# Patient Record
Sex: Female | Born: 1945
Health system: Southern US, Community
[De-identification: ages and names within clinical notes are randomized; demographics above are authoritative.]

## PROBLEM LIST (undated history)

## (undated) DIAGNOSIS — D124 Benign neoplasm of descending colon: Secondary | ICD-10-CM

## (undated) DIAGNOSIS — K8020 Calculus of gallbladder without cholecystitis without obstruction: Secondary | ICD-10-CM

## (undated) DIAGNOSIS — I5032 Chronic diastolic (congestive) heart failure: Secondary | ICD-10-CM

## (undated) DIAGNOSIS — K253 Acute gastric ulcer without hemorrhage or perforation: Secondary | ICD-10-CM

## (undated) DIAGNOSIS — T8859XA Other complications of anesthesia, initial encounter: Secondary | ICD-10-CM

## (undated) DIAGNOSIS — I5189 Other ill-defined heart diseases: Secondary | ICD-10-CM

## (undated) DIAGNOSIS — K429 Umbilical hernia without obstruction or gangrene: Secondary | ICD-10-CM

## (undated) DIAGNOSIS — Z8719 Personal history of other diseases of the digestive system: Secondary | ICD-10-CM

## (undated) DIAGNOSIS — I428 Other cardiomyopathies: Secondary | ICD-10-CM

## (undated) DIAGNOSIS — E669 Obesity, unspecified: Secondary | ICD-10-CM

## (undated) DIAGNOSIS — M1990 Unspecified osteoarthritis, unspecified site: Secondary | ICD-10-CM

## (undated) DIAGNOSIS — T753XXA Motion sickness, initial encounter: Secondary | ICD-10-CM

## (undated) DIAGNOSIS — R7611 Nonspecific reaction to tuberculin skin test without active tuberculosis: Secondary | ICD-10-CM

## (undated) DIAGNOSIS — M8580 Other specified disorders of bone density and structure, unspecified site: Secondary | ICD-10-CM

## (undated) DIAGNOSIS — I509 Heart failure, unspecified: Secondary | ICD-10-CM

## (undated) DIAGNOSIS — R112 Nausea with vomiting, unspecified: Secondary | ICD-10-CM

## (undated) DIAGNOSIS — I447 Left bundle-branch block, unspecified: Secondary | ICD-10-CM

## (undated) DIAGNOSIS — N63 Unspecified lump in unspecified breast: Secondary | ICD-10-CM

## (undated) DIAGNOSIS — I340 Nonrheumatic mitral (valve) insufficiency: Secondary | ICD-10-CM

## (undated) DIAGNOSIS — Z87891 Personal history of nicotine dependence: Secondary | ICD-10-CM

## (undated) DIAGNOSIS — I10 Essential (primary) hypertension: Secondary | ICD-10-CM

## (undated) DIAGNOSIS — Z9889 Other specified postprocedural states: Secondary | ICD-10-CM

## (undated) DIAGNOSIS — M349 Systemic sclerosis, unspecified: Secondary | ICD-10-CM

## (undated) DIAGNOSIS — I351 Nonrheumatic aortic (valve) insufficiency: Secondary | ICD-10-CM

## (undated) DIAGNOSIS — Z803 Family history of malignant neoplasm of breast: Secondary | ICD-10-CM

## (undated) DIAGNOSIS — T4145XA Adverse effect of unspecified anesthetic, initial encounter: Secondary | ICD-10-CM

## (undated) HISTORY — DX: Nonspecific reaction to tuberculin skin test without active tuberculosis: R76.11

## (undated) HISTORY — DX: Unspecified lump in unspecified breast: N63.0

## (undated) HISTORY — DX: Obesity, unspecified: E66.9

## (undated) HISTORY — DX: Personal history of nicotine dependence: Z87.891

## (undated) HISTORY — DX: Systemic sclerosis, unspecified: M34.9

## (undated) HISTORY — PX: ERCP W/ SPHICTEROTOMY: SHX1523

## (undated) HISTORY — DX: Other specified disorders of bone density and structure, unspecified site: M85.80

## (undated) HISTORY — DX: Family history of malignant neoplasm of breast: Z80.3

## (undated) HISTORY — DX: Essential (primary) hypertension: I10

---
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
## 1966-11-29 HISTORY — PX: TONSILLECTOMY: SUR1361

## 1972-11-29 HISTORY — PX: TUBAL LIGATION: SHX77

## 2004-06-29 HISTORY — PX: COLONOSCOPY: SHX174

## 2004-06-29 LAB — HM COLONOSCOPY: HM Colonoscopy: NORMAL

## 2005-06-09 ENCOUNTER — Ambulatory Visit: Payer: Self-pay | Admitting: Obstetrics and Gynecology

## 2006-03-04 LAB — FECAL OCCULT BLOOD, GUAIAC: Fecal Occult Blood: NEGATIVE

## 2006-03-30 ENCOUNTER — Ambulatory Visit: Payer: Self-pay | Admitting: Family Medicine

## 2006-06-10 ENCOUNTER — Ambulatory Visit: Payer: Self-pay | Admitting: Family Medicine

## 2006-07-04 ENCOUNTER — Ambulatory Visit: Payer: Self-pay | Admitting: Family Medicine

## 2006-07-04 ENCOUNTER — Other Ambulatory Visit: Admission: RE | Admit: 2006-07-04 | Discharge: 2006-07-04 | Payer: Self-pay | Admitting: Family Medicine

## 2006-07-04 ENCOUNTER — Encounter: Payer: Self-pay | Admitting: Family Medicine

## 2006-07-04 LAB — CONVERTED CEMR LAB: Pap Smear: NORMAL

## 2006-08-04 ENCOUNTER — Ambulatory Visit: Payer: Self-pay | Admitting: Family Medicine

## 2006-09-16 ENCOUNTER — Ambulatory Visit: Payer: Self-pay | Admitting: Family Medicine

## 2007-06-27 ENCOUNTER — Encounter: Payer: Self-pay | Admitting: Family Medicine

## 2007-06-27 DIAGNOSIS — M810 Age-related osteoporosis without current pathological fracture: Secondary | ICD-10-CM | POA: Insufficient documentation

## 2007-06-27 DIAGNOSIS — F17200 Nicotine dependence, unspecified, uncomplicated: Secondary | ICD-10-CM | POA: Insufficient documentation

## 2007-06-27 HISTORY — DX: Age-related osteoporosis without current pathological fracture: M81.0

## 2007-07-17 ENCOUNTER — Ambulatory Visit: Payer: Self-pay | Admitting: Family Medicine

## 2007-08-10 ENCOUNTER — Ambulatory Visit: Payer: Self-pay | Admitting: Family Medicine

## 2007-08-10 ENCOUNTER — Encounter: Payer: Self-pay | Admitting: Family Medicine

## 2007-08-14 ENCOUNTER — Encounter (INDEPENDENT_AMBULATORY_CARE_PROVIDER_SITE_OTHER): Payer: Self-pay | Admitting: *Deleted

## 2007-09-01 ENCOUNTER — Telehealth: Payer: Self-pay | Admitting: Family Medicine

## 2007-09-07 ENCOUNTER — Ambulatory Visit: Payer: Self-pay | Admitting: Oncology

## 2007-09-18 ENCOUNTER — Encounter: Payer: Self-pay | Admitting: Family Medicine

## 2007-09-18 LAB — COMPREHENSIVE METABOLIC PANEL
ALT: 23 U/L (ref 0–35)
AST: 25 U/L (ref 0–37)
Albumin: 4 g/dL (ref 3.5–5.2)
Alkaline Phosphatase: 67 U/L (ref 39–117)
BUN: 10 mg/dL (ref 6–23)
CO2: 31 mEq/L (ref 19–32)
Calcium: 9.9 mg/dL (ref 8.4–10.5)
Chloride: 103 mEq/L (ref 96–112)
Creatinine, Ser: 0.76 mg/dL (ref 0.40–1.20)
Glucose, Bld: 98 mg/dL (ref 70–99)
Potassium: 3.9 mEq/L (ref 3.5–5.3)
Sodium: 141 mEq/L (ref 135–145)
Total Bilirubin: 0.6 mg/dL (ref 0.3–1.2)
Total Protein: 6.8 g/dL (ref 6.0–8.3)

## 2007-09-18 LAB — CBC WITH DIFFERENTIAL (CANCER CENTER ONLY)
BASO#: 0 10*3/uL (ref 0.0–0.2)
BASO%: 0.6 % (ref 0.0–2.0)
EOS%: 1.3 % (ref 0.0–7.0)
Eosinophils Absolute: 0.1 10*3/uL (ref 0.0–0.5)
HCT: 40.8 % (ref 34.8–46.6)
HGB: 13.8 g/dL (ref 11.6–15.9)
LYMPH#: 2 10*3/uL (ref 0.9–3.3)
LYMPH%: 40 % (ref 14.0–48.0)
MCH: 31.5 pg (ref 26.0–34.0)
MCHC: 33.9 g/dL (ref 32.0–36.0)
MCV: 93 fL (ref 81–101)
MONO#: 0.3 10*3/uL (ref 0.1–0.9)
MONO%: 6.4 % (ref 0.0–13.0)
NEUT#: 2.6 10*3/uL (ref 1.5–6.5)
NEUT%: 51.7 % (ref 39.6–80.0)
Platelets: 243 10*3/uL (ref 145–400)
RBC: 4.38 10*6/uL (ref 3.70–5.32)
RDW: 12.7 % (ref 10.5–14.6)
WBC: 5 10*3/uL (ref 3.9–10.0)

## 2007-09-22 LAB — HYPERCOAGULABLE PANEL, COMPREHENSIVE
AntiThromb III Func: 129 % — ABNORMAL HIGH (ref 76–126)
Anticardiolipin IgA: 16 [APL'U] (ref <13)
Anticardiolipin IgG: <7 [GPL'U] (ref <11)
Anticardiolipin IgM: 10 [MPL'U] (ref <10)
Beta-2 Glyco I IgG: <4 U/mL (ref <20)
Beta-2-Glycoprotein I IgA: <4 U/mL (ref <10)
Beta-2-Glycoprotein I IgM: <4 U/mL (ref <10)
DRVVT: 42.2 secs (ref 36.1–47.0)
Homocysteine: 6.1 umol/L (ref 4.0–15.4)
PTT Lupus Anticoagulant: 38.7 secs (ref 36.3–48.8)
Protein C Activity: 168 % — ABNORMAL HIGH (ref 75–133)
Protein C, Total: 104 % (ref 70–140)
Protein S Activity: 99 % (ref 69–129)
Protein S Ag, Total: 137 % (ref 70–140)

## 2007-10-10 ENCOUNTER — Encounter: Payer: Self-pay | Admitting: Family Medicine

## 2007-10-13 LAB — ANTITHROMBIN III: AntiThromb III Func: 120 % (ref 76–126)

## 2007-10-13 LAB — CARDIOLIPIN ANTIBODIES, IGG, IGM, IGA
Anticardiolipin IgA: 8 [APL'U] (ref <13)
Anticardiolipin IgG: <7 [GPL'U] (ref <11)
Anticardiolipin IgM: 11 [MPL'U] (ref <10)

## 2007-10-13 LAB — PROTEIN C ACTIVITY: Protein C Activity: 141 % — ABNORMAL HIGH (ref 75–133)

## 2007-12-08 ENCOUNTER — Telehealth: Payer: Self-pay | Admitting: Family Medicine

## 2007-12-12 ENCOUNTER — Ambulatory Visit: Payer: Self-pay | Admitting: Family Medicine

## 2008-07-30 ENCOUNTER — Ambulatory Visit: Payer: Self-pay | Admitting: Family Medicine

## 2008-07-30 DIAGNOSIS — L909 Atrophic disorder of skin, unspecified: Secondary | ICD-10-CM | POA: Insufficient documentation

## 2008-07-30 DIAGNOSIS — L919 Hypertrophic disorder of the skin, unspecified: Secondary | ICD-10-CM

## 2008-07-31 ENCOUNTER — Encounter: Payer: Self-pay | Admitting: Family Medicine

## 2008-08-01 LAB — CONVERTED CEMR LAB
ALT: 17 units/L (ref 0–35)
AST: 21 units/L (ref 0–37)
Albumin: 3.9 g/dL (ref 3.5–5.2)
Alkaline Phosphatase: 79 units/L (ref 39–117)
BUN: 18 mg/dL (ref 6–23)
Basophils Absolute: 0 10*3/uL (ref 0.0–0.1)
Basophils Relative: 0.3 % (ref 0.0–3.0)
Bilirubin, Direct: 0.1 mg/dL (ref 0.0–0.3)
CO2: 31 meq/L (ref 19–32)
Calcium: 9.1 mg/dL (ref 8.4–10.5)
Chloride: 110 meq/L (ref 96–112)
Cholesterol: 209 mg/dL (ref 0–200)
Creatinine, Ser: 0.8 mg/dL (ref 0.4–1.2)
Direct LDL: 100.2 mg/dL
Eosinophils Absolute: 0.1 10*3/uL (ref 0.0–0.7)
Eosinophils Relative: 1.1 % (ref 0.0–5.0)
GFR calc Af Amer: 94 mL/min
GFR calc non Af Amer: 78 mL/min
Glucose, Bld: 93 mg/dL (ref 70–99)
HCT: 42.3 % (ref 36.0–46.0)
HDL: 62.5 mg/dL (ref >39.0)
Hemoglobin: 14.9 g/dL (ref 12.0–15.0)
Lymphocytes Relative: 29.9 % (ref 12.0–46.0)
MCHC: 35.2 g/dL (ref 30.0–36.0)
MCV: 93.5 fL (ref 78.0–100.0)
Monocytes Absolute: 0.5 10*3/uL (ref 0.1–1.0)
Monocytes Relative: 8.2 % (ref 3.0–12.0)
Neutro Abs: 3.5 10*3/uL (ref 1.4–7.7)
Neutrophils Relative %: 60.5 % (ref 43.0–77.0)
Platelets: 263 10*3/uL (ref 150–400)
Potassium: 4 meq/L (ref 3.5–5.1)
RBC: 4.53 M/uL (ref 3.87–5.11)
RDW: 13 % (ref 11.5–14.6)
Sodium: 143 meq/L (ref 135–145)
TSH: 0.93 microintl units/mL (ref 0.35–5.50)
Total Bilirubin: 0.7 mg/dL (ref 0.3–1.2)
Total CHOL/HDL Ratio: 3.3
Total Protein: 6.8 g/dL (ref 6.0–8.3)
Triglycerides: 53 mg/dL (ref 0–149)
VLDL: 11 mg/dL (ref 0–40)
Vit D, 1,25-Dihydroxy: 33 (ref 30–89)
WBC: 5.9 10*3/uL (ref 4.5–10.5)

## 2008-08-15 ENCOUNTER — Encounter: Payer: Self-pay | Admitting: Family Medicine

## 2008-08-15 ENCOUNTER — Ambulatory Visit: Payer: Self-pay | Admitting: Family Medicine

## 2008-11-29 HISTORY — PX: BUNIONECTOMY: SHX129

## 2009-08-19 ENCOUNTER — Encounter: Payer: Self-pay | Admitting: Family Medicine

## 2009-08-19 ENCOUNTER — Ambulatory Visit: Payer: Self-pay | Admitting: Family Medicine

## 2009-08-19 ENCOUNTER — Other Ambulatory Visit: Admission: RE | Admit: 2009-08-19 | Discharge: 2009-08-19 | Payer: Self-pay | Admitting: Family Medicine

## 2009-08-20 LAB — CONVERTED CEMR LAB
ALT: 17 units/L (ref 0–35)
AST: 21 units/L (ref 0–37)
Albumin: 3.9 g/dL (ref 3.5–5.2)
Alkaline Phosphatase: 72 units/L (ref 39–117)
BUN: 16 mg/dL (ref 6–23)
Basophils Absolute: 0 10*3/uL (ref 0.0–0.1)
Basophils Relative: 0.2 % (ref 0.0–3.0)
Bilirubin, Direct: 0 mg/dL (ref 0.0–0.3)
CO2: 34 meq/L — ABNORMAL HIGH (ref 19–32)
Calcium: 9.5 mg/dL (ref 8.4–10.5)
Chloride: 106 meq/L (ref 96–112)
Cholesterol: 173 mg/dL (ref 0–200)
Creatinine, Ser: 0.9 mg/dL (ref 0.4–1.2)
Eosinophils Absolute: 0.1 10*3/uL (ref 0.0–0.7)
Eosinophils Relative: 0.8 % (ref 0.0–5.0)
GFR calc non Af Amer: 67.27 mL/min (ref >60)
Glucose, Bld: 68 mg/dL — ABNORMAL LOW (ref 70–99)
HCT: 41.1 % (ref 36.0–46.0)
HDL: 52.1 mg/dL (ref >39.00)
Hemoglobin: 14.2 g/dL (ref 12.0–15.0)
LDL Cholesterol: 108 mg/dL — ABNORMAL HIGH (ref 0–99)
Lymphocytes Relative: 29 % (ref 12.0–46.0)
Lymphs Abs: 1.9 10*3/uL (ref 0.7–4.0)
MCHC: 34.4 g/dL (ref 30.0–36.0)
MCV: 96.3 fL (ref 78.0–100.0)
Monocytes Absolute: 0.5 10*3/uL (ref 0.1–1.0)
Monocytes Relative: 8.2 % (ref 3.0–12.0)
Neutro Abs: 4 10*3/uL (ref 1.4–7.7)
Neutrophils Relative %: 61.8 % (ref 43.0–77.0)
Platelets: 218 10*3/uL (ref 150.0–400.0)
Potassium: 4.2 meq/L (ref 3.5–5.1)
RBC: 4.27 M/uL (ref 3.87–5.11)
RDW: 12.4 % (ref 11.5–14.6)
Sodium: 144 meq/L (ref 135–145)
TSH: 0.94 microintl units/mL (ref 0.35–5.50)
Total Bilirubin: 0.8 mg/dL (ref 0.3–1.2)
Total CHOL/HDL Ratio: 3
Total Protein: 6.9 g/dL (ref 6.0–8.3)
Triglycerides: 67 mg/dL (ref 0.0–149.0)
VLDL: 13.4 mg/dL (ref 0.0–40.0)
Vit D, 25-Hydroxy: 40 ng/mL (ref 30–89)
WBC: 6.5 10*3/uL (ref 4.5–10.5)

## 2009-08-21 ENCOUNTER — Encounter (INDEPENDENT_AMBULATORY_CARE_PROVIDER_SITE_OTHER): Payer: Self-pay | Admitting: *Deleted

## 2009-08-27 ENCOUNTER — Ambulatory Visit: Payer: Self-pay | Admitting: Family Medicine

## 2009-08-27 ENCOUNTER — Encounter: Payer: Self-pay | Admitting: Family Medicine

## 2009-08-27 LAB — HM MAMMOGRAPHY: HM Mammogram: NEGATIVE

## 2009-08-28 ENCOUNTER — Encounter (INDEPENDENT_AMBULATORY_CARE_PROVIDER_SITE_OTHER): Payer: Self-pay | Admitting: *Deleted

## 2010-01-09 ENCOUNTER — Encounter: Payer: Self-pay | Admitting: Family Medicine

## 2010-03-11 ENCOUNTER — Telehealth: Payer: Self-pay | Admitting: Family Medicine

## 2010-07-31 ENCOUNTER — Encounter: Payer: Self-pay | Admitting: Family Medicine

## 2010-08-20 ENCOUNTER — Ambulatory Visit: Payer: Self-pay | Admitting: Family Medicine

## 2010-08-29 LAB — HM DEXA SCAN

## 2010-09-15 ENCOUNTER — Ambulatory Visit: Payer: Self-pay | Admitting: Family Medicine

## 2010-09-15 ENCOUNTER — Encounter: Payer: Self-pay | Admitting: Family Medicine

## 2010-09-15 DIAGNOSIS — R928 Other abnormal and inconclusive findings on diagnostic imaging of breast: Secondary | ICD-10-CM | POA: Insufficient documentation

## 2010-09-28 ENCOUNTER — Ambulatory Visit: Payer: Self-pay | Admitting: Family Medicine

## 2010-09-28 ENCOUNTER — Encounter: Payer: Self-pay | Admitting: Family Medicine

## 2010-09-29 HISTORY — PX: BREAST BIOPSY: SHX20

## 2010-10-02 ENCOUNTER — Encounter: Payer: Self-pay | Admitting: Family Medicine

## 2010-10-02 ENCOUNTER — Telehealth: Payer: Self-pay | Admitting: Family Medicine

## 2010-11-02 ENCOUNTER — Encounter: Payer: Self-pay | Admitting: Family Medicine

## 2010-11-04 ENCOUNTER — Encounter: Payer: Self-pay | Admitting: Family Medicine

## 2010-11-05 ENCOUNTER — Ambulatory Visit: Payer: Self-pay | Admitting: General Surgery

## 2010-11-12 ENCOUNTER — Encounter: Payer: Self-pay | Admitting: Family Medicine

## 2010-12-29 NOTE — Letter (Signed)
Summary: Generic Letter  [facility]  [address] [place], [place] [address]   Phone: [phone]  Fax: [phone]    08/28/2009    [facility] [address] [place], [place]  [address]    Dear Valerie Curry,   Your mammogram was normal, please repeat screening in one year.    Sincerely,   Liane Comber CMA (AAMA)

## 2010-12-29 NOTE — Progress Notes (Signed)
Summary: Dexa report  Phone Note Call from Patient   Caller: Patient Summary of Call: Patient called to ask about her Bone Density test results? Please call the patient at work#[phone].  Also she went to Dr Evette Cristal surgeon today and he did a Core Biopsy on her today. She will let you know next week the results.  Initial call taken by: Carlton Adam,  October 02, 2010 4:25 PM  Follow-up for Phone Call        I do not see a dexa in my in boxes or desk top -- let her know I do not have the result yet and please send for it if ready  Additional Follow-up for Phone Call Additional follow up Details #1::        Spoke with Herbert Seta at [facility] and pt had Dexa on 09/15/10 and Herbert Seta will fax report now.Lewanda Rife LPN  October 05, 2010 8:53 AM   Copy of Dexa report dated 09/15/10 is on your shelf in the in box.Lewanda Rife LPN  October 05, 2010 9:52 AM     Additional Follow-up for Phone Call Additional follow up Details #2::    please let pt know that I got her report - finally  her scores are in osteopenia range-- worse in the spine than last time  I recommend she continue the boniva and ca and D  we will want to re check it in 2 years Follow-up by: Judith Part MD,  October 05, 2010 11:28 AM  Additional Follow-up for Phone Call Additional follow up Details #3:: Details for Additional Follow-up Action Taken: Patient notified as instructed by telephone. Pt wanted to let Dr Milinda Antis know she has not taken Boniva since 2009. Pt presently taking Vitamin D 2000 international units daily and Calcium 1000mg  daily. Please advise. Lewanda Rife LPN  October 05, 2010 2:52 PM   sorry - for some reason I thought she had re-started it  how many years was she on it (or other OP meds)? Additional Follow-up by: Judith Part MD,  October 05, 2010 3:23 PM   Patient notified as instructed by telephone. Pt said Boniva was the only OP med she has tried and she thinks she took that for aobut 1 1/2  years. Pt said when she sees Dr Milinda Antis next time she may talk with her about trying something else and in the meantime will continue her Vit D and Calcium.Lewanda Rife LPN  October 05, 2010 3:51 PM  Preventive Care Screening  T-score L femur:    Date:  09/15/2010    Results:  -1.8   T-score L-Spine:    Date:  09/15/2010    Results:  -2.2   Bone Density:    Date:  09/15/2010    Results:  osteopenia std dev   Past Medical History:    Osteopenia  (took boniva 11/2 years in past) - does not want to re start it     family hx of hypercoaglability    negative heme w/u for hypercoag state in 2/09

## 2010-12-29 NOTE — Consult Note (Signed)
Summary: [facility]/Office Progress Note/Dr. Caryl Ada [facility]/Office Progress Note/Dr. Welton Flakes   Imported By: Mickle Asper 02/02/2008 12:47:53  _____________________________________________________________________  External Attachment:    Type:   Image     Comment:   External Document  Appended Document: [facility]/Office Progress Note/Dr. Welton Flakes    Clinical Lists Changes  Observations: Added new observation of PAST MED HX: Osteopenia family hx of hypercoaglability negative heme w/u for hypercoag state in 2/09  (02/03/2008 23:23)        Past Medical History:    Osteopenia    family hx of hypercoaglability    negative heme w/u for hypercoag state in 2/09

## 2010-12-29 NOTE — Assessment & Plan Note (Signed)
Summary: CPX/DLO   Vital Signs:  Patient profile:   65 year old female Height:      62.5 inches Weight:      177.75 pounds BMI:     32.11 Temp:     98.4 degrees F oral Pulse rate:   72 / minute Pulse rhythm:   regular BP sitting:   132 / 86  (left arm) Cuff size:   regular  Vitals Entered By: Lewanda Rife LPN (August 20, 2010 9:51 AM)  Serial Vital Signs/Assessments:  Time      Position  BP       Pulse  Resp  Temp     By                     130/80                         Judith Part MD  CC: CPX    History of Present Illness: here for wellness exam and to disc chronic health problems   wt is up 9 lb  is feeling good and doing well   132/86 bp today on first check  osteopenia 9/09- vit D50 calcium and vitamin D - is good about taking  due for  re check   colonosc nl in 05  smoker- still smoking  is not ready to quit and she knows the risks   pap 9/10- never had abn one  last 3 paps have been normal    mam 9/10 self exam --no lumps   lipids good with trig 80 and HDL 64 and LDL 117  Td 05 pneumovax 07 flu shot  zostavax 09    Allergies (verified): No Known Drug Allergies  Past History:  Past Medical History: Last updated: 02/03/2008 Osteopenia family hx of hypercoaglability negative heme w/u for hypercoag state in 2/09  Past Surgical History: Last updated: 06/27/2007 Appendectomy Tonsillectomy Colonoscopy (06/2004) Bunionectomy Dexa- osteopenia TS -1.71, FN -1.70 (08/2006) Positive PPD  Family History: Last updated: 07/30/2008 Father: lung cancer  Mother: deceased- heart disease HTN Siblings:  aunts with breast cancer brother - blood clots   Social History: Last updated: 08/19/2009 Marital Status: Married Children: 2 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 Eyes:  Denies blurring and eye irritation. CV:  Denies chest pain or discomfort, lightheadness, palpitations, shortness of breath with exertion, and swelling of feet. Resp:  Denies cough, shortness of breath, and wheezing. GI:  Denies abdominal pain, change in bowel habits, and indigestion. GU:  Denies dysuria and urinary frequency. MS:  Denies joint pain, joint redness, and joint swelling. Derm:  Denies itching, lesion(s), poor wound healing, and rash. Neuro:  Denies numbness and tingling. Psych:  Denies anxiety and depression. Endo:  Denies cold intolerance, excessive thirst, excessive urination, and heat intolerance. Heme:  Denies abnormal bruising and bleeding.  Physical Exam  General:  Well-developed,well-nourished,in no acute distress; alert,appropriate and cooperative throughout examination Head:  normocephalic, atraumatic, and no abnormalities observed.   Eyes:  vision grossly intact, pupils equal, pupils round, and pupils reactive to light.  no conjunctival pallor, injection or icterus  Ears:  R ear normal and L ear normal.   Nose:  no nasal discharge.   Mouth:  pharynx pink and moist.   Neck:  supple with full rom and no masses or thyromegally, no JVD or carotid bruit  Chest Wall:  No deformities, masses, or tenderness noted. Breasts:  No mass, nodules, thickening, tenderness, bulging, retraction, inflamation, nipple discharge or skin changes noted.   Lungs:  diffusely distant bs with good air exch no rales or crackles  Heart:  Normal rate and regular rhythm. S1 and S2 normal without gallop, murmur, click, rub or other extra sounds. Abdomen:  Bowel sounds positive,abdomen soft and non-tender without masses, organomegaly or hernias noted.  Msk:  No deformity or scoliosis noted of thoracic or lumbar spine.  no acute joint changes  Pulses:  R and L carotid,radial,femoral,dorsalis pedis and posterior tibial pulses are full and equal bilaterally Extremities:  No  clubbing, cyanosis, edema, or deformity noted with normal full range of motion of all joints.   Neurologic:  sensation intact to light touch, gait normal, and DTRs symmetrical and normal.   Skin:  Intact without suspicious lesions or rashes Cervical Nodes:  No lymphadenopathy noted Axillary Nodes:  No palpable lymphadenopathy Inguinal Nodes:  No significant adenopathy Psych:  normal affect, talkative and pleasant    Impression & Recommendations:  Problem # 1:  HEALTH MAINTENANCE EXAM (ICD-V70.0) Assessment Comment Only reviewed health habits including diet, exercise and skin cancer prevention reviewed health maintenance list and family history rev labs disc smoking cessation  Problem # 2:  OSTEOPENIA (ICD-733.90) Assessment: Unchanged due for dexa  disc exercise and ca and D D level is good Her updated medication list for this problem includes:    Vitamin D 2000 Unit Tabs (Cholecalciferol) .Marland Kitchen... Take 1 tablet by mouth once a day  Orders: Radiology Referral (Radiology)  Problem # 3:  TOBACCO ABUSE (ICD-305.1) Assessment: Unchanged discussed in detail risks of smoking, and possible outcomes including COPD, vascular dz, cancer and also respiratory infections/sinus problems  pt not interested in quitting yet imms up to date  Complete Medication List: 1)  Aspirin 81 Mg Tabs (Aspirin) .... Take 1 tablet by mouth once a day 2)  Vitamin D 2000 Unit Tabs (Cholecalciferol) .... Take 1 tablet by mouth once a day 3)  Vitamin C 1000 Mg Tabs (Ascorbic acid) .... Take 1 tablet by mouth once a day 4)  Calcium 1000 Mg  .... Take 1 tablet by mouth once a day  Other Orders: Admin of Therapeutic Inj  intramuscular or subcutaneous ([address]) Flu Vaccine 37yrs + (29528)  Patient Instructions: 1)  you can raise your HDL (good cholesterol) by increasing exercise and eating omega 3 fatty acid supplement like fish oil or flax seed oil over the counter 2)  you can lower LDL (bad cholesterol) by  limiting saturated fats in diet like red meat, fried foods, egg yolks, fatty breakfast meats, high fat dairy products and shellfish  3)  we will schedule mammogram and bone density at check out  4)  the current recommendation for calcium intake is 1200-1500 mg daily with 1000 IU of vitamin D   Current Allergies (reviewed today): No known allergies     Influenza Immunization History:    Influenza # 1:  Fluvax 3+ (07/30/2010)     Appended Document: CPX/DLO I received a flag from Patricia Pesa re vaccination code. I called pt to verify but pt did not receive immunization or an injection when she was here on 08/20/10. Was entered into immunization record about pt receiving flu shot on 07/30/10 as an historical injection. Pt should not be charged for injection on 08/20/10. Can  you please remove the admin and flu vaccine charge [address] for $22.00 and 96471 for $42.00. I will respond to Matthew's flag and request he take off these charges.

## 2010-12-29 NOTE — Progress Notes (Signed)
Summary: wants order for labs  Phone Note Call from Patient Call back at Work Phone [phone]   Caller: Patient Call For: Judith Part MD Summary of Call: Pt has appt for a physical in september and she is asking if she can have an order for labs to be done prior to that appt.  She will have them done where she works.  Please call when ready. Initial call taken by: Lowella Petties CMA,  March 11, 2010 3:39 PM  Follow-up for Phone Call        here is order on px pad for labs  comp met, cbc with diff, tsh, lipid/ vit D  Follow-up by: Judith Part MD,  March 11, 2010 4:52 PM  Additional Follow-up for Phone Call Additional follow up Details #1::        Left message at pt's home for patient to call back. Lewanda Rife LPN  March 11, 2010 5:05 PM   Pt wants order faxed to her at [phone].  She said this fax goes directly to her, no one else will see it.  Lowella Petties CMA  March 12, 2010 8:23 AM  Lab order faxed to [phone] as instructed.Lewanda Rife LPN  March 12, 2010 9:02 AM

## 2010-12-29 NOTE — Progress Notes (Signed)
Summary: pt request call  Phone Note Call from Patient Call back at [facility] Phone [phone] Call back at Work Phone [phone]   Caller: Patient Call For: Joselyne Spake Summary of Call: pt has been having rx's to boniva for past 3 months wants to discuss with you. Request your call Initial call taken by: Liane Comber,  December 08, 2007 11:48 AM  Follow-up for Phone Call        please find out what symptoms she is having- and tell her to hold med for now Follow-up by: Judith Part MD,  December 11, 2007 1:55 PM  Additional Follow-up for Phone Call Additional follow up Details #1::        had vomiting the day she took it in oct, had nausea the day she took it in nov, had diarrhea the day she took it in dec, won't take it this month Additional Follow-up by: Lowella Petties,  December 11, 2007 3:19 PM    Additional Follow-up for Phone Call Additional follow up Details #2::    hold it for 2 months and then update me- this could be side effect from med Follow-up by: Judith Part MD,  December 11, 2007 3:29 PM  Additional Follow-up for Phone Call Additional follow up Details #3:: Details for Additional Follow-up Action Taken: advised pt Additional Follow-up by: Lowella Petties,  December 11, 2007 4:28 PM

## 2010-12-29 NOTE — Assessment & Plan Note (Signed)
Summary: PAP SMEAR AND CPX/CLE   Vital Signs:  Patient Profile:   65 Years Old Female Height:     61.5 inches Weight:      182 pounds Temp:     97.9 degrees F oral Pulse rate:   76 / minute Pulse rhythm:   regular BP sitting:   140 / 88  (left arm) Cuff size:   regular  Vitals Entered By: Liane Comber (July 30, 2008 9:50 AM)                 Chief Complaint:  cpx.  History of Present Illness: has been fine- a good year   hematology cleared her for any clotting disorder   still smokes 1ppd -- not ready to quit yet   is due for dexa -- has not taken boniva for a while  stopped it when she was sick - vomiting and dizziness -- 3 months in a row  (right after her dose ) has been off of it   is taking calcium and vit D  no fam hx of OP  utd on pneumovax and had shingles vaccine too   no gyn sympt- pap in 07, and no new partners, and no hx of abn paps   colonosc was nl 05   irritated skin tag under R breast - turned black , about to ? fall off --- after wearing underwire bra         Current Allergies (reviewed today): No known allergies   Past Medical History:    Reviewed history from 02/03/2008 and no changes required:       Osteopenia       family hx of hypercoaglability       negative heme w/u for hypercoag state in 2/09  Past Surgical History:    Reviewed history from 06/27/2007 and no changes required:       Appendectomy       Tonsillectomy       Colonoscopy (06/2004)       Bunionectomy       Dexa- osteopenia TS -1.71, FN -1.70 (08/2006)       Positive PPD   Family History:    Reviewed history from 07/17/2007 and no changes required:       Father: lung cancer        Mother: deceased- heart disease HTN       Siblings:        aunts with breast cancer       brother - blood clots   Social History:    Reviewed history from 06/27/2007 and no changes required:       Marital Status: Married       Children: 2       Occupation: Chief Executive Officer       smoking 1ppd        works out at [employer] 3 days per week     Review of Systems  General      Denies fatigue, fever, loss of appetite, and malaise.  Eyes      Denies blurring and eye pain.  ENT      Denies ear discharge and earache.  CV      Denies chest pain or discomfort, palpitations, shortness of breath with exertion, and swelling of feet.  Resp      Denies cough, shortness of breath, and wheezing.  GI      Denies abdominal pain, bloody stools, and change in bowel habits.  GU      Denies discharge, dysuria, and incontinence.  MS      Denies joint pain, joint redness, and joint swelling.  Derm      Complains of lesion(s).      Denies itching and rash.  Neuro      Denies numbness and tingling.  Psych      mood has been good   Endo      Denies excessive thirst and excessive urination.  Heme      Denies abnormal bruising.   Physical Exam  General:     overwt but well appearing Head:     normocephalic, atraumatic, and no abnormalities observed.   Eyes:     vision grossly intact, pupils equal, pupils round, and pupils reactive to light.  no conjunctival pallor, injection or icterus  Ears:     R ear normal and L ear normal.   Nose:     no nasal discharge.   Mouth:     pharynx pink and moist.   Neck:     supple with full rom and no masses or thyromegally, no JVD or carotid bruit  Chest Wall:     No deformities, masses, or tenderness noted. Breasts:     No mass, nodules, thickening, tenderness, bulging, retraction, inflamation, nipple discharge or skin changes noted.   Lungs:     distant bs but no rales, rhonchi or wheeze Heart:     Normal rate and regular rhythm. S1 and S2 normal without gallop, murmur, click, rub or other extra sounds. Abdomen:     Bowel sounds positive,abdomen soft and non-tender without masses, organomegaly or hernias noted.  no renal bruits  Msk:     No deformity or scoliosis noted of thoracic or lumbar spine.    no acute joint changes Pulses:     R and L carotid,radial,femoral,dorsalis pedis and posterior tibial pulses are full and equal bilaterally Extremities:     No clubbing, cyanosis, edema, or deformity noted with normal full range of motion of all joints.   Neurologic:     sensation intact to light touch, gait normal, and DTRs symmetrical and normal.   Skin:     small 2mm flesh colored tag that is irritated under R breast cleaned in sterile fashoin and clipped -- dressed with band aid and abx ointment Cervical Nodes:     No lymphadenopathy noted Axillary Nodes:     No palpable lymphadenopathy Inguinal Nodes:     No significant adenopathy Psych:     normal affect, talkative and pleasant     Impression & Recommendations:  Problem # 1:  HEALTH MAINTENANCE EXAM (ICD-V70.0) Assessment: Comment Only reviewed health habits including diet, exercise and skin cancer prevention reviewed health maintenance list and family history  Orders: Venipuncture (91478) TLB-Lipid Panel (80061-LIPID) TLB-BMP (Basic Metabolic Panel-BMET) (80048-METABOL) TLB-CBC Platelet - w/Differential (85025-CBCD) TLB-Hepatic/Liver Function Pnl (80076-HEPATIC) TLB-TSH (Thyroid Stimulating Hormone) (84443-TSH)   Problem # 2:  OTHER SCREENING MAMMOGRAM (ICD-V76.12) Assessment: Comment Only sched annual mam  enc self breast exams  Orders: Radiology Referral (Radiology)   Problem # 3:  TOBACCO ABUSE (ICD-305.1) Assessment: Unchanged discussed in detail risks of smoking, and possible outcomes including COPD, vascular dz, cancer and also respiratory infections/sinus problems   ? if she is motivated to quit yet -- disc sources for help  Problem # 4:  OSTEOPENIA (ICD-733.90) Assessment: Unchanged off boniva for ? side eff-- may try one more dose just to see ordered dexa vit D level  today adv ca and vit D and exercise and smoking cessation ? consider miacalcin The following medications were removed from the  medication list:    Boniva 150 Mg Tabs (Ibandronate sodium) .Marland Kitchen... Take one by mouth monthly  Orders: Venipuncture ([address]) T-Vitamin D (25-Hydroxy) [phone]) Radiology Referral (Radiology)   Problem # 5:  SKIN TAG (ICD-701.9) Assessment: New irritated skin tag under R breast- clipped sterilly- aftercare discussed  Orders: Removal of Skin Tags up to 15 Lesions (11200)   Complete Medication List: 1)  Asa 81 Mg  .Marland KitchenMarland Kitchen. 1 by mouth qd 2)  Vitamin D  .... Daily 3)  Vitamin C  .... Daily 4)  Calcium With Vit D  .... 1 by mouth two times a day   Patient Instructions: 1)  we will set up mammogram and dexa at check out 2)  the current recommendation for calcium intake is 1200-1500 mg daily with [phone] IU of vitamin D  3)  keep area of skin tag removal clean and dry with antibiotic ointment  4)  work on healthy diet and exercise for general health  5)  work on quitting smoking    ]  Preventive Care Screening  Breast Exam:    Date:  07/30/2008    Results:  Normal

## 2010-12-29 NOTE — Consult Note (Signed)
Summary: [facility]  [facility]   Imported By: Lanelle Bal 10/21/2010 15:10:58  _____________________________________________________________________  External Attachment:    Type:   Image     Comment:   External Document

## 2010-12-29 NOTE — Letter (Signed)
Summary: Oletta Lamas MD  Oletta Lamas MD   Imported By: Lanelle Bal 01/16/2010 12:09:19  _____________________________________________________________________  External Attachment:    Type:   Image     Comment:   External Document

## 2010-12-29 NOTE — Assessment & Plan Note (Signed)
Summary: 8:00 SHINGLES VAC/Valerie Curry/CLE  Nurse Visit    Prior Medications: BONIVA 150 MG  TABS (IBANDRONATE SODIUM) take one by mouth monthly ASA 81 MG () 1 by mouth qd Current Allergies: No known allergies    Zostavax # 1    Vaccine Type: Zostavax    Site: left deltoid    Mfr: Merck    Dose: 0.5 ml    Route: [name]    Given by: Lowella Petties    Exp. Date: 03/26/2009    Lot #: [id number]    VIS given: 09/10/05 given December 12, 2007.   Orders Added: 1)  Zoster (Shingles) Vaccine Live [90736] 2)  Admin 1st Vaccine [email or web address]    ]

## 2010-12-29 NOTE — Progress Notes (Signed)
Summary: [facility]/Hematology/Medical Oncology/Dr. Caryl Ada [facility]/Hematology/Medical Oncology/Dr. Welton Flakes   Imported By: Eleonore Chiquito 10/24/2007 12:37:24  _____________________________________________________________________  External Attachment:    Type:   Image     Comment:   External Document

## 2010-12-29 NOTE — Miscellaneous (Signed)
Summary: mammo results  Clinical Lists Changes  Observations: Added new observation of MAMMO DUE: 07/2008 (08/14/2007 8:19) Added new observation of MAMMOGRAM: normal (08/14/2007 8:19)       Preventive Care Screening  Mammogram:    Date:  08/14/2007    Next Due:  07/2008    Results:  normal

## 2010-12-29 NOTE — Progress Notes (Signed)
Summary: test for blood clots  Phone Note Call from Patient Call back at Work Phone [phone]   Caller: Patient Call For: Nary Sneed Summary of Call: pt wants labs for blood clots? her brotheris in hosp for blood clots in lung, and her mother and father  had blood clots. Brothers dr recommends ?factor v or prothrombin gene labs? pt wants to discuss witg you Initial call taken by: Liane Comber,  September 01, 2007 12:07 PM  Follow-up for Phone Call        I please pull her chart for me, thanks Follow-up by: Judith Part MD,  September 01, 2007 1:23 PM  Additional Follow-up for Phone Call Additional follow up Details #1::        I found it I will place in inbox. ..................................................................Marland KitchenLiane Comber  September 04, 2007 2:47 PM   New Problems: HX, FAMILY, BLOOD DISORDERS NEC (ICD-V18.3)   Additional Follow-up for Phone Call Additional follow up Details #2::    I called and spoke to pt again urged her to quit smoking as this is another risk factor for blood  clots as well I am going to refer her to hematology for work up for poss hypercoagulable state will send message to [facility] Follow-up by: Judith Part MD,  September 04, 2007 5:45 PM  Additional Follow-up for Phone Call Additional follow up Details #3:: Details for Additional Follow-up Action Taken: HEMATOLOGY CONSULT WITH DR [facility] ON 09/18/07 AT 1:00. ...................................................................Carlton Adam  September 08, 2007 9:08 AM  Additional Follow-up by: Carlton Adam,  September 08, 2007 9:08 AM  New Problems: HX, FAMILY, BLOOD DISORDERS NEC (ICD-V18.3)

## 2010-12-31 NOTE — Letter (Signed)
Summary: [facility]   Imported By: Lanelle Bal 11/26/2010 08:11:24  _____________________________________________________________________  External Attachment:    Type:   Image     Comment:   External Document

## 2010-12-31 NOTE — Letter (Signed)
Summary: Request for proof of biopsy/[facility]  Request for proof of biopsy/[facility]   Imported By: Maryln Gottron 11/09/2010 15:27:57  _____________________________________________________________________  External Attachment:    Type:   Image     Comment:   External Document

## 2010-12-31 NOTE — Letter (Signed)
Summary: [facility]  [facility]   Imported By: Lanelle Bal 11/16/2010 12:06:43  _____________________________________________________________________  External Attachment:    Type:   Image     Comment:   External Document

## 2011-05-18 ENCOUNTER — Telehealth: Payer: Self-pay | Admitting: *Deleted

## 2011-05-18 NOTE — Telephone Encounter (Signed)
Left message on machine to call back  

## 2011-05-18 NOTE — Telephone Encounter (Signed)
Order on px pad is in nurse IN box

## 2011-05-18 NOTE — Telephone Encounter (Signed)
Pt is coming in for physical in September and is asking that order for labs be faxed to her at [phone], her private fax number at [facility] [place], she has her labs drawn there.

## 2011-05-18 NOTE — Telephone Encounter (Signed)
Order was faxed to pt's personal fax machine at work, she received it while I was on the phone with her.

## 2011-08-26 ENCOUNTER — Encounter: Payer: Self-pay | Admitting: Family Medicine

## 2011-08-27 ENCOUNTER — Encounter: Payer: Self-pay | Admitting: Family Medicine

## 2011-08-27 ENCOUNTER — Ambulatory Visit (INDEPENDENT_AMBULATORY_CARE_PROVIDER_SITE_OTHER): Payer: BC Managed Care – PPO | Admitting: Family Medicine

## 2011-08-27 VITALS — BP 120/76 | HR 72 | Temp 98.6°F | Ht 62.0 in | Wt 189.5 lb

## 2011-08-27 DIAGNOSIS — M949 Disorder of cartilage, unspecified: Secondary | ICD-10-CM

## 2011-08-27 DIAGNOSIS — Z0000 Encounter for general adult medical examination without abnormal findings: Secondary | ICD-10-CM

## 2011-08-27 DIAGNOSIS — F17200 Nicotine dependence, unspecified, uncomplicated: Secondary | ICD-10-CM

## 2011-08-27 DIAGNOSIS — M899 Disorder of bone, unspecified: Secondary | ICD-10-CM

## 2011-08-27 DIAGNOSIS — R928 Other abnormal and inconclusive findings on diagnostic imaging of breast: Secondary | ICD-10-CM

## 2011-08-27 NOTE — Progress Notes (Signed)
Subjective:    Patient ID: Valerie Curry, female    DOB: Jun 14, 1946, 65 y.o.   MRN: [id number]  HPI Here for annual health mt exam  Feeling ok  Is doing well with diet and exercise  Treadmill / zumba and line dancing    bp good at 120/76- here is good At work -- has been higher - 130s-140s on the top   Wt is up 12 lb with bmi of 34  [facility] - waiting for those results   Pap- ? Last one -- 9/10 nl  No problems No abn paps   Mam 9/10 neg- then abn 2011 - breast bx was ok  Mam sched in nov , Dr Evette Cristal overlooking her care   Flu shot- at work- on Wednesday  Td 05 zostavax 09  colonosc nl 05- with 10 year recal   Smoking - about 1/2 ppd  Not wanting to quit Aware of the risks   Lipids recent ok with LDL of 102 and HDL of 64   Osteopenia  Used to be on boniva - short term and had side effects - n/v/d  Last dexa 10/11 osteopenia  Will get again in 1 year  Is doing well with ca and vit D - 45- good   Patient Active Problem List  Diagnoses  . TOBACCO ABUSE  . SKIN TAG  . OSTEOPENIA  . ABNORMAL MAMMOGRAM  . Routine general medical examination at a health care facility   Past Medical History  Diagnosis Date  . Osteopenia     took Boniva 1 1/2 years in past-doesn't want to restart it  . Positive PPD    Past Surgical History  Procedure Date  . Appendectomy   . Tonsillectomy   . Colonoscopy 8/05  . Bunionectomy    History  Substance Use Topics  . Smoking status: Current Everyday Smoker -- 1.0 packs/day    Types: Cigarettes  . Smokeless tobacco: Not on file  . Alcohol Use: Not on file   Family History  Problem Relation Age of Onset  . Lung cancer Father   . Heart disease Mother   . Hypertension Mother   . Breast cancer      Aunts  . Other Brother     blood clots  . Other      hypercoag-family history   No Known Allergies Current Outpatient Prescriptions on File Prior to Visit  Medication Sig Dispense Refill  .  Ascorbic Acid (VITAMIN C) 1000 MG tablet Take 1,000 mg by mouth daily.        Marland Kitchen aspirin 81 MG tablet Take 81 mg by mouth daily.        . Cholecalciferol (VITAMIN D) 2000 UNITS tablet Take 2,000 Units by mouth daily.        . calcium elemental as carbonate (BARIATRIC TUMS ULTRA) 400 MG tablet Chew 1,000 mg by mouth daily.             Review of Systems Review of Systems  Constitutional: Negative for fever, appetite change, fatigue and unexpected weight change.  Eyes: Negative for pain and visual disturbance.  Respiratory: Negative for cough and shortness of breath.  no wheezing  Cardiovascular: Negative for cp or palpitations    Gastrointestinal: Negative for nausea, diarrhea and constipation.  Genitourinary: Negative for urgency and frequency.  Skin: Negative for pallor or rash   Neurological: Negative for weakness, light-headedness, numbness and headaches.  Hematological: Negative for adenopathy. Does not bruise/bleed easily.  Psychiatric/Behavioral: Negative for dysphoric mood. The patient is not nervous/anxious.          Objective:   Physical Exam  Constitutional: She appears well-developed and well-nourished. No distress.       overwt and well appearing   HENT:  Head: Normocephalic and atraumatic.  Mouth/Throat: Oropharynx is clear and moist.  Eyes: Conjunctivae and EOM are normal. Pupils are equal, round, and reactive to light.  Neck: Normal range of motion. Neck supple. No JVD present. Carotid bruit is not present. No thyromegaly present.  Cardiovascular: Normal rate, regular rhythm, normal heart sounds and intact distal pulses.  Exam reveals no gallop.   Pulmonary/Chest: Effort normal and breath sounds normal. No respiratory distress. She has no wheezes. She exhibits no tenderness.       Diffusely distant bs   Abdominal: Soft. Bowel sounds are normal. She exhibits no distension, no abdominal bruit and no mass. There is no tenderness.  Genitourinary: No breast swelling,  tenderness, discharge or bleeding.  Musculoskeletal: Normal range of motion. She exhibits no edema and no tenderness.  Lymphadenopathy:    She has no cervical adenopathy.  Neurological: She is alert. She has normal reflexes. No cranial nerve deficit. Coordination normal.  Skin: Skin is warm and dry. No rash noted. No erythema. No pallor.  Psychiatric: She has a normal mood and affect.          Assessment & Plan:

## 2011-08-27 NOTE — Patient Instructions (Signed)
Keep watching bp at work- check when relaxed  If over 140/90 three times -- please follow up with me  Work on diet and exercise  Labs look ok  Please keep thinking about smoking cessation

## 2011-08-27 NOTE — Assessment & Plan Note (Signed)
In smoker  Intol of boniva  Rev dexa 10/11  Re check next year  Needs to quit smoking Need to look at other tx options

## 2011-08-27 NOTE — Assessment & Plan Note (Signed)
Reviewed health habits including diet and exercise and skin cancer prevention Also reviewed health mt list, fam hx and immunizations  Most imp to quit smoking  Rev labs from [facility] with fair cholesterol Rev low sat fat diet Rev wellness lab with pt in detail

## 2011-08-27 NOTE — Assessment & Plan Note (Signed)
Disc in detail risks of smoking and possible outcomes including copd, vascular/ heart disease, cancer , respiratory and sinus infections  Pt voices understanding  Pt is not ready to quit- honest about that

## 2011-08-27 NOTE — Assessment & Plan Note (Signed)
Pt seeing Dr Otilio Carpen nl bx and has mam planned for nov Nl breast exam today Continues self exams

## 2011-09-30 ENCOUNTER — Ambulatory Visit: Payer: Self-pay | Admitting: General Surgery

## 2011-09-30 HISTORY — PX: BREAST BIOPSY: SHX20

## 2011-10-01 ENCOUNTER — Ambulatory Visit: Payer: Self-pay | Admitting: General Surgery

## 2011-11-17 ENCOUNTER — Ambulatory Visit: Payer: Self-pay | Admitting: General Surgery

## 2012-08-02 ENCOUNTER — Telehealth: Payer: Self-pay | Admitting: Family Medicine

## 2012-08-02 NOTE — Telephone Encounter (Signed)
Caller: Yazaira/Patient; Phone: [phone]; Reason for Call: Needs order for bloodwork faxed to her at [phone]

## 2012-08-02 NOTE — Telephone Encounter (Signed)
Ask what pharmacy she is using - if [facility] I need to put the ID number on the px, thanks Send this back to me Out of office thursday

## 2012-08-08 NOTE — Telephone Encounter (Signed)
Spoke to patient she stated that her job do a wellness program where they do the bloodwork on site and send the results to the provider. She stated that it has been a week  And she need it as soon as possible to be faxed to [phone].

## 2012-08-08 NOTE — Telephone Encounter (Signed)
Rx faxed to [phone]

## 2012-08-08 NOTE — Telephone Encounter (Signed)
Spoke to patient spouse he stated that he would have her to call the office.

## 2012-08-08 NOTE — Telephone Encounter (Signed)
Order for wellness labs is done and in IN box

## 2012-08-28 ENCOUNTER — Encounter: Payer: Self-pay | Admitting: Family Medicine

## 2012-08-28 ENCOUNTER — Other Ambulatory Visit (HOSPITAL_COMMUNITY)
Admission: RE | Admit: 2012-08-28 | Discharge: 2012-08-28 | Disposition: A | Discharge status: Home / Self Care | Payer: BC Managed Care – PPO | Source: Ambulatory Visit | Attending: Family Medicine | Admitting: Family Medicine

## 2012-08-28 ENCOUNTER — Ambulatory Visit (INDEPENDENT_AMBULATORY_CARE_PROVIDER_SITE_OTHER): Payer: BC Managed Care – PPO | Admitting: Family Medicine

## 2012-08-28 VITALS — BP 128/78 | HR 76 | Temp 98.5°F | Ht 62.5 in | Wt 185.2 lb

## 2012-08-28 DIAGNOSIS — Z01419 Encounter for gynecological examination (general) (routine) without abnormal findings: Secondary | ICD-10-CM | POA: Insufficient documentation

## 2012-08-28 DIAGNOSIS — M949 Disorder of cartilage, unspecified: Secondary | ICD-10-CM

## 2012-08-28 DIAGNOSIS — Z780 Asymptomatic menopausal state: Secondary | ICD-10-CM | POA: Insufficient documentation

## 2012-08-28 DIAGNOSIS — Z1231 Encounter for screening mammogram for malignant neoplasm of breast: Secondary | ICD-10-CM | POA: Insufficient documentation

## 2012-08-28 DIAGNOSIS — F17200 Nicotine dependence, unspecified, uncomplicated: Secondary | ICD-10-CM

## 2012-08-28 DIAGNOSIS — M899 Disorder of bone, unspecified: Secondary | ICD-10-CM

## 2012-08-28 DIAGNOSIS — Z23 Encounter for immunization: Secondary | ICD-10-CM

## 2012-08-28 DIAGNOSIS — Z1151 Encounter for screening for human papillomavirus (HPV): Secondary | ICD-10-CM | POA: Insufficient documentation

## 2012-08-28 DIAGNOSIS — Z0000 Encounter for general adult medical examination without abnormal findings: Secondary | ICD-10-CM

## 2012-08-28 NOTE — Patient Instructions (Addendum)
Flu shot today Work on [employer] We will refer you for mammogram , dexa at check out  Continue your calcium and vitamin D  Think about quitting smoking

## 2012-08-28 NOTE — Assessment & Plan Note (Signed)
Due for 2 year dexa Smoker on ca and D Prev boniva-stopped due to fear of drug Would re consider if dexa looked worse  Scheduled that  Disc exercise and smoking cessation

## 2012-08-28 NOTE — Progress Notes (Signed)
Subjective:    Patient ID: Valerie Curry, female    DOB: 07/02/1946, 66 y.o.   MRN: [id number]  HPI Here for health maintenance exam and to review chronic medical problems   Is feeling good   Had some bursitis in hips- cortisone shots helped a lot   Wt is down 4 lb with bmi of 33 Has been eating too much - that does not help her joints at all  Is ready to start doing something about that  She snacks a lot -will stop that  She does exercise regularly - no zumba currently due to hips-   mammo per pt 1 year ago [facility] Self exam-no lumps or changes   Gyn exam- last one was here in 2010 Due for 3 year pap   pnemonia vaccine 6 years ago- wants to wait one more year for that  Flu shot - is due for   colonosc 8/05 -10 year recall   Labs from [facility]  dhol HDL 66 and LDL 107 with good trig   Nl thyroid, chemistries, cbc Vit D 37- did increase that - to over 4000 iu   Osteopenia Was on boniva for a while- decided to stop it Not on anything   Wants to schedule 2 year dexa at [facility]  If worse would want to try med again   Smoking -no desire to quit , understands that    Patient Active Problem List  Diagnosis  . TOBACCO ABUSE  . SKIN TAG  . OSTEOPENIA  . ABNORMAL MAMMOGRAM  . Routine general medical examination at a health care facility   Past Medical History  Diagnosis Date  . Osteopenia     took Boniva 1 1/2 years in past-doesn't want to restart it  . Positive PPD    Past Surgical History  Procedure Date  . Appendectomy   . Tonsillectomy   . Colonoscopy 8/05  . Bunionectomy    History  Substance Use Topics  . Smoking status: Current Every Day Smoker -- 1.0 packs/day    Types: Cigarettes  . Smokeless tobacco: Not on file  . Alcohol Use: Yes     rare   Family History  Problem Relation Age of Onset  . Lung cancer Father   . Heart disease Mother   . Hypertension Mother   . Breast cancer      Aunts  . Other Brother     blood clots  . Other     hypercoag-family history   No Known Allergies Current Outpatient Prescriptions on File Prior to Visit  Medication Sig Dispense Refill  . Ascorbic Acid (VITAMIN C) 1000 MG tablet Take 1,000 mg by mouth daily.        Marland Kitchen aspirin 81 MG tablet Take 81 mg by mouth daily.        . Calcium Carbonate-Vit D-Min 600-400 MG-UNIT TABS Take 1 tablet by mouth 2 (two) times daily.        . Cholecalciferol (VITAMIN D) 2000 UNITS tablet Take 2,000 Units by mouth daily.        . diazepam (VALIUM) 5 MG tablet Take 2.5 mg by mouth daily as needed.           Review of Systems Review of Systems  Constitutional: Negative for fever, appetite change, fatigue and unexpected weight change.  Eyes: Negative for pain and visual disturbance.  Respiratory: Negative for cough and shortness of breath.   Cardiovascular: Negative for cp or palpitations    Gastrointestinal: Negative for  nausea, diarrhea and constipation.  Genitourinary: Negative for urgency and frequency.  Skin: Negative for pallor or rash   MSK pos for hip pain from bursitis that is improving  Neurological: Negative for weakness, light-headedness, numbness and headaches.  Hematological: Negative for adenopathy. Does not bruise/bleed easily.  Psychiatric/Behavioral: Negative for dysphoric mood. The patient is not nervous/anxious.         Objective:   Physical Exam  Constitutional: She appears well-developed and well-nourished. No distress.       obese and well appearing   HENT:  Head: Normocephalic and atraumatic.  Right Ear: External ear normal.  Left Ear: External ear normal.  Nose: Nose normal.  Eyes: Conjunctivae normal and EOM are normal. Pupils are equal, round, and reactive to light. Right eye exhibits no discharge. Left eye exhibits no discharge. No scleral icterus.  Neck: Normal range of motion. Neck supple. No JVD present. Carotid bruit is not present. No thyromegaly present.  Cardiovascular: Normal rate, regular rhythm, normal heart  sounds and intact distal pulses.  Exam reveals no gallop.   Pulmonary/Chest: Effort normal and breath sounds normal. No respiratory distress. She has no wheezes.       Diffusely distant bs   Abdominal: Soft. Bowel sounds are normal. She exhibits no distension, no abdominal bruit and no mass. There is no tenderness.  Genitourinary: Rectum normal and uterus normal. No breast swelling, tenderness, discharge or bleeding. There is no tenderness on the right labia. There is no rash or tenderness on the left labia. Uterus is not enlarged and not tender. Cervix exhibits no motion tenderness, no discharge and no friability. Right adnexum displays no mass, no tenderness and no fullness. Left adnexum displays no mass, no tenderness and no fullness. No bleeding around the vagina. No vaginal discharge found.       Breast exam: No mass, nodules, thickening, tenderness, bulging, retraction, inflamation, nipple discharge or skin changes noted.  No axillary or clavicular LA.  Chaperoned exam.     Musculoskeletal: She exhibits no edema and no tenderness.  Lymphadenopathy:    She has no cervical adenopathy.  Neurological: She is alert. She has normal reflexes. No cranial nerve deficit. She exhibits normal muscle tone. Coordination normal.  Skin: Skin is warm and dry. No rash noted. No erythema. No pallor.  Psychiatric: She has a normal mood and affect.          Assessment & Plan:

## 2012-08-28 NOTE — Assessment & Plan Note (Signed)
Pap and exam done unremarkable

## 2012-08-28 NOTE — Assessment & Plan Note (Signed)
Disc in detail risks of smoking and possible outcomes including copd, vascular/ heart disease, cancer , respiratory and sinus infections  Pt voices understanding  Pt not interested in quitting at this time  

## 2012-08-28 NOTE — Assessment & Plan Note (Signed)
Scheduled dexa  

## 2012-08-28 NOTE — Assessment & Plan Note (Signed)
Reviewed health habits including diet and exercise and skin cancer prevention Also reviewed health mt list, fam hx and immunizations  Wellness labs rev 

## 2012-08-28 NOTE — Assessment & Plan Note (Signed)
Scheduled annual screening mammogram Nl breast exam today  Encouraged monthly self exams   

## 2012-09-07 ENCOUNTER — Ambulatory Visit: Payer: Self-pay | Admitting: Family Medicine

## 2012-09-10 ENCOUNTER — Encounter: Payer: Self-pay | Admitting: Family Medicine

## 2012-09-12 ENCOUNTER — Encounter: Payer: Self-pay | Admitting: Family Medicine

## 2012-09-13 LAB — HM PAP SMEAR: HM Pap smear: NEGATIVE

## 2012-09-20 ENCOUNTER — Encounter: Payer: Self-pay | Admitting: Family Medicine

## 2012-09-21 ENCOUNTER — Encounter: Payer: Self-pay | Admitting: *Deleted

## 2012-10-03 ENCOUNTER — Ambulatory Visit: Payer: Self-pay | Admitting: General Surgery

## 2012-11-20 ENCOUNTER — Encounter: Payer: Self-pay | Admitting: Family Medicine

## 2012-11-20 ENCOUNTER — Ambulatory Visit (INDEPENDENT_AMBULATORY_CARE_PROVIDER_SITE_OTHER): Payer: BC Managed Care – PPO | Admitting: Family Medicine

## 2012-11-20 VITALS — BP 138/90 | HR 68 | Temp 98.4°F | Ht 62.5 in | Wt 186.2 lb

## 2012-11-20 DIAGNOSIS — R319 Hematuria, unspecified: Secondary | ICD-10-CM

## 2012-11-20 DIAGNOSIS — S39012A Strain of muscle, fascia and tendon of lower back, initial encounter: Secondary | ICD-10-CM

## 2012-11-20 DIAGNOSIS — M545 Low back pain, unspecified: Secondary | ICD-10-CM

## 2012-11-20 DIAGNOSIS — R8290 Unspecified abnormal findings in urine: Secondary | ICD-10-CM

## 2012-11-20 DIAGNOSIS — S335XXA Sprain of ligaments of lumbar spine, initial encounter: Secondary | ICD-10-CM

## 2012-11-20 DIAGNOSIS — R82998 Other abnormal findings in urine: Secondary | ICD-10-CM

## 2012-11-20 LAB — POCT URINALYSIS DIPSTICK
Bilirubin, UA: NEGATIVE
Glucose, UA: NEGATIVE
Ketones, UA: NEGATIVE
Nitrite, UA: POSITIVE
Protein, UA: NEGATIVE
Spec Grav, UA: 1.015
Urobilinogen, UA: 0.2
pH, UA: 6

## 2012-11-20 MED ORDER — MELOXICAM 15 MG PO TABS: 15.0000 mg | ORAL_TABLET | Freq: Every day | ORAL | Status: DC

## 2012-11-20 MED ORDER — CYCLOBENZAPRINE HCL 10 MG PO TABS: 10.0000 mg | ORAL_TABLET | Freq: Every evening | ORAL | Status: DC | PRN

## 2012-11-20 NOTE — Assessment & Plan Note (Signed)
After moving heavy furniture  No neurol s/s See AVS for recommendation Heat/stretches/walking  mobic prn 1-2 weeks with food  Flexeril prn at night Update if not starting to improve in a week or if worsening

## 2012-11-20 NOTE — Patient Instructions (Addendum)
For back pain - walk as much as you can  Use heat 10 minutes at a time whenever you can  Take meloxicam as needed with food (1-2 weeks maximum)- for pain and inflammation Use flexeril (muscle relaxer) at night Update me if worse or not improving in several weeks

## 2012-11-20 NOTE — Progress Notes (Signed)
Subjective:    Patient ID: Valerie Curry, female    DOB: 10-Feb-1946, 66 y.o.   MRN: [id number]  HPI Here with back pain  This is new  Thinks she pulled a muscle yesterday pulling leaves out of a table , also moving chairs   Lower back - in the middle  Bending forward hurts the worst  Getting up and down hurts , but will loosen up after standing  No ref to legs  No numbness or weakness   No loss of bowel or bladder control   Patient Active Problem List  Diagnosis  . TOBACCO ABUSE  . SKIN TAG  . OSTEOPENIA  . ABNORMAL MAMMOGRAM  . Routine general medical examination at a health care facility  . Routine gynecological examination  . Other screening mammogram  . Post-menopausal   Past Medical History  Diagnosis Date  . Osteopenia     took Boniva 1 1/2 years in past-doesn't want to restart it  . Positive PPD    Past Surgical History  Procedure Date  . Appendectomy   . Tonsillectomy   . Colonoscopy 8/05  . Bunionectomy    History  Substance Use Topics  . Smoking status: Current Every Day Smoker -- 1.0 packs/day    Types: Cigarettes  . Smokeless tobacco: Not on file  . Alcohol Use: Yes     Comment: rare   Family History  Problem Relation Age of Onset  . Lung cancer Father   . Heart disease Mother   . Hypertension Mother   . Breast cancer      Aunts  . Other Brother     blood clots  . Other      hypercoag-family history   No Known Allergies Current Outpatient Prescriptions on File Prior to Visit  Medication Sig Dispense Refill  . Ascorbic Acid (VITAMIN C) 1000 MG tablet Take 1,000 mg by mouth daily.        Marland Kitchen aspirin 81 MG tablet Take 81 mg by mouth daily.        . Calcium Carbonate-Vit D-Min 600-400 MG-UNIT TABS Take 1 tablet by mouth 2 (two) times daily.        . Cholecalciferol (VITAMIN D) 2000 UNITS tablet Take 2,000 Units by mouth daily.        . diazepam (VALIUM) 5 MG tablet Take 2.5 mg by mouth daily as needed.           Review of  Systems Review of Systems  Constitutional: Negative for fever, appetite change, fatigue and unexpected weight change.  Eyes: Negative for pain and visual disturbance.  Respiratory: Negative for cough and shortness of breath.   Cardiovascular: Negative for cp or palpitations    Gastrointestinal: Negative for nausea, diarrhea and constipation.  Genitourinary: Negative for urgency and frequency. neg for flank pain  Skin: Negative for pallor or rash  MSK pos for back pain, neg for joint swelling or tenderness  Neurological: Negative for weakness, light-headedness, numbness and headaches.  Hematological: Negative for adenopathy. Does not bruise/bleed easily.  Psychiatric/Behavioral: Negative for dysphoric mood. The patient is not nervous/anxious.         Objective:   Physical Exam  Constitutional: She appears well-developed and well-nourished. No distress.  HENT:  Head: Normocephalic and atraumatic.  Eyes: Conjunctivae normal and EOM are normal. Pupils are equal, round, and reactive to light.  Neck: Normal range of motion. Neck supple.  Cardiovascular: Normal rate and regular rhythm.   Pulmonary/Chest: Effort normal and breath sounds  normal. No respiratory distress. She has no wheezes.  Abdominal: Soft. Bowel sounds are normal. She exhibits no distension and no mass. There is no tenderness.       No suprapubic tenderness or fullness    Musculoskeletal: Normal range of motion. She exhibits tenderness. She exhibits no edema.       Lumbar back: She exhibits tenderness, pain and spasm. She exhibits normal range of motion, no bony tenderness, no swelling and no edema.       No cva tenderness   No spinous process tenderness Tender R paraspinal musculature  Nl rom LS and hips  Pain to flex past 90 deg  Nl gait Neg SLR  Lymphadenopathy:    She has no cervical adenopathy.  Neurological: She is alert. She has normal strength and normal reflexes. She displays no atrophy and no tremor. No  sensory deficit.  Skin: Skin is warm and dry. No rash noted. No erythema. No pallor.  Psychiatric: She has a normal mood and affect.          Assessment & Plan:

## 2012-11-22 DIAGNOSIS — R8290 Unspecified abnormal findings in urine: Secondary | ICD-10-CM | POA: Insufficient documentation

## 2012-11-22 LAB — URINE CULTURE: Colony Count: >=100000

## 2012-11-22 NOTE — Assessment & Plan Note (Signed)
This does not seem to correlate with the type of back pain she is having Sent for cx

## 2013-05-28 ENCOUNTER — Encounter: Payer: Self-pay | Admitting: *Deleted

## 2013-05-28 DIAGNOSIS — C50919 Malignant neoplasm of unspecified site of unspecified female breast: Secondary | ICD-10-CM | POA: Insufficient documentation

## 2013-07-04 ENCOUNTER — Other Ambulatory Visit: Payer: Self-pay

## 2013-08-16 ENCOUNTER — Encounter: Payer: Self-pay | Admitting: Adult Health

## 2013-08-16 ENCOUNTER — Ambulatory Visit (INDEPENDENT_AMBULATORY_CARE_PROVIDER_SITE_OTHER): Payer: BC Managed Care – PPO | Admitting: Adult Health

## 2013-08-16 VITALS — BP 138/80 | HR 82 | Temp 98.0°F | Resp 12 | Ht 62.5 in | Wt 191.5 lb

## 2013-08-16 DIAGNOSIS — F17200 Nicotine dependence, unspecified, uncomplicated: Secondary | ICD-10-CM

## 2013-08-16 DIAGNOSIS — Z23 Encounter for immunization: Secondary | ICD-10-CM | POA: Insufficient documentation

## 2013-08-16 DIAGNOSIS — M899 Disorder of bone, unspecified: Secondary | ICD-10-CM

## 2013-08-16 NOTE — Assessment & Plan Note (Signed)
Influenza vaccine given

## 2013-08-16 NOTE — Progress Notes (Deleted)
  Subjective:    Patient ID: Valerie Curry, female    DOB: 04/21/1946, 67 y.o.   MRN: [id number]  HPI    Review of Systems     Objective:   Physical Exam        Assessment & Plan:

## 2013-08-16 NOTE — Assessment & Plan Note (Signed)
Last bone scan 08/2012. Stable. Next bone scan 08/2014.

## 2013-08-16 NOTE — Assessment & Plan Note (Signed)
Smoking 1/2 ppd. Not ready to quit at this time. Continue to encourage smoking cessation.

## 2013-08-16 NOTE — Progress Notes (Signed)
Subjective:    Patient ID: Valerie Curry, female    DOB: [birth year], 67 y.o.   MRN: [id number]  HPI  Pt is pleasant 67 yo female presenting to clinic to establish care. Pt is coming from Dr. Milinda Antis, states [place] office is much more convenient for her.  Pt denies any concerns at this time.  Pt states her last physical was last October.     Past Medical History  Diagnosis Date  . Osteopenia     took Boniva 1 1/2 years in past-doesn't want to restart it  . Positive PPD     as a child. xray was normal.  . Personal history of tobacco use, presenting hazards to health   . Systemic sclerosis   . Lump or mass in breast 2011/2012    benign  . Obesity, unspecified   . Family history of malignant neoplasm of breast     Maternal/paternal aunts and cousin  . Hypertension      Past Surgical History  Procedure Laterality Date  . Tonsillectomy  1968  . Colonoscopy  8/05    Dr. Mechele Collin  . Bunionectomy  2010  . Appendectomy  1961  . Tubal ligation  1974  . Breast biopsy Right Nov 2011    benign  . Breast biopsy Left Nov 2012    dilated ducts-benign     Family History  Problem Relation Age of Onset  . Lung cancer Father   . Heart disease Father 33    Cardiac Arrest  . Heart disease Mother   . Hypertension Mother   . Stroke Mother   . Breast cancer      Aunts  . Other      hypercoag-family history  . Other Brother     blood clots  . Cancer Maternal Aunt     Breast cancer     History   Social History  . Marital Status: Married    Spouse Name: N/A    Number of Children: 2  . Years of Education: 15   Occupational History  . Administrative Assistant [employer]   Social History Main Topics  . Smoking status: Current Every Day Smoker -- 0.50 packs/day for 48 years    Types: Cigarettes  . Smokeless tobacco: Never Used  . Alcohol Use: Yes     Comment: rare  . Drug Use: No  . Sexual Activity: Yes    Birth Control/ Protection: Post-menopausal   Other  Topics Concern  . Not on file   Social History Narrative   Pt is 67yo female. Pt was born in [place], moved to [place] when she was 74.  Pt is married to husband of 22 years.  Pt has 2 sons from a previous marriage and 4 step sons and 76 Grandchildren.  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  Pt works out at gym 3/week (2 days a week in a class, 1 day a week with an Comptroller).  Pt enjoys reading, spending time with her family. Pt and her husband are members of [employer] and active in their church.      Review of Systems  Constitutional: Negative.   HENT: Negative.   Eyes: Negative.   Respiratory: Negative.   Cardiovascular: Negative.   Gastrointestinal: Negative.   Endocrine: Negative.   Genitourinary: Negative.   Musculoskeletal: Positive for arthralgias.       Pt reports joint pain after working out.  Denies any acute pain.  Skin:  Negative.   Allergic/Immunologic: Negative.   Neurological: Negative.   Hematological: Negative.   Psychiatric/Behavioral: Negative.     BP 138/80  Pulse 82  Temp(Src) 98 F (36.7 C) (Oral)  Resp 12  Ht 5' 2.5" (1.588 m)  Wt 191 lb 8 oz (86.864 kg)  BMI 34.45 kg/m2  SpO2 99%    Objective:   Physical Exam  Constitutional: She is oriented to person, place, and time. She appears well-developed and well-nourished.  HENT:  Head: Normocephalic and atraumatic.  Right Ear: External ear normal.  Left Ear: External ear normal.  Nose: Nose normal.  Mouth/Throat: Oropharynx is clear and moist.  Eyes: Conjunctivae are normal. Pupils are equal, round, and reactive to light.  Neck: Normal range of motion. Neck supple.  Cardiovascular: Normal rate, regular rhythm, normal heart sounds and intact distal pulses.   No murmur heard. Pulmonary/Chest: Effort normal and breath sounds normal.  Abdominal: Soft. Bowel sounds are normal. She exhibits no distension. There is no tenderness.  Musculoskeletal: Normal  range of motion.  Neurological: She is alert and oriented to person, place, and time. She has normal reflexes.  Skin: Skin is warm and dry.  Psychiatric: She has a normal mood and affect. Her behavior is normal. Judgment and thought content normal.    BP 138/80  Pulse 82  Temp(Src) 98 F (36.7 C) (Oral)  Resp 12  Ht 5' 2.5" (1.588 m)  Wt 191 lb 8 oz (86.864 kg)  BMI 34.45 kg/m2  SpO2 99%     Assessment & Plan:

## 2013-08-16 NOTE — Patient Instructions (Addendum)
   Thank you for choosing [place] [facility] for your health care needs.  Please have your labs drawn prior to your next appointment.  The results will be available through MyChart for your convenience. Please remember to activate this. The activation code is located at the end of this form.  You received your flu vaccine today :)

## 2013-08-31 ENCOUNTER — Encounter: Payer: Self-pay | Admitting: Adult Health

## 2013-08-31 ENCOUNTER — Ambulatory Visit (INDEPENDENT_AMBULATORY_CARE_PROVIDER_SITE_OTHER): Payer: BC Managed Care – PPO | Admitting: Adult Health

## 2013-08-31 VITALS — BP 126/78 | HR 77 | Temp 98.3°F | Resp 12 | Ht 62.5 in | Wt 192.0 lb

## 2013-08-31 DIAGNOSIS — M899 Disorder of bone, unspecified: Secondary | ICD-10-CM

## 2013-08-31 DIAGNOSIS — I10 Essential (primary) hypertension: Secondary | ICD-10-CM

## 2013-08-31 DIAGNOSIS — N6019 Diffuse cystic mastopathy of unspecified breast: Secondary | ICD-10-CM

## 2013-08-31 DIAGNOSIS — Z1231 Encounter for screening mammogram for malignant neoplasm of breast: Secondary | ICD-10-CM

## 2013-08-31 DIAGNOSIS — Z0000 Encounter for general adult medical examination without abnormal findings: Secondary | ICD-10-CM

## 2013-08-31 DIAGNOSIS — F17200 Nicotine dependence, unspecified, uncomplicated: Secondary | ICD-10-CM

## 2013-08-31 DIAGNOSIS — E663 Overweight: Secondary | ICD-10-CM

## 2013-08-31 MED ORDER — HYDROCHLOROTHIAZIDE 25 MG PO TABS: 12.5000 mg | ORAL_TABLET | Freq: Every day | ORAL | Status: DC

## 2013-08-31 NOTE — Assessment & Plan Note (Signed)
Bone density due 2015

## 2013-08-31 NOTE — Assessment & Plan Note (Signed)
Not ready to quit at this time. Continue to encourage smoking cessation.

## 2013-08-31 NOTE — Progress Notes (Signed)
Subjective:    Patient ID: Valerie Curry, female    DOB: [birth year], 67 y.o.   MRN: [id number]  HPI  Pt is pleasant 67 yo female, presents to clinic for annual physical exam.  Pt has been recording blood pressure readings at work for the past 2 weeks and brought readings to visit to discuss.  Pt had lab work performed at clinic at place of employment last week and had results sent to clinic.      Past Medical History  Diagnosis Date  . Osteopenia     took Boniva 1 1/2 years in past-doesn't want to restart it  . Positive PPD     as a child. xray was normal.  . Personal history of tobacco use, presenting hazards to health   . Systemic sclerosis   . Lump or mass in breast 2011/2012    benign  . Obesity, unspecified   . Family history of malignant neoplasm of breast     Maternal/paternal aunts and cousin  . Hypertension      Past Surgical History  Procedure Laterality Date  . Tonsillectomy  1968  . Colonoscopy  8/05    Dr. Mechele Collin  . Bunionectomy  2010  . Appendectomy  1961  . Tubal ligation  1974  . Breast biopsy Right Nov 2011    benign  . Breast biopsy Left Nov 2012    dilated ducts-benign     Family History  Problem Relation Age of Onset  . Lung cancer Father   . Heart disease Father 27    Cardiac Arrest  . Heart disease Mother   . Hypertension Mother   . Stroke Mother   . Breast cancer      Aunts  . Other      hypercoag-family history  . Other Brother     blood clots  . Cancer Maternal Aunt     Breast cancer     History   Social History  . Marital Status: Married    Spouse Name: N/A    Number of Children: 2  . Years of Education: 15   Occupational History  . Administrative Assistant [employer]   Social History Main Topics  . Smoking status: Current Every Day Smoker -- 0.50 packs/day for 48 years    Types: Cigarettes  . Smokeless tobacco: Never Used  . Alcohol Use: Yes     Comment: rare  . Drug Use: No  . Sexual Activity: Yes   Birth Control/ Protection: Post-menopausal   Other Topics Concern  . Not on file   Social History Narrative   Pt is 67yo female. Pt was born in [place], moved to [place] when she was 73.  Pt is married to husband of 22 years.  Pt has 2 sons from a previous marriage and 4 step sons and 28 Grandchildren.  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  Pt works out at gym 3/week (2 days a week in a class, 1 day a week with an Comptroller).  Pt enjoys reading, spending time with her family. Pt and her husband are members of [employer] and active in their church.       Current Outpatient Prescriptions on File Prior to Visit  Medication Sig Dispense Refill  . Ascorbic Acid (VITAMIN C) 1000 MG tablet Take 1,000 mg by mouth daily.        Marland Kitchen aspirin 81 MG tablet Take 81 mg by mouth daily.        Marland Kitchen  Calcium Carbonate-Vit D-Min 600-400 MG-UNIT TABS Take 1 tablet by mouth 2 (two) times daily.        . cholecalciferol (VITAMIN D) 400 UNITS TABS tablet Take 400 Units by mouth daily.       No current facility-administered medications on file prior to visit.     Review of Systems  Constitutional: Negative.  Negative for fever, activity change, fatigue and unexpected weight change.  HENT: Negative.   Eyes: Negative.   Respiratory: Negative.  Negative for cough, shortness of breath and wheezing.   Cardiovascular: Negative.  Negative for chest pain.       Pt reports swelling in hands x 2 weeks  Gastrointestinal: Negative.  Negative for nausea, vomiting, abdominal pain, diarrhea and constipation.  Endocrine: Negative.  Negative for cold intolerance, heat intolerance, polydipsia, polyphagia and polyuria.  Genitourinary: Negative.  Negative for difficulty urinating.  Musculoskeletal: Negative.   Skin: Negative.   Allergic/Immunologic: Negative.   Neurological: Negative.  Negative for dizziness, light-headedness and headaches.  Hematological: Negative.     Psychiatric/Behavioral: Negative.        Objective:   Physical Exam  Constitutional: She is oriented to person, place, and time. She appears well-developed and well-nourished.  HENT:  Head: Normocephalic and atraumatic.  Right Ear: Hearing and external ear normal.  Left Ear: Hearing and external ear normal.  Nose: Nose normal.  Mouth/Throat: Uvula is midline, oropharynx is clear and moist and mucous membranes are normal.  Cerumen buildup-right ear canal  Eyes: Conjunctivae, EOM and lids are normal. Pupils are equal, round, and reactive to light.  Neck: Normal range of motion. Neck supple.  Cardiovascular: Normal rate, regular rhythm, normal heart sounds, intact distal pulses and normal pulses.   Pulmonary/Chest: Effort normal and breath sounds normal.  Abdominal: Soft. Normal appearance and bowel sounds are normal. There is no tenderness.  Genitourinary: No breast swelling, tenderness, discharge or bleeding.  Breast exam: No mass, nodules, tenderness, retraction, inflamation, nipple discharge or peau d'orange.  No lymphadenopathy. NP student present during exam.  Musculoskeletal: Normal range of motion.  Neurological: She is alert and oriented to person, place, and time. She has normal strength and normal reflexes. No cranial nerve deficit or sensory deficit. She displays a negative Romberg sign. Coordination and gait normal.  Skin: Skin is warm, dry and intact.     Psychiatric: She has a normal mood and affect. Her speech is normal and behavior is normal. Judgment and thought content normal. Cognition and memory are normal.    BP 126/78  Pulse 77  Temp(Src) 98.3 F (36.8 C) (Oral)  Resp 12  Ht 5' 2.5" (1.588 m)  Wt 192 lb (87.091 kg)  BMI 34.54 kg/m2  SpO2 96%       Assessment & Plan:

## 2013-08-31 NOTE — Assessment & Plan Note (Signed)
Ordered Diagnostic mammogram bil. Hx of abnormal mammogram

## 2013-08-31 NOTE — Patient Instructions (Addendum)
  I am starting you on HCTZ 12.5 mg daily for your blood pressure. Take as early in the day as possible as it will make you urinate.  Monitor your blood pressure on this medication so that we can make adjustments on your next visit as needed.  Return for follow up in 1 month.  Try Dr. Melina Schools diet - it is easy to follow and, if followed correctly, you can lose up to 7 lbs in 1 month.  Please see Amber to schedule your Diagnostic mammogram. Have [place] send me your results.

## 2013-08-31 NOTE — Assessment & Plan Note (Signed)
08/20/13 - 152/86 08/21/13 - 148/88 08/22/2013 -140/90 08/24/2013 - 140/86 08/28/2013 - 140/86  Start HCTZ 12.5 mg daily. RTC for follow up in 1 month

## 2013-08-31 NOTE — Assessment & Plan Note (Signed)
Patient has gained a considerable amount of weight. She attributes this to stressful eating. Her 66 year old granddaughter died from complications of strep throat during the summer. She is planning on starting a diet and exercise program and getting some of this weight off. Provided her with a sample of Dr. Melina Schools diet plan. Followup in one month.

## 2013-08-31 NOTE — Assessment & Plan Note (Signed)
PAP 2013. Repeat 2016. Diagnostic mammogram ordered 2/2 last two years with finding resulting in FNA. Labs reviewed with patient. Lipids, cmet, tsh, vit b12, vit D (normal). Hgb A1c 5.8 pre diabetes. Weight discussed and provided with sample of Dr. Melina Schools diet. She is exercising. Colonoscopy due 2015. Bone density due 2015.

## 2013-09-06 ENCOUNTER — Ambulatory Visit (INDEPENDENT_AMBULATORY_CARE_PROVIDER_SITE_OTHER): Payer: BC Managed Care – PPO | Admitting: *Deleted

## 2013-09-06 DIAGNOSIS — H6120 Impacted cerumen, unspecified ear: Secondary | ICD-10-CM

## 2013-09-06 DIAGNOSIS — H6121 Impacted cerumen, right ear: Secondary | ICD-10-CM

## 2013-09-06 NOTE — Progress Notes (Signed)
Pt presented to office for right ear lavage for cerumen impaction. Ear drum not visualized on initial exam. Lavaged ear with water without difficulty, pt tolerated well without complications. Post lavage, ear drum was visualized, no redness noted.

## 2013-10-04 ENCOUNTER — Ambulatory Visit: Payer: Self-pay | Admitting: Adult Health

## 2013-10-04 LAB — HM MAMMOGRAPHY: HM Mammogram: NORMAL

## 2013-10-05 ENCOUNTER — Encounter: Payer: Self-pay | Admitting: Adult Health

## 2013-10-05 ENCOUNTER — Ambulatory Visit (INDEPENDENT_AMBULATORY_CARE_PROVIDER_SITE_OTHER): Payer: BC Managed Care – PPO | Admitting: Adult Health

## 2013-10-05 VITALS — BP 118/76 | HR 83 | Resp 12 | Wt 187.0 lb

## 2013-10-05 DIAGNOSIS — E663 Overweight: Secondary | ICD-10-CM

## 2013-10-05 DIAGNOSIS — I10 Essential (primary) hypertension: Secondary | ICD-10-CM

## 2013-10-05 MED ORDER — HYDROCHLOROTHIAZIDE 25 MG PO TABS: 12.5000 mg | ORAL_TABLET | Freq: Every day | ORAL | Status: DC

## 2013-10-05 NOTE — Assessment & Plan Note (Signed)
Well controlled on HCTZ 12.5 mg. Refill sent to pharmacy. Follow up in 6 months.

## 2013-10-05 NOTE — Progress Notes (Signed)
Pre-visit discussion using our clinic review tool. No additional management support is needed unless otherwise documented below in the visit note.  

## 2013-10-05 NOTE — Assessment & Plan Note (Signed)
Doing well on low glycemic index diet. Lost 5 lbs. Continues to exercise. Follow up in 6 months.

## 2013-10-05 NOTE — Progress Notes (Signed)
  Subjective:    Patient ID: Valerie Curry, female    DOB: 09-03-1946, 67 y.o.   MRN: [id number]  HPI  Pt returns to clinic for 1 month follow up for weight and blood pressure. Pt has been tolerating HCTZ well, denies any lightheadedness or dizziness.  Pt also states she has been following low glycemic index diet she was given at last visit and has lost 5 lbs.  Pt states this plan is working very well for her and the inclusion of snacks keep her from being hungry and overeating at meal times.  Pt states she continues to work out at gym at work. Pt denies any additional concerns at this time. She will need a refill on the HCTZ.   Current Outpatient Prescriptions on File Prior to Visit  Medication Sig Dispense Refill  . Ascorbic Acid (VITAMIN C) 1000 MG tablet Take 1,000 mg by mouth daily.        Marland Kitchen aspirin 81 MG tablet Take 81 mg by mouth daily.        . Calcium Carbonate-Vit D-Min 600-400 MG-UNIT TABS Take 1 tablet by mouth 2 (two) times daily.        . cholecalciferol (VITAMIN D) 400 UNITS TABS tablet Take 400 Units by mouth daily.       No current facility-administered medications on file prior to visit.     Review of Systems  Constitutional: Negative.  Negative for fatigue.  Respiratory: Negative.   Cardiovascular: Negative.   Gastrointestinal: Negative.   Neurological: Negative for dizziness and light-headedness.       Objective:   Physical Exam  Constitutional: She is oriented to person, place, and time. She appears well-developed and well-nourished.  Cardiovascular: Normal rate, regular rhythm, normal heart sounds and intact distal pulses.  Exam reveals no gallop and no friction rub.   No murmur heard. Pulmonary/Chest: Effort normal and breath sounds normal. No respiratory distress. She has no wheezes. She has no rales.  Neurological: She is alert and oriented to person, place, and time.  Skin: Skin is warm, dry and intact.  Psychiatric: She has a normal mood and affect.  Her speech is normal and behavior is normal. Thought content normal. Cognition and memory are normal.    BP 118/76  Pulse 83  Resp 12  Wt 187 lb (84.823 kg)  SpO2 95%       Assessment & Plan:

## 2013-10-19 ENCOUNTER — Encounter: Payer: Self-pay | Admitting: Adult Health

## 2014-04-05 ENCOUNTER — Ambulatory Visit: Payer: BC Managed Care – PPO | Admitting: Adult Health

## 2014-04-10 ENCOUNTER — Encounter: Payer: Self-pay | Admitting: Adult Health

## 2014-04-10 ENCOUNTER — Telehealth: Payer: Self-pay | Admitting: Adult Health

## 2014-04-10 ENCOUNTER — Ambulatory Visit (INDEPENDENT_AMBULATORY_CARE_PROVIDER_SITE_OTHER): Payer: BC Managed Care – PPO | Admitting: Adult Health

## 2014-04-10 VITALS — BP 123/66 | HR 108 | Temp 97.9°F | Resp 14 | Wt 187.0 lb

## 2014-04-10 DIAGNOSIS — I10 Essential (primary) hypertension: Secondary | ICD-10-CM

## 2014-04-10 DIAGNOSIS — R635 Abnormal weight gain: Secondary | ICD-10-CM | POA: Insufficient documentation

## 2014-04-10 NOTE — Telephone Encounter (Signed)
Relevant patient education mailed to patient.  

## 2014-04-10 NOTE — Patient Instructions (Signed)
  Your blood pressure is doing well.  Good idea to join [employer]  I will see you for your physical in October

## 2014-04-10 NOTE — Progress Notes (Signed)
Pre visit review using our clinic review tool, if applicable. No additional management support is needed unless otherwise documented below in the visit note. 

## 2014-04-10 NOTE — Progress Notes (Signed)
Subjective:    Patient ID: Valerie Curry, female    DOB: 28-Mar-1946, 68 y.o.   MRN: [id number]  HPI Patient is a pleasant 68 year old female who presents to clinic for followup hypertension and weight loss. Pertaining to her hypertension, it is very well controlled on 12.5 mg of hydrochlorothiazide. She reports no side effects from medications. Pertaining to her weight loss, she continues to exercise on a regular basis. Has not been doing as well as she hoped regarding dietary intake. She reports that she is joining [employer] at her employer.  She brings recent labs that have been drawn at [facility]. Cholesterol looks good. Her LDL has actually gone down 1 point from 104-103. Her hemoglobin A1c has also decreased from 5.8-5.7. C. reactive protein is slightly elevated at 6.3 mg/L. Patient will be scheduled for her physical exam in October.   Past Medical History  Diagnosis Date  . Osteopenia     took Boniva 1 1/2 years in past-doesn't want to restart it  . Positive PPD     as a child. xray was normal.  . Personal history of tobacco use, presenting hazards to health   . Systemic sclerosis   . Lump or mass in breast 2011/2012    benign  . Obesity, unspecified   . Family history of malignant neoplasm of breast     Maternal/paternal aunts and cousin  . Hypertension    Current Outpatient Prescriptions on File Prior to Visit  Medication Sig Dispense Refill  . Ascorbic Acid (VITAMIN C) 1000 MG tablet Take 1,000 mg by mouth daily.        Marland Kitchen aspirin 81 MG tablet Take 81 mg by mouth daily.        . Calcium Carbonate-Vit D-Min 600-400 MG-UNIT TABS Take 1 tablet by mouth 2 (two) times daily.        . cholecalciferol (VITAMIN D) 400 UNITS TABS tablet Take 400 Units by mouth daily.      . hydrochlorothiazide (HYDRODIURIL) 25 MG tablet Take 0.5 tablets (12.5 mg total) by mouth daily.  30 tablet  6   No current facility-administered medications on file prior to visit.     Review of Systems  Constitutional: Negative.   HENT: Negative.   Eyes: Negative.   Respiratory: Negative.  Negative for chest tightness and shortness of breath.   Cardiovascular: Negative.  Negative for chest pain, palpitations and leg swelling.  Gastrointestinal: Negative.   Endocrine: Negative.   Genitourinary: Negative.   Musculoskeletal: Negative.   Skin: Negative.   Allergic/Immunologic: Negative.   Neurological: Negative.   Hematological: Negative.   Psychiatric/Behavioral: Negative.        Objective:   Physical Exam  Constitutional: She is oriented to person, place, and time. No distress.  HENT:  Head: Normocephalic and atraumatic.  Eyes: Conjunctivae and EOM are normal.  Neck: Normal range of motion. Neck supple.  Cardiovascular: Normal rate, regular rhythm, normal heart sounds and intact distal pulses.  Exam reveals no gallop and no friction rub.   No murmur heard. Pulmonary/Chest: Effort normal and breath sounds normal. No respiratory distress. She has no wheezes. She has no rales.  Musculoskeletal: Normal range of motion.  Neurological: She is alert and oriented to person, place, and time. She has normal reflexes. Coordination normal.  Skin: Skin is warm and dry.  Psychiatric: She has a normal mood and affect. Her behavior is normal. Judgment and thought content normal.      Assessment & Plan:  1. HTN (hypertension) Well controlled on HCTZ 12.5 mg. Recent labs with normal kidney, electrolyte. Continue to follow  2. Weight increase Joining [employer]. Exercises regularly. Continue to follow. Physical exam in October

## 2014-04-11 ENCOUNTER — Telehealth: Payer: Self-pay | Admitting: Adult Health

## 2014-04-11 NOTE — Telephone Encounter (Signed)
Relevant patient education assigned to patient using Emmi. ° °

## 2014-04-15 ENCOUNTER — Encounter: Payer: Self-pay | Admitting: General Surgery

## 2014-04-15 ENCOUNTER — Ambulatory Visit: Payer: Self-pay | Admitting: General Surgery

## 2014-04-15 ENCOUNTER — Ambulatory Visit (INDEPENDENT_AMBULATORY_CARE_PROVIDER_SITE_OTHER): Payer: BC Managed Care – PPO | Admitting: General Surgery

## 2014-04-15 ENCOUNTER — Other Ambulatory Visit: Payer: Self-pay

## 2014-04-15 VITALS — BP 134/80 | HR 72 | Resp 14 | Ht 63.0 in | Wt 187.0 lb

## 2014-04-15 DIAGNOSIS — Z1211 Encounter for screening for malignant neoplasm of colon: Secondary | ICD-10-CM

## 2014-04-15 MED ORDER — POLYETHYLENE GLYCOL 3350 17 GM/SCOOP PO POWD: 1.0000 | Freq: Once | ORAL | Status: DC

## 2014-04-15 NOTE — Progress Notes (Signed)
Patient has been scheduled for a colonoscopy at [facility] on 06/05/14. Patient is aware to pre register with the hospital at least 2 days prior. She will only take her HCTZ with a small sip of water the day of the exam. Miralax prescription has been sent into her pharmacy. Patient is aware of date, and all instructions.

## 2014-04-15 NOTE — Patient Instructions (Signed)

## 2014-04-15 NOTE — Progress Notes (Signed)
Patient ID: Valerie Curry, female   DOB: [birth year], 68 y.o.   MRN: [id number]  Chief Complaint  Patient presents with  . Other    colonoscopy    HPI Valerie Curry is a 68 y.o. female. here today to discuss colonoscopy. His last colonoscopy was done in 2005 by DR. Elliott.No GI problems at this time. Marland KitchenHPI  Past Medical History  Diagnosis Date  . Osteopenia     took Boniva 1 1/2 years in past-doesn't want to restart it  . Positive PPD     as a child. xray was normal.  . Personal history of tobacco use, presenting hazards to health   . Systemic sclerosis   . Lump or mass in breast 2011/2012    benign  . Obesity, unspecified   . Family history of malignant neoplasm of breast     Maternal/paternal aunts and cousin  . Hypertension     Past Surgical History  Procedure Laterality Date  . Tonsillectomy  1968  . Colonoscopy  8/05    Dr. Vira Agar  . Bunionectomy  2010  . Appendectomy  1961  . Tubal ligation  1974  . Breast biopsy Right Nov 2011    benign  . Breast biopsy Left Nov 2012    dilated ducts-benign    Family History  Problem Relation Age of Onset  . Lung cancer Father   . Heart disease Father 18    Cardiac Arrest  . Heart disease Mother   . Hypertension Mother   . Stroke Mother   . Breast cancer      Aunts  . Other      hypercoag-family history  . Other Brother     blood clots  . Cancer Maternal Aunt     Breast cancer    Social History History  Substance Use Topics  . Smoking status: Current Every Day Smoker -- 0.50 packs/day for 48 years    Types: Cigarettes  . Smokeless tobacco: Never Used  . Alcohol Use: Yes     Comment: rare    No Known Allergies  Current Outpatient Prescriptions  Medication Sig Dispense Refill  . Ascorbic Acid (VITAMIN C) 1000 MG tablet Take 1,000 mg by mouth daily.        Marland Kitchen aspirin 81 MG tablet Take 81 mg by mouth daily.        . Calcium Carbonate-Vit D-Min 600-400 MG-UNIT TABS Take 1 tablet by mouth 2 (two) times  daily.        . cholecalciferol (VITAMIN D) 400 UNITS TABS tablet Take 400 Units by mouth daily.      . hydrochlorothiazide (HYDRODIURIL) 25 MG tablet Take 0.5 tablets (12.5 mg total) by mouth daily.  30 tablet  6   No current facility-administered medications for this visit.    Review of Systems Review of Systems  Constitutional: Negative.   Respiratory: Negative.   Cardiovascular: Negative.     Blood pressure 134/80, pulse 72, resp. rate 14, height 5\' 3"  (1.6 m), weight 187 lb (84.823 kg).  Physical Exam Physical Exam  Constitutional: She is oriented to person, place, and time. She appears well-developed and well-nourished.  Eyes: Conjunctivae are normal. No scleral icterus.  Neck: Neck supple. No mass and no thyromegaly present.  Cardiovascular: Normal rate, regular rhythm and normal heart sounds.   Pulmonary/Chest: Effort normal and breath sounds normal.  Abdominal: Soft. Bowel sounds are normal. There is no hepatomegaly. There is no tenderness. No hernia.  Neurological: She is alert  and oriented to person, place, and time.  Skin: Skin is warm and dry.    Data Reviewed None   Assessment    Pt due for screening colonoscopy. Exam is stable    Plan    Discussed screening colonoscopy. Procedure, risks and benefits explained to her. She is agreeable.       Valerie Curry 04/15/2014, 3:05 PM

## 2014-05-29 ENCOUNTER — Other Ambulatory Visit: Payer: Self-pay | Admitting: General Surgery

## 2014-05-29 DIAGNOSIS — Z1211 Encounter for screening for malignant neoplasm of colon: Secondary | ICD-10-CM

## 2014-06-05 ENCOUNTER — Ambulatory Visit: Payer: Self-pay | Admitting: General Surgery

## 2014-06-05 DIAGNOSIS — Z1211 Encounter for screening for malignant neoplasm of colon: Secondary | ICD-10-CM

## 2014-06-05 LAB — HM COLONOSCOPY: HM Colonoscopy: NORMAL

## 2014-06-06 ENCOUNTER — Encounter: Payer: Self-pay | Admitting: General Surgery

## 2014-09-03 ENCOUNTER — Encounter: Payer: BC Managed Care – PPO | Admitting: Adult Health

## 2014-09-13 ENCOUNTER — Ambulatory Visit (INDEPENDENT_AMBULATORY_CARE_PROVIDER_SITE_OTHER): Payer: BC Managed Care – PPO | Admitting: Internal Medicine

## 2014-09-13 ENCOUNTER — Encounter: Payer: Self-pay | Admitting: Internal Medicine

## 2014-09-13 VITALS — BP 118/76 | HR 77 | Temp 98.2°F | Resp 14 | Ht 62.5 in | Wt 196.2 lb

## 2014-09-13 DIAGNOSIS — Z0000 Encounter for general adult medical examination without abnormal findings: Secondary | ICD-10-CM | POA: Insufficient documentation

## 2014-09-13 DIAGNOSIS — Z1239 Encounter for other screening for malignant neoplasm of breast: Secondary | ICD-10-CM

## 2014-09-13 DIAGNOSIS — Z23 Encounter for immunization: Secondary | ICD-10-CM

## 2014-09-13 DIAGNOSIS — E669 Obesity, unspecified: Secondary | ICD-10-CM | POA: Insufficient documentation

## 2014-09-13 MED ORDER — HYDROCHLOROTHIAZIDE 25 MG PO TABS: 12.5000 mg | ORAL_TABLET | Freq: Every day | ORAL | Status: DC

## 2014-09-13 NOTE — Assessment & Plan Note (Signed)
General medical exam including breast exam normal today. PAP and pelvic deferred as normal 2013, plan repeat 2016. Mammogram ordered. Colonoscopy UTD and reviewed. Prevnar vaccine given today. Flu vaccine UTD. Encourage smoking cessation. Discussed screening for lung cancer with CT chest, and she prefers to hold off on this until next year. Labs ordered including CBC, CMP, lipids, A1c.

## 2014-09-13 NOTE — Progress Notes (Signed)
Subjective:    Patient ID: Valerie Curry, female    DOB: [birth year], 68 y.o.   MRN: [id number]  HPI 68YO female presents for annual exam. She is generally feeling well. No concerns today. Working out with a trainer twice per week and exercising 3 additional days per week. Also trying to follow a healthier, low glycemic diet. Recently had colonoscopy which was normal. Mammogram due. PAP was last done in 2013 and was normal.  Review of Systems  Constitutional: Negative for fever, chills, appetite change, fatigue and unexpected weight change.  Eyes: Negative for visual disturbance.  Respiratory: Negative for shortness of breath.   Cardiovascular: Negative for chest pain and leg swelling.  Gastrointestinal: Negative for nausea, vomiting, abdominal pain, diarrhea and constipation.  Musculoskeletal: Negative for arthralgias and myalgias.  Skin: Negative for color change and rash.  Hematological: Negative for adenopathy. Does not bruise/bleed easily.  Psychiatric/Behavioral: Negative for suicidal ideas, sleep disturbance and dysphoric mood. The patient is not nervous/anxious.        Objective:    BP 118/76  Pulse 77  Temp(Src) 98.2 F (36.8 C) (Oral)  Resp 14  Ht 5' 2.5" (1.588 m)  Wt 196 lb 4 oz (89.018 kg)  BMI 35.30 kg/m2  SpO2 99% Physical Exam  Constitutional: She is oriented to person, place, and time. She appears well-developed and well-nourished. No distress.  HENT:  Head: Normocephalic and atraumatic.  Right Ear: External ear normal.  Left Ear: External ear normal.  Nose: Nose normal.  Mouth/Throat: Oropharynx is clear and moist. No oropharyngeal exudate.  Eyes: Conjunctivae are normal. Pupils are equal, round, and reactive to light. Right eye exhibits no discharge. Left eye exhibits no discharge. No scleral icterus.  Neck: Normal range of motion. Neck supple. No tracheal deviation present. No thyromegaly present.  Cardiovascular: Normal rate, regular rhythm,  normal heart sounds and intact distal pulses.  Exam reveals no gallop and no friction rub.   No murmur heard. Pulmonary/Chest: Effort normal and breath sounds normal. No accessory muscle usage. Not tachypneic. No respiratory distress. She has no decreased breath sounds. She has no wheezes. She has no rales. She exhibits no tenderness. Right breast exhibits no inverted nipple, no mass, no nipple discharge, no skin change and no tenderness. Left breast exhibits no inverted nipple, no mass, no nipple discharge, no skin change and no tenderness. Breasts are symmetrical.  Abdominal: Soft. Bowel sounds are normal. She exhibits no distension and no mass. There is no tenderness. There is no rebound and no guarding.  Musculoskeletal: Normal range of motion. She exhibits no edema and no tenderness.  Lymphadenopathy:    She has no cervical adenopathy.  Neurological: She is alert and oriented to person, place, and time. No cranial nerve deficit. She exhibits normal muscle tone. Coordination normal.  Skin: Skin is warm and dry. No rash noted. She is not diaphoretic. No erythema. No pallor.  Psychiatric: She has a normal mood and affect. Her behavior is normal. Judgment and thought content normal.          Assessment & Plan:   Problem List Items Addressed This Visit     Unprioritized   Obesity (BMI 30-39.9)      Wt Readings from Last 3 Encounters:  09/13/14 196 lb 4 oz (89.018 kg)  04/15/14 187 lb (84.823 kg)  04/10/14 187 lb (84.823 kg)   Body mass index is 35.3 kg/(m^2). Encouraged healthy diet and exercise with goal of weight loss.    Routine general  medical examination at a health care facility - Primary     General medical exam including breast exam normal today. PAP and pelvic deferred as normal 2013, plan repeat 2016. Mammogram ordered. Colonoscopy UTD and reviewed. Prevnar vaccine given today. Flu vaccine UTD. Encourage smoking cessation. Discussed screening for lung cancer with CT chest,  and she prefers to hold off on this until next year. Labs ordered including CBC, CMP, lipids, A1c.    Screening for breast cancer   Relevant Orders      MM Digital Screening    Other Visit Diagnoses   Need for prophylactic vaccination against Streptococcus pneumoniae (pneumococcus)        Relevant Orders       Pneumococcal conjugate vaccine 13-valent (Completed)        Return in about 6 months (around 03/15/2015) for Recheck.

## 2014-09-13 NOTE — Patient Instructions (Signed)

## 2014-09-13 NOTE — Progress Notes (Signed)
Pre visit review using our clinic review tool, if applicable. No additional management support is needed unless otherwise documented below in the visit note. 

## 2014-09-13 NOTE — Assessment & Plan Note (Signed)
Wt Readings from Last 3 Encounters:  09/13/14 196 lb 4 oz (89.018 kg)  04/15/14 187 lb (84.823 kg)  04/10/14 187 lb (84.823 kg)   Body mass index is 35.3 kg/(m^2). Encouraged healthy diet and exercise with goal of weight loss.

## 2014-09-16 ENCOUNTER — Telehealth: Payer: Self-pay | Admitting: Adult Health

## 2014-09-16 LAB — LIPID PANEL
Cholesterol: 174 mg/dL (ref 0–200)
HDL: 59 mg/dL (ref 35–70)
LDL Cholesterol: 95 mg/dL
Triglycerides: 101 mg/dL (ref 40–160)

## 2014-09-16 LAB — HEPATIC FUNCTION PANEL
ALT: 22 U/L (ref 7–35)
AST: 22 U/L (ref 13–35)
Alkaline Phosphatase: 78 U/L (ref 25–125)
Bilirubin, Total: 0.5 mg/dL

## 2014-09-16 LAB — BASIC METABOLIC PANEL
BUN: 17 mg/dL (ref 4–21)
Creatinine: 0.8 mg/dL (ref 0.5–1.1)
Glucose: 88 mg/dL
Potassium: 4.3 mmol/L (ref 3.4–5.3)
Sodium: 141 mmol/L (ref 137–147)

## 2014-09-16 LAB — HEMOGLOBIN A1C: Hgb A1c MFr Bld: 5.6 % (ref 4.0–6.0)

## 2014-09-16 LAB — TSH: TSH: 1.28 u[IU]/mL (ref 0.41–5.90)

## 2014-09-16 NOTE — Telephone Encounter (Signed)
emmi emailed °

## 2014-09-18 ENCOUNTER — Encounter: Payer: Self-pay | Admitting: *Deleted

## 2014-09-19 ENCOUNTER — Telehealth: Payer: Self-pay | Admitting: Internal Medicine

## 2014-09-19 NOTE — Telephone Encounter (Signed)
Labs normal.

## 2014-09-30 ENCOUNTER — Encounter: Payer: Self-pay | Admitting: Internal Medicine

## 2014-10-15 ENCOUNTER — Ambulatory Visit: Payer: Self-pay | Admitting: Internal Medicine

## 2014-12-27 ENCOUNTER — Ambulatory Visit (INDEPENDENT_AMBULATORY_CARE_PROVIDER_SITE_OTHER)
Admission: RE | Admit: 2014-12-27 | Discharge: 2014-12-27 | Disposition: A | Discharge status: Home / Self Care | Payer: PPO | Source: Ambulatory Visit | Attending: Nurse Practitioner | Admitting: Nurse Practitioner

## 2014-12-27 ENCOUNTER — Ambulatory Visit (INDEPENDENT_AMBULATORY_CARE_PROVIDER_SITE_OTHER): Payer: PPO | Admitting: Nurse Practitioner

## 2014-12-27 ENCOUNTER — Other Ambulatory Visit: Payer: Self-pay | Admitting: Nurse Practitioner

## 2014-12-27 ENCOUNTER — Encounter: Payer: Self-pay | Admitting: Nurse Practitioner

## 2014-12-27 VITALS — BP 130/72 | HR 77 | Temp 98.3°F | Resp 12 | Ht 63.0 in | Wt 198.1 lb

## 2014-12-27 DIAGNOSIS — M79661 Pain in right lower leg: Secondary | ICD-10-CM

## 2014-12-27 DIAGNOSIS — M25361 Other instability, right knee: Secondary | ICD-10-CM

## 2014-12-27 DIAGNOSIS — M79651 Pain in right thigh: Secondary | ICD-10-CM

## 2014-12-27 MED ORDER — TRAMADOL HCL 50 MG PO TABS: 50.0000 mg | ORAL_TABLET | Freq: Two times a day (BID) | ORAL | Status: DC

## 2014-12-27 NOTE — Progress Notes (Signed)
Pre visit review using our clinic review tool, if applicable. No additional management support is needed unless otherwise documented below in the visit note. 

## 2014-12-27 NOTE — Patient Instructions (Signed)
We will contact you with the results.

## 2014-12-27 NOTE — Progress Notes (Signed)
Subjective:    Patient ID: Valerie Curry, female    DOB: [birth year], 69 y.o.   MRN: [id number]  HPI  [name] is a 69 yo female with a CC of intermittent right leg pain.   1) 3 weeks ago, it intermittent anterior thigh and on front of shin,   Worse with position changes, crossing her leg. Described as a achy and dull pain. Happens either in thigh or shin, not both at same time.  Worst 8/10, 2/10 , no falls, sometimes buckling, and limping  Meloxicam- Unsure if helpful  Tylenol- Helpful   Review of Systems  Constitutional: Negative for fever, chills, diaphoresis and fatigue.  Respiratory: Negative for chest tightness, shortness of breath and wheezing.   Cardiovascular: Negative for chest pain, palpitations and leg swelling.  Gastrointestinal: Negative for nausea, vomiting and diarrhea.  Musculoskeletal: Positive for arthralgias and gait problem. Negative for myalgias, back pain, joint swelling, neck pain and neck stiffness.       Right leg with limping  Skin: Negative for rash.  Neurological: Negative for dizziness, weakness, numbness and headaches.  Psychiatric/Behavioral: The patient is not nervous/anxious.    Past Medical History  Diagnosis Date  . Osteopenia     took Boniva 1 1/2 years in past-doesn't want to restart it  . Positive PPD     as a child. xray was normal.  . Personal history of tobacco use, presenting hazards to health   . Systemic sclerosis   . Lump or mass in breast 2011/2012    benign  . Obesity, unspecified   . Family history of malignant neoplasm of breast     Maternal/paternal aunts and cousin  . Hypertension     History   Social History  . Marital Status: Married    Spouse Name: N/A    Number of Children: 2  . Years of Education: 15   Occupational History  . [employer]   Social History Main Topics  . Smoking status: Current Every Day Smoker -- 0.50 packs/day for 48 years    Types: Cigarettes  .  Smokeless tobacco: Never Used  . Alcohol Use: Yes     Comment: rare  . Drug Use: No  . Sexual Activity: Yes    Birth Control/ Protection: Post-menopausal   Other Topics Concern  . Not on file   Social History Narrative   Pt is 69yo female. Pt was born in [place], moved to [place] when she was 85.  Pt is married to husband of 22 years.  Pt has 2 sons from a previous marriage and 4 step sons and 83 Grandchildren.  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  Pt works out at gym 3/week (2 days a week in a class, 1 day a week with an Dietitian).  Pt enjoys reading, spending time with her family. Pt and her husband are members of [employer] and active in their church.     Past Surgical History  Procedure Laterality Date  . Tonsillectomy  1968  . Colonoscopy  8/05    Dr. Vira Agar  . Bunionectomy  2010  . Appendectomy  1961  . Tubal ligation  1974  . Breast biopsy Right Nov 2011    benign  . Breast biopsy Left Nov 2012    dilated ducts-benign    Family History  Problem Relation Age of Onset  . Lung cancer Father   . Heart disease Father 47  Cardiac Arrest  . Heart disease Mother   . Hypertension Mother   . Stroke Mother   . Breast cancer      Aunts  . Other      hypercoag-family history  . Other Brother     blood clots  . Cancer Maternal Aunt     Breast cancer    No Known Allergies  Current Outpatient Prescriptions on File Prior to Visit  Medication Sig Dispense Refill  . Ascorbic Acid (VITAMIN C) 1000 MG tablet Take 1,000 mg by mouth daily.      Marland Kitchen aspirin 81 MG tablet Take 81 mg by mouth daily.      . Calcium Carbonate-Vit D-Min 600-400 MG-UNIT TABS Take 1 tablet by mouth 2 (two) times daily.      . cholecalciferol (VITAMIN D) 400 UNITS TABS tablet Take 400 Units by mouth daily.    . hydrochlorothiazide (HYDRODIURIL) 25 MG tablet Take 0.5 tablets (12.5 mg total) by mouth daily. 30 tablet 11   No current  facility-administered medications on file prior to visit.      Objective:   Physical Exam  Constitutional: She is oriented to person, place, and time. She appears well-developed and well-nourished. No distress.  BP 130/72 mmHg  Pulse 77  Temp(Src) 98.3 F (36.8 C) (Oral)  Resp 12  Ht 5\' 3"  (1.6 m)  Wt 198 lb 1.9 oz (89.867 kg)  BMI 35.10 kg/m2  SpO2 97%   HENT:  Head: Normocephalic and atraumatic.  Right Ear: External ear normal.  Left Ear: External ear normal.  Cardiovascular: Normal rate, regular rhythm, normal heart sounds and intact distal pulses.  Exam reveals no gallop and no friction rub.   No murmur heard. Pulmonary/Chest: Effort normal and breath sounds normal. No respiratory distress. She has no wheezes. She has no rales. She exhibits no tenderness.  Musculoskeletal: Normal range of motion. She exhibits no edema or tenderness.  Neurological: She is alert and oriented to person, place, and time. No cranial nerve deficit. She exhibits normal muscle tone. Coordination normal.  Skin: Skin is warm and dry. No rash noted. She is not diaphoretic.  Psychiatric: She has a normal mood and affect. Her behavior is normal. Judgment and thought content normal.      Assessment & Plan:

## 2014-12-29 ENCOUNTER — Encounter: Payer: Self-pay | Admitting: Nurse Practitioner

## 2014-12-29 DIAGNOSIS — M79651 Pain in right thigh: Secondary | ICD-10-CM | POA: Insufficient documentation

## 2014-12-29 DIAGNOSIS — M25361 Other instability, right knee: Secondary | ICD-10-CM | POA: Insufficient documentation

## 2014-12-29 DIAGNOSIS — M898X6 Other specified disorders of bone, lower leg: Secondary | ICD-10-CM | POA: Insufficient documentation

## 2014-12-29 NOTE — Assessment & Plan Note (Signed)
Worsening. X-ray tibia/fibula right leg. FU prn.

## 2014-12-29 NOTE — Assessment & Plan Note (Signed)
Worsening. Right knee buckles occasionally. X-ray of right knee.

## 2014-12-29 NOTE — Assessment & Plan Note (Signed)
Worsening. Right knee pain X-ray of right femur.

## 2014-12-31 ENCOUNTER — Telehealth: Payer: Self-pay | Admitting: Nurse Practitioner

## 2014-12-31 ENCOUNTER — Other Ambulatory Visit: Payer: Self-pay | Admitting: Nurse Practitioner

## 2014-12-31 DIAGNOSIS — M79604 Pain in right leg: Secondary | ICD-10-CM

## 2014-12-31 NOTE — Telephone Encounter (Signed)
The patient is aware that she will be contacted by someone in referrals for her appointment.

## 2014-12-31 NOTE — Telephone Encounter (Signed)
The patient wants to be referred to an orthopedic specialist for leg Pain.  She wants to go to [facility] Orthopedic Dr. Latanya Maudlin .

## 2014-12-31 NOTE — Telephone Encounter (Signed)
Placed referral  

## 2015-02-04 ENCOUNTER — Other Ambulatory Visit: Payer: Self-pay | Admitting: Orthopedic Surgery

## 2015-02-04 DIAGNOSIS — M5442 Lumbago with sciatica, left side: Secondary | ICD-10-CM

## 2015-02-04 DIAGNOSIS — M5441 Lumbago with sciatica, right side: Secondary | ICD-10-CM

## 2015-02-14 ENCOUNTER — Inpatient Hospital Stay
Admission: RE | Admit: 2015-02-14 | Discharge: 2015-02-14 | Disposition: A | Discharge status: Home / Self Care | Payer: Self-pay | Source: Ambulatory Visit | Attending: Orthopedic Surgery | Admitting: Orthopedic Surgery

## 2015-02-14 ENCOUNTER — Other Ambulatory Visit: Payer: Self-pay | Admitting: Orthopedic Surgery

## 2015-02-14 DIAGNOSIS — R52 Pain, unspecified: Secondary | ICD-10-CM

## 2015-02-17 ENCOUNTER — Ambulatory Visit
Admission: RE | Admit: 2015-02-17 | Discharge: 2015-02-17 | Disposition: A | Discharge status: Home / Self Care | Payer: PPO | Source: Ambulatory Visit | Attending: Orthopedic Surgery | Admitting: Orthopedic Surgery

## 2015-02-17 ENCOUNTER — Encounter: Payer: Self-pay | Admitting: Nurse Practitioner

## 2015-02-17 DIAGNOSIS — M5441 Lumbago with sciatica, right side: Secondary | ICD-10-CM

## 2015-02-17 DIAGNOSIS — M5442 Lumbago with sciatica, left side: Secondary | ICD-10-CM

## 2015-02-17 MED ORDER — DIAZEPAM 5 MG PO TABS
5.0000 mg | ORAL_TABLET | Freq: Once | ORAL | Status: AC
  Administered 2015-02-17: 5 mg via ORAL

## 2015-02-17 MED ORDER — IOHEXOL 180 MG/ML  SOLN
15.0000 mL | Freq: Once | INTRAMUSCULAR | Status: AC | PRN
  Administered 2015-02-17: 15 mL via INTRATHECAL

## 2015-02-17 NOTE — Discharge Instructions (Signed)
Myelogram Discharge Instructions  1. Go home and rest quietly for the next 24 hours.  It is important to lie flat for the next 24 hours.  Get up only to go to the restroom.  You may lie in the bed or on a couch on your back, your stomach, your left side or your right side.  You may have one pillow under your head.  You may have pillows between your knees while you are on your side or under your knees while you are on your back.  2. DO NOT drive today.  Recline the seat as far back as it will go, while still wearing your seat belt, on the way home.  3. You may get up to go to the bathroom as needed.  You may sit up for 10 minutes to eat.  You may resume your normal diet and medications unless otherwise indicated.  Drink lots of extra fluids today and tomorrow.  4. The incidence of headache, nausea, or vomiting is about 5% (one in 20 patients).  If you develop a headache, lie flat and drink plenty of fluids until the headache goes away.  Caffeinated beverages may be helpful.  If you develop severe nausea and vomiting or a headache that does not go away with flat bed rest, call [phone].  5. You may resume normal activities after your 24 hours of bed rest is over; however, do not exert yourself strongly or do any heavy lifting tomorrow. If when you get up you have a headache when standing, go back to bed and force fluids for another 24 hours.  6. Call your physician for a follow-up appointment.  The results of your myelogram will be sent directly to your physician by the following day.  7. If you have any questions or if complications develop after you arrive home, please call [phone].  Discharge instructions have been explained to the patient.  The patient, or the person responsible for the patient, fully understands these instructions.       May resume Tramadol on February 18, 2015, after 7:30 am.

## 2015-02-17 NOTE — Progress Notes (Signed)
Pt states she has not Tramadol for "weeks". Discharge instructions explained to pt.

## 2015-03-05 ENCOUNTER — Encounter: Payer: Self-pay | Admitting: *Deleted

## 2015-03-25 ENCOUNTER — Encounter: Payer: Self-pay | Admitting: Internal Medicine

## 2015-03-25 ENCOUNTER — Ambulatory Visit (INDEPENDENT_AMBULATORY_CARE_PROVIDER_SITE_OTHER): Payer: PPO | Admitting: Internal Medicine

## 2015-03-25 VITALS — BP 124/72 | HR 74 | Temp 98.1°F | Resp 14 | Ht 63.0 in | Wt 201.0 lb

## 2015-03-25 DIAGNOSIS — Z01818 Encounter for other preprocedural examination: Secondary | ICD-10-CM | POA: Diagnosis not present

## 2015-03-25 DIAGNOSIS — Z0000 Encounter for general adult medical examination without abnormal findings: Secondary | ICD-10-CM | POA: Insufficient documentation

## 2015-03-25 DIAGNOSIS — M545 Low back pain, unspecified: Secondary | ICD-10-CM | POA: Insufficient documentation

## 2015-03-25 NOTE — Patient Instructions (Signed)
Follow up after surgery

## 2015-03-25 NOTE — Assessment & Plan Note (Addendum)
Scheduled for laminectomy with Dr. Rosalia Hammers per pt report. CT lumbar spine showed spinal stenosis with L5 radiculopathy. Continues on Tramadol, Oxycodone, and Meloxicam.

## 2015-03-25 NOTE — Progress Notes (Signed)
Subjective:    Patient ID: Valerie Curry, female    DOB: Feb 11, 1946, 69 y.o.   MRN: [id number]  HPI  69YO female presents for surgical clearance.  Scheduled to have lumbar spine surgery, laminectomy, but no fusion. Having lower back pain which is worsened by standing or walking. No attempt at physical therapy. Taking Meloxicam and Tramadol and prn oxycodone for pain rarely. Plan for surgery next 2-3 weeks.  No prior reaction to anesthesia. No previous issues with bleeding or bruising. No recent illnesses. Not taking any OTC medications aside from what is on her list.  Wt Readings from Last 3 Encounters:  03/25/15 201 lb (91.173 kg)  12/27/14 198 lb 1.9 oz (89.867 kg)  09/13/14 196 lb 4 oz (89.018 kg)     Past medical, surgical, family and social history per today's encounter.  Review of Systems  Constitutional: Negative for fever, chills, appetite change, fatigue and unexpected weight change.  HENT: Negative for congestion.   Eyes: Negative for visual disturbance.  Respiratory: Negative for cough, shortness of breath and wheezing.   Cardiovascular: Negative for chest pain, palpitations and leg swelling.  Gastrointestinal: Negative for abdominal pain, diarrhea and constipation.  Genitourinary: Negative for dysuria.  Musculoskeletal: Positive for myalgias, back pain and arthralgias.  Skin: Negative for color change and rash.  Hematological: Negative for adenopathy. Does not bruise/bleed easily.  Psychiatric/Behavioral: Negative for dysphoric mood. The patient is not nervous/anxious.        Objective:    BP 124/72 mmHg  Pulse 74  Temp(Src) 98.1 F (36.7 C) (Oral)  Resp 14  Ht 5\' 3"  (1.6 m)  Wt 201 lb (91.173 kg)  BMI 35.61 kg/m2  SpO2 97% Physical Exam  Constitutional: She is oriented to person, place, and time. She appears well-developed and well-nourished. No distress.  HENT:  Head: Normocephalic and atraumatic.  Right Ear: External ear normal.  Left Ear:  External ear normal.  Nose: Nose normal.  Mouth/Throat: Oropharynx is clear and moist. No oropharyngeal exudate.  Eyes: Conjunctivae are normal. Pupils are equal, round, and reactive to light. Right eye exhibits no discharge. Left eye exhibits no discharge. No scleral icterus.  Neck: Normal range of motion. Neck supple. No tracheal deviation present. No thyromegaly present.  Cardiovascular: Normal rate, regular rhythm, normal heart sounds and intact distal pulses.  Exam reveals no gallop and no friction rub.   No murmur heard. Pulmonary/Chest: Effort normal and breath sounds normal. No accessory muscle usage. No respiratory distress. She has no decreased breath sounds. She has no wheezes. She has no rhonchi. She has no rales. She exhibits no tenderness.  Musculoskeletal: Normal range of motion. She exhibits no edema or tenderness.  Lymphadenopathy:    She has no cervical adenopathy.  Neurological: She is alert and oriented to person, place, and time. No cranial nerve deficit. She exhibits normal muscle tone. Coordination normal.  Skin: Skin is warm and dry. No rash noted. She is not diaphoretic. No erythema. No pallor.  Psychiatric: She has a normal mood and affect. Her behavior is normal. Judgment and thought content normal.          Assessment & Plan:   Problem List Items Addressed This Visit      Unprioritized   Lumbago    Scheduled for laminectomy with Dr. Rosalia Hammers per pt report. CT lumbar spine showed spinal stenosis with L5 radiculopathy. Continues on Tramadol, Oxycodone, and Meloxicam.      Pre-op evaluation - Primary    Pt would  be low risk by modified criteria for surgery. Exam and EKG normal today. Will check CMP, CBC. Recommend to proceed with surgery.      Relevant Orders   EKG 12-Lead (Completed)   Comprehensive metabolic panel   CBC       Return in about 3 months (around 06/24/2015) for Recheck.

## 2015-03-25 NOTE — Progress Notes (Signed)
Pre visit review using our clinic review tool, if applicable. No additional management support is needed unless otherwise documented below in the visit note. 

## 2015-03-25 NOTE — Assessment & Plan Note (Signed)
Pt would be low risk by modified criteria for surgery. Exam and EKG normal today. Will check CMP, CBC. Recommend to proceed with surgery.

## 2015-03-26 LAB — CBC
HCT: 39.8 % (ref 36.0–46.0)
Hemoglobin: 13.7 g/dL (ref 12.0–15.0)
MCHC: 34.4 g/dL (ref 30.0–36.0)
MCV: 90.7 fl (ref 78.0–100.0)
Platelets: 230 10*3/uL (ref 150.0–400.0)
RBC: 4.39 Mil/uL (ref 3.87–5.11)
RDW: 13.4 % (ref 11.5–15.5)
WBC: 6.1 10*3/uL (ref 4.0–10.5)

## 2015-03-26 LAB — COMPREHENSIVE METABOLIC PANEL WITH GFR
ALT: 14 U/L (ref 0–35)
AST: 20 U/L (ref 0–37)
Albumin: 4.1 g/dL (ref 3.5–5.2)
Alkaline Phosphatase: 62 U/L (ref 39–117)
BUN: 22 mg/dL (ref 6–23)
CO2: 32 meq/L (ref 19–32)
Calcium: 9.4 mg/dL (ref 8.4–10.5)
Chloride: 103 meq/L (ref 96–112)
Creatinine, Ser: 1.29 mg/dL — ABNORMAL HIGH (ref 0.40–1.20)
GFR: 43.64 mL/min — ABNORMAL LOW
Glucose, Bld: 85 mg/dL (ref 70–99)
Potassium: 3.8 meq/L (ref 3.5–5.1)
Sodium: 138 meq/L (ref 135–145)
Total Bilirubin: 0.3 mg/dL (ref 0.2–1.2)
Total Protein: 6.2 g/dL (ref 6.0–8.3)

## 2015-04-01 ENCOUNTER — Other Ambulatory Visit: Payer: Self-pay | Admitting: Surgical

## 2015-04-21 NOTE — Patient Instructions (Signed)
[place]  04/21/2015   Your procedure is scheduled on: April 30, 2015  Report to [facility] Main  Entrance and follow signs to               [place] at 6:30  AM.  Call this number if you have problems the morning of surgery [phone]   Remember: ONLY 1 PERSON MAY GO WITH YOU TO SHORT STAY TO GET  READY MORNING OF [place].  Do not eat food or drink liquids :After Midnight.     Take these medicines the morning of surgery with A SIP OF WATER: None                               You may not have any metal on your body including hair pins and              piercings  Do not wear jewelry, make-up, lotions, powders or perfumes, deodorant             Do not wear nail polish.  Do not shave  48 hours prior to surgery.    .   Do not bring valuables to the hospital. [place].  Contacts, dentures or bridgework may not be worn into surgery.  Leave suitcase in the car. After surgery it may be brought to your room.    . :  Special Instructions: coughing and deep breathing, leg exercises              Please read over the following fact sheets you were given: _____________________________________________________________________             [facility] - Preparing for Surgery Before surgery, you can play an important role.  Because skin is not sterile, your skin needs to be as free of germs as possible.  You can reduce the number of germs on your skin by washing with CHG (chlorahexidine gluconate) soap before surgery.  CHG is an antiseptic cleaner which kills germs and bonds with the skin to continue killing germs even after washing. Please DO NOT use if you have an allergy to CHG or antibacterial soaps.  If your skin becomes reddened/irritated stop using the CHG and inform your nurse when you arrive at Short Stay. Do not shave (including legs and underarms) for at least 48 hours prior to the first CHG  shower.  You may shave your face/neck. Please follow these instructions carefully:  1.  Shower with CHG Soap the night before surgery and the  morning of Surgery.  2.  If you choose to wash your hair, wash your hair first as usual with your  normal  shampoo.  3.  After you shampoo, rinse your hair and body thoroughly to remove the  shampoo.                           4.  Use CHG as you would any other liquid soap.  You can apply chg directly  to the skin and wash                       Gently with a scrungie or clean washcloth.  5.  Apply the CHG Soap to your body ONLY FROM THE NECK DOWN.   Do not use on face/ open                           Wound or open sores. Avoid contact with eyes, ears mouth and genitals (private parts).                       Wash face,  Genitals (private parts) with your normal soap.             6.  Wash thoroughly, paying special attention to the area where your surgery  will be performed.  7.  Thoroughly rinse your body with warm water from the neck down.  8.  DO NOT shower/wash with your normal soap after using and rinsing off  the CHG Soap.                9.  Pat yourself dry with a clean towel.            10.  Wear clean pajamas.            11.  Place clean sheets on your bed the night of your first shower and do not  sleep with pets. Day of Surgery : Do not apply any lotions/deodorants the morning of surgery.  Please wear clean clothes to the hospital/surgery center.  FAILURE TO FOLLOW THESE INSTRUCTIONS MAY RESULT IN THE CANCELLATION OF YOUR SURGERY PATIENT SIGNATURE_________________________________  NURSE SIGNATURE__________________________________  ________________________________________________________________________  WHAT IS A BLOOD TRANSFUSION? Blood Transfusion Information  A transfusion is the replacement of blood or some of its parts. Blood is made up of multiple cells which provide different functions.  Red blood cells carry oxygen and are used for  blood loss replacement.  White blood cells fight against infection.  Platelets control bleeding.  Plasma helps clot blood.  Other blood products are available for specialized needs, such as hemophilia or other clotting disorders. BEFORE THE TRANSFUSION  Who gives blood for transfusions?   Healthy volunteers who are fully evaluated to make sure their blood is safe. This is blood bank blood. Transfusion therapy is the safest it has ever been in the practice of medicine. Before blood is taken from a donor, a complete history is taken to make sure that person has no history of diseases nor engages in risky social behavior (examples are intravenous drug use or sexual activity with multiple partners). The donor's travel history is screened to minimize risk of transmitting infections, such as malaria. The donated blood is tested for signs of infectious diseases, such as HIV and hepatitis. The blood is then tested to be sure it is compatible with you in order to minimize the chance of a transfusion reaction. If you or a relative donates blood, this is often done in anticipation of surgery and is not appropriate for emergency situations. It takes many days to process the donated blood. RISKS AND COMPLICATIONS Although transfusion therapy is very safe and saves many lives, the main dangers of transfusion include:   Getting an infectious disease.  Developing a transfusion reaction. This is an allergic reaction to something in the blood you were given. Every precaution is taken to prevent this. The decision to have a blood transfusion has been considered carefully by your caregiver before blood is given. Blood is not given unless the benefits outweigh the risks. AFTER THE TRANSFUSION  Right after receiving a  blood transfusion, you will usually feel much better and more energetic. This is especially true if your red blood cells have gotten low (anemic). The transfusion raises the level of the red blood  cells which carry oxygen, and this usually causes an energy increase.  The nurse administering the transfusion will monitor you carefully for complications. HOME CARE INSTRUCTIONS  No special instructions are needed after a transfusion. You may find your energy is better. Speak with your caregiver about any limitations on activity for underlying diseases you may have. SEEK MEDICAL CARE IF:   Your condition is not improving after your transfusion.  You develop redness or irritation at the intravenous (IV) site. SEEK IMMEDIATE MEDICAL CARE IF:  Any of the following symptoms occur over the next 12 hours:  Shaking chills.  You have a temperature by mouth above 102 F (38.9 C), not controlled by medicine.  Chest, back, or muscle pain.  People around you feel you are not acting correctly or are confused.  Shortness of breath or difficulty breathing.  Dizziness and fainting.  You get a rash or develop hives.  You have a decrease in urine output.  Your urine turns a dark color or changes to pink, red, or brown. Any of the following symptoms occur over the next 10 days:  You have a temperature by mouth above 102 F (38.9 C), not controlled by medicine.  Shortness of breath.  Weakness after normal activity.  The white part of the eye turns yellow (jaundice).  You have a decrease in the amount of urine or are urinating less often.  Your urine turns a dark color or changes to pink, red, or brown. Document Released: 11/12/2000 Document Revised: 02/07/2012 Document Reviewed: 07/01/2008 [facility] Patient Information 2014 [place], [place].  _______________________________________________________________________

## 2015-04-22 ENCOUNTER — Encounter (HOSPITAL_COMMUNITY)
Admission: RE | Admit: 2015-04-22 | Discharge: 2015-04-22 | Disposition: A | Discharge status: Home / Self Care | Payer: PPO | Source: Ambulatory Visit | Attending: Orthopedic Surgery | Admitting: Orthopedic Surgery

## 2015-04-22 ENCOUNTER — Encounter (HOSPITAL_COMMUNITY): Payer: Self-pay

## 2015-04-22 ENCOUNTER — Ambulatory Visit (HOSPITAL_COMMUNITY)
Admission: RE | Admit: 2015-04-22 | Discharge: 2015-04-22 | Disposition: A | Discharge status: Home / Self Care | Payer: PPO | Source: Ambulatory Visit | Attending: Surgical | Admitting: Surgical

## 2015-04-22 ENCOUNTER — Ambulatory Visit (HOSPITAL_COMMUNITY): Admission: RE | Admit: 2015-04-22 | Payer: PPO | Source: Ambulatory Visit

## 2015-04-22 DIAGNOSIS — Z01818 Encounter for other preprocedural examination: Secondary | ICD-10-CM | POA: Insufficient documentation

## 2015-04-22 DIAGNOSIS — M545 Low back pain: Secondary | ICD-10-CM | POA: Insufficient documentation

## 2015-04-22 DIAGNOSIS — I10 Essential (primary) hypertension: Secondary | ICD-10-CM | POA: Diagnosis not present

## 2015-04-22 DIAGNOSIS — G8929 Other chronic pain: Secondary | ICD-10-CM | POA: Diagnosis not present

## 2015-04-22 DIAGNOSIS — Z01812 Encounter for preprocedural laboratory examination: Secondary | ICD-10-CM | POA: Diagnosis not present

## 2015-04-22 HISTORY — DX: Unspecified osteoarthritis, unspecified site: M19.90

## 2015-04-22 LAB — URINALYSIS, ROUTINE W REFLEX MICROSCOPIC
Bilirubin Urine: NEGATIVE
Glucose, UA: NEGATIVE mg/dL
Ketones, ur: NEGATIVE mg/dL
Leukocytes, UA: NEGATIVE
Nitrite: NEGATIVE
Protein, ur: NEGATIVE mg/dL
Specific Gravity, Urine: 1.005 (ref 1.005–1.030)
Urobilinogen, UA: 0.2 mg/dL (ref 0.0–1.0)
pH: 6.5 (ref 5.0–8.0)

## 2015-04-22 LAB — CBC WITH DIFFERENTIAL/PLATELET
Basophils Absolute: 0 10*3/uL (ref 0.0–0.1)
Basophils Relative: 0 % (ref 0–1)
Eosinophils Absolute: 0.1 10*3/uL (ref 0.0–0.7)
Eosinophils Relative: 1 % (ref 0–5)
HCT: 42.5 % (ref 36.0–46.0)
Hemoglobin: 14.2 g/dL (ref 12.0–15.0)
Lymphocytes Relative: 23 % (ref 12–46)
Lymphs Abs: 1.3 10*3/uL (ref 0.7–4.0)
MCH: 30.9 pg (ref 26.0–34.0)
MCHC: 33.4 g/dL (ref 30.0–36.0)
MCV: 92.6 fL (ref 78.0–100.0)
Monocytes Absolute: 0.4 10*3/uL (ref 0.1–1.0)
Monocytes Relative: 7 % (ref 3–12)
Neutro Abs: 3.8 10*3/uL (ref 1.7–7.7)
Neutrophils Relative %: 69 % (ref 43–77)
Platelets: 251 10*3/uL (ref 150–400)
RBC: 4.59 MIL/uL (ref 3.87–5.11)
RDW: 13.1 % (ref 11.5–15.5)
WBC: 5.6 10*3/uL (ref 4.0–10.5)

## 2015-04-22 LAB — COMPREHENSIVE METABOLIC PANEL
ALT: 16 U/L (ref 14–54)
AST: 22 U/L (ref 15–41)
Albumin: 3.9 g/dL (ref 3.5–5.0)
Alkaline Phosphatase: 63 U/L (ref 38–126)
Anion gap: 7 (ref 5–15)
BUN: 23 mg/dL — ABNORMAL HIGH (ref 6–20)
CO2: 29 mmol/L (ref 22–32)
Calcium: 9.1 mg/dL (ref 8.9–10.3)
Chloride: 106 mmol/L (ref 101–111)
Creatinine, Ser: 0.59 mg/dL (ref 0.44–1.00)
GFR calc Af Amer: >60 mL/min (ref >60)
GFR calc non Af Amer: >60 mL/min (ref >60)
Glucose, Bld: 94 mg/dL (ref 65–99)
Potassium: 4.5 mmol/L (ref 3.5–5.1)
Sodium: 142 mmol/L (ref 135–145)
Total Bilirubin: 0.3 mg/dL (ref 0.3–1.2)
Total Protein: 6.9 g/dL (ref 6.5–8.1)

## 2015-04-22 LAB — ABO/RH: ABO/RH(D): A POS

## 2015-04-22 LAB — SURGICAL PCR SCREEN
MRSA, PCR: NEGATIVE
Staphylococcus aureus: NEGATIVE

## 2015-04-22 LAB — PROTIME-INR
INR: 0.94 (ref 0.00–1.49)
Prothrombin Time: 12.8 seconds (ref 11.6–15.2)

## 2015-04-22 LAB — URINE MICROSCOPIC-ADD ON

## 2015-04-22 NOTE — Progress Notes (Signed)
U/A/micro and CMP results done 04/22/15 faxed via EPIC to Dr Gladstone Lighter.

## 2015-04-22 NOTE — Progress Notes (Signed)
EKG- 03/25/15 EPIC  Clearance on chart- 03/25/15- DR Ronette Deter

## 2015-04-29 NOTE — Anesthesia Preprocedure Evaluation (Signed)
Anesthesia Evaluation  Patient identified by MRN, date of birth, ID band Patient awake    Reviewed: Allergy & Precautions, H&P , NPO status , Patient's Chart, lab work & pertinent test results, reviewed documented beta blocker date and time   Airway Mallampati: II  TM Distance: >3 FB Neck ROM: full    Dental no notable dental hx.    Pulmonary neg pulmonary ROS, Current Smoker,  breath sounds clear to auscultation  Pulmonary exam normal       Cardiovascular Exercise Tolerance: Good hypertension, Pt. on medications Normal cardiovascular examRhythm:regular Rate:Normal     Neuro/Psych negative neurological ROS  negative psych ROS   GI/Hepatic negative GI ROS, Neg liver ROS,   Endo/Other  negative endocrine ROS  Renal/GU negative Renal ROS  negative genitourinary   Musculoskeletal   Abdominal   Peds  Hematology negative hematology ROS (+)   Anesthesia Other Findings Systemic sclerosis  Reproductive/Obstetrics negative OB ROS                             Anesthesia Physical Anesthesia Plan  ASA: II  Anesthesia Plan: General   Post-op Pain Management:    Induction: Intravenous  Airway Management Planned: Oral ETT  Additional Equipment:   Intra-op Plan:   Post-operative Plan: Extubation in OR  Informed Consent: I have reviewed the patients History and Physical, chart, labs and discussed the procedure including the risks, benefits and alternatives for the proposed anesthesia with the patient or authorized representative who has indicated his/her understanding and acceptance.   Dental Advisory Given  Plan Discussed with: CRNA and Surgeon  Anesthesia Plan Comments:         Anesthesia Quick Evaluation

## 2015-04-30 ENCOUNTER — Ambulatory Visit (HOSPITAL_COMMUNITY): Payer: PPO

## 2015-04-30 ENCOUNTER — Encounter (HOSPITAL_COMMUNITY): Payer: Self-pay | Admitting: *Deleted

## 2015-04-30 ENCOUNTER — Ambulatory Visit (HOSPITAL_COMMUNITY): Payer: PPO | Admitting: Anesthesiology

## 2015-04-30 ENCOUNTER — Observation Stay (HOSPITAL_COMMUNITY)
Admission: RE | Admit: 2015-04-30 | Discharge: 2015-05-01 | Disposition: A | Discharge status: Home / Self Care | Payer: PPO | Source: Ambulatory Visit | Attending: Orthopedic Surgery | Admitting: Orthopedic Surgery

## 2015-04-30 ENCOUNTER — Encounter (HOSPITAL_COMMUNITY)
Admission: RE | Disposition: A | Discharge status: Home / Self Care | Payer: Self-pay | Source: Ambulatory Visit | Attending: Orthopedic Surgery

## 2015-04-30 DIAGNOSIS — M545 Low back pain, unspecified: Secondary | ICD-10-CM

## 2015-04-30 DIAGNOSIS — Z9851 Tubal ligation status: Secondary | ICD-10-CM | POA: Insufficient documentation

## 2015-04-30 DIAGNOSIS — Z803 Family history of malignant neoplasm of breast: Secondary | ICD-10-CM | POA: Diagnosis not present

## 2015-04-30 DIAGNOSIS — F17210 Nicotine dependence, cigarettes, uncomplicated: Secondary | ICD-10-CM | POA: Insufficient documentation

## 2015-04-30 DIAGNOSIS — M349 Systemic sclerosis, unspecified: Secondary | ICD-10-CM | POA: Diagnosis not present

## 2015-04-30 DIAGNOSIS — E669 Obesity, unspecified: Secondary | ICD-10-CM | POA: Diagnosis not present

## 2015-04-30 DIAGNOSIS — I10 Essential (primary) hypertension: Secondary | ICD-10-CM | POA: Diagnosis not present

## 2015-04-30 DIAGNOSIS — M1990 Unspecified osteoarthritis, unspecified site: Secondary | ICD-10-CM | POA: Diagnosis not present

## 2015-04-30 DIAGNOSIS — Z7982 Long term (current) use of aspirin: Secondary | ICD-10-CM | POA: Insufficient documentation

## 2015-04-30 DIAGNOSIS — M48062 Spinal stenosis, lumbar region with neurogenic claudication: Secondary | ICD-10-CM

## 2015-04-30 DIAGNOSIS — M8580 Other specified disorders of bone density and structure, unspecified site: Secondary | ICD-10-CM | POA: Insufficient documentation

## 2015-04-30 DIAGNOSIS — M21379 Foot drop, unspecified foot: Secondary | ICD-10-CM | POA: Insufficient documentation

## 2015-04-30 DIAGNOSIS — Z9049 Acquired absence of other specified parts of digestive tract: Secondary | ICD-10-CM | POA: Diagnosis not present

## 2015-04-30 DIAGNOSIS — M4806 Spinal stenosis, lumbar region: Secondary | ICD-10-CM | POA: Diagnosis not present

## 2015-04-30 HISTORY — DX: Spinal stenosis, lumbar region with neurogenic claudication: M48.062

## 2015-04-30 HISTORY — PX: DECOMPRESSIVE LUMBAR LAMINECTOMY LEVEL 2: SHX5792

## 2015-04-30 LAB — TYPE AND SCREEN
ABO/RH(D): A POS
Antibody Screen: NEGATIVE

## 2015-04-30 SURGERY — DECOMPRESSIVE LUMBAR LAMINECTOMY LEVEL 2
Anesthesia: General | Site: Back

## 2015-04-30 MED ORDER — ROCURONIUM BROMIDE 100 MG/10ML IV SOLN
INTRAVENOUS | Status: DC | PRN
  Administered 2015-04-30: 30 mg via INTRAVENOUS
  Administered 2015-04-30: 20 mg via INTRAVENOUS

## 2015-04-30 MED ORDER — CEFAZOLIN SODIUM-DEXTROSE 2-3 GM-% IV SOLR
INTRAVENOUS | Status: AC
  Filled 2015-04-30: qty 50

## 2015-04-30 MED ORDER — HYDROCHLOROTHIAZIDE 25 MG PO TABS
12.5000 mg | ORAL_TABLET | Freq: Every day | ORAL | Status: DC
  Filled 2015-04-30: qty 0.5

## 2015-04-30 MED ORDER — ONDANSETRON HCL 4 MG/2ML IJ SOLN
INTRAMUSCULAR | Status: AC
  Filled 2015-04-30: qty 2

## 2015-04-30 MED ORDER — MIDAZOLAM HCL 2 MG/2ML IJ SOLN
INTRAMUSCULAR | Status: AC
  Filled 2015-04-30: qty 2

## 2015-04-30 MED ORDER — MIDAZOLAM HCL 5 MG/5ML IJ SOLN
INTRAMUSCULAR | Status: DC | PRN
  Administered 2015-04-30: 2 mg via INTRAVENOUS

## 2015-04-30 MED ORDER — ACETAMINOPHEN 325 MG PO TABS
650.0000 mg | ORAL_TABLET | ORAL | Status: DC | PRN
  Administered 2015-04-30 – 2015-05-01 (×3): 650 mg via ORAL
  Filled 2015-04-30 (×3): qty 2

## 2015-04-30 MED ORDER — HYDROMORPHONE HCL 1 MG/ML IJ SOLN: 0.2500 mg | INTRAMUSCULAR | Status: DC | PRN

## 2015-04-30 MED ORDER — BISACODYL 5 MG PO TBEC: 5.0000 mg | DELAYED_RELEASE_TABLET | Freq: Every day | ORAL | Status: DC | PRN

## 2015-04-30 MED ORDER — PHENOL 1.4 % MT LIQD: 1.0000 | OROMUCOSAL | Status: DC | PRN

## 2015-04-30 MED ORDER — CEFAZOLIN SODIUM-DEXTROSE 2-3 GM-% IV SOLR
2.0000 g | INTRAVENOUS | Status: AC
  Administered 2015-04-30: 2 g via INTRAVENOUS

## 2015-04-30 MED ORDER — MENTHOL 3 MG MT LOZG: 1.0000 | LOZENGE | OROMUCOSAL | Status: DC | PRN

## 2015-04-30 MED ORDER — FENTANYL CITRATE (PF) 100 MCG/2ML IJ SOLN
INTRAMUSCULAR | Status: AC
  Filled 2015-04-30: qty 2

## 2015-04-30 MED ORDER — SODIUM CHLORIDE 0.9 % IR SOLN
Status: AC
  Filled 2015-04-30: qty 1

## 2015-04-30 MED ORDER — HYDROCODONE-ACETAMINOPHEN 5-325 MG PO TABS: 1.0000 | ORAL_TABLET | ORAL | Status: DC | PRN

## 2015-04-30 MED ORDER — BACITRACIN ZINC 500 UNIT/GM EX OINT
TOPICAL_OINTMENT | CUTANEOUS | Status: AC
  Filled 2015-04-30: qty 28.35

## 2015-04-30 MED ORDER — POLYETHYLENE GLYCOL 3350 17 G PO PACK: 17.0000 g | PACK | Freq: Every day | ORAL | Status: DC | PRN

## 2015-04-30 MED ORDER — OXYCODONE-ACETAMINOPHEN 5-325 MG PO TABS: 1.0000 | ORAL_TABLET | ORAL | Status: DC | PRN

## 2015-04-30 MED ORDER — BUPIVACAINE LIPOSOME 1.3 % IJ SUSP
20.0000 mL | Freq: Once | INTRAMUSCULAR | Status: AC
Start: 2015-04-30 — End: 2015-04-30
  Administered 2015-04-30: 20 mL
  Filled 2015-04-30: qty 20

## 2015-04-30 MED ORDER — HYDROMORPHONE HCL 1 MG/ML IJ SOLN: 0.5000 mg | INTRAMUSCULAR | Status: DC | PRN

## 2015-04-30 MED ORDER — HYDROCHLOROTHIAZIDE 12.5 MG PO CAPS
12.5000 mg | ORAL_CAPSULE | Freq: Every day | ORAL | Status: DC
  Filled 2015-04-30 (×2): qty 1

## 2015-04-30 MED ORDER — FLEET ENEMA 7-19 GM/118ML RE ENEM: 1.0000 | ENEMA | Freq: Once | RECTAL | Status: AC | PRN

## 2015-04-30 MED ORDER — LIDOCAINE HCL (CARDIAC) 20 MG/ML IV SOLN
INTRAVENOUS | Status: AC
  Filled 2015-04-30: qty 5

## 2015-04-30 MED ORDER — FENTANYL CITRATE (PF) 250 MCG/5ML IJ SOLN
INTRAMUSCULAR | Status: AC
  Filled 2015-04-30: qty 5

## 2015-04-30 MED ORDER — NEOSTIGMINE METHYLSULFATE 10 MG/10ML IV SOLN
INTRAVENOUS | Status: AC
  Filled 2015-04-30: qty 1

## 2015-04-30 MED ORDER — LACTATED RINGERS IV SOLN
INTRAVENOUS | Status: DC
  Administered 2015-04-30 (×2): via INTRAVENOUS

## 2015-04-30 MED ORDER — METHOCARBAMOL 500 MG PO TABS: 500.0000 mg | ORAL_TABLET | Freq: Four times a day (QID) | ORAL | Status: DC | PRN

## 2015-04-30 MED ORDER — GLYCOPYRROLATE 0.2 MG/ML IJ SOLN
INTRAMUSCULAR | Status: AC
  Filled 2015-04-30: qty 3

## 2015-04-30 MED ORDER — THROMBIN 5000 UNITS EX SOLR
CUTANEOUS | Status: AC
  Filled 2015-04-30: qty 10000

## 2015-04-30 MED ORDER — ROCURONIUM BROMIDE 100 MG/10ML IV SOLN
INTRAVENOUS | Status: AC
  Filled 2015-04-30: qty 1

## 2015-04-30 MED ORDER — ONDANSETRON HCL 4 MG/2ML IJ SOLN
INTRAMUSCULAR | Status: DC | PRN
  Administered 2015-04-30: 4 mg via INTRAVENOUS

## 2015-04-30 MED ORDER — PROPOFOL 10 MG/ML IV BOLUS
INTRAVENOUS | Status: AC
  Filled 2015-04-30: qty 20

## 2015-04-30 MED ORDER — THROMBIN 5000 UNITS EX SOLR
CUTANEOUS | Status: DC | PRN
  Administered 2015-04-30: 10000 [IU] via TOPICAL

## 2015-04-30 MED ORDER — BUPIVACAINE-EPINEPHRINE 0.5% -1:200000 IJ SOLN
INTRAMUSCULAR | Status: AC
  Filled 2015-04-30: qty 1

## 2015-04-30 MED ORDER — LACTATED RINGERS IV SOLN: INTRAVENOUS | Status: DC

## 2015-04-30 MED ORDER — HYDROMORPHONE HCL 1 MG/ML IJ SOLN
INTRAMUSCULAR | Status: DC | PRN
  Administered 2015-04-30 (×2): 1 mg via INTRAVENOUS

## 2015-04-30 MED ORDER — CEFAZOLIN SODIUM 1-5 GM-% IV SOLN
1.0000 g | Freq: Three times a day (TID) | INTRAVENOUS | Status: AC
  Administered 2015-04-30 – 2015-05-01 (×3): 1 g via INTRAVENOUS
  Filled 2015-04-30 (×4): qty 50

## 2015-04-30 MED ORDER — OXYCODONE-ACETAMINOPHEN 5-325 MG PO TABS
1.0000 | ORAL_TABLET | ORAL | Status: DC | PRN
Start: 2015-04-30 — End: 2015-05-01
  Administered 2015-05-01 (×3): 1 via ORAL
  Filled 2015-04-30 (×3): qty 1

## 2015-04-30 MED ORDER — SUFENTANIL CITRATE 50 MCG/ML IV SOLN
INTRAVENOUS | Status: AC
  Filled 2015-04-30: qty 1

## 2015-04-30 MED ORDER — SUCCINYLCHOLINE CHLORIDE 20 MG/ML IJ SOLN
INTRAMUSCULAR | Status: DC | PRN
  Administered 2015-04-30: 100 mg via INTRAVENOUS

## 2015-04-30 MED ORDER — HYDROMORPHONE HCL 2 MG/ML IJ SOLN
INTRAMUSCULAR | Status: AC
  Filled 2015-04-30: qty 1

## 2015-04-30 MED ORDER — FENTANYL CITRATE (PF) 250 MCG/5ML IJ SOLN
INTRAMUSCULAR | Status: DC | PRN
  Administered 2015-04-30: 50 ug via INTRAVENOUS
  Administered 2015-04-30 (×2): 100 ug via INTRAVENOUS

## 2015-04-30 MED ORDER — DEXTROSE 5 % IV SOLN
500.0000 mg | Freq: Four times a day (QID) | INTRAVENOUS | Status: DC | PRN
  Administered 2015-04-30: 500 mg via INTRAVENOUS
  Filled 2015-04-30 (×2): qty 5

## 2015-04-30 MED ORDER — ACETAMINOPHEN 650 MG RE SUPP
650.0000 mg | RECTAL | Status: DC | PRN
Start: 2015-04-30 — End: 2015-05-01
  Filled 2015-04-30: qty 1

## 2015-04-30 MED ORDER — BACITRACIN-NEOMYCIN-POLYMYXIN OINTMENT TUBE
TOPICAL_OINTMENT | CUTANEOUS | Status: DC | PRN
  Administered 2015-04-30: 1 via TOPICAL

## 2015-04-30 MED ORDER — SODIUM CHLORIDE 0.9 % IR SOLN
Status: DC | PRN
  Administered 2015-04-30: 500 mL

## 2015-04-30 MED ORDER — ONDANSETRON HCL 4 MG/2ML IJ SOLN: 4.0000 mg | INTRAMUSCULAR | Status: DC | PRN

## 2015-04-30 MED ORDER — GLYCOPYRROLATE 0.2 MG/ML IJ SOLN
INTRAMUSCULAR | Status: DC | PRN
  Administered 2015-04-30: 0.6 mg via INTRAVENOUS

## 2015-04-30 MED ORDER — SODIUM CHLORIDE 0.9 % IJ SOLN
INTRAMUSCULAR | Status: AC
  Filled 2015-04-30: qty 10

## 2015-04-30 MED ORDER — PROPOFOL 10 MG/ML IV BOLUS
INTRAVENOUS | Status: DC | PRN
  Administered 2015-04-30: 150 mg via INTRAVENOUS

## 2015-04-30 MED ORDER — BACITRACIN-NEOMYCIN-POLYMYXIN 400-5-5000 EX OINT
TOPICAL_OINTMENT | CUTANEOUS | Status: AC
  Filled 2015-04-30: qty 1

## 2015-04-30 MED ORDER — LIDOCAINE HCL (CARDIAC) 20 MG/ML IV SOLN
INTRAVENOUS | Status: DC | PRN
  Administered 2015-04-30: 30 mg via INTRAVENOUS

## 2015-04-30 MED ORDER — LACTATED RINGERS IV SOLN
INTRAVENOUS | Status: DC
  Administered 2015-04-30: 15:00:00 via INTRAVENOUS

## 2015-04-30 MED ORDER — BUPIVACAINE-EPINEPHRINE (PF) 0.5% -1:200000 IJ SOLN
INTRAMUSCULAR | Status: DC | PRN
  Administered 2015-04-30: 20 mL

## 2015-04-30 MED ORDER — NEOSTIGMINE METHYLSULFATE 10 MG/10ML IV SOLN
INTRAVENOUS | Status: DC | PRN
  Administered 2015-04-30: 3 mg via INTRAVENOUS

## 2015-04-30 SURGICAL SUPPLY — 46 items
BAG ZIPLOCK 12X15 (MISCELLANEOUS) ×2 IMPLANT
BENZOIN TINCTURE PRP APPL 2/3 (GAUZE/BANDAGES/DRESSINGS) ×2 IMPLANT
CLEANER TIP ELECTROSURG 2X2 (MISCELLANEOUS) ×2 IMPLANT
DRAIN PENROSE 18X1/4 LTX STRL (WOUND CARE) IMPLANT
DRAPE MICROSCOPE LEICA (MISCELLANEOUS) ×2 IMPLANT
DRAPE POUCH INSTRU U-SHP 10X18 (DRAPES) ×2 IMPLANT
DRAPE SHEET LG 3/4 BI-LAMINATE (DRAPES) ×2 IMPLANT
DRAPE SURG 17X11 SM STRL (DRAPES) ×2 IMPLANT
DRSG ADAPTIC 3X8 NADH LF (GAUZE/BANDAGES/DRESSINGS) ×2 IMPLANT
DRSG PAD ABDOMINAL 8X10 ST (GAUZE/BANDAGES/DRESSINGS) ×6 IMPLANT
DURAPREP 26ML APPLICATOR (WOUND CARE) ×2 IMPLANT
ELECT BLADE TIP CTD 4 INCH (ELECTRODE) ×2 IMPLANT
ELECT REM PT RETURN 9FT ADLT (ELECTROSURGICAL) ×2
ELECTRODE REM PT RTRN 9FT ADLT (ELECTROSURGICAL) ×1 IMPLANT
FLOSEAL 10ML (HEMOSTASIS) IMPLANT
GAUZE SPONGE 4X4 16PLY XRAY LF (GAUZE/BANDAGES/DRESSINGS) ×2 IMPLANT
GLOVE BIOGEL PI IND STRL 8 (GLOVE) ×1 IMPLANT
GLOVE BIOGEL PI INDICATOR 8 (GLOVE) ×1
GLOVE ECLIPSE 8.0 STRL XLNG CF (GLOVE) ×4 IMPLANT
GOWN STRL REUS W/TWL XL LVL3 (GOWN DISPOSABLE) ×4 IMPLANT
KIT BASIN OR (CUSTOM PROCEDURE TRAY) ×2 IMPLANT
KIT POSITIONING SURG ANDREWS (MISCELLANEOUS) ×2 IMPLANT
MANIFOLD NEPTUNE II (INSTRUMENTS) ×2 IMPLANT
NEEDLE HYPO 22GX1.5 SAFETY (NEEDLE) ×2 IMPLANT
NEEDLE SPNL 18GX3.5 QUINCKE PK (NEEDLE) ×4 IMPLANT
NS IRRIG 1000ML POUR BTL (IV SOLUTION) IMPLANT
PACK LAMINECTOMY ORTHO (CUSTOM PROCEDURE TRAY) ×2 IMPLANT
PAD ABD 8X10 STRL (GAUZE/BANDAGES/DRESSINGS) ×2 IMPLANT
PATTIES SURGICAL .5 X.5 (GAUZE/BANDAGES/DRESSINGS) IMPLANT
PATTIES SURGICAL .75X.75 (GAUZE/BANDAGES/DRESSINGS) IMPLANT
PATTIES SURGICAL 1X1 (DISPOSABLE) IMPLANT
PIN SAFETY NICK PLATE  2 MED (MISCELLANEOUS)
PIN SAFETY NICK PLATE 2 MED (MISCELLANEOUS) IMPLANT
POSITIONER SURGICAL ARM (MISCELLANEOUS) ×2 IMPLANT
SPONGE LAP 4X18 X RAY DECT (DISPOSABLE) ×6 IMPLANT
SPONGE SURGIFOAM ABS GEL 100 (HEMOSTASIS) ×2 IMPLANT
STAPLER VISISTAT 35W (STAPLE) ×2 IMPLANT
SUT VIC AB 0 CT1 27 (SUTURE) ×1
SUT VIC AB 0 CT1 27XBRD ANTBC (SUTURE) ×1 IMPLANT
SUT VIC AB 1 CT1 27 (SUTURE) ×2
SUT VIC AB 1 CT1 27XBRD ANTBC (SUTURE) ×2 IMPLANT
SYR 20CC LL (SYRINGE) ×2 IMPLANT
TAPE CLOTH SURG 4X10 WHT LF (GAUZE/BANDAGES/DRESSINGS) ×2 IMPLANT
TOWEL OR 17X26 10 PK STRL BLUE (TOWEL DISPOSABLE) ×2 IMPLANT
TOWEL OR NON WOVEN STRL DISP B (DISPOSABLE) IMPLANT
TRAY FOLEY W/METER SILVER 14FR (SET/KITS/TRAYS/PACK) ×2 IMPLANT

## 2015-04-30 NOTE — Interval H&P Note (Signed)
History and Physical Interval Note:  04/30/2015 8:10 AM  Valerie Curry  has presented today for surgery, with the diagnosis of SPINAL STENOSIS  The various methods of treatment have been discussed with the patient and family. After consideration of risks, benefits and other options for treatment, the patient has consented to  Procedure(s): [place] FORAMINOTOMIES L3-L4,L4-L5 (N/A) as a surgical intervention .  The patient's history has been reviewed, patient examined, no change in status, stable for surgery.  I have reviewed the patient's chart and labs.  Questions were answered to the patient's satisfaction.     Jed Kutch A

## 2015-04-30 NOTE — Evaluation (Signed)
Physical Therapy Evaluation Patient Details Name: Valerie Curry MRN: [id number] DOB: 23-Apr-1946 Today's Date: 04/30/2015   History of Present Illness  L3-4, 4-5 lumbar decompression  Clinical Impression  Pt s/p back surgery presents with functional mobility limitations 2* post op pain and back precautions.  Pt should progress to dc home with family assist.    Follow Up Recommendations No PT follow up    Equipment Recommendations  Rolling walker with 5" wheels    Recommendations for Other Services OT consult     Precautions / Restrictions Precautions Precautions: Back Restrictions Weight Bearing Restrictions: No      Mobility  Bed Mobility Overal bed mobility: Needs Assistance Bed Mobility: Rolling Rolling: Min guard         General bed mobility comments: cues for sequence and correct log roll technique.  Assist with pillow placement between knees   Transfers                 General transfer comment: NT beyond bed mobility at pt request  Ambulation/Gait                Stairs            Wheelchair Mobility    Modified Rankin (Stroke Patients Only)       Balance                                             Pertinent Vitals/Pain Pain Assessment: 0-10 Pain Score: 6  Pain Location: Headache pain; pt reports minimal back pain  Pain Descriptors / Indicators: Aching;Throbbing Pain Intervention(s): Limited activity within patient's tolerance;Monitored during session;Patient requesting pain meds-RN notified    Home Living Family/patient expects to be discharged to:: Private residence Living Arrangements: Spouse/significant other Available Help at Discharge: Family Type of Home: House Home Access: Stairs to enter Entrance Stairs-Rails: None Technical brewer of Steps: 2 Home Layout: One level Home Equipment: [place] - single point      Prior Function Level of Independence: Independent               Hand  Dominance   Dominant Hand: Right    Extremity/Trunk Assessment   Upper Extremity Assessment: Overall WFL for tasks assessed           Lower Extremity Assessment: Overall WFL for tasks assessed         Communication   Communication: No difficulties  Cognition Arousal/Alertness: Awake/alert Behavior During Therapy: WFL for tasks assessed/performed Overall Cognitive Status: Within Functional Limits for tasks assessed                      General Comments      Exercises        Assessment/Plan    PT Assessment Patient needs continued PT services  PT Diagnosis Difficulty walking   PT Problem List Decreased strength;Decreased range of motion;Decreased activity tolerance;Decreased mobility;Decreased knowledge of use of DME;Pain;Decreased knowledge of precautions  PT Treatment Interventions DME instruction;Gait training;Stair training;Functional mobility training;Therapeutic activities;Patient/family education   PT Goals (Current goals can be found in the Care Plan section) Acute Rehab PT Goals Patient Stated Goal: Resume previous lifestyle with decreased pain PT Goal Formulation: With patient Time For Goal Achievement: 05/03/15 Potential to Achieve Goals: Good    Frequency Min 6X/week   Barriers to discharge        Co-evaluation  End of Session   Activity Tolerance: No increased pain;Patient limited by fatigue Patient left: in bed;with call bell/phone within reach;with family/visitor present Nurse Communication: Mobility status    Functional Assessment Tool Used: clinical judgement Functional Limitation: Mobility: Walking and moving around Mobility: Walking and Moving Around Current Status (N1833): At least 20 percent but less than 40 percent impaired, limited or restricted Mobility: Walking and Moving Around Goal Status [phone]): At least 1 percent but less than 20 percent impaired, limited or restricted    Time: 1638-1700 PT Time  Calculation (min) (ACUTE ONLY): 22 min   Charges:   PT Evaluation $Initial PT Evaluation Tier I: 1 Procedure     PT G Codes:   PT G-Codes **NOT FOR INPATIENT CLASS** Functional Assessment Tool Used: clinical judgement Functional Limitation: Mobility: Walking and moving around Mobility: Walking and Moving Around Current Status (G9842): At least 20 percent but less than 40 percent impaired, limited or restricted Mobility: Walking and Moving Around Goal Status [phone]): At least 1 percent but less than 20 percent impaired, limited or restricted    Shanteria Laye 04/30/2015, 5:22 PM

## 2015-04-30 NOTE — H&P (Signed)
Valerie Curry is an 69 y.o. female.   Chief Complaint: Back and Bilateral Leg Pain,Right greater than the left. HPI: Progressive back and Bilateral leg pain. Her Myelogram shows severe blocks at L-3-L-4 and L-4-L-5.  Past Medical History  Diagnosis Date  . Osteopenia     took Boniva 1 1/2 years in past-doesn't want to restart it  . Positive PPD     as a child. xray was normal.  . Personal history of tobacco use, presenting hazards to health   . Systemic sclerosis   . Lump or mass in breast 2011/2012    benign  . Obesity, unspecified   . Family history of malignant neoplasm of breast     Maternal/paternal aunts and cousin  . Hypertension   . Arthritis     Past Surgical History  Procedure Laterality Date  . Tonsillectomy  1968  . Colonoscopy  8/05    Dr. Vira Agar  . Bunionectomy  2010  . Appendectomy  1961  . Tubal ligation  1974  . Breast biopsy Right Nov 2011    benign  . Breast biopsy Left Nov 2012    dilated ducts-benign    Family History  Problem Relation Age of Onset  . Lung cancer Father   . Heart disease Father 12    Cardiac Arrest  . Heart disease Mother   . Hypertension Mother   . Stroke Mother   . Breast cancer      Aunts  . Other      hypercoag-family history  . Other Brother     blood clots  . Cancer Maternal Aunt     Breast cancer   Social History:  reports that she has been smoking Cigarettes.  She has a 50 pack-year smoking history. She has never used smokeless tobacco. She reports that she drinks alcohol. She reports that she does not use illicit drugs.  Allergies: No Known Allergies  Medications Prior to Admission  Medication Sig Dispense Refill  . Ascorbic Acid (VITAMIN C) 1000 MG tablet Take 1,000 mg by mouth every morning.     Marland Kitchen aspirin 81 MG tablet Take 81 mg by mouth every morning.     . Calcium Carbonate-Vit D-Min 600-400 MG-UNIT TABS Take 1 tablet by mouth 2 (two) times daily.      . hydrochlorothiazide (HYDRODIURIL) 25 MG tablet  Take 0.5 tablets (12.5 mg total) by mouth daily. 30 tablet 11  . naproxen (NAPROSYN) 250 MG tablet Take 500 mg by mouth 2 (two) times daily as needed for mild pain.    Marland Kitchen oxyCODONE-acetaminophen (PERCOCET/ROXICET) 5-325 MG per tablet Take 1 tablet by mouth every 4 (four) hours as needed for moderate pain.   0  . traMADol (ULTRAM) 50 MG tablet Take 1 tablet (50 mg total) by mouth 2 (two) times daily. (Patient taking differently: Take 50 mg by mouth 2 (two) times daily as needed for moderate pain. ) 60 tablet 0    No results found for this or any previous visit (from the past 48 hour(s)). No results found.  Review of Systems  Constitutional: Negative.   HENT: Negative.   Eyes: Negative.   Respiratory: Negative.   Cardiovascular: Negative.   Gastrointestinal: Negative.   Genitourinary: Negative.   Musculoskeletal: Positive for back pain.       Biaterall Leg Pain but more on the Right.  Skin: Negative.   Neurological: Positive for focal weakness.  Endo/Heme/Allergies: Negative.   Psychiatric/Behavioral: Negative.     Blood pressure 142/76,  pulse 70, temperature 97.6 F (36.4 C), temperature source Oral, resp. rate 18, height 5\' 3"  (1.6 m), weight 90.719 kg (200 lb), SpO2 97 %. Physical Exam  Constitutional: She appears well-developed.  HENT:  Head: Normocephalic.  Eyes: Pupils are equal, round, and reactive to light.  Neck: Normal range of motion.  Cardiovascular: Normal rate.   Respiratory: Effort normal.  GI: Soft.  Musculoskeletal:  Weakness of her Right Foot Dorsiflexors.     Assessment/Plan Decompressive Lumbar Laminectomies at L-3-L-4 and L-4-L-5.  Valerie Curry A 04/30/2015, 8:00 AM

## 2015-04-30 NOTE — Discharge Instructions (Addendum)
For the first few days, remove your dressing, tape a piece of saran wrap over your incision.  Take your shower, then remove the saran wrap and put a clean dressing on. After three days you can shower without the saran wrap.  Starting Friday, take aspirin 325mg  twice daily for one week Call Dr. Gladstone Lighter if any wound complications or temperature of 101 degrees F or over.  Call the office for an appointment to see Dr. Gladstone Lighter in two weeks: [phone] and ask for Dr. Charlestine Night nurse, Brunilda Payor.

## 2015-04-30 NOTE — Transfer of Care (Signed)
Immediate Anesthesia Transfer of Care Note  Patient: Valerie Curry  Procedure(s) Performed: Procedure(s): CENTRAL DECOMPRESSIVE LUMBER LAMINECTOMY WITH POSSIBLE FORAMINOTOMIES L3-L4,L4-L5 (N/A)  Patient Location: PACU  Anesthesia Type:General  Level of Consciousness: awake, alert  and oriented  Airway & Oxygen Therapy: Patient Spontanous Breathing and Patient connected to face mask oxygen  Post-op Assessment: Report given to RN and Post -op Vital signs reviewed and stable  Post vital signs: Reviewed and stable  Last Vitals:  Filed Vitals:   04/30/15 0621  BP: 142/76  Pulse: 70  Temp: 36.4 C  Resp: 18    Complications: No apparent anesthesia complications

## 2015-04-30 NOTE — Interval H&P Note (Signed)
History and Physical Interval Note:  04/30/2015 8:05 AM  Valerie Curry  has presented today for surgery, with the diagnosis of SPINAL STENOSIS  The various methods of treatment have been discussed with the patient and family. After consideration of risks, benefits and other options for treatment, the patient has consented to  Procedure(s): [place] FORAMINOTOMIES L3-L4,L4-L5 (N/A) as a surgical intervention .  The patient's history has been reviewed, patient examined, no change in status, stable for surgery.  I have reviewed the patient's chart and labs.  Questions were answered to the patient's satisfaction.     Kea Callan A

## 2015-04-30 NOTE — Brief Op Note (Signed)
04/30/2015  10:43 AM  PATIENT:  Valerie Curry  69 y.o. female  PRE-OPERATIVE DIAGNOSIS:  SPINAL STENOSIS at L-3-L-4 and L-4-L-5  POST-OPERATIVE DIAGNOSIS:  Same as Pre-Op  PROCEDURE:  Complete Decompressive Lumbar Laminectomies at L-3-L-4 and L-4-L-5 with Bilateral Foraminotomies for the L-4 and L-5 Nerve Roots for Foraminal Stenosis. SURGEON:  Surgeon(s) and Role:    * Latanya Maudlin, MD - Primary  :   ASSISTANTS: Tarri Glenn MD  ANESTHESIA:   general  EBL:  Total I/O In: 1000 [I.V.:1000] Out: 450 [Urine:300; Blood:150]  BLOOD ADMINISTERED:none  DRAINS: none   LOCAL MEDICATIONS USED:  MARCAINE 20cc of 0.50%  With Epinephrine at start of case and 20cc of Exparel at the end of the case.   SPECIMEN:  No Specimen  DISPOSITION OF SPECIMEN:  N/A  COUNTS:  YES  TOURNIQUET:  * No tourniquets in log *  DICTATION: .Other Dictation: Dictation Number [id number]  PLAN OF CARE: Admit for overnight observation  PATIENT DISPOSITION:  PACU - hemodynamically stable.   Delay start of Pharmacological VTE agent (>24hrs) due to surgical blood loss or risk of bleeding: yes

## 2015-04-30 NOTE — Anesthesia Procedure Notes (Signed)
Procedure Name: Intubation Performed by: Chandra Batch A Pre-anesthesia Checklist: Patient identified, Emergency Drugs available, Suction available, Patient being monitored and Timeout performed Patient Re-evaluated:Patient Re-evaluated prior to inductionOxygen Delivery Method: Circle system utilized Preoxygenation: Pre-oxygenation with 100% oxygen Intubation Type: IV induction Ventilation: Mask ventilation without difficulty Laryngoscope Size: Mac and 3 Grade View: Grade III Tube type: Oral Tube size: 7.5 mm Number of attempts: 2 Airway Equipment and Method: Bougie stylet (Grade 3 view with bougie assisted intubation) Placement Confirmation: positive ETCO2 and breath sounds checked- equal and bilateral Secured at: 21 cm Tube secured with: Tape Dental Injury: Teeth and Oropharynx as per pre-operative assessment  Difficulty Due To: Difficult Airway- due to anterior larynx

## 2015-04-30 NOTE — Anesthesia Postprocedure Evaluation (Signed)
  Anesthesia Post-op Note  Patient: Valerie Curry  Procedure(s) Performed: Procedure(s) (LRB): CENTRAL DECOMPRESSIVE LUMBER LAMINECTOMY WITH POSSIBLE FORAMINOTOMIES L3-L4,L4-L5 (N/A)  Patient Location: PACU  Anesthesia Type: General  Level of Consciousness: awake and alert   Airway and Oxygen Therapy: Patient Spontanous Breathing  Post-op Pain: mild  Post-op Assessment: Post-op Vital signs reviewed, Patient's Cardiovascular Status Stable, Respiratory Function Stable, Patent Airway and No signs of Nausea or vomiting  Last Vitals:  Filed Vitals:   04/30/15 1310  BP: 101/48  Pulse: 54  Temp: 36.5 C  Resp: 14    Post-op Vital Signs: stable   Complications: No apparent anesthesia complications

## 2015-05-01 ENCOUNTER — Encounter (HOSPITAL_COMMUNITY): Payer: Self-pay | Admitting: Orthopedic Surgery

## 2015-05-01 DIAGNOSIS — M4806 Spinal stenosis, lumbar region: Secondary | ICD-10-CM | POA: Diagnosis not present

## 2015-05-01 NOTE — Op Note (Signed)
NAMECHARMELLE, SOH               ACCOUNT NO.:  [id number]  MEDICAL RECORD NO.:  [id number]  LOCATION:  7169                         FACILITY:  [facility]  PHYSICIAN:  Kipp Brood. Marjoria Mancillas, M.D.DATE OF BIRTH:  30-Apr-1946  DATE OF PROCEDURE:  04/30/2015 DATE OF DISCHARGE:                              OPERATIVE REPORT   SURGEON:  Kipp Brood. Gladstone Lighter, MD  ASSISTANT:  Tarri Glenn, MD  PREOPERATIVE DIAGNOSES: 1. Severe spinal stenosis at L3-L4. 2. Moderate spinal stenosis at L4-5 with foraminal stenosis     bilaterally. 3. Partial foot drop on the right, all her symptoms are mainly on the     right, but she had some pain in the left leg as well.  DESCRIPTION OF PROCEDURE:  Under general anesthesia, the patient on spinal frame, a routine orthopedic prep and draping of the lower back was carried out.  The appropriate time-out was first carried out.  I also marked the right side of her back in the holding area, even though we were going central.  I marked the right side because that is where her partial foot drop was.  At this time, after the time-out, 2 needles were placed in the back for localization purposes.  X-ray was taken. Following that, incision was made over the L3-4, L4-5 space.  Bleeders identified and cauterized.  At this particular point, the self-retaining retractors were inserted.  The muscle was stripped from the lamina and spinous process bilaterally at both levels.  Another x-ray was taken with Kocher clamps in place.  Following that, I then inserted my McCullough retractors.  The bleeding was under good control.  We then removed the spinous process of L3, and then went down the partial of L4 and brought in the microscope.  We then did a central decompressive lumbar laminectomy at L3-4 and advanced proximally and also went down distally as well.  Great care was taken to protect the dura.  We left the ligamentum flavum in place during the bone work that we were  doing. Following that, we then identified the dura.  We loosely applied some plain Gelfoam, some plain cottonoid.  We then went out far laterally and decompressed the lateral recesses bilaterally and did foraminotomies in the usual fashion bilaterally for both L4 and L5 roots.  Once we removed the main part of the ligamentum flavum, we then took another x-ray. Following that, we then utilized a hockey stick.  We kept dissecting distally until we were totally free.  We went proximally with a hockey stick, then we were free, we had good clearance.  We then went out into the foramina and after we made sure that we had good foraminotomy for, we finished the procedure.  We thoroughly irrigated out the area.  There was no herniated disk.  This was all stenosis.  We loosely applied some thrombin-soaked Gelfoam and then closed the wound layers in the usual fashion.  Note, prior to the procedure, I injected 20 mL of 0.5% Marcaine and epinephrine into the soft tissue to help control bleeding. At the end of the procedure, I injected 20 mL of Exparel.  Note, she had 2 g of IV Ancef preop.  ______________________________ Kipp Brood Gladstone Lighter, M.D.     RAG/MEDQ  D:  04/30/2015  T:  05/01/2015  Job:  [id number]

## 2015-05-01 NOTE — Evaluation (Signed)
Occupational Therapy Evaluation Patient Details Name: Valerie Curry MRN: [id number] DOB: [birth year] Today's Date: 05/01/2015    History of Present Illness L3-4, 4-5 lumbar decompression   Clinical Impression   This 69 year old female was admitted for the above surgery.  All education was completed.  No further OT is needed at this time.    Follow Up Recommendations  No OT follow up    Equipment Recommendations   (will need RW)    Recommendations for Other Services       Precautions / Restrictions Precautions Precautions: Back Restrictions Weight Bearing Restrictions: No      Mobility Bed Mobility     Rolling: Min guard Sidelying to sit: Min assist     Sit to sidelying: Mod assist General bed mobility comments: cues for technique.  Assist for trunk sitting up and legs for back to bed  Transfers Overall transfer level: Needs assistance Equipment used: Rolling walker (2 wheeled) Transfers: Sit to/from Stand Sit to Stand: Min guard;Min assist         General transfer comment: min guard from high bed; min A from comfort height commode    Balance                                            ADL Overall ADL's : Needs assistance/impaired     Grooming: Oral care;Supervision/safety;Standing   Upper Body Bathing: Set up;Supervision/ safety;Sitting   Lower Body Bathing: Moderate assistance;Sit to/from stand   Upper Body Dressing : Minimal assistance;Sitting (iv)   Lower Body Dressing: Maximal assistance;Sit to/from stand   Toilet Transfer: Min guard;Ambulation;BSC;RW   Toileting- Clothing Manipulation and Hygiene: Moderate assistance;Sit to/from stand         General ADL Comments: performed ADL from EOB and reviewed back precautions.  Pt would benefit from toilet aide.  Husband will assist with adls.  Pt needs min A for transfer to high commode:  husband will assist her.  She does not feel she needs a 3:1(husband will assist with  transfer) but will need a RW.  initially took a few steps to bathroom without walker and needed min A.  Pt much steadier with RW.  Educated on sequence for stepping into shower     Vision     Perception     Praxis      Pertinent Vitals/Pain Pain Score: 2  Pain Location: back Pain Descriptors / Indicators: Sore Pain Intervention(s): Limited activity within patient's tolerance;Monitored during session;Repositioned     Hand Dominance     Extremity/Trunk Assessment Upper Extremity Assessment Upper Extremity Assessment: Overall WFL for tasks assessed           Communication Communication Communication: No difficulties   Cognition Arousal/Alertness: Awake/alert Behavior During Therapy: WFL for tasks assessed/performed Overall Cognitive Status: Within Functional Limits for tasks assessed                     General Comments       Exercises       Shoulder Instructions      Home Living Family/patient expects to be discharged to:: Private residence Living Arrangements: Spouse/significant other Available Help at Discharge: Family               Bathroom Shower/Tub: Walk-in Corporate treasurer Toilet: Handicapped height     Home Equipment: Kasandra Knudsen - single point  Prior Functioning/Environment Level of Independence: Independent             OT Diagnosis: Generalized weakness   OT Problem List:     OT Treatment/Interventions:      OT Goals(Current goals can be found in the care plan section) Acute Rehab OT Goals Patient Stated Goal: Resume previous lifestyle with decreased pain  OT Frequency:     Barriers to D/C:            Co-evaluation              End of Session    Activity Tolerance: Patient tolerated treatment well Patient left: in bed;with call bell/phone within reach;with family/visitor present   Time: 3419-3790 OT Time Calculation (min): 45 min Charges:  OT General Charges $OT Visit: 1 Procedure OT  Evaluation $Initial OT Evaluation Tier I: 1 Procedure OT Treatments $Self Care/Home Management : 23-37 mins G-Codes: OT G-codes **NOT FOR INPATIENT CLASS** Functional Assessment Tool Used: clinical judgment and observation Functional Limitation: Self care Self Care Current Status (W4097): At least 60 percent but less than 80 percent impaired, limited or restricted Self Care Goal Status (D5329): At least 60 percent but less than 80 percent impaired, limited or restricted Self Care Discharge Status [phone]): At least 60 percent but less than 80 percent impaired, limited or restricted  [facility] 05/01/2015, 8:52 AM Lesle Chris, OTR/L [phone] 05/01/2015

## 2015-05-01 NOTE — Progress Notes (Signed)
Physical Therapy Treatment Patient Details Name: Valerie Curry MRN: [id number] DOB: 07-09-1946 Today's Date: 05/01/2015    History of Present Illness L3-4, 4-5 lumbar decompression    PT Comments    Pt progressing well; feels much better today; husband supportive and  present for PT session, no further PT needs  Follow Up Recommendations  No PT follow up     Equipment Recommendations  Rolling walker with 5" wheels    Recommendations for Other Services       Precautions / Restrictions Precautions Precautions: Back Precaution Comments: reviewed, pt able to verbalize 2/3 Restrictions Weight Bearing Restrictions: No    Mobility  Bed Mobility Overal bed mobility: Needs Assistance Bed Mobility: Rolling;Sidelying to Sit Rolling: Supervision Sidelying to sit: Min guard     Sit to sidelying: Min assist General bed mobility comments: cues for techniquem husband instructed how to assist if needed at home  Transfers Overall transfer level: Needs assistance Equipment used: Rolling walker (2 wheeled) Transfers: Sit to/from Stand Sit to Stand: Supervision;Min guard         General transfer comment: cues for posture and hand placement  Ambulation/Gait Ambulation/Gait assistance: Supervision Ambulation Distance (Feet): 200 Feet Assistive device: Rolling walker (2 wheeled) Gait Pattern/deviations: Step-through pattern;Decreased stride length     General Gait Details: cues for RW position and to avoid twisting     Stairs Stairs: Yes Stairs assistance: Min guard Stair Management: One rail Right;Step to pattern Number of Stairs: 3 General stair comments: cues for sequence  Wheelchair Mobility    Modified Rankin (Stroke Patients Only)       Balance                                    Cognition Arousal/Alertness: Awake/alert Behavior During Therapy: WFL for tasks assessed/performed Overall Cognitive Status: Within Functional Limits for tasks  assessed                      Exercises      General Comments        Pertinent Vitals/Pain Pain Assessment: 0-10 Pain Score: 4  Pain Location: back Pain Descriptors / Indicators: Sore Pain Intervention(s): Monitored during session;Repositioned;RN gave pain meds during session    Home Living                      Prior Function            PT Goals (current goals can now be found in the care plan section) Acute Rehab PT Goals Patient Stated Goal: Resume previous lifestyle with decreased pain PT Goal Formulation: With patient Time For Goal Achievement: 05/03/15 Potential to Achieve Goals: Good Progress towards PT goals: Progressing toward goals    Frequency  Min 6X/week    PT Plan Current plan remains appropriate    Co-evaluation             End of Session   Activity Tolerance: Patient tolerated treatment well;No increased pain Patient left: in bed;with call bell/phone within reach;with family/visitor present     Time: 0926-0946 PT Time Calculation (min) (ACUTE ONLY): 20 min  Charges:  $Gait Training: 8-22 mins                    G Codes:  Functional Assessment Tool Used: clinical judgement Mobility: Walking and Moving Around Goal Status [phone]): At least 1 percent but  less than 20 percent impaired, limited or restricted Mobility: Walking and Moving Around Discharge Status [phone]): At least 1 percent but less than 20 percent impaired, limited or restricted   [facility] 05/01/2015, 12:00 PM

## 2015-05-01 NOTE — Care Management Note (Signed)
Case Management Note  Patient Details  Name: Valerie Curry MRN: [id number] Date of Birth: Apr 04, 1946  Subjective/Objective:                   L3-4, 4-5 lumbar decompression Action/Plan:  dishcarge planning Expected Discharge Date:  05/01/15               Expected Discharge Plan:  Home/Self Care  In-House Referral:     Discharge planning Services  CM Consult  Post Acute Care Choice:    Choice offered to:     DME Arranged:  Walker rolling DME Agency:  [place]:    [place]:     Status of Service:  Completed, signed off  Medicare Important Message Given:    Date Medicare IM Given:    Medicare IM give by:    Date Additional Medicare IM Given:    Additional Medicare Important Message give by:     If discussed at [place] of Stay Meetings, dates discussed:    Additional Comments: PT recc rolling walker.  NO HH needs.  CM requested order and called [facility] DME rep, Lecretia to please deliver to room so pt can discharge.  No other CM needs. Dellie Catholic, RN 05/01/2015, 10:36 AM

## 2015-05-01 NOTE — Progress Notes (Signed)
Subjective: 1 Day Post-Op Procedure(s) (LRB): CENTRAL DECOMPRESSIVE LUMBER LAMINECTOMY WITH POSSIBLE FORAMINOTOMIES L3-L4,L4-L5 (N/A) Patient reports pain as 2 on 0-10 scale.    Objective: Vital signs in last 24 hours: Temp:  [97.3 F (36.3 C)-99.4 F (37.4 C)] 98.9 F (37.2 C) (06/02 0550) Pulse Rate:  [54-77] 64 (06/02 0550) Resp:  [9-16] 16 (06/02 0550) BP: (93-116)/(48-72) 105/55 mmHg (06/02 0550) SpO2:  [99 %-100 %] 100 % (06/02 0550)  Intake/Output from previous day: 06/01 0701 - 06/02 0700 In: 4525 [P.O.:1820; I.V.:2650; IV Piggyback:55] Out: 4259 [Urine:3575; Blood:150] Intake/Output this shift:    No results for input(s): HGB in the last 72 hours. No results for input(s): WBC, RBC, HCT, PLT in the last 72 hours. No results for input(s): NA, K, CL, CO2, BUN, CREATININE, GLUCOSE, CALCIUM in the last 72 hours. No results for input(s): LABPT, INR in the last 72 hours.  Dorsiflexion/Plantar flexion intact  Assessment/Plan: 1 Day Post-Op Procedure(s) (LRB): CENTRAL DECOMPRESSIVE LUMBER LAMINECTOMY WITH POSSIBLE FORAMINOTOMIES L3-L4,L4-L5 (N/A) Discharge home with home health  Valerie Curry A 05/01/2015, 7:15 AM

## 2015-05-05 NOTE — Discharge Summary (Signed)
Physician Discharge Summary   Patient ID: RAYYA YAGI MRN: [id number] DOB/AGE: [birth year] 69 y.o.  Admit date: 04/30/2015 Discharge date: 05/01/2015  Primary Diagnosis: Lumbar spinal stenosis  Admission Diagnoses:  Past Medical History  Diagnosis Date  . Osteopenia     took Boniva 1 1/2 years in past-doesn't want to restart it  . Positive PPD     as a child. xray was normal.  . Personal history of tobacco use, presenting hazards to health   . Systemic sclerosis   . Lump or mass in breast 2011/2012    benign  . Obesity, unspecified   . Family history of malignant neoplasm of breast     Maternal/paternal aunts and cousin  . Hypertension   . Arthritis    Discharge Diagnoses:   Active Problems:   Spinal stenosis, lumbar region, with neurogenic claudication  Estimated body mass index is 35.44 kg/(m^2) as calculated from the following:   Height as of this encounter: _0  (1.6 m).   Weight as of this encounter: 90.719 kg (200 lb).  Procedure:  Procedure(s) (LRB): CENTRAL DECOMPRESSIVE LUMBER LAMINECTOMY WITH POSSIBLE FORAMINOTOMIES L3-L4,L4-L5 (N/A)   Consults: None  HPI: Progressive back and Bilateral leg pain. Her Myelogram shows severe blocks at L-3-L-4 and L-4-L-5.  Laboratory Data: Hospital Outpatient Visit on 04/22/2015  Component Date Value Ref Range Status  . WBC 04/22/2015 5.6  4.0 - 10.5 K/uL Final  . RBC 04/22/2015 4.59  3.87 - 5.11 MIL/uL Final  . Hemoglobin 04/22/2015 14.2  12.0 - 15.0 g/dL Final  . HCT 04/22/2015 42.5  36.0 - 46.0 % Final  . MCV 04/22/2015 92.6  78.0 - 100.0 fL Final  . MCH 04/22/2015 30.9  26.0 - 34.0 pg Final  . MCHC 04/22/2015 33.4  30.0 - 36.0 g/dL Final  . RDW 04/22/2015 13.1  11.5 - 15.5 % Final  . Platelets 04/22/2015 251  150 - 400 K/uL Final  . Neutrophils Relative % 04/22/2015 69  43 - 77 % Final  . Neutro Abs 04/22/2015 3.8  1.7 - 7.7 K/uL Final  . Lymphocytes Relative 04/22/2015 23  12 - 46 % Final  . Lymphs Abs  04/22/2015 1.3  0.7 - 4.0 K/uL Final  . Monocytes Relative 04/22/2015 7  3 - 12 % Final  . Monocytes Absolute 04/22/2015 0.4  0.1 - 1.0 K/uL Final  . Eosinophils Relative 04/22/2015 1  0 - 5 % Final  . Eosinophils Absolute 04/22/2015 0.1  0.0 - 0.7 K/uL Final  . Basophils Relative 04/22/2015 0  0 - 1 % Final  . Basophils Absolute 04/22/2015 0.0  0.0 - 0.1 K/uL Final  . Sodium 04/22/2015 142  135 - 145 mmol/L Final  . Potassium 04/22/2015 4.5  3.5 - 5.1 mmol/L Final  . Chloride 04/22/2015 106  101 - 111 mmol/L Final  . CO2 04/22/2015 29  22 - 32 mmol/L Final  . Glucose, Bld 04/22/2015 94  65 - 99 mg/dL Final  . BUN 04/22/2015 23* 6 - 20 mg/dL Final  . Creatinine, Ser 04/22/2015 0.59  0.44 - 1.00 mg/dL Final  . Calcium 04/22/2015 9.1  8.9 - 10.3 mg/dL Final  . Total Protein 04/22/2015 6.9  6.5 - 8.1 g/dL Final  . Albumin 04/22/2015 3.9  3.5 - 5.0 g/dL Final  . AST 04/22/2015 22  15 - 41 U/L Final  . ALT 04/22/2015 16  14 - 54 U/L Final  . Alkaline Phosphatase 04/22/2015 63  38 - 126 U/L Final  .  Total Bilirubin 04/22/2015 0.3  0.3 - 1.2 mg/dL Final  . GFR calc non Af Amer 04/22/2015 >60  >60 mL/min Final  . GFR calc Af Amer 04/22/2015 >60  >60 mL/min Final   Comment: (NOTE) The eGFR has been calculated using the CKD EPI equation. This calculation has not been validated in all clinical situations. eGFR's persistently <60 mL/min signify possible Chronic Kidney Disease.   . Anion gap 04/22/2015 7  5 - 15 Final  . Prothrombin Time 04/22/2015 12.8  11.6 - 15.2 seconds Final  . INR 04/22/2015 0.94  0.00 - 1.49 Final  . ABO/RH(D) 04/22/2015 A POS   Final  . Antibody Screen 04/22/2015 NEG   Final  . Sample Expiration 04/22/2015 05/03/2015   Final  . Color, Urine 04/22/2015 YELLOW  YELLOW Final  . APPearance 04/22/2015 CLEAR  CLEAR Final  . Specific Gravity, Urine 04/22/2015 1.005  1.005 - 1.030 Final  . pH 04/22/2015 6.5  5.0 - 8.0 Final  . Glucose, UA 04/22/2015 NEGATIVE  NEGATIVE mg/dL  Final  . Hgb urine dipstick 04/22/2015 TRACE* NEGATIVE Final  . Bilirubin Urine 04/22/2015 NEGATIVE  NEGATIVE Final  . Ketones, ur 04/22/2015 NEGATIVE  NEGATIVE mg/dL Final  . Protein, ur 04/22/2015 NEGATIVE  NEGATIVE mg/dL Final  . Urobilinogen, UA 04/22/2015 0.2  0.0 - 1.0 mg/dL Final  . Nitrite 04/22/2015 NEGATIVE  NEGATIVE Final  . Leukocytes, UA 04/22/2015 NEGATIVE  NEGATIVE Final  . MRSA, PCR 04/22/2015 NEGATIVE  NEGATIVE Final  . Staphylococcus aureus 04/22/2015 NEGATIVE  NEGATIVE Final   Comment:        The Xpert SA Assay (FDA approved for NASAL specimens in patients over 58 years of age), is one component of a comprehensive surveillance program.  Test performance has been validated by [facility] for patients greater than or equal to 74 year old. It is not intended to diagnose infection nor to guide or monitor treatment.   . ABO/RH(D) 04/22/2015 A POS   Final  . Squamous Epithelial / LPF 04/22/2015 FEW* RARE Final  . WBC, UA 04/22/2015 0-2  <3 WBC/hpf Final  . RBC / HPF 04/22/2015 0-2  <3 RBC/hpf Final  . Bacteria, UA 04/22/2015 MANY* RARE Final  Office Visit on 03/25/2015  Component Date Value Ref Range Status  . Sodium 03/25/2015 138  135 - 145 mEq/L Final  . Potassium 03/25/2015 3.8  3.5 - 5.1 mEq/L Final  . Chloride 03/25/2015 103  96 - 112 mEq/L Final  . CO2 03/25/2015 32  19 - 32 mEq/L Final  . Glucose, Bld 03/25/2015 85  70 - 99 mg/dL Final  . BUN 03/25/2015 22  6 - 23 mg/dL Final  . Creatinine, Ser 03/25/2015 1.29* 0.40 - 1.20 mg/dL Final  . Total Bilirubin 03/25/2015 0.3  0.2 - 1.2 mg/dL Final  . Alkaline Phosphatase 03/25/2015 62  39 - 117 U/L Final  . AST 03/25/2015 20  0 - 37 U/L Final  . ALT 03/25/2015 14  0 - 35 U/L Final  . Total Protein 03/25/2015 6.2  6.0 - 8.3 g/dL Final  . Albumin 03/25/2015 4.1  3.5 - 5.2 g/dL Final  . Calcium 03/25/2015 9.4  8.4 - 10.5 mg/dL Final  . GFR 03/25/2015 43.64* >60.00 mL/min Final  . WBC 03/25/2015 6.1  4.0 - 10.5  K/uL Final  . RBC 03/25/2015 4.39  3.87 - 5.11 Mil/uL Final  . Platelets 03/25/2015 230.0  150.0 - 400.0 K/uL Final  . Hemoglobin 03/25/2015 13.7  12.0 - 15.0  g/dL Final  . HCT 03/25/2015 39.8  36.0 - 46.0 % Final  . MCV 03/25/2015 90.7  78.0 - 100.0 fl Final  . MCHC 03/25/2015 34.4  30.0 - 36.0 g/dL Final  . RDW 03/25/2015 13.4  11.5 - 15.5 % Final     X-Rays:Dg Chest 2 View  04/22/2015   CLINICAL DATA:  Preoperative lumbar surgery.  Hypertension  EXAM: CHEST  2 VIEW  COMPARISON:  None.  FINDINGS: There is no edema or consolidation. Heart size and pulmonary vascularity are normal. No adenopathy. There is mild degenerative change in the thoracic spine.  IMPRESSION: No edema or consolidation.   Electronically Signed   By: Lowella Grip III M.D.   On: 04/22/2015 10:01   Dg Lumbar Spine 2-3 Views  04/29/2015   ADDENDUM REPORT: 04/29/2015 13:43  ADDENDUM: Lumbar spinal levels have now been labeled as requested.   Electronically Signed   By: David  Martinique M.D.   On: 04/29/2015 13:43   04/29/2015   CLINICAL DATA:  Preoperative examination prior to back surgery, chronic low back pain  EXAM: LUMBAR SPINE - 2-3 VIEW  COMPARISON:  CT scan lumbar spine of February 17, 2015  FINDINGS: The lumbar vertebral bodies are preserved in height. There is stable grade 1 anterolisthesis at L3-4 and at L4-5. There is disc space narrowing at L4-5. There is facet joint hypertrophy from L3 through S1. The pedicles and transverse processes are intact. There is partial sacralization of L1 on the left.  IMPRESSION: Chronic changes centered at L4-5 and at L3-4 as described. There is no acute compression fracture.  Electronically Signed: By: David  Martinique M.D. On: 04/22/2015 09:58   Dg Spine Portable 1 View  04/30/2015   CLINICAL DATA:  L3-4 and L4-5 decompression  EXAM: PORTABLE SPINE - 1 VIEW  COMPARISON:  Study obtained earlier in the day  FINDINGS: Cross-table lateral lumbar spine image obtained. There are two probes present.  The probe which is more superior from a posterior approach slants downward with the tip posterior to the superior aspect of the L4 vertebral body. The more inferior probe posteriorly is slanted superiorly. Its tip is posterior to the superior most aspect of the L3 vertebral body. The spondylolisthesis at L3-4 and L4-5 is stable. No fracture.  IMPRESSION: Metallic probes as described. Stable spondylolisthesis at L3-4 and L4-5.   Electronically Signed   By: Lowella Grip III M.D.   On: 04/30/2015 10:13   Dg Spine Portable 1 View  04/30/2015   CLINICAL DATA:  69 year old female undergoing a planned L3-L4 and L4-L5 fusion  EXAM: PORTABLE SPINE - 1 VIEW  COMPARISON:  Intraoperative radiographs obtained earlier today at 8:53 a.m.  FINDINGS: Two metallic probes are present within the soft tissues posterior to the spine. The more superior probe overlies the L3 spinous process while the more inferior probe overlies the L4 spinous process. There is multilevel facet arthropathy. Mild grade 1 anterolisthesis of L3 on L4.  IMPRESSION: Intraoperative localization radiographs as above.   Electronically Signed   By: Jacqulynn Cadet M.D.   On: 04/30/2015 09:28   Dg Spine Portable 1 View  04/30/2015   CLINICAL DATA:  Lumbar decompression  EXAM: PORTABLE SPINE - 1 VIEW  COMPARISON:  Apr 22, 2015  FINDINGS: There is a metallic probe with the tip posterior to the inferior aspect of the L4 vertebral body. The more inferior probe tip is posterior to the superior aspect of the L5 vertebral body. There is stable grade I/IV  anterolisthesis of L3 on L4. There is grade I/IV anterolisthesis of L4 on L5, stable. No other spondylolisthesis. No fracture.  IMPRESSION: Metallic probes as described. Stable spondylolisthesis at L3-4 and L4-5. No fracture.   Electronically Signed   By: Lowella Grip III M.D.   On: 04/30/2015 09:09    EKG: Orders placed or performed during the hospital encounter of 04/22/15  . EKG  . EKG      Hospital Course: Valerie Curry is a 69 y.o. who was admitted to [facility]. They were brought to the operating room on 04/30/2015 - 05/01/2015 and underwent Procedure(s): CENTRAL DECOMPRESSIVE LUMBER LAMINECTOMY WITH POSSIBLE FORAMINOTOMIES L3-L4,L4-L5.  Patient tolerated the procedure well and was later transferred to the recovery room and then to the orthopaedic floor for postoperative care.  They were given PO and IV analgesics for pain control following their surgery.  They were given 24 hours of postoperative antibiotics of  Anti-infectives    Start     Dose/Rate Route Frequency Ordered Stop   04/30/15 1630  ceFAZolin (ANCEF) IVPB 1 g/50 mL premix     1 g 100 mL/hr over 30 Minutes Intravenous Every 8 hours 04/30/15 1238 05/01/15 0837   04/30/15 0917  polymyxin B 500,000 Units, bacitracin 50,000 Units in sodium chloride irrigation 0.9 % 500 mL irrigation  Status:  Discontinued       As needed 04/30/15 0917 04/30/15 1157   04/30/15 0636  ceFAZolin (ANCEF) IVPB 2 g/50 mL premix     2 g 100 mL/hr over 30 Minutes Intravenous On call to O.R. 04/30/15 1478 04/30/15 0836     and started on DVT prophylaxis in the form of Aspirin.   PT and OT were ordered.  Discharge planning consulted to help with postop disposition and equipment needs.  Patient had a good night on the evening of surgery.  They started to get up OOB with therapy on day one.  Patient was seen in rounds and was ready to go home.   Diet: Cardiac diet Activity:WBAT Follow-up:in 2 weeks Disposition - Home Discharged Condition: stable   Discharge Instructions    Call MD / Call 911    Complete by:  As directed   If you experience chest pain or shortness of breath, CALL 911 and be transported to the hospital emergency room.  If you develope a fever above 101 F, pus (white drainage) or increased drainage or redness at the wound, or calf pain, call your surgeon's office.     Constipation Prevention    Complete by:  As  directed   Drink plenty of fluids.  Prune juice may be helpful.  You may use a stool softener, such as Colace (over the counter) 100 mg twice a day.  Use MiraLax (over the counter) for constipation as needed.     Diet - low sodium heart healthy    Complete by:  As directed      Discharge instructions    Complete by:  As directed   For the first few days, remove your dressing, tape a piece of saran wrap over your incision.  Take your shower, then remove the saran wrap and put a clean dressing on. After three days you can shower without the saran wrap.  Call Dr. Gladstone Lighter if any wound complications or temperature of 101 degrees F or over.  Call the office for an appointment to see Dr. Gladstone Lighter in two weeks: [phone] and ask for Dr. Charlestine Night nurse, Brunilda Payor.  Driving restrictions    Complete by:  As directed   No driving while taking pain medications     Increase activity slowly as tolerated    Complete by:  As directed      Lifting restrictions    Complete by:  As directed   No lifting            Medication List    STOP taking these medications        traMADol 50 MG tablet  Commonly known as:  ULTRAM      TAKE these medications        aspirin 81 MG tablet  Take 81 mg by mouth every morning.     Calcium Carbonate-Vit D-Min 600-400 MG-UNIT Tabs  Take 1 tablet by mouth 2 (two) times daily.     hydrochlorothiazide 25 MG tablet  Commonly known as:  HYDRODIURIL  Take 0.5 tablets (12.5 mg total) by mouth daily.     methocarbamol 500 MG tablet  Commonly known as:  ROBAXIN  Take 1 tablet (500 mg total) by mouth every 6 (six) hours as needed for muscle spasms.     naproxen 250 MG tablet  Commonly known as:  NAPROSYN  Take 500 mg by mouth 2 (two) times daily as needed for mild pain.     oxyCODONE-acetaminophen 5-325 MG per tablet  Commonly known as:  PERCOCET/ROXICET  Take 1-2 tablets by mouth every 4 (four) hours as needed for moderate pain.     vitamin C 1000 MG  tablet  Take 1,000 mg by mouth every morning.           Follow-up Information    Follow up with GIOFFRE,RONALD A, MD. Schedule an appointment as soon as possible for a visit in 2 weeks.   Specialty:  Orthopedic Surgery   Contact information:   [address] [place] [address] [phone]       Follow up with [place].   Why:  rolling walker   Contact information:   [place] [address] [phone]       Signed: Ardeen Jourdain, PA-C Orthopaedic Surgery 05/05/2015, 8:43 AM

## 2015-09-18 ENCOUNTER — Encounter: Payer: PPO | Admitting: Internal Medicine

## 2015-09-22 ENCOUNTER — Encounter: Payer: PPO | Admitting: Internal Medicine

## 2015-10-06 ENCOUNTER — Ambulatory Visit (INDEPENDENT_AMBULATORY_CARE_PROVIDER_SITE_OTHER): Payer: PPO | Admitting: Internal Medicine

## 2015-10-06 ENCOUNTER — Encounter: Payer: Self-pay | Admitting: Internal Medicine

## 2015-10-06 VITALS — BP 125/67 | HR 68 | Temp 97.9°F | Ht 62.0 in | Wt 196.5 lb

## 2015-10-06 DIAGNOSIS — Z0000 Encounter for general adult medical examination without abnormal findings: Secondary | ICD-10-CM | POA: Diagnosis not present

## 2015-10-06 DIAGNOSIS — E669 Obesity, unspecified: Secondary | ICD-10-CM | POA: Diagnosis not present

## 2015-10-06 DIAGNOSIS — Z23 Encounter for immunization: Secondary | ICD-10-CM

## 2015-10-06 DIAGNOSIS — Z780 Asymptomatic menopausal state: Secondary | ICD-10-CM

## 2015-10-06 DIAGNOSIS — Z122 Encounter for screening for malignant neoplasm of respiratory organs: Secondary | ICD-10-CM | POA: Insufficient documentation

## 2015-10-06 DIAGNOSIS — Z1239 Encounter for other screening for malignant neoplasm of breast: Secondary | ICD-10-CM

## 2015-10-06 HISTORY — DX: Asymptomatic menopausal state: Z78.0

## 2015-10-06 NOTE — Progress Notes (Signed)
Pre visit review using our clinic review tool, if applicable. No additional management support is needed unless otherwise documented below in the visit note. 

## 2015-10-06 NOTE — Assessment & Plan Note (Signed)
General medical exam normal today including breast exam. PAP and pelvic deferred per preference and last PAP 2013 was normal, HPV neg. Bone density ordered. Mammogram ordered. Colonoscopy UTD and reviewed. Pneumovax given today. Other immunizations are UTD. Labs ordered through [facility]. Encouraged healthy diet and exercise.

## 2015-10-06 NOTE — Assessment & Plan Note (Signed)
Breast exam normal today. Mammogram scheduled.

## 2015-10-06 NOTE — Patient Instructions (Signed)
Health Maintenance, Female Adopting a healthy lifestyle and getting preventive care can go a long way to promote health and wellness. Talk with your health care provider about what schedule of regular examinations is right for you. This is a good chance for you to check in with your provider about disease prevention and staying healthy. In between checkups, there are plenty of things you can do on your own. Experts have done a lot of research about which lifestyle changes and preventive measures are most likely to keep you healthy. Ask your health care provider for more information. WEIGHT AND DIET  Eat a healthy diet  Be sure to include plenty of vegetables, fruits, low-fat dairy products, and lean protein.  Do not eat a lot of foods high in solid fats, added sugars, or salt.  Get regular exercise. This is one of the most important things you can do for your health.  Most adults should exercise for at least 150 minutes each week. The exercise should increase your heart rate and make you sweat (moderate-intensity exercise).  Most adults should also do strengthening exercises at least twice a week. This is in addition to the moderate-intensity exercise.  Maintain a healthy weight  Body mass index (BMI) is a measurement that can be used to identify possible weight problems. It estimates body fat based on height and weight. Your health care provider can help determine your BMI and help you achieve or maintain a healthy weight.  For females 20 years of age and older:   A BMI below 18.5 is considered underweight.  A BMI of 18.5 to 24.9 is normal.  A BMI of 25 to 29.9 is considered overweight.  A BMI of 30 and above is considered obese.  Watch levels of cholesterol and blood lipids  You should start having your blood tested for lipids and cholesterol at 69 years of age, then have this test every 5 years.  You may need to have your cholesterol levels checked more often if:  Your lipid  or cholesterol levels are high.  You are older than 69 years of age.  You are at high risk for heart disease.  CANCER SCREENING   Lung Cancer  Lung cancer screening is recommended for adults 55-80 years old who are at high risk for lung cancer because of a history of smoking.  A yearly low-dose CT scan of the lungs is recommended for people who:  Currently smoke.  Have quit within the past 15 years.  Have at least a 30-pack-year history of smoking. A pack year is smoking an average of one pack of cigarettes a day for 1 year.  Yearly screening should continue until it has been 15 years since you quit.  Yearly screening should stop if you develop a health problem that would prevent you from having lung cancer treatment.  Breast Cancer  Practice breast self-awareness. This means understanding how your breasts normally appear and feel.  It also means doing regular breast self-exams. Let your health care provider know about any changes, no matter how small.  If you are in your 20s or 30s, you should have a clinical breast exam (CBE) by a health care provider every 1-3 years as part of a regular health exam.  If you are 40 or older, have a CBE every year. Also consider having a breast X-ray (mammogram) every year.  If you have a family history of breast cancer, talk to your health care provider about genetic screening.  If you   are at high risk for breast cancer, talk to your health care provider about having an MRI and a mammogram every year.  Breast cancer gene (BRCA) assessment is recommended for women who have family members with BRCA-related cancers. BRCA-related cancers include:  Breast.  Ovarian.  Tubal.  Peritoneal cancers.  Results of the assessment will determine the need for genetic counseling and BRCA1 and BRCA2 testing. Cervical Cancer Your health care provider may recommend that you be screened regularly for cancer of the pelvic organs (ovaries, uterus, and  vagina). This screening involves a pelvic examination, including checking for microscopic changes to the surface of your cervix (Pap test). You may be encouraged to have this screening done every 3 years, beginning at age 21.  For women ages 30-65, health care providers may recommend pelvic exams and Pap testing every 3 years, or they may recommend the Pap and pelvic exam, combined with testing for human papilloma virus (HPV), every 5 years. Some types of HPV increase your risk of cervical cancer. Testing for HPV may also be done on women of any age with unclear Pap test results.  Other health care providers may not recommend any screening for nonpregnant women who are considered low risk for pelvic cancer and who do not have symptoms. Ask your health care provider if a screening pelvic exam is right for you.  If you have had past treatment for cervical cancer or a condition that could lead to cancer, you need Pap tests and screening for cancer for at least 20 years after your treatment. If Pap tests have been discontinued, your risk factors (such as having a new sexual partner) need to be reassessed to determine if screening should resume. Some women have medical problems that increase the chance of getting cervical cancer. In these cases, your health care provider may recommend more frequent screening and Pap tests. Colorectal Cancer  This type of cancer can be detected and often prevented.  Routine colorectal cancer screening usually begins at 69 years of age and continues through 69 years of age.  Your health care provider may recommend screening at an earlier age if you have risk factors for colon cancer.  Your health care provider may also recommend using home test kits to check for hidden blood in the stool.  A small camera at the end of a tube can be used to examine your colon directly (sigmoidoscopy or colonoscopy). This is done to check for the earliest forms of colorectal  cancer.  Routine screening usually begins at age 50.  Direct examination of the colon should be repeated every 5-10 years through 69 years of age. However, you may need to be screened more often if early forms of precancerous polyps or small growths are found. Skin Cancer  Check your skin from head to toe regularly.  Tell your health care provider about any new moles or changes in moles, especially if there is a change in a mole's shape or color.  Also tell your health care provider if you have a mole that is larger than the size of a pencil eraser.  Always use sunscreen. Apply sunscreen liberally and repeatedly throughout the day.  Protect yourself by wearing long sleeves, pants, a wide-brimmed hat, and sunglasses whenever you are outside. HEART DISEASE, DIABETES, AND HIGH BLOOD PRESSURE   High blood pressure causes heart disease and increases the risk of stroke. High blood pressure is more likely to develop in:  People who have blood pressure in the high end   of the normal range (130-139/85-89 mm Hg).  People who are overweight or obese.  People who are African American.  If you are 38-23 years of age, have your blood pressure checked every 3-5 years. If you are 61 years of age or older, have your blood pressure checked every year. You should have your blood pressure measured twice--once when you are at a hospital or clinic, and once when you are not at a hospital or clinic. Record the average of the two measurements. To check your blood pressure when you are not at a hospital or clinic, you can use:  An automated blood pressure machine at a pharmacy.  A home blood pressure monitor.  If you are between 45 years and 39 years old, ask your health care provider if you should take aspirin to prevent strokes.  Have regular diabetes screenings. This involves taking a blood sample to check your fasting blood sugar level.  If you are at a normal weight and have a low risk for diabetes,  have this test once every three years after 68 years of age.  If you are overweight and have a high risk for diabetes, consider being tested at a younger age or more often. PREVENTING INFECTION  Hepatitis B  If you have a higher risk for hepatitis B, you should be screened for this virus. You are considered at high risk for hepatitis B if:  You were born in a country where hepatitis B is common. Ask your health care provider which countries are considered high risk.  Your parents were born in a high-risk country, and you have not been immunized against hepatitis B (hepatitis B vaccine).  You have HIV or AIDS.  You use needles to inject street drugs.  You live with someone who has hepatitis B.  You have had sex with someone who has hepatitis B.  You get hemodialysis treatment.  You take certain medicines for conditions, including cancer, organ transplantation, and autoimmune conditions. Hepatitis C  Blood testing is recommended for:  Everyone born from [birth year] through 1965.  Anyone with known risk factors for hepatitis C. Sexually transmitted infections (STIs)  You should be screened for sexually transmitted infections (STIs) including gonorrhea and chlamydia if:  You are sexually active and are younger than 69 years of age.  You are older than 69 years of age and your health care provider tells you that you are at risk for this type of infection.  Your sexual activity has changed since you were last screened and you are at an increased risk for chlamydia or gonorrhea. Ask your health care provider if you are at risk.  If you do not have HIV, but are at risk, it may be recommended that you take a prescription medicine daily to prevent HIV infection. This is called pre-exposure prophylaxis (PrEP). You are considered at risk if:  You are sexually active and do not regularly use condoms or know the HIV status of your partner(s).  You take drugs by injection.  You are sexually  active with a partner who has HIV. Talk with your health care provider about whether you are at high risk of being infected with HIV. If you choose to begin PrEP, you should first be tested for HIV. You should then be tested every 3 months for as long as you are taking PrEP.  PREGNANCY   If you are premenopausal and you may become pregnant, ask your health care provider about preconception counseling.  If you may  become pregnant, take 400 to 800 micrograms (mcg) of folic acid every day.  If you want to prevent pregnancy, talk to your health care provider about birth control (contraception). OSTEOPOROSIS AND MENOPAUSE   Osteoporosis is a disease in which the bones lose minerals and strength with aging. This can result in serious bone fractures. Your risk for osteoporosis can be identified using a bone density scan.  If you are 61 years of age or older, or if you are at risk for osteoporosis and fractures, ask your health care provider if you should be screened.  Ask your health care provider whether you should take a calcium or vitamin D supplement to lower your risk for osteoporosis.  Menopause may have certain physical symptoms and risks.  Hormone replacement therapy may reduce some of these symptoms and risks. Talk to your health care provider about whether hormone replacement therapy is right for you.  HOME CARE INSTRUCTIONS   Schedule regular health, dental, and eye exams.  Stay current with your immunizations.   Do not use any tobacco products including cigarettes, chewing tobacco, or electronic cigarettes.  If you are pregnant, do not drink alcohol.  If you are breastfeeding, limit how much and how often you drink alcohol.  Limit alcohol intake to no more than 1 drink per day for nonpregnant women. One drink equals 12 ounces of beer, 5 ounces of wine, or 1 ounces of hard liquor.  Do not use street drugs.  Do not share needles.  Ask your health care provider for help if  you need support or information about quitting drugs.  Tell your health care provider if you often feel depressed.  Tell your health care provider if you have ever been abused or do not feel safe at home.   This information is not intended to replace advice given to you by your health care provider. Make sure you discuss any questions you have with your health care provider.   Document Released: 05/31/2011 Document Revised: 12/06/2014 Document Reviewed: 10/17/2013 Elsevier Interactive Patient Education [employer].

## 2015-10-06 NOTE — Addendum Note (Signed)
Addended by: Vernetta Honey on: 10/06/2015 05:16 PM   Modules accepted: Orders

## 2015-10-06 NOTE — Assessment & Plan Note (Signed)
Wt Readings from Last 3 Encounters:  10/06/15 196 lb 8 oz (89.132 kg)  04/30/15 200 lb (90.719 kg)  04/22/15 200 lb (90.719 kg)   Body mass index is 35.93 kg/(m^2). Encouraged healthy diet and exercise.

## 2015-10-06 NOTE — Assessment & Plan Note (Signed)
Bone density testing scheduled. Vit D with labs.

## 2015-10-06 NOTE — Progress Notes (Signed)
The patient is here for annual Medicare Wellness Examination and management of other chronic and acute problems.   The risk factors are reflected in the history.  The roster of all physicians providing medical care to patient - is listed in the Snapshot section of the chart.  Activities of daily living:   The patient is 100% independent in all ADLs: dressing, toileting, feeding as well as independent mobility. Patient lives with husband. No pets. One story home. Both hard and carpeted floors. One fall this year at work, slipped on sidewalk, sprained ankle as result.  Home safety :  The patient has smoke detectors in the home.  They wear seatbelts in their car. There are no firearms at home.  There is no violence in the home. They feel safe where they live.  Infectious Risks: There is no risks for hepatitis, STDs or HIV.  There is no  history of blood transfusion.  They have no travel history to infectious disease endemic areas of the world.  Additional Health Care Providers: The patient has seen their dentist in the last six months. Dentist - Dr. Duffy Bruce They have seen their eye doctor in the last year. Opthalmologist - [facility] They deny hearing issues. They have deferred audiologic testing in the last year.   They do not  have excessive sun exposure. Discussed the need for sun protection: hats,long sleeves and use of sunscreen if there is significant sun exposure.  Dermatologist - seen at [facility] by [facility], Dr. Treasa School - Dr. Shela Leff  Diet: the importance of a healthy diet is discussed. They do have a healthy diet.  The benefits of regular aerobic exercise were discussed. Limited exercise since surgery.   Depression screen: there are no signs or vegative symptoms of depression- irritability, change in appetite, anhedonia, sadness/tearfullness.  Cognitive assessment: the patient manages all their financial and personal affairs and is actively engaged.  They could relate day,date,year and events.  HCPOA - husband, [facility] Will - yes, in place  The following portions of the patient's history were reviewed and updated as appropriate: allergies, current medications, past family history, past medical history,  past surgical history, past social history and problem list.  Visual acuity was not assessed per patient preference as they have regular follow up with their ophthalmologist. Hearing and body mass index were assessed and reviewed.   During the course of the visit the patient was educated and counseled about appropriate screening and preventive services including : fall prevention , diabetes screening, nutrition counseling, colorectal cancer screening, and recommended immunizations.    Past Medical History  Diagnosis Date  . Osteopenia     took Boniva 1 1/2 years in past-doesn't want to restart it  . Positive PPD     as a child. xray was normal.  . Personal history of tobacco use, presenting hazards to health   . Systemic sclerosis ([place])   . Lump or mass in breast 2011/2012    benign  . Obesity, unspecified   . Family history of malignant neoplasm of breast     Maternal/paternal aunts and cousin  . Hypertension   . Arthritis    Family History  Problem Relation Age of Onset  . Lung cancer Father   . Heart disease Father 19    Cardiac Arrest  . Heart disease Mother   . Hypertension Mother   . Stroke Mother   . Breast cancer      Aunts  . Other  hypercoag-family history  . Other Brother     blood clots  . Cancer Maternal Aunt     Breast cancer   Social History   Social History  . Marital Status: Married    Spouse Name: N/A  . Number of Children: 2  . Years of Education: 15   Occupational History  . [place]   Social History Main Topics  . Smoking status: Former Smoker -- 1.00 packs/day for 50 years    Types: Cigarettes  . Smokeless tobacco: Former Systems developer     Quit date: 04/30/2015  . Alcohol Use: 0.0 oz/week    0 Standard drinks or equivalent per week     Comment: occasional glassof wine   . Drug Use: No  . Sexual Activity: Yes    Birth Control/ Protection: Post-menopausal   Other Topics Concern  . None   Social History Narrative   Pt is 69yo female. Pt was born in [place], moved to [place] when she was 70.  Pt is married to husband of 22 years.  Pt has 2 sons from a previous marriage and 4 step sons and 75 Grandchildren.  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  Pt works out at gym 3/week (2 days a week in a class, 1 day a week with an Dietitian).  Pt enjoys reading, spending time with her family. Pt and her husband are members of [employer] and active in their church.    Past Surgical History  Procedure Laterality Date  . Tonsillectomy  1968  . Colonoscopy  8/05    Dr. Vira Agar  . Bunionectomy  2010  . Appendectomy  1961  . Tubal ligation  1974  . Breast biopsy Right Nov 2011    benign  . Breast biopsy Left Nov 2012    dilated ducts-benign  . Decompressive lumbar laminectomy level 2 N/A 04/30/2015    Procedure: CENTRAL DECOMPRESSIVE LUMBER LAMINECTOMY WITH POSSIBLE FORAMINOTOMIES L3-L4,L4-L5;  Surgeon: Latanya Maudlin, MD;  Location: [facility];  Service: Orthopedics;  Laterality: N/A;   Wt Readings from Last 3 Encounters:  10/06/15 196 lb 8 oz (89.132 kg)  04/30/15 200 lb (90.719 kg)  04/22/15 200 lb (90.719 kg)   BP Readings from Last 3 Encounters:  10/06/15 125/67  05/01/15 103/54  04/22/15 124/75     Review of Systems  Constitutional: Negative for fever, chills, appetite change, fatigue and unexpected weight change.  Eyes: Negative for visual disturbance.  Respiratory: Negative for shortness of breath and wheezing.   Cardiovascular: Negative for chest pain and leg swelling.  Gastrointestinal: Negative for nausea, vomiting, abdominal pain, diarrhea and constipation.   Musculoskeletal: Negative for myalgias and arthralgias.  Skin: Negative for color change and rash.  Hematological: Negative for adenopathy. Does not bruise/bleed easily.  Psychiatric/Behavioral: Negative for sleep disturbance and dysphoric mood. The patient is not nervous/anxious.        Objective:    BP 125/67 mmHg  Pulse 68  Temp(Src) 97.9 F (36.6 C) (Oral)  Ht 5\' 2"  (1.575 m)  Wt 196 lb 8 oz (89.132 kg)  BMI 35.93 kg/m2  SpO2 97% Physical Exam  Constitutional: She is oriented to person, place, and time. She appears well-developed and well-nourished. No distress.  HENT:  Head: Normocephalic and atraumatic.  Right Ear: External ear normal.  Left Ear: External ear normal.  Nose: Nose normal.  Mouth/Throat: Oropharynx is clear and moist. No oropharyngeal exudate.  Eyes: Conjunctivae are normal. Pupils are  equal, round, and reactive to light. Right eye exhibits no discharge. Left eye exhibits no discharge. No scleral icterus.  Neck: Normal range of motion. Neck supple. No tracheal deviation present. No thyromegaly present.  Cardiovascular: Normal rate, regular rhythm, normal heart sounds and intact distal pulses.  Exam reveals no gallop and no friction rub.   No murmur heard. Pulmonary/Chest: Effort normal and breath sounds normal. No accessory muscle usage. No tachypnea. No respiratory distress. She has no decreased breath sounds. She has no wheezes. She has no rales. She exhibits no tenderness. Right breast exhibits no inverted nipple, no mass, no nipple discharge, no skin change and no tenderness. Left breast exhibits no inverted nipple, no mass, no nipple discharge, no skin change and no tenderness. Breasts are symmetrical.  Abdominal: Soft. Bowel sounds are normal. She exhibits no distension and no mass. There is no tenderness. There is no rebound and no guarding.  Musculoskeletal: Normal range of motion. She exhibits no edema or tenderness.  Lymphadenopathy:    She has no  cervical adenopathy.  Neurological: She is alert and oriented to person, place, and time. No cranial nerve deficit. She exhibits normal muscle tone. Coordination normal.  Skin: Skin is warm and dry. No rash noted. She is not diaphoretic. No erythema. No pallor.  Psychiatric: She has a normal mood and affect. Her behavior is normal. Judgment and thought content normal.          Assessment & Plan:  Patient was given a handout regarding current recommendations for health maintenance and preventative care on the AVS.  Problem List Items Addressed This Visit      Unprioritized   Medicare annual wellness visit, initial - Primary    General medical exam normal today including breast exam. PAP and pelvic deferred per preference and last PAP 2013 was normal, HPV neg. Bone density ordered. Mammogram ordered. Colonoscopy UTD and reviewed. Pneumovax given today. Other immunizations are UTD. Labs ordered through [facility]. Encouraged healthy diet and exercise.      Obesity (BMI 30-39.9)    Wt Readings from Last 3 Encounters:  10/06/15 196 lb 8 oz (89.132 kg)  04/30/15 200 lb (90.719 kg)  04/22/15 200 lb (90.719 kg)   Body mass index is 35.93 kg/(m^2). Encouraged healthy diet and exercise.      Postmenopausal estrogen deficiency    Bone density testing scheduled. Vit D with labs.      Relevant Orders   DG Bone Density   Screening for breast cancer    Breast exam normal today. Mammogram scheduled.      Relevant Orders   MM Digital Screening       Return in about 1 year (around 10/05/2016) for Wellness Visit.

## 2015-10-09 LAB — HEMOGLOBIN A1C: Hgb A1c MFr Bld: 5.7 % (ref 4.0–6.0)

## 2015-10-09 LAB — LIPID PANEL
Cholesterol: 165 mg/dL (ref 0–200)
HDL: 52 mg/dL (ref 35–70)
LDL Cholesterol: 96 mg/dL
Triglycerides: 86 mg/dL (ref 40–160)

## 2015-10-09 LAB — TSH: TSH: 1.01 u[IU]/mL (ref 0.41–5.90)

## 2015-10-09 LAB — CBC AND DIFFERENTIAL
HCT: 40 % (ref 36–46)
Hemoglobin: 13.5 g/dL (ref 12.0–16.0)
Neutrophils Absolute: 3 /uL
Platelets: 256 10*3/uL (ref 150–399)
WBC: 5 10^3/mL

## 2015-10-09 LAB — HEPATIC FUNCTION PANEL
ALT: 12 U/L (ref 7–35)
AST: 19 U/L (ref 13–35)
Alkaline Phosphatase: 59 U/L (ref 25–125)
Bilirubin, Total: 0.4 mg/dL

## 2015-10-09 LAB — BASIC METABOLIC PANEL
BUN: 18 mg/dL (ref 4–21)
Creatinine: 0.8 mg/dL (ref 0.5–1.1)
Glucose: 89 mg/dL
Potassium: 4.5 mmol/L (ref 3.4–5.3)
Sodium: 139 mmol/L (ref 137–147)

## 2015-10-22 ENCOUNTER — Ambulatory Visit
Admission: RE | Admit: 2015-10-22 | Discharge: 2015-10-22 | Disposition: A | Discharge status: Home / Self Care | Payer: PPO | Source: Ambulatory Visit | Attending: Internal Medicine | Admitting: Internal Medicine

## 2015-10-22 ENCOUNTER — Other Ambulatory Visit: Payer: Self-pay | Admitting: Internal Medicine

## 2015-10-22 DIAGNOSIS — Z780 Asymptomatic menopausal state: Secondary | ICD-10-CM | POA: Diagnosis not present

## 2015-10-22 DIAGNOSIS — Z1231 Encounter for screening mammogram for malignant neoplasm of breast: Secondary | ICD-10-CM | POA: Diagnosis not present

## 2015-10-22 DIAGNOSIS — Z1239 Encounter for other screening for malignant neoplasm of breast: Secondary | ICD-10-CM

## 2015-11-01 ENCOUNTER — Other Ambulatory Visit: Payer: Self-pay | Admitting: Internal Medicine

## 2015-11-02 ENCOUNTER — Encounter: Payer: Self-pay | Admitting: Internal Medicine

## 2015-11-03 ENCOUNTER — Encounter: Payer: Self-pay | Admitting: *Deleted

## 2015-11-03 ENCOUNTER — Other Ambulatory Visit: Payer: Self-pay | Admitting: *Deleted

## 2015-11-03 MED ORDER — HYDROCHLOROTHIAZIDE 25 MG PO TABS: 12.5000 mg | ORAL_TABLET | Freq: Every day | ORAL | Status: DC

## 2015-11-04 ENCOUNTER — Encounter: Payer: Self-pay | Admitting: *Deleted

## 2016-01-06 ENCOUNTER — Encounter: Payer: Self-pay | Admitting: Internal Medicine

## 2016-01-06 DIAGNOSIS — M25559 Pain in unspecified hip: Secondary | ICD-10-CM

## 2016-01-20 DIAGNOSIS — D2272 Melanocytic nevi of left lower limb, including hip: Secondary | ICD-10-CM | POA: Diagnosis not present

## 2016-01-20 DIAGNOSIS — D225 Melanocytic nevi of trunk: Secondary | ICD-10-CM | POA: Diagnosis not present

## 2016-01-20 DIAGNOSIS — R208 Other disturbances of skin sensation: Secondary | ICD-10-CM | POA: Diagnosis not present

## 2016-01-20 DIAGNOSIS — S70921A Unspecified superficial injury of right thigh, initial encounter: Secondary | ICD-10-CM | POA: Diagnosis not present

## 2016-01-20 DIAGNOSIS — S20402A Unspecified superficial injuries of left back wall of thorax, initial encounter: Secondary | ICD-10-CM | POA: Diagnosis not present

## 2016-01-20 DIAGNOSIS — D2261 Melanocytic nevi of right upper limb, including shoulder: Secondary | ICD-10-CM | POA: Diagnosis not present

## 2016-01-20 DIAGNOSIS — L820 Inflamed seborrheic keratosis: Secondary | ICD-10-CM | POA: Diagnosis not present

## 2016-01-20 DIAGNOSIS — L989 Disorder of the skin and subcutaneous tissue, unspecified: Secondary | ICD-10-CM | POA: Diagnosis not present

## 2016-01-20 DIAGNOSIS — D485 Neoplasm of uncertain behavior of skin: Secondary | ICD-10-CM | POA: Diagnosis not present

## 2016-01-20 DIAGNOSIS — L638 Other alopecia areata: Secondary | ICD-10-CM | POA: Diagnosis not present

## 2016-01-20 DIAGNOSIS — D2271 Melanocytic nevi of right lower limb, including hip: Secondary | ICD-10-CM | POA: Diagnosis not present

## 2016-02-09 DIAGNOSIS — M25551 Pain in right hip: Secondary | ICD-10-CM | POA: Diagnosis not present

## 2016-02-12 DIAGNOSIS — M25551 Pain in right hip: Secondary | ICD-10-CM | POA: Diagnosis not present

## 2016-02-16 DIAGNOSIS — M25551 Pain in right hip: Secondary | ICD-10-CM | POA: Diagnosis not present

## 2016-02-19 DIAGNOSIS — M25551 Pain in right hip: Secondary | ICD-10-CM | POA: Diagnosis not present

## 2016-02-23 DIAGNOSIS — M7061 Trochanteric bursitis, right hip: Secondary | ICD-10-CM | POA: Diagnosis not present

## 2016-05-24 ENCOUNTER — Encounter: Payer: Self-pay | Admitting: Internal Medicine

## 2016-08-21 ENCOUNTER — Ambulatory Visit: Payer: PPO

## 2016-08-23 ENCOUNTER — Ambulatory Visit: Payer: PPO

## 2016-08-25 ENCOUNTER — Ambulatory Visit (INDEPENDENT_AMBULATORY_CARE_PROVIDER_SITE_OTHER): Payer: PPO

## 2016-08-25 ENCOUNTER — Ambulatory Visit: Payer: PPO

## 2016-08-25 DIAGNOSIS — Z23 Encounter for immunization: Secondary | ICD-10-CM | POA: Diagnosis not present

## 2016-09-13 ENCOUNTER — Other Ambulatory Visit: Payer: Self-pay | Admitting: Family

## 2016-09-20 ENCOUNTER — Encounter: Payer: Self-pay | Admitting: Family

## 2016-09-21 ENCOUNTER — Other Ambulatory Visit: Payer: Self-pay | Admitting: Family

## 2016-09-21 DIAGNOSIS — Z0000 Encounter for general adult medical examination without abnormal findings: Secondary | ICD-10-CM

## 2016-09-22 ENCOUNTER — Other Ambulatory Visit (INDEPENDENT_AMBULATORY_CARE_PROVIDER_SITE_OTHER): Payer: PPO

## 2016-09-22 DIAGNOSIS — Z0000 Encounter for general adult medical examination without abnormal findings: Secondary | ICD-10-CM

## 2016-09-22 LAB — COMPREHENSIVE METABOLIC PANEL
ALT: 17 U/L (ref 0–35)
AST: 20 U/L (ref 0–37)
Albumin: 4.1 g/dL (ref 3.5–5.2)
Alkaline Phosphatase: 74 U/L (ref 39–117)
BUN: 18 mg/dL (ref 6–23)
CO2: 31 mEq/L (ref 19–32)
Calcium: 9.6 mg/dL (ref 8.4–10.5)
Chloride: 101 mEq/L (ref 96–112)
Creatinine, Ser: 0.9 mg/dL (ref 0.40–1.20)
GFR: 65.82 mL/min (ref >60.00)
Glucose, Bld: 95 mg/dL (ref 70–99)
Potassium: 4.1 mEq/L (ref 3.5–5.1)
Sodium: 138 mEq/L (ref 135–145)
Total Bilirubin: 0.6 mg/dL (ref 0.2–1.2)
Total Protein: 7.1 g/dL (ref 6.0–8.3)

## 2016-09-22 LAB — LIPID PANEL
Cholesterol: 192 mg/dL (ref 0–200)
HDL: 60.4 mg/dL (ref >39.00)
LDL Cholesterol: 113 mg/dL — ABNORMAL HIGH (ref 0–99)
NonHDL: 131.56
Total CHOL/HDL Ratio: 3
Triglycerides: 92 mg/dL (ref 0.0–149.0)
VLDL: 18.4 mg/dL (ref 0.0–40.0)

## 2016-09-22 LAB — CBC WITH DIFFERENTIAL/PLATELET
Basophils Absolute: 0 10*3/uL (ref 0.0–0.1)
Basophils Relative: 0.5 % (ref 0.0–3.0)
Eosinophils Absolute: 0.1 10*3/uL (ref 0.0–0.7)
Eosinophils Relative: 1.1 % (ref 0.0–5.0)
HCT: 41.1 % (ref 36.0–46.0)
Hemoglobin: 13.8 g/dL (ref 12.0–15.0)
Lymphocytes Relative: 33.2 % (ref 12.0–46.0)
Lymphs Abs: 1.8 10*3/uL (ref 0.7–4.0)
MCHC: 33.7 g/dL (ref 30.0–36.0)
MCV: 89.2 fl (ref 78.0–100.0)
Monocytes Absolute: 0.6 10*3/uL (ref 0.1–1.0)
Monocytes Relative: 10.6 % (ref 3.0–12.0)
Neutro Abs: 2.9 10*3/uL (ref 1.4–7.7)
Neutrophils Relative %: 54.6 % (ref 43.0–77.0)
Platelets: 253 10*3/uL (ref 150.0–400.0)
RBC: 4.61 Mil/uL (ref 3.87–5.11)
RDW: 13.5 % (ref 11.5–15.5)
WBC: 5.4 10*3/uL (ref 4.0–10.5)

## 2016-09-22 LAB — TSH: TSH: 1.03 u[IU]/mL (ref 0.35–4.50)

## 2016-09-22 LAB — VITAMIN D 25 HYDROXY (VIT D DEFICIENCY, FRACTURES): VITD: 23.84 ng/mL — ABNORMAL LOW (ref 30.00–100.00)

## 2016-09-22 LAB — HEMOGLOBIN A1C: Hgb A1c MFr Bld: 5.6 % (ref 4.6–6.5)

## 2016-09-23 LAB — HEPATITIS C ANTIBODY: HCV Ab: NEGATIVE

## 2016-10-06 ENCOUNTER — Ambulatory Visit (INDEPENDENT_AMBULATORY_CARE_PROVIDER_SITE_OTHER): Payer: PPO | Admitting: Family

## 2016-10-06 ENCOUNTER — Encounter: Payer: PPO | Admitting: Family Medicine

## 2016-10-06 ENCOUNTER — Encounter: Payer: Self-pay | Admitting: Family

## 2016-10-06 ENCOUNTER — Other Ambulatory Visit: Payer: Self-pay | Admitting: Family

## 2016-10-06 ENCOUNTER — Encounter: Payer: PPO | Admitting: Internal Medicine

## 2016-10-06 VITALS — BP 118/76 | HR 86 | Temp 98.6°F | Resp 18 | Ht 62.0 in | Wt 210.4 lb

## 2016-10-06 DIAGNOSIS — I10 Essential (primary) hypertension: Secondary | ICD-10-CM

## 2016-10-06 DIAGNOSIS — Z1231 Encounter for screening mammogram for malignant neoplasm of breast: Secondary | ICD-10-CM

## 2016-10-06 DIAGNOSIS — Z0000 Encounter for general adult medical examination without abnormal findings: Secondary | ICD-10-CM | POA: Diagnosis not present

## 2016-10-06 MED ORDER — HYDROCHLOROTHIAZIDE 25 MG PO TABS: 12.5000 mg | ORAL_TABLET | Freq: Every day | ORAL | 4 refills | Status: DC

## 2016-10-06 NOTE — Assessment & Plan Note (Addendum)
UTD colonoscopy. Mammogram scheduled. She no longer dose Pap smears. Defers pelvic exam based on age and preference. No pelvic complaints. DEXA 2018. Advised CT lung per smoking history and she declined today. Advised to go to local pharmacy for Tdap. Screening labs done including HCV. Encouraged exercise.

## 2016-10-06 NOTE — Progress Notes (Signed)
Subjective:    Patient ID: Valerie Curry, female    DOB: [birth year], 70 y.o.   MRN: [id number]  CC: AMPARO Curry is a 70 y.o. female who presents today for physical exam.    HPI: Here for CPE.  Feeling well. No complaints.     Colorectal Cancer Screening: UTD , 2015 Dr Thurman Coyer, Breast Cancer Screening: Mammogram due, will schedule, h/o breast biopsy Cervical Cancer Screening: last 2013 and her last one. No h/o abnormal or GYN cancer.  Bone Health screening/DEXA for 65+: h/o osteopenia 2016. Will do DEXA 2018. Lung Cancer Screening: Has 30 year pack year history and age > 78 years. Will let me know if would like to order.  Immunizations       Tetanus - Due        Pneumococcal - Completed Hepatitis C screening - Done Labs: Screening labs done. Exercise: Gets regular exercise.  Alcohol use: occasioanal Smoking/tobacco use: Former smoker; quit last year. Regular dental exams: UTD Wears seat belt: Yes.  HISTORY:  Past Medical History:  Diagnosis Date  . Arthritis   . Family history of malignant neoplasm of breast    Maternal/paternal aunts and cousin  . Hypertension   . Lump or mass in breast 2011/2012   benign  . Obesity, unspecified   . Osteopenia    took Boniva 1 1/2 years in past-doesn't want to restart it  . Personal history of tobacco use, presenting hazards to health   . Positive PPD    as a child. xray was normal.  . Systemic sclerosis [facility])     Past Surgical History:  Procedure Laterality Date  . APPENDECTOMY  1961  . BREAST BIOPSY Right Nov 2011   benign  . BREAST BIOPSY Left Nov 2012   dilated ducts-benign  . BUNIONECTOMY  2010  . COLONOSCOPY  8/05   Dr. Vira Agar  . DECOMPRESSIVE LUMBAR LAMINECTOMY LEVEL 2 N/A 04/30/2015   Procedure: CENTRAL DECOMPRESSIVE LUMBER LAMINECTOMY WITH POSSIBLE FORAMINOTOMIES L3-L4,L4-L5;  Surgeon: Latanya Maudlin, MD;  Location: [facility];  Service: Orthopedics;  Laterality: N/A;  . TONSILLECTOMY  1968  . TUBAL LIGATION   1974   Family History  Problem Relation Age of Onset  . Lung cancer Father   . Heart disease Father 81    Cardiac Arrest  . Heart disease Mother   . Hypertension Mother   . Stroke Mother   . Other Brother     blood clots  . Breast cancer      Aunts  . Other      hypercoag-family history  . Cancer Maternal Aunt     Breast cancer  . Breast cancer Maternal Aunt 72  . Breast cancer Paternal Aunt 107      ALLERGIES: Patient has no known allergies.  Current Outpatient Prescriptions on File Prior to Visit  Medication Sig Dispense Refill  . Ascorbic Acid (VITAMIN C) 1000 MG tablet Take 1,000 mg by mouth every morning.     Marland Kitchen aspirin 81 MG tablet Take 81 mg by mouth every morning.     . Calcium Carbonate-Vit D-Min 600-400 MG-UNIT TABS Take 1 tablet by mouth 2 (two) times daily.      . naproxen (NAPROSYN) 250 MG tablet Take 500 mg by mouth 2 (two) times daily as needed for mild pain.     No current facility-administered medications on file prior to visit.     Social History  Substance Use Topics  . Smoking status: Former Smoker  Packs/day: 1.00    Years: 50.00    Types: Cigarettes  . Smokeless tobacco: Former Systems developer    Quit date: 04/30/2015  . Alcohol use 0.0 oz/week     Comment: occasional glassof wine     Review of Systems  Constitutional: Negative for chills, fever and unexpected weight change.  HENT: Negative for congestion.   Respiratory: Negative for cough.   Cardiovascular: Negative for chest pain, palpitations and leg swelling.  Gastrointestinal: Negative for nausea and vomiting.  Musculoskeletal: Negative for arthralgias and myalgias.  Skin: Negative for rash.  Neurological: Negative for headaches.  Hematological: Negative for adenopathy.  Psychiatric/Behavioral: Negative for confusion.      Objective:    BP 118/76 (BP Location: Left Arm, Patient Position: Sitting, Cuff Size: Large)   Pulse 86   Temp 98.6 F (37 C) (Oral)   Resp 18   Ht 5\' 2"  (1.575 m)    Wt 210 lb 6 oz (95.4 kg)   SpO2 98%   BMI 38.48 kg/m   BP Readings from Last 3 Encounters:  10/06/16 118/76  10/06/15 125/67  05/01/15 (!) 103/54   Wt Readings from Last 3 Encounters:  10/06/16 210 lb 6 oz (95.4 kg)  10/06/15 196 lb 8 oz (89.1 kg)  04/30/15 200 lb (90.7 kg)    Physical Exam  Constitutional: She appears well-developed and well-nourished.  Eyes: Conjunctivae are normal.  Neck: No thyroid mass and no thyromegaly present.  Cardiovascular: Normal rate, regular rhythm, normal heart sounds and normal pulses.   Pulmonary/Chest: Effort normal and breath sounds normal. She has no wheezes. She has no rhonchi. She has no rales. Right breast exhibits no inverted nipple, no mass, no nipple discharge, no skin change and no tenderness. Left breast exhibits no inverted nipple, no mass, no nipple discharge, no skin change and no tenderness. Breasts are symmetrical.  CBE performed.   Lymphadenopathy:       Head (right side): No submental, no submandibular, no tonsillar, no preauricular, no posterior auricular and no occipital adenopathy present.       Head (left side): No submental, no submandibular, no tonsillar, no preauricular, no posterior auricular and no occipital adenopathy present.    She has no cervical adenopathy.       Right cervical: No superficial cervical, no deep cervical and no posterior cervical adenopathy present.      Left cervical: No superficial cervical, no deep cervical and no posterior cervical adenopathy present.    She has no axillary adenopathy.  Neurological: She is alert.  Skin: Skin is warm and dry.  Psychiatric: She has a normal mood and affect. Her speech is normal and behavior is normal. Thought content normal.  Vitals reviewed.      Assessment & Plan:   Problem List Items Addressed This Visit      Cardiovascular and Mediastinum   HTN (hypertension) - Primary    Controlled. Continue current regimen.      Relevant Medications    hydrochlorothiazide (HYDRODIURIL) 25 MG tablet     Other   Routine physical examination    UTD colonoscopy. Mammogram scheduled. She no longer dose Pap smears. Defers pelvic exam based on age and preference. No pelvic complaints. DEXA 2018. Advised CT lung per smoking history and she declined today. Advised to go to local pharmacy for Tdap. Screening labs done including HCV. Encouraged exercise.           I am having Ms. Oldenburg maintain her aspirin, vitamin C, Calcium Carbonate-Vit D-Min,  naproxen, and hydrochlorothiazide.   Meds ordered this encounter  Medications  . hydrochlorothiazide (HYDRODIURIL) 25 MG tablet    Sig: Take 0.5 tablets (12.5 mg total) by mouth daily.    Dispense:  90 tablet    Refill:  4    Return precautions given.   Risks, benefits, and alternatives of the medications and treatment plan prescribed today were discussed, and patient expressed understanding.   Education regarding symptom management and diagnosis given to patient on AVS.   Continue to follow with Mable Paris, FNP for routine health maintenance.   Valerie Curry and I agreed with plan.   Mable Paris, FNP

## 2016-10-06 NOTE — Patient Instructions (Signed)
Let me know about CT scan of your chest.  Pleasure meeting you.  Menopause is a normal process in which your reproductive ability comes to an end. This process happens gradually over a span of months to years, usually between the ages of 70 and 70. Menopause is complete when you have missed 12 consecutive menstrual periods. It is important to talk with your health care provider about some of the most common conditions that affect postmenopausal women, such as heart disease, cancer, and bone loss (osteoporosis). Adopting a healthy lifestyle and getting preventive care can help to promote your health and wellness. Those actions can also lower your chances of developing some of these common conditions. WHAT SHOULD I KNOW ABOUT MENOPAUSE? During menopause, you may experience a number of symptoms, such as:  Moderate-to-severe hot flashes.  Night sweats.  Decrease in sex drive.  Mood swings.  Headaches.  Tiredness.  Irritability.  Memory problems.  Insomnia. Choosing to treat or not to treat menopausal changes is an individual decision that you make with your health care provider. WHAT SHOULD I KNOW ABOUT HORMONE REPLACEMENT THERAPY AND SUPPLEMENTS? Hormone therapy products are effective for treating symptoms that are associated with menopause, such as hot flashes and night sweats. Hormone replacement carries certain risks, especially as you become older. If you are thinking about using estrogen or estrogen with progestin treatments, discuss the benefits and risks with your health care provider. WHAT SHOULD I KNOW ABOUT HEART DISEASE AND STROKE? Heart disease, heart attack, and stroke become more likely as you age. This may be due, in part, to the hormonal changes that your body experiences during menopause. These can affect how your body processes dietary fats, triglycerides, and cholesterol. Heart attack and stroke are both medical emergencies. There are many things that you can do to help  prevent heart disease and stroke:  Have your blood pressure checked at least every 1-2 years. High blood pressure causes heart disease and increases the risk of stroke.  If you are 24-74 years old, ask your health care provider if you should take aspirin to prevent a heart attack or a stroke.  Do not use any tobacco products, including cigarettes, chewing tobacco, or electronic cigarettes. If you need help quitting, ask your health care provider.  It is important to eat a healthy diet and maintain a healthy weight.  Be sure to include plenty of vegetables, fruits, low-fat dairy products, and lean protein.  Avoid eating foods that are high in solid fats, added sugars, or salt (sodium).  Get regular exercise. This is one of the most important things that you can do for your health.  Try to exercise for at least 150 minutes each week. The type of exercise that you do should increase your heart rate and make you sweat. This is known as moderate-intensity exercise.  Try to do strengthening exercises at least twice each week. Do these in addition to the moderate-intensity exercise.  Know your numbers.Ask your health care provider to check your cholesterol and your blood glucose. Continue to have your blood tested as directed by your health care provider. WHAT SHOULD I KNOW ABOUT CANCER SCREENING? There are several types of cancer. Take the following steps to reduce your risk and to catch any cancer development as early as possible. Breast Cancer  Practice breast self-awareness.  This means understanding how your breasts normally appear and feel.  It also means doing regular breast self-exams. Let your health care provider know about any changes, no matter  how small.  If you are 70 or older, have a clinician do a breast exam (clinical breast exam or CBE) every year. Depending on your age, family history, and medical history, it may be recommended that you also have a yearly breast X-ray  (mammogram).  If you have a family history of breast cancer, talk with your health care provider about genetic screening.  If you are at high risk for breast cancer, talk with your health care provider about having an MRI and a mammogram every year.  Breast cancer (BRCA) gene test is recommended for women who have family members with BRCA-related cancers. Results of the assessment will determine the need for genetic counseling and BRCA1 and for BRCA2 testing. BRCA-related cancers include these types:  Breast. This occurs in males or females.  Ovarian.  Tubal. This may also be called fallopian tube cancer.  Cancer of the abdominal or pelvic lining (peritoneal cancer).  Prostate.  Pancreatic. Cervical, Uterine, and Ovarian Cancer Your health care provider may recommend that you be screened regularly for cancer of the pelvic organs. These include your ovaries, uterus, and vagina. This screening involves a pelvic exam, which includes checking for microscopic changes to the surface of your cervix (Pap test).  For women ages 21-65, health care providers may recommend a pelvic exam and a Pap test every three years. For women ages 25-65, they may recommend the Pap test and pelvic exam, combined with testing for human papilloma virus (HPV), every five years. Some types of HPV increase your risk of cervical cancer. Testing for HPV may also be done on women of any age who have unclear Pap test results.  Other health care providers may not recommend any screening for nonpregnant women who are considered low risk for pelvic cancer and have no symptoms. Ask your health care provider if a screening pelvic exam is right for you.  If you have had past treatment for cervical cancer or a condition that could lead to cancer, you need Pap tests and screening for cancer for at least 20 years after your treatment. If Pap tests have been discontinued for you, your risk factors (such as having a new sexual partner)  need to be reassessed to determine if you should start having screenings again. Some women have medical problems that increase the chance of getting cervical cancer. In these cases, your health care provider may recommend that you have screening and Pap tests more often.  If you have a family history of uterine cancer or ovarian cancer, talk with your health care provider about genetic screening.  If you have vaginal bleeding after reaching menopause, tell your health care provider.  There are currently no reliable tests available to screen for ovarian cancer. Lung Cancer Lung cancer screening is recommended for adults 12-51 years old who are at high risk for lung cancer because of a history of smoking. A yearly low-dose CT scan of the lungs is recommended if you:  Currently smoke.  Have a history of at least 30 pack-years of smoking and you currently smoke or have quit within the past 15 years. A pack-year is smoking an average of one pack of cigarettes per day for one year. Yearly screening should:  Continue until it has been 15 years since you quit.  Stop if you develop a health problem that would prevent you from having lung cancer treatment. Colorectal Cancer  This type of cancer can be detected and can often be prevented.  Routine colorectal cancer screening  usually begins at age 39 and continues through age 55.  If you have risk factors for colon cancer, your health care provider may recommend that you be screened at an earlier age.  If you have a family history of colorectal cancer, talk with your health care provider about genetic screening.  Your health care provider may also recommend using home test kits to check for hidden blood in your stool.  A small camera at the end of a tube can be used to examine your colon directly (sigmoidoscopy or colonoscopy). This is done to check for the earliest forms of colorectal cancer.  Direct examination of the colon should be repeated  every 5-10 years until age 63. However, if early forms of precancerous polyps or small growths are found or if you have a family history or genetic risk for colorectal cancer, you may need to be screened more often. Skin Cancer  Check your skin from head to toe regularly.  Monitor any moles. Be sure to tell your health care provider:  About any new moles or changes in moles, especially if there is a change in a mole's shape or color.  If you have a mole that is larger than the size of a pencil eraser.  If any of your family members has a history of skin cancer, especially at a young age, talk with your health care provider about genetic screening.  Always use sunscreen. Apply sunscreen liberally and repeatedly throughout the day.  Whenever you are outside, protect yourself by wearing long sleeves, pants, a wide-brimmed hat, and sunglasses. WHAT SHOULD I KNOW ABOUT OSTEOPOROSIS? Osteoporosis is a condition in which bone destruction happens more quickly than new bone creation. After menopause, you may be at an increased risk for osteoporosis. To help prevent osteoporosis or the bone fractures that can happen because of osteoporosis, the following is recommended:  If you are 39-35 years old, get at least 1,000 mg of calcium and at least 600 mg of vitamin D per day.  If you are older than age 63 but younger than age 84, get at least 1,200 mg of calcium and at least 600 mg of vitamin D per day.  If you are older than age 51, get at least 1,200 mg of calcium and at least 800 mg of vitamin D per day. Smoking and excessive alcohol intake increase the risk of osteoporosis. Eat foods that are rich in calcium and vitamin D, and do weight-bearing exercises several times each week as directed by your health care provider. WHAT SHOULD I KNOW ABOUT HOW MENOPAUSE AFFECTS [place]? Depression may occur at any age, but it is more common as you become older. Common symptoms of depression  include:  Low or sad mood.  Changes in sleep patterns.  Changes in appetite or eating patterns.  Feeling an overall lack of motivation or enjoyment of activities that you previously enjoyed.  Frequent crying spells. Talk with your health care provider if you think that you are experiencing depression. WHAT SHOULD I KNOW ABOUT IMMUNIZATIONS? It is important that you get and maintain your immunizations. These include:  Tetanus, diphtheria, and pertussis (Tdap) booster vaccine.  Influenza every year before the flu season begins.  Pneumonia vaccine.  Shingles vaccine. Your health care provider may also recommend other immunizations.   This information is not intended to replace advice given to you by your health care provider. Make sure you discuss any questions you have with your health care provider.   Document  Released: 01/07/2006 Document Revised: 12/06/2014 Document Reviewed: 07/18/2014 Elsevier Interactive Patient Education [employer].

## 2016-10-07 NOTE — Assessment & Plan Note (Signed)
Controlled. Continue current regimen. 

## 2016-11-11 ENCOUNTER — Ambulatory Visit: Payer: PPO

## 2016-11-12 IMAGING — DX DG SPINE 1V PORT
1 series · 1 of 1 positions shown · non-contrast
Comparison: Intraoperative radiographs obtained earlier today at
[DATE] a.m.

CLINICAL DATA: 68-year-old female undergoing a planned L3-L4 and
L4-L5 fusion

EXAM:
PORTABLE SPINE - 1 VIEW

[l-spine x-table]
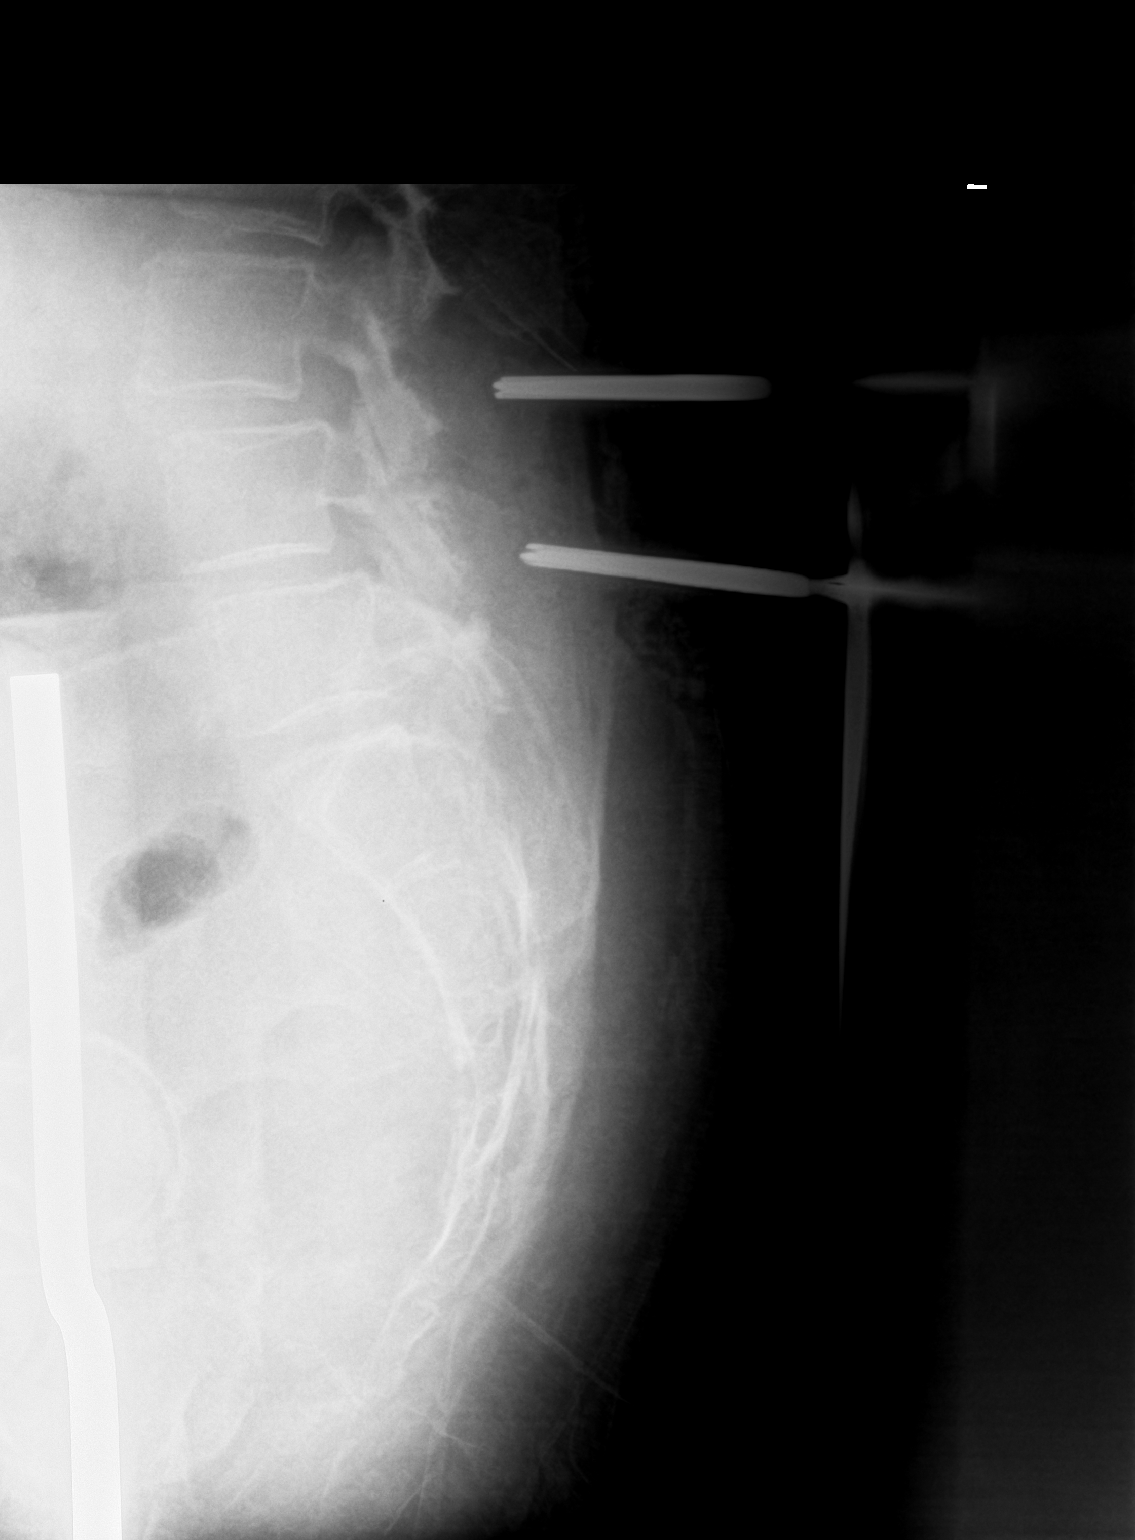

[1 of 1 positions shown; findings below may reference images not displayed]

FINDINGS: Two metallic probes are present within the soft tissues posterior to
the spine. The more superior probe overlies the L3 spinous process
while the more inferior probe overlies the L4 spinous process. There
is multilevel facet arthropathy. Mild grade 1 anterolisthesis of L3
on L4.
IMPRESSION: Intraoperative localization radiographs as above.

## 2016-11-12 IMAGING — DX DG SPINE 1V PORT
1 series · 1 of 1 positions shown · non-contrast
Comparison: April 22, 2015

CLINICAL DATA: Lumbar decompression

EXAM:
PORTABLE SPINE - 1 VIEW

[l-spine x-table]
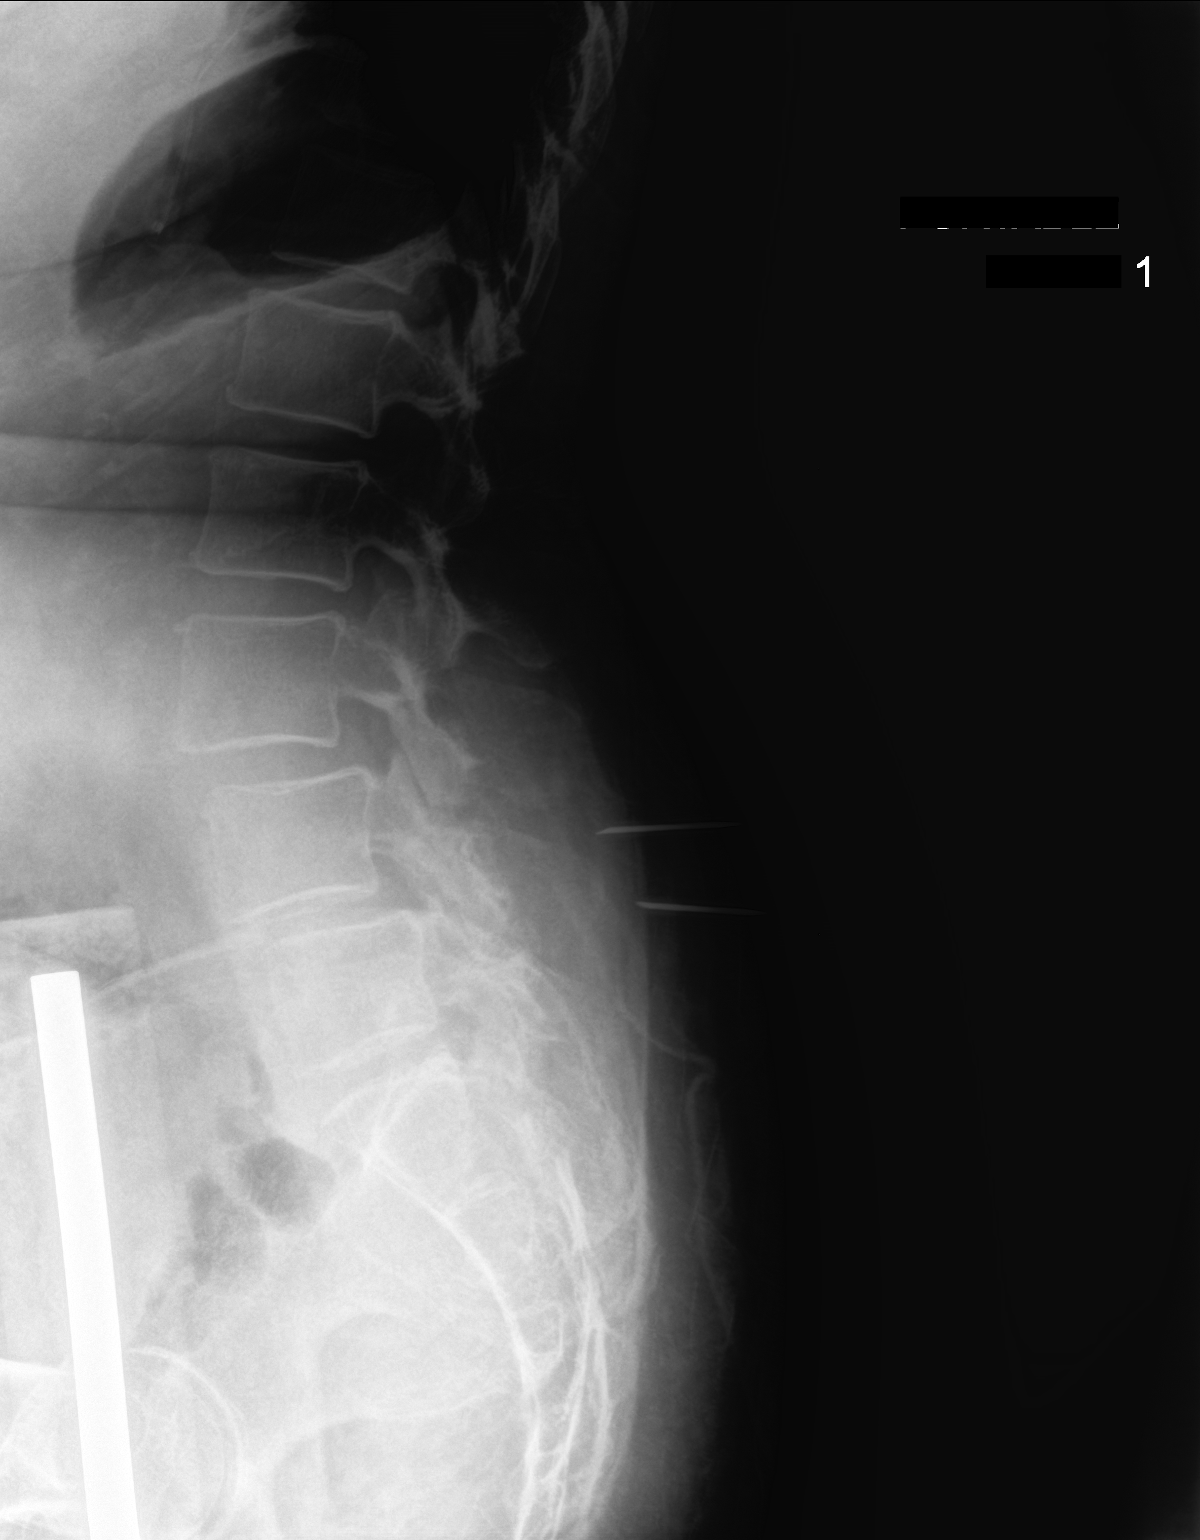

[1 of 1 positions shown; findings below may reference images not displayed]

FINDINGS: There is a metallic probe with the tip posterior to the inferior
aspect of the L4 vertebral body. The more inferior probe tip is
posterior to the superior aspect of the L5 vertebral body. There is
stable grade I/IV anterolisthesis of L3 on L4. There is grade I/IV
anterolisthesis of L4 on L5, stable. No other spondylolisthesis. No
fracture.
IMPRESSION: Metallic probes as described. Stable spondylolisthesis at L3-4 and
L4-5. No fracture.

## 2016-12-16 ENCOUNTER — Ambulatory Visit: Payer: PPO

## 2017-01-18 ENCOUNTER — Other Ambulatory Visit: Payer: Self-pay | Admitting: Family

## 2017-01-18 ENCOUNTER — Ambulatory Visit
Admission: RE | Admit: 2017-01-18 | Discharge: 2017-01-18 | Disposition: A | Discharge status: Home / Self Care | Payer: PPO | Source: Ambulatory Visit | Attending: Family | Admitting: Family

## 2017-01-18 DIAGNOSIS — Z1231 Encounter for screening mammogram for malignant neoplasm of breast: Secondary | ICD-10-CM | POA: Diagnosis not present

## 2017-02-07 ENCOUNTER — Ambulatory Visit: Payer: Self-pay | Admitting: Podiatry

## 2017-02-14 ENCOUNTER — Ambulatory Visit (INDEPENDENT_AMBULATORY_CARE_PROVIDER_SITE_OTHER): Payer: PPO | Admitting: Podiatry

## 2017-02-14 ENCOUNTER — Ambulatory Visit (INDEPENDENT_AMBULATORY_CARE_PROVIDER_SITE_OTHER): Payer: PPO

## 2017-02-14 ENCOUNTER — Encounter: Payer: Self-pay | Admitting: Podiatry

## 2017-02-14 DIAGNOSIS — M79672 Pain in left foot: Secondary | ICD-10-CM

## 2017-02-14 DIAGNOSIS — M722 Plantar fascial fibromatosis: Secondary | ICD-10-CM

## 2017-02-14 NOTE — Progress Notes (Signed)
   Subjective:    Patient ID: Valerie Curry, female    DOB: November 18, 1946, 71 y.o.   MRN: [id number]  HPI: She presents today states that her arch and plantar heel left were in severe pain about 10 days ago. She states today is really not hurting at all she taken naproxen and rested and iced it and dispensed and stopped exercising she states it has gotten better. Just wanted to have it checked out anyway.    Review of Systems  All other systems reviewed and are negative.      Objective:   Physical Exam: Vital signs are stable she is alert and oriented 3 pulses are strongly palpable. Neurologic sensorium is intact. Degenerative flexor intact. Muscle strength +5 over 5 dorsiflexion is reflected for his altered musculature is intact. Orthopedic evaluation of concerns all joints distal to the ankle for range of motion without crepitation. Continues evaluation shows supple well-hydrated daily she has no pain on palpation medially continued tubercle radiographs demonstrate no major osseous abnormalities.        Assessment & Plan:  Plantar fasciitis resolving left.  Plan: Discussed appropriate shoe gear stretching exercises ice therapy issue modifications provided her with stretching exercises will follow up with her on an as-needed basis.

## 2017-02-14 NOTE — Patient Instructions (Signed)

## 2017-02-22 ENCOUNTER — Ambulatory Visit: Payer: PPO

## 2017-03-03 ENCOUNTER — Ambulatory Visit (INDEPENDENT_AMBULATORY_CARE_PROVIDER_SITE_OTHER): Payer: PPO

## 2017-03-03 VITALS — BP 120/78 | HR 71 | Temp 98.0°F | Resp 14 | Ht 62.0 in | Wt 194.1 lb

## 2017-03-03 DIAGNOSIS — Z0000 Encounter for general adult medical examination without abnormal findings: Secondary | ICD-10-CM | POA: Diagnosis not present

## 2017-03-03 NOTE — Progress Notes (Signed)
Subjective:   Valerie Curry is a 71 y.o. female who presents for Medicare Annual (Subsequent) preventive examination.  Review of Systems:  No ROS.  Medicare Wellness Visit.  Cardiac Risk Factors include: advanced age (>58men, >33 women);hypertension     Objective:     Vitals: BP 120/78 (BP Location: Right Arm, Patient Position: Sitting, Cuff Size: Normal)   Pulse 71   Temp 98 F (36.7 C) (Oral)   Resp 14   Ht 5\' 2"  (1.575 m)   Wt 194 lb 1.9 oz (88.1 kg)   SpO2 99%   BMI 35.51 kg/m   Body mass index is 35.51 kg/m.   Tobacco History  Smoking Status  . Former Smoker  . Packs/day: 1.00  . Years: 50.00  . Types: Cigarettes  Smokeless Tobacco  . Former Systems developer  . Quit date: 04/30/2015     Counseling given: Not Answered   Past Medical History:  Diagnosis Date  . Arthritis   . Family history of malignant neoplasm of breast    Maternal/paternal aunts and cousin  . Hypertension   . Lump or mass in breast 2011/2012   benign  . Obesity, unspecified   . Osteopenia    took Boniva 1 1/2 years in past-doesn't want to restart it  . Personal history of tobacco use, presenting hazards to health   . Positive PPD    as a child. xray was normal.  . Systemic sclerosis [facility])    Past Surgical History:  Procedure Laterality Date  . APPENDECTOMY  1961  . BREAST BIOPSY Right Nov 2011   benign  . BREAST BIOPSY Left Nov 2012   dilated ducts-benign  . BUNIONECTOMY  2010  . COLONOSCOPY  8/05   Dr. Vira Agar  . DECOMPRESSIVE LUMBAR LAMINECTOMY LEVEL 2 N/A 04/30/2015   Procedure: CENTRAL DECOMPRESSIVE LUMBER LAMINECTOMY WITH POSSIBLE FORAMINOTOMIES L3-L4,L4-L5;  Surgeon: Latanya Maudlin, MD;  Location: [facility];  Service: Orthopedics;  Laterality: N/A;  . TONSILLECTOMY  1968  . TUBAL LIGATION  1974   Family History  Problem Relation Age of Onset  . Lung cancer Father   . Heart disease Father 81    Cardiac Arrest  . Heart disease Mother   . Hypertension Mother   . Stroke Mother     . Other Brother     blood clots  . Other      hypercoag-family history  . Cancer Maternal Aunt     Breast cancer  . Breast cancer Maternal Aunt 65  . Breast cancer Paternal Aunt 79   History  Sexual Activity  . Sexual activity: Yes  . Birth control/ protection: Post-menopausal    Outpatient Encounter Prescriptions as of 03/03/2017  Medication Sig  . Ascorbic Acid (VITAMIN C) 1000 MG tablet Take 1,000 mg by mouth every morning.   Marland Kitchen aspirin 81 MG tablet Take 81 mg by mouth every morning.   . Calcium Carbonate-Vit D-Min 600-400 MG-UNIT TABS Take 1 tablet by mouth 2 (two) times daily.    . hydrochlorothiazide (HYDRODIURIL) 25 MG tablet Take 0.5 tablets (12.5 mg total) by mouth daily.  . naproxen (NAPROSYN) 250 MG tablet Take 500 mg by mouth 2 (two) times daily as needed for mild pain.   No facility-administered encounter medications on file as of 03/03/2017.     Activities of Daily Living In your present state of health, do you have any difficulty performing the following activities: 03/03/2017  Hearing? N  Vision? N  Difficulty concentrating or making decisions? N  Walking or climbing stairs? N  Dressing or bathing? N  Doing errands, shopping? N  Preparing Food and eating ? N  Using the Toilet? N  In the past six months, have you accidently leaked urine? N  Do you have problems with loss of bowel control? N  Managing your Medications? N  Managing your Finances? N  Housekeeping or managing your Housekeeping? N  Some recent data might be hidden    Patient Care Team: Burnard Hawthorne, FNP as PCP - General (Family Medicine)    Assessment:    This is a routine wellness examination for Friona. The goal of the wellness visit is to assist the patient how to close the gaps in care and create a preventative care plan for the patient.   Taking calcium VIT D as appropriate/Osteoporosis risk reviewed.  Medications reviewed; taking without issues or barriers.  Safety issues  reviewed; smoke detectors in the home. No firearms in the home.  Wears seatbelts when driving or riding with others. Patient does wear sunscreen or protective clothing when in direct sunlight. No violence in the home.  Patient is alert, normal appearance, oriented to person/place/and time. Correctly identified the president of the [place], recall of 3/3 words, and performing simple calculations.  Patient displays appropriate judgement and can read correct time from watch face.  No new identified risk were noted.  No failures at ADL's or IADL's.   BMI- discussed the importance of a healthy diet, water intake and exercise. Educational material provided.   24 hour diet recall: Breakfast: Chicken bacon, 1 egg, ttoast Lunch: Yogurt, 3 small oranges/tangerines Dinner: Meat, green vegetable Daily fluid intake: 3 cups of caffeine, 6 cups of water  HTN- followed by PCP.  Dental- every six months.   Eye- Visual acuity not assessed per patient preference since they have regular follow up with the ophthalmologist.  Wears corrective lenses.  Sleep patterns- Sleeps 6-7 hours at night.  Wakes feeling rested.  Health maintenance gaps- closed.  Patient Concerns: None at this time. Follow up with PCP as needed.  Exercise Activities and Dietary recommendations Current Exercise Habits: Structured exercise class, Type of exercise: calisthenics;strength training/weights (Heart Healthy), Time (Minutes): 60, Frequency (Times/Week): 3, Weekly Exercise (Minutes/Week): 180, Intensity: Moderate  Goals    . Healthy Lifestyle          Stay active Stay hydrated Low carb foods      Fall Risk Fall Risk  03/03/2017 10/06/2016 10/06/2015 09/13/2014 08/31/2013  Falls in the past year? No No Yes No No  Number falls in past yr: - - 1 - -  Injury with Fall? - - Yes - -   Depression Screen PHQ 2/9 Scores 03/03/2017 10/06/2016 10/06/2015 09/13/2014  PHQ - 2 Score 0 0 0 1     Cognitive Function MMSE - Mini Mental  State Exam 03/03/2017  Orientation to time 5  Orientation to Place 5  Registration 3  Attention/ Calculation 5  Recall 3  Language- name 2 objects 2  Language- repeat 1  Language- follow 3 step command 3  Language- read & follow direction 1  Write a sentence 1  Copy design 1  Total score 30        Immunization History  Administered Date(s) Administered  . Influenza Split 08/28/2012, 09/05/2015  . Influenza Whole 07/30/2010  . Influenza, High Dose Seasonal PF 08/25/2016  . Influenza,inj,Quad PF,36+ Mos 08/16/2013  . Pneumococcal Conjugate-13 09/13/2014  . Pneumococcal Polysaccharide-23 07/04/2006, 10/06/2015  . Td 11/30/2003  .  Zoster 12/12/2007   Screening Tests Health Maintenance  Topic Date Due  . TETANUS/TDAP  03/28/2018 (Originally 11/29/2013)  . INFLUENZA VACCINE  06/29/2017  . MAMMOGRAM  01/18/2019  . COLONOSCOPY  06/05/2024  . DEXA SCAN  Completed  . Hepatitis C Screening  Completed  . PNA vac Low Risk Adult  Completed      Plan:    End of life planning; Advance aging; Advanced directives discussed. Copy of current HCPOA/Living Will requested.    Medicare Attestation I have personally reviewed: The patient's medical and social history Their use of alcohol, tobacco or illicit drugs Their current medications and supplements The patient's functional ability including ADLs,fall risks, home safety risks, cognitive, and hearing and visual impairment Diet and physical activities Evidence for depression   The patient's weight, height, BMI, and visual acuity have been recorded in the chart.  I have made referrals and provided education to the patient based on review of the above and I have provided the patient with a written personalized care plan for preventive services.    During the course of the visit the patient was educated and counseled about the following appropriate screening and preventive services:   Vaccines to include Pneumoccal, Influenza, Hepatitis  B, Td, Zostavax, HCV  Colorectal cancer screening-UTD  Bone density screening-UTD  Glaucoma screening-annual eye exam  Mammography-UTD  Nutrition counseling   Patient Instructions (the written plan) was given to the patient.   Varney Biles, LPN  03/02/6285

## 2017-03-03 NOTE — Patient Instructions (Addendum)
  Valerie Curry , Thank you for taking time to come for your Medicare Wellness Visit. I appreciate your ongoing commitment to your health goals. Please review the following plan we discussed and let me know if I can assist you in the future.   Follow up with Mable Paris as needed.    Bring a copy of your [place] and/or Living Will to be scanned into chart.  Have a great day!  These are the goals we discussed: Goals    . Healthy Lifestyle          Stay active Stay hydrated Low carb foods       This is a list of the screening recommended for you and Curry dates:  Health Maintenance  Topic Date Curry  . Tetanus Vaccine  03/28/2018*  . Flu Shot  06/29/2017  . Mammogram  01/18/2019  . Colon Cancer Screening  06/05/2024  . DEXA scan (bone density measurement)  Completed  .  Hepatitis C: One time screening is recommended by Center for Disease Control  (CDC) for  adults born from [birth year] through 1965.   Completed  . Pneumonia vaccines  Completed  *Topic was postponed. The date shown is not the original Curry date.

## 2017-03-09 NOTE — Progress Notes (Signed)
Care was provided under my supervision. I agree with the management as indicated in the note.  Shaylene Paganelli DO  

## 2017-08-18 ENCOUNTER — Ambulatory Visit (INDEPENDENT_AMBULATORY_CARE_PROVIDER_SITE_OTHER): Payer: PPO | Admitting: *Deleted

## 2017-08-18 DIAGNOSIS — Z23 Encounter for immunization: Secondary | ICD-10-CM

## 2017-10-04 ENCOUNTER — Other Ambulatory Visit: Payer: Self-pay | Admitting: Family

## 2017-10-04 ENCOUNTER — Encounter: Payer: Self-pay | Admitting: Family

## 2017-10-04 DIAGNOSIS — Z0000 Encounter for general adult medical examination without abnormal findings: Secondary | ICD-10-CM

## 2017-10-05 ENCOUNTER — Encounter: Payer: PPO | Admitting: Family

## 2017-10-13 ENCOUNTER — Encounter: Payer: PPO | Admitting: Family

## 2017-11-08 ENCOUNTER — Other Ambulatory Visit: Payer: PPO

## 2017-11-10 ENCOUNTER — Other Ambulatory Visit (INDEPENDENT_AMBULATORY_CARE_PROVIDER_SITE_OTHER): Payer: PPO

## 2017-11-10 DIAGNOSIS — Z0000 Encounter for general adult medical examination without abnormal findings: Secondary | ICD-10-CM

## 2017-11-10 LAB — URINALYSIS, ROUTINE W REFLEX MICROSCOPIC
Bilirubin Urine: NEGATIVE
Hgb urine dipstick: NEGATIVE
Ketones, ur: NEGATIVE
Nitrite: POSITIVE — AB
RBC / HPF: NONE SEEN (ref 0–?)
Specific Gravity, Urine: 1.015 (ref 1.000–1.030)
Total Protein, Urine: NEGATIVE
Urine Glucose: NEGATIVE
Urobilinogen, UA: 0.2 (ref 0.0–1.0)
pH: 6 (ref 5.0–8.0)

## 2017-11-10 LAB — COMPREHENSIVE METABOLIC PANEL
ALT: 13 U/L (ref 0–35)
AST: 21 U/L (ref 0–37)
Albumin: 4 g/dL (ref 3.5–5.2)
Alkaline Phosphatase: 60 U/L (ref 39–117)
BUN: 17 mg/dL (ref 6–23)
CO2: 31 mEq/L (ref 19–32)
Calcium: 9.2 mg/dL (ref 8.4–10.5)
Chloride: 99 mEq/L (ref 96–112)
Creatinine, Ser: 0.7 mg/dL (ref 0.40–1.20)
GFR: 87.68 mL/min (ref >60.00)
Glucose, Bld: 91 mg/dL (ref 70–99)
Potassium: 4.1 mEq/L (ref 3.5–5.1)
Sodium: 137 mEq/L (ref 135–145)
Total Bilirubin: 0.6 mg/dL (ref 0.2–1.2)
Total Protein: 6.8 g/dL (ref 6.0–8.3)

## 2017-11-10 LAB — LIPID PANEL
Cholesterol: 156 mg/dL (ref 0–200)
HDL: 59.9 mg/dL (ref >39.00)
LDL Cholesterol: 83 mg/dL (ref 0–99)
NonHDL: 96.54
Total CHOL/HDL Ratio: 3
Triglycerides: 67 mg/dL (ref 0.0–149.0)
VLDL: 13.4 mg/dL (ref 0.0–40.0)

## 2017-11-10 LAB — CBC WITH DIFFERENTIAL/PLATELET
Basophils Absolute: 0 10*3/uL (ref 0.0–0.1)
Basophils Relative: 0.4 % (ref 0.0–3.0)
Eosinophils Absolute: 0.1 10*3/uL (ref 0.0–0.7)
Eosinophils Relative: 1.2 % (ref 0.0–5.0)
HCT: 39.7 % (ref 36.0–46.0)
Hemoglobin: 13.3 g/dL (ref 12.0–15.0)
Lymphocytes Relative: 33.5 % (ref 12.0–46.0)
Lymphs Abs: 1.7 10*3/uL (ref 0.7–4.0)
MCHC: 33.4 g/dL (ref 30.0–36.0)
MCV: 93.9 fl (ref 78.0–100.0)
Monocytes Absolute: 0.4 10*3/uL (ref 0.1–1.0)
Monocytes Relative: 8.6 % (ref 3.0–12.0)
Neutro Abs: 2.9 10*3/uL (ref 1.4–7.7)
Neutrophils Relative %: 56.3 % (ref 43.0–77.0)
Platelets: 214 10*3/uL (ref 150.0–400.0)
RBC: 4.23 Mil/uL (ref 3.87–5.11)
RDW: 14.2 % (ref 11.5–15.5)
WBC: 5.2 10*3/uL (ref 4.0–10.5)

## 2017-11-10 LAB — TSH: TSH: 1.11 u[IU]/mL (ref 0.35–4.50)

## 2017-11-10 LAB — VITAMIN D 25 HYDROXY (VIT D DEFICIENCY, FRACTURES): VITD: 47.58 ng/mL (ref 30.00–100.00)

## 2017-11-10 LAB — HEMOGLOBIN A1C: Hgb A1c MFr Bld: 5.6 % (ref 4.6–6.5)

## 2017-11-11 ENCOUNTER — Other Ambulatory Visit: Payer: PPO

## 2017-11-11 LAB — MICROALBUMIN / CREATININE URINE RATIO
Creatinine,U: 69.4 mg/dL
Microalb Creat Ratio: 1 mg/g (ref 0.0–30.0)
Microalb, Ur: <0.7 mg/dL (ref 0.0–1.9)

## 2017-11-12 ENCOUNTER — Other Ambulatory Visit: Payer: Self-pay | Admitting: Family

## 2017-11-12 DIAGNOSIS — I10 Essential (primary) hypertension: Secondary | ICD-10-CM

## 2017-11-13 ENCOUNTER — Encounter: Payer: Self-pay | Admitting: Internal Medicine

## 2017-11-14 ENCOUNTER — Ambulatory Visit (INDEPENDENT_AMBULATORY_CARE_PROVIDER_SITE_OTHER): Payer: PPO | Admitting: Internal Medicine

## 2017-11-14 ENCOUNTER — Encounter: Payer: Self-pay | Admitting: Internal Medicine

## 2017-11-14 VITALS — BP 124/76 | HR 73 | Temp 98.4°F | Resp 14 | Ht 61.5 in | Wt 160.6 lb

## 2017-11-14 DIAGNOSIS — M545 Low back pain, unspecified: Secondary | ICD-10-CM

## 2017-11-14 DIAGNOSIS — Z0000 Encounter for general adult medical examination without abnormal findings: Secondary | ICD-10-CM

## 2017-11-14 DIAGNOSIS — Z832 Family history of diseases of the blood and blood-forming organs and certain disorders involving the immune mechanism: Secondary | ICD-10-CM | POA: Diagnosis not present

## 2017-11-14 DIAGNOSIS — E669 Obesity, unspecified: Secondary | ICD-10-CM

## 2017-11-14 DIAGNOSIS — Z1231 Encounter for screening mammogram for malignant neoplasm of breast: Secondary | ICD-10-CM | POA: Diagnosis not present

## 2017-11-14 DIAGNOSIS — I10 Essential (primary) hypertension: Secondary | ICD-10-CM | POA: Diagnosis not present

## 2017-11-14 DIAGNOSIS — Z1239 Encounter for other screening for malignant neoplasm of breast: Secondary | ICD-10-CM

## 2017-11-14 MED ORDER — ZOSTER VAC RECOMB ADJUVANTED 50 MCG/0.5ML IM SUSR: 0.5000 mL | Freq: Once | INTRAMUSCULAR | 1 refills | Status: AC

## 2017-11-14 NOTE — Progress Notes (Signed)
Patient ID: Valerie Curry, female    DOB: 07-26-1946  Age: 71 y.o. MRN: [id number]  The patient is here for  FOLLOW UP AND management of other chronic and acute problems. 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     UP TO DATE ON MAMMOGRAM,  COLONOSCOPY AND DEXA SCAN    The risk factors are reflected in the social history.  SOCIAL DRINKER  QUIT SMOKING 2 YEARS AGO AFTER HER BACK SURGERY .  STARTED IN COLLEGE.    The roster of all physicians providing medical care to patient - is listed in the Snapshot section of the chart.  Activities of daily living:  The patient is 100% independent in all ADLs: dressing, toileting, feeding as well as independent mobility  Home safety : The patient has smoke detectors in the home. They wear seatbelts.  There are no firearms at home. There is no violence in the home.   There is no risks for hepatitis, STDs or HIV. There is no   history of blood transfusion. They have no travel history to infectious disease endemic areas of the world.  The patient has seen their dentist in the last six month. They have seen their eye doctor in the last year. They admit to slight hearing difficulty with regard to whispered voices and some television programs.  They have deferred audiologic testing in the last year.  They do not  have excessive sun exposure. Discussed the need for sun protection: hats, long sleeves and use of sunscreen if there is significant sun exposure.   Diet: the importance of a healthy diet is discussed. They do have a healthy diet.  The benefits of regular aerobic exercise were discussed. She walks 4 times per week ,  20 minutes.   Depression screen: there are no signs or vegative symptoms of depression- irritability, change in appetite, anhedonia, sadness/tearfullness.  Cognitive assessment: the patient manages all their financial and personal affairs and is actively  engaged. They could relate day,date,year and events; recalled 2/3 objects at 3 minutes; performed clock-face test normally.  The following portions of the patient's history were reviewed and updated as appropriate: allergies, current medications, past family history, past medical history,  past surgical history, past social history  and problem list.  Visual acuity was not assessed per patient preference since she has regular follow up with her ophthalmologist. Hearing and body mass index were assessed and reviewed.   During the course of the visit the patient was educated and counseled about appropriate screening and preventive services including : fall prevention , diabetes screening, nutrition counseling, colorectal cancer screening, and recommended immunizations.    CC: The primary encounter diagnosis was Acute bilateral low back pain without sciatica. Diagnoses of Obesity (BMI 30-39.9), Essential hypertension, Routine physical examination, Breast cancer screening, and Family history of clotting disorder were also pertinent to this visit.   Cc: obesity:  She has achieved a 50 LB WEIGHT LOSS OVER THE PAST YEAR by following the progrm endorsed by  [employer].  In order to become a life time member, she was told that she had to lose  33 MORE LBS (for BMI < 25).  She is opposed to this goal and was told that a letter from her physician exempting her from this goal would suffice as a waiver.  She is requesting that from me today.   She [facility] 3  [facility]  The other days she uses her FitBit to get in 10,000 steps.     Labs reviewed, discussed the  UA which noted bacteria (many) but only 3-6 WBCs per field  on Dec 13 but did not include a urine culture  .  She denies dysuria,  But has had  Persistent low back pain and frequency     She has a family history of DVT/VTE in multiple family members. But her own testign for Factor V Leiden mutation was reportedly  negative,  Her previous physician recommended daily aspirin ,  Whichshe ha taken for years.  She now notices the development of purpura with minimal trauma. Discussed stopping the daily aspirin in light of the absence of Tpe 2 DM and known CAD.      History Valerie Curry has a past medical history of Arthritis, Family history of malignant neoplasm of breast, Hypertension, Lump or mass in breast (2011/2012), Obesity, unspecified, Osteopenia, Personal history of tobacco use, presenting hazards to health, Positive PPD, and Systemic sclerosis ([place]).   She has a past surgical history that includes Tonsillectomy (1968); Colonoscopy (8/05); Bunionectomy (2010); Appendectomy (1961); Tubal ligation (1974); Breast biopsy (Right, Nov 2011); Breast biopsy (Left, Nov 2012); and Decompressive lumbar laminectomy level 2 (N/A, 04/30/2015).   Her family history includes Breast cancer (age of onset: 109) in her maternal aunt and paternal aunt; Cancer in her maternal aunt; Heart disease in her mother; Heart disease (age of onset: 64) in her father; Hypertension in her mother; Lung cancer in her father; Other in her brother and unknown relative; Stroke in her mother.She reports that she has quit smoking. Her smoking use included cigarettes. She has a 50.00 pack-year smoking history. She quit smokeless tobacco use about 2 years ago. She reports that she drinks alcohol. She reports that she does not use drugs.  Outpatient Medications Prior to Visit  Medication Sig Dispense Refill  . Ascorbic Acid (VITAMIN C) 1000 MG tablet Take 1,000 mg by mouth every morning.     . Calcium Carbonate-Vit D-Min 600-400 MG-UNIT TABS Take 1 tablet by mouth 2 (two) times daily.      . hydrochlorothiazide (HYDRODIURIL) 25 MG tablet Take 0.5 tablets (12.5 mg total) by mouth daily. 90 tablet 4  . aspirin 81 MG tablet Take 81 mg by mouth every morning.     . naproxen (NAPROSYN) 250 MG tablet Take 500 mg by mouth 2 (two) times daily as needed for mild pain.      No facility-administered medications prior to visit.     Review of Systems   Patient denies headache, fevers, malaise, unintentional weight loss, skin rash, eye pain, sinus congestion and sinus pain, sore throat, dysphagia,  hemoptysis , cough, dyspnea, wheezing, chest pain, palpitations, orthopnea, edema, abdominal pain, nausea, melena, diarrhea, constipation, flank pain, dysuria, hematuria, urinary  Frequency, nocturia, numbness, tingling, seizures,  Focal weakness, Loss of consciousness,  Tremor, insomnia, depression, anxiety, and suicidal ideation.      Objective:  BP 124/76 (BP Location: Left Arm, Patient Position: Sitting, Cuff Size: Normal)   Pulse 73   Temp 98.4 F (36.9 C) (Oral)   Resp 14   Ht 5' 1.5" (1.562 m)   Wt 160 lb 9.6 oz (72.8 kg)   SpO2 98%   BMI 29.85 kg/m   Physical Exam   General appearance: alert, cooperative and appears stated age Head: Normocephalic, without obvious abnormality, atraumatic Eyes: conjunctivae/corneas clear. PERRL, EOM's intact. Fundi  benign. Ears: normal TM's and external ear canals both ears Nose: Nares normal. Septum midline. Mucosa normal. No drainage or sinus tenderness. Throat: lips, mucosa, and tongue normal; teeth and gums normal Neck: no adenopathy, no carotid bruit, no JVD, supple, symmetrical, trachea midline and thyroid not enlarged, symmetric, no tenderness/mass/nodules Lungs: clear to auscultation bilaterally Breasts: normal appearance, no masses or tenderness Heart: regular rate and rhythm, S1, S2 normal, no murmur, click, rub or gallop Abdomen: soft, non-tender; bowel sounds normal; no masses,  no organomegaly Extremities: extremities normal, atraumatic, no cyanosis or edema Pulses: 2+ and symmetric Skin: Skin color, texture, turgor normal. No rashes or lesions Neurologic: Alert and oriented X 3, normal strength and tone. Normal symmetric reflexes. Normal coordination and gait.      Assessment & Plan:   Problem  List Items Addressed This Visit    Family history of clotting disorder    She was screened in the past and negative for Factor V Leiden mutation. She has no history of DVT, VTE,  CAD or diabetes.  She has been a nonsmoker for over 2 years.   Daily use of aspirin was discouraged today given the absence of any evidence that the benefit outweighs the risk of bleeding .      HTN (hypertension)    Well controlled on current regimen. Renal function stable, no changes today.  Lab Results  Component Value Date   CREATININE 0.70 11/10/2017   Lab Results  Component Value Date   NA 137 11/10/2017   K 4.1 11/10/2017   CL 99 11/10/2017   CO2 31 11/10/2017         Low back pain - Primary    She has a history of spinal stenosis treated surgically 2 years ago.  Recent UA was abnormal and will be repeated today along with a culture.       Relevant Orders   Urinalysis, Routine w reflex microscopic   Urine Culture   Obesity (BMI 30-39.9)    I have congratulated her in reduction of   BMI from 35 (wt of 210 lbs  in Nov 2017)  To < 30  and encouraged  Continued weight loss with goal of  145 lbs (for BMI of 27) over the next 6 months using a low glycemic index diet and regular exercise a minimum of 5 days per week.   Letter supporting this goal was written to [employer].        Routine physical examination    Annual comprehensive preventive exam was done as well as an evaluation and management of chronic conditions .  During the course of the visit the patient was educated and counseled about appropriate screening and preventive services including :  diabetes screening, lipid analysis with projected  10 year  risk for CAD , nutrition counseling, breast, cervical and colorectal cancer screening, and recommended immunizations.  Printed recommendations for health maintenance screenings was given       Other Visit Diagnoses    Breast cancer screening        A total of 40 minutes was spent with  patient more than half of which was spent in counseling patient on the above mentioned issues , reviewing and explaining recent labs and imaging studies done, and coordination of care.   I have discontinued Tor Netters. Bink's aspirin and naproxen. I am also having her start on Zoster Vaccine Adjuvanted. Additionally, I am having her maintain her vitamin C, Calcium Carbonate-Vit D-Min, and hydrochlorothiazide.  Meds  ordered this encounter  Medications  . Zoster Vaccine Adjuvanted [facility]) injection    Sig: Inject 0.5 mLs into the muscle once for 1 dose.    Dispense:  1 each    Refill:  1    Medications Discontinued During This Encounter  Medication Reason  . naproxen (NAPROSYN) 250 MG tablet Patient has not taken in last 30 days  . aspirin 81 MG tablet     Follow-up: No Follow-up on file.   Crecencio Mc, MD

## 2017-11-14 NOTE — Patient Instructions (Signed)
I applaud you on your weight loss   I recommend losing 15 more lbs over the next 6 months  (goal 145 lbs ) for  BMI of 27  Mammogram is due in February and will be ordered     Health Maintenance for Postmenopausal Women Menopause is a normal process in which your reproductive ability comes to an end. This process happens gradually over a span of months to years, usually between the ages of 63 and 57. Menopause is complete when you have missed 12 consecutive menstrual periods. It is important to talk with your health care provider about some of the most common conditions that affect postmenopausal women, such as heart disease, cancer, and bone loss (osteoporosis). Adopting a healthy lifestyle and getting preventive care can help to promote your health and wellness. Those actions can also lower your chances of developing some of these common conditions. What should I know about menopause? During menopause, you may experience a number of symptoms, such as:  Moderate-to-severe hot flashes.  Night sweats.  Decrease in sex drive.  Mood swings.  Headaches.  Tiredness.  Irritability.  Memory problems.  Insomnia.  Choosing to treat or not to treat menopausal changes is an individual decision that you make with your health care provider. What should I know about hormone replacement therapy and supplements? Hormone therapy products are effective for treating symptoms that are associated with menopause, such as hot flashes and night sweats. Hormone replacement carries certain risks, especially as you become older. If you are thinking about using estrogen or estrogen with progestin treatments, discuss the benefits and risks with your health care provider. What should I know about heart disease and stroke? Heart disease, heart attack, and stroke become more likely as you age. This may be due, in part, to the hormonal changes that your body experiences during menopause. These can affect how  your body processes dietary fats, triglycerides, and cholesterol. Heart attack and stroke are both medical emergencies. There are many things that you can do to help prevent heart disease and stroke:  Have your blood pressure checked at least every 1-2 years. High blood pressure causes heart disease and increases the risk of stroke.  If you are 35-72 years old, ask your health care provider if you should take aspirin to prevent a heart attack or a stroke.  Do not use any tobacco products, including cigarettes, chewing tobacco, or electronic cigarettes. If you need help quitting, ask your health care provider.  It is important to eat a healthy diet and maintain a healthy weight. ? Be sure to include plenty of vegetables, fruits, low-fat dairy products, and lean protein. ? Avoid eating foods that are high in solid fats, added sugars, or salt (sodium).  Get regular exercise. This is one of the most important things that you can do for your health. ? Try to exercise for at least 150 minutes each week. The type of exercise that you do should increase your heart rate and make you sweat. This is known as moderate-intensity exercise. ? Try to do strengthening exercises at least twice each week. Do these in addition to the moderate-intensity exercise.  Know your numbers.Ask your health care provider to check your cholesterol and your blood glucose. Continue to have your blood tested as directed by your health care provider.  What should I know about cancer screening? There are several types of cancer. Take the following steps to reduce your risk and to catch any cancer development as early as  possible. Breast Cancer  Practice breast self-awareness. ? This means understanding how your breasts normally appear and feel. ? It also means doing regular breast self-exams. Let your health care provider know about any changes, no matter how small.  If you are 55 or older, have a clinician do a breast exam  (clinical breast exam or CBE) every year. Depending on your age, family history, and medical history, it may be recommended that you also have a yearly breast X-ray (mammogram).  If you have a family history of breast cancer, talk with your health care provider about genetic screening.  If you are at high risk for breast cancer, talk with your health care provider about having an MRI and a mammogram every year.  Breast cancer (BRCA) gene test is recommended for women who have family members with BRCA-related cancers. Results of the assessment will determine the need for genetic counseling and BRCA1 and for BRCA2 testing. BRCA-related cancers include these types: ? Breast. This occurs in males or females. ? Ovarian. ? Tubal. This may also be called fallopian tube cancer. ? Cancer of the abdominal or pelvic lining (peritoneal cancer). ? Prostate. ? Pancreatic.  Cervical, Uterine, and Ovarian Cancer Your health care provider may recommend that you be screened regularly for cancer of the pelvic organs. These include your ovaries, uterus, and vagina. This screening involves a pelvic exam, which includes checking for microscopic changes to the surface of your cervix (Pap test).  For women ages 21-65, health care providers may recommend a pelvic exam and a Pap test every three years. For women ages 28-65, they may recommend the Pap test and pelvic exam, combined with testing for human papilloma virus (HPV), every five years. Some types of HPV increase your risk of cervical cancer. Testing for HPV may also be done on women of any age who have unclear Pap test results.  Other health care providers may not recommend any screening for nonpregnant women who are considered low risk for pelvic cancer and have no symptoms. Ask your health care provider if a screening pelvic exam is right for you.  If you have had past treatment for cervical cancer or a condition that could lead to cancer, you need Pap tests  and screening for cancer for at least 20 years after your treatment. If Pap tests have been discontinued for you, your risk factors (such as having a new sexual partner) need to be reassessed to determine if you should start having screenings again. Some women have medical problems that increase the chance of getting cervical cancer. In these cases, your health care provider may recommend that you have screening and Pap tests more often.  If you have a family history of uterine cancer or ovarian cancer, talk with your health care provider about genetic screening.  If you have vaginal bleeding after reaching menopause, tell your health care provider.  There are currently no reliable tests available to screen for ovarian cancer.  Lung Cancer Lung cancer screening is recommended for adults 10-39 years old who are at high risk for lung cancer because of a history of smoking. A yearly low-dose CT scan of the lungs is recommended if you:  Currently smoke.  Have a history of at least 30 pack-years of smoking and you currently smoke or have quit within the past 15 years. A pack-year is smoking an average of one pack of cigarettes per day for one year.  Yearly screening should:  Continue until it has been 15 years  since you quit.  Stop if you develop a health problem that would prevent you from having lung cancer treatment.  Colorectal Cancer  This type of cancer can be detected and can often be prevented.  Routine colorectal cancer screening usually begins at age 12 and continues through age 5.  If you have risk factors for colon cancer, your health care provider may recommend that you be screened at an earlier age.  If you have a family history of colorectal cancer, talk with your health care provider about genetic screening.  Your health care provider may also recommend using home test kits to check for hidden blood in your stool.  A small camera at the end of a tube can be used to  examine your colon directly (sigmoidoscopy or colonoscopy). This is done to check for the earliest forms of colorectal cancer.  Direct examination of the colon should be repeated every 5-10 years until age 71. However, if early forms of precancerous polyps or small growths are found or if you have a family history or genetic risk for colorectal cancer, you may need to be screened more often.  Skin Cancer  Check your skin from head to toe regularly.  Monitor any moles. Be sure to tell your health care provider: ? About any new moles or changes in moles, especially if there is a change in a mole's shape or color. ? If you have a mole that is larger than the size of a pencil eraser.  If any of your family members has a history of skin cancer, especially at a young age, talk with your health care provider about genetic screening.  Always use sunscreen. Apply sunscreen liberally and repeatedly throughout the day.  Whenever you are outside, protect yourself by wearing long sleeves, pants, a wide-brimmed hat, and sunglasses.  What should I know about osteoporosis? Osteoporosis is a condition in which bone destruction happens more quickly than new bone creation. After menopause, you may be at an increased risk for osteoporosis. To help prevent osteoporosis or the bone fractures that can happen because of osteoporosis, the following is recommended:  If you are 80-30 years old, get at least 1,000 mg of calcium and at least 600 mg of vitamin D per day.  If you are older than age 60 but younger than age 84, get at least 1,200 mg of calcium and at least 600 mg of vitamin D per day.  If you are older than age 8, get at least 1,200 mg of calcium and at least 800 mg of vitamin D per day.  Smoking and excessive alcohol intake increase the risk of osteoporosis. Eat foods that are rich in calcium and vitamin D, and do weight-bearing exercises several times each week as directed by your health care  provider. What should I know about how menopause affects my mental health? Depression may occur at any age, but it is more common as you become older. Common symptoms of depression include:  Low or sad mood.  Changes in sleep patterns.  Changes in appetite or eating patterns.  Feeling an overall lack of motivation or enjoyment of activities that you previously enjoyed.  Frequent crying spells.  Talk with your health care provider if you think that you are experiencing depression. What should I know about immunizations? It is important that you get and maintain your immunizations. These include:  Tetanus, diphtheria, and pertussis (Tdap) booster vaccine.  Influenza every year before the flu season begins.  Pneumonia vaccine.  Shingles vaccine.  Your health care provider may also recommend other immunizations. This information is not intended to replace advice given to you by your health care provider. Make sure you discuss any questions you have with your health care provider. Document Released: 01/07/2006 Document Revised: 06/04/2016 Document Reviewed: 08/19/2015 Elsevier Interactive Patient Education  2018 [employer].

## 2017-11-15 DIAGNOSIS — Z832 Family history of diseases of the blood and blood-forming organs and certain disorders involving the immune mechanism: Secondary | ICD-10-CM | POA: Insufficient documentation

## 2017-11-15 LAB — URINALYSIS, ROUTINE W REFLEX MICROSCOPIC
Bilirubin Urine: NEGATIVE
Hgb urine dipstick: NEGATIVE
Ketones, ur: NEGATIVE
Leukocytes, UA: NEGATIVE
Nitrite: POSITIVE — AB
RBC / HPF: NONE SEEN (ref 0–?)
Specific Gravity, Urine: 1.02 (ref 1.000–1.030)
Total Protein, Urine: NEGATIVE
Urine Glucose: NEGATIVE
Urobilinogen, UA: 0.2 (ref 0.0–1.0)
pH: 5.5 (ref 5.0–8.0)

## 2017-11-15 NOTE — Assessment & Plan Note (Signed)
Annual comprehensive preventive exam was done as well as an evaluation and management of chronic conditions .  During the course of the visit the patient was educated and counseled about appropriate screening and preventive services including :  diabetes screening, lipid analysis with projected  10 year  risk for CAD , nutrition counseling, breast, cervical and colorectal cancer screening, and recommended immunizations.  Printed recommendations for health maintenance screenings was given 

## 2017-11-15 NOTE — Assessment & Plan Note (Signed)
Well controlled on current regimen. Renal function stable, no changes today.  Lab Results  Component Value Date   CREATININE 0.70 11/10/2017   Lab Results  Component Value Date   NA 137 11/10/2017   K 4.1 11/10/2017   CL 99 11/10/2017   CO2 31 11/10/2017

## 2017-11-15 NOTE — Assessment & Plan Note (Signed)
She was screened in the past and negative for Factor V Leiden mutation. She has no history of DVT, VTE,  CAD or diabetes.  She has been a nonsmoker for over 2 years.   Daily use of aspirin was discouraged today given the absence of any evidence that the benefit outweighs the risk of bleeding .

## 2017-11-15 NOTE — Assessment & Plan Note (Addendum)
I have congratulated her in reduction of   BMI from 35 (wt of 210 lbs  in Nov 2017)  To < 30  and encouraged  Continued weight loss with goal of  145 lbs (for BMI of 27) over the next 6 months using a low glycemic index diet and regular exercise a minimum of 5 days per week.   Letter supporting this goal was written to [employer].

## 2017-11-15 NOTE — Assessment & Plan Note (Signed)
She has a history of spinal stenosis treated surgically 2 years ago.  Recent UA was abnormal and will be repeated today along with a culture.

## 2017-11-16 ENCOUNTER — Telehealth: Payer: Self-pay | Admitting: Family

## 2017-11-16 ENCOUNTER — Encounter: Payer: Self-pay | Admitting: Internal Medicine

## 2017-11-16 LAB — URINE CULTURE
MICRO NUMBER:: 81415014
SPECIMEN QUALITY:: ADEQUATE

## 2017-11-16 NOTE — Telephone Encounter (Signed)
Copied from [place] [id number] [id number]. Topic: Quick Communication - See Telephone Encounter >> Nov 16, 2017  1:37 PM Vernona Rieger wrote: CRM for notification. See Telephone encounter for:   11/16/17. Pt is requesting her lab results from 12/17. Please call back [phone]

## 2017-11-17 ENCOUNTER — Other Ambulatory Visit: Payer: Self-pay | Admitting: Internal Medicine

## 2017-11-17 MED ORDER — CIPROFLOXACIN HCL 250 MG PO TABS: 250.0000 mg | ORAL_TABLET | Freq: Two times a day (BID) | ORAL | 0 refills | Status: DC

## 2017-11-18 NOTE — Telephone Encounter (Signed)
Spoke with pt and she stated that she finally saw them on her mychart.

## 2017-12-21 ENCOUNTER — Encounter: Payer: Self-pay | Admitting: Internal Medicine

## 2017-12-22 ENCOUNTER — Encounter: Payer: Self-pay | Admitting: Internal Medicine

## 2017-12-23 ENCOUNTER — Other Ambulatory Visit: Payer: Self-pay | Admitting: Family

## 2017-12-23 DIAGNOSIS — I10 Essential (primary) hypertension: Secondary | ICD-10-CM

## 2017-12-27 NOTE — Telephone Encounter (Signed)
This was faxed on 12/23/2017

## 2017-12-27 NOTE — Telephone Encounter (Signed)
rx sent on 12/23/2017

## 2018-01-11 ENCOUNTER — Telehealth: Payer: Self-pay | Admitting: Family

## 2018-01-11 NOTE — Telephone Encounter (Signed)
Declined 2019 AWV

## 2018-01-19 ENCOUNTER — Ambulatory Visit
Admission: RE | Admit: 2018-01-19 | Discharge: 2018-01-19 | Disposition: A | Discharge status: Home / Self Care | Payer: PPO | Source: Ambulatory Visit | Attending: Internal Medicine | Admitting: Internal Medicine

## 2018-01-19 DIAGNOSIS — Z1239 Encounter for other screening for malignant neoplasm of breast: Secondary | ICD-10-CM

## 2018-01-19 DIAGNOSIS — Z1231 Encounter for screening mammogram for malignant neoplasm of breast: Secondary | ICD-10-CM | POA: Diagnosis not present

## 2018-08-28 ENCOUNTER — Ambulatory Visit (INDEPENDENT_AMBULATORY_CARE_PROVIDER_SITE_OTHER): Payer: PPO

## 2018-08-28 DIAGNOSIS — Z23 Encounter for immunization: Secondary | ICD-10-CM

## 2018-09-04 DIAGNOSIS — D2271 Melanocytic nevi of right lower limb, including hip: Secondary | ICD-10-CM | POA: Diagnosis not present

## 2018-09-04 DIAGNOSIS — D2261 Melanocytic nevi of right upper limb, including shoulder: Secondary | ICD-10-CM | POA: Diagnosis not present

## 2018-09-04 DIAGNOSIS — D2262 Melanocytic nevi of left upper limb, including shoulder: Secondary | ICD-10-CM | POA: Diagnosis not present

## 2018-09-04 DIAGNOSIS — L820 Inflamed seborrheic keratosis: Secondary | ICD-10-CM | POA: Diagnosis not present

## 2018-09-04 DIAGNOSIS — D2272 Melanocytic nevi of left lower limb, including hip: Secondary | ICD-10-CM | POA: Diagnosis not present

## 2018-09-04 DIAGNOSIS — D225 Melanocytic nevi of trunk: Secondary | ICD-10-CM | POA: Diagnosis not present

## 2018-09-04 DIAGNOSIS — L298 Other pruritus: Secondary | ICD-10-CM | POA: Diagnosis not present

## 2018-09-04 DIAGNOSIS — L538 Other specified erythematous conditions: Secondary | ICD-10-CM | POA: Diagnosis not present

## 2018-11-09 ENCOUNTER — Encounter: Payer: Self-pay | Admitting: Family

## 2018-11-10 ENCOUNTER — Other Ambulatory Visit: Payer: Self-pay | Admitting: Family

## 2018-11-10 DIAGNOSIS — Z0000 Encounter for general adult medical examination without abnormal findings: Secondary | ICD-10-CM

## 2018-11-14 ENCOUNTER — Other Ambulatory Visit (INDEPENDENT_AMBULATORY_CARE_PROVIDER_SITE_OTHER): Payer: PPO

## 2018-11-14 DIAGNOSIS — Z0000 Encounter for general adult medical examination without abnormal findings: Secondary | ICD-10-CM | POA: Diagnosis not present

## 2018-11-14 LAB — CBC WITH DIFFERENTIAL/PLATELET
Basophils Absolute: 0 10*3/uL (ref 0.0–0.1)
Basophils Relative: 0.4 % (ref 0.0–3.0)
Eosinophils Absolute: 0.1 10*3/uL (ref 0.0–0.7)
Eosinophils Relative: 1.7 % (ref 0.0–5.0)
HCT: 40.2 % (ref 36.0–46.0)
Hemoglobin: 13.6 g/dL (ref 12.0–15.0)
Lymphocytes Relative: 28.6 % (ref 12.0–46.0)
Lymphs Abs: 1.1 10*3/uL (ref 0.7–4.0)
MCHC: 33.8 g/dL (ref 30.0–36.0)
MCV: 93.5 fl (ref 78.0–100.0)
Monocytes Absolute: 0.4 10*3/uL (ref 0.1–1.0)
Monocytes Relative: 9.7 % (ref 3.0–12.0)
Neutro Abs: 2.2 10*3/uL (ref 1.4–7.7)
Neutrophils Relative %: 59.6 % (ref 43.0–77.0)
Platelets: 194 10*3/uL (ref 150.0–400.0)
RBC: 4.3 Mil/uL (ref 3.87–5.11)
RDW: 13.9 % (ref 11.5–15.5)
WBC: 3.8 10*3/uL — ABNORMAL LOW (ref 4.0–10.5)

## 2018-11-14 LAB — COMPREHENSIVE METABOLIC PANEL
ALT: 12 U/L (ref 0–35)
AST: 19 U/L (ref 0–37)
Albumin: 4 g/dL (ref 3.5–5.2)
Alkaline Phosphatase: 60 U/L (ref 39–117)
BUN: 25 mg/dL — ABNORMAL HIGH (ref 6–23)
CO2: 31 mEq/L (ref 19–32)
Calcium: 9.1 mg/dL (ref 8.4–10.5)
Chloride: 104 mEq/L (ref 96–112)
Creatinine, Ser: 0.71 mg/dL (ref 0.40–1.20)
GFR: 86.01 mL/min (ref >60.00)
Glucose, Bld: 89 mg/dL (ref 70–99)
Potassium: 4 mEq/L (ref 3.5–5.1)
Sodium: 141 mEq/L (ref 135–145)
Total Bilirubin: 0.5 mg/dL (ref 0.2–1.2)
Total Protein: 6.4 g/dL (ref 6.0–8.3)

## 2018-11-14 LAB — LIPID PANEL
Cholesterol: 160 mg/dL (ref 0–200)
HDL: 66.6 mg/dL (ref >39.00)
LDL Cholesterol: 85 mg/dL (ref 0–99)
NonHDL: 93.48
Total CHOL/HDL Ratio: 2
Triglycerides: 41 mg/dL (ref 0.0–149.0)
VLDL: 8.2 mg/dL (ref 0.0–40.0)

## 2018-11-14 LAB — TSH: TSH: 0.82 u[IU]/mL (ref 0.35–4.50)

## 2018-11-14 LAB — HEMOGLOBIN A1C: Hgb A1c MFr Bld: 5.5 % (ref 4.6–6.5)

## 2018-11-14 LAB — VITAMIN D 25 HYDROXY (VIT D DEFICIENCY, FRACTURES): VITD: 31.03 ng/mL (ref 30.00–100.00)

## 2018-11-16 ENCOUNTER — Encounter: Payer: PPO | Admitting: Family

## 2018-11-17 ENCOUNTER — Encounter: Payer: Self-pay | Admitting: Family

## 2018-11-17 ENCOUNTER — Ambulatory Visit (INDEPENDENT_AMBULATORY_CARE_PROVIDER_SITE_OTHER): Payer: PPO | Admitting: Family

## 2018-11-17 VITALS — BP 122/80 | HR 71 | Temp 98.4°F | Ht 61.5 in | Wt 151.8 lb

## 2018-11-17 DIAGNOSIS — E785 Hyperlipidemia, unspecified: Secondary | ICD-10-CM

## 2018-11-17 DIAGNOSIS — N814 Uterovaginal prolapse, unspecified: Secondary | ICD-10-CM

## 2018-11-17 DIAGNOSIS — Z122 Encounter for screening for malignant neoplasm of respiratory organs: Secondary | ICD-10-CM

## 2018-11-17 DIAGNOSIS — I10 Essential (primary) hypertension: Secondary | ICD-10-CM

## 2018-11-17 DIAGNOSIS — N813 Complete uterovaginal prolapse: Secondary | ICD-10-CM | POA: Insufficient documentation

## 2018-11-17 DIAGNOSIS — Z13820 Encounter for screening for osteoporosis: Secondary | ICD-10-CM | POA: Diagnosis not present

## 2018-11-17 DIAGNOSIS — R197 Diarrhea, unspecified: Secondary | ICD-10-CM | POA: Diagnosis not present

## 2018-11-17 DIAGNOSIS — Z0000 Encounter for general adult medical examination without abnormal findings: Secondary | ICD-10-CM

## 2018-11-17 HISTORY — DX: Hyperlipidemia, unspecified: E78.5

## 2018-11-17 HISTORY — DX: Uterovaginal prolapse, unspecified: N81.4

## 2018-11-17 LAB — CBC WITH DIFFERENTIAL/PLATELET
Basophils Absolute: 0 10*3/uL (ref 0.0–0.1)
Basophils Relative: 0.5 % (ref 0.0–3.0)
Eosinophils Absolute: 0.1 10*3/uL (ref 0.0–0.7)
Eosinophils Relative: 1.9 % (ref 0.0–5.0)
HCT: 40.7 % (ref 36.0–46.0)
Hemoglobin: 13.7 g/dL (ref 12.0–15.0)
Lymphocytes Relative: 27.3 % (ref 12.0–46.0)
Lymphs Abs: 1.6 10*3/uL (ref 0.7–4.0)
MCHC: 33.7 g/dL (ref 30.0–36.0)
MCV: 93.2 fl (ref 78.0–100.0)
Monocytes Absolute: 0.5 10*3/uL (ref 0.1–1.0)
Monocytes Relative: 8.5 % (ref 3.0–12.0)
Neutro Abs: 3.5 10*3/uL (ref 1.4–7.7)
Neutrophils Relative %: 61.8 % (ref 43.0–77.0)
Platelets: 206 10*3/uL (ref 150.0–400.0)
RBC: 4.37 Mil/uL (ref 3.87–5.11)
RDW: 13.7 % (ref 11.5–15.5)
WBC: 5.7 10*3/uL (ref 4.0–10.5)

## 2018-11-17 NOTE — Assessment & Plan Note (Signed)
Chronic. However did have recent antibiotics.  Pending GI pathogen panel, celiac, guaiac testing.  Advised patient that if these labs are unrevealing, I would like her to see GI.  She verbalized understanding.

## 2018-11-17 NOTE — Patient Instructions (Addendum)
Repeat cbc in a few weeks.  He may call to schedule this Pending labs, stool.  If diarrhea persist labs are unrevealing, please make follow-up to discuss further evaluation.  Please also consider cluster medication in the future.  Today we discussed referrals, orders. GYN, CT chest lung cancer screening, DEXA   I have placed these orders in the system for you.  Please be sure to give [place] a call if you have not heard from our office regarding this. We should hear from [place] within ONE week with information regarding your appointment. If not, please let me know immediately.

## 2018-11-17 NOTE — Assessment & Plan Note (Signed)
Clinical breast exam performed today.  Deferred pelvic exam as referring patient to OB/GYN for uterine prolapse.  DEXA is overdue, this is been ordered and patient understands to schedule.  Mammogram up-to-date.  Patient meets criteria for CT chest lung cancer screening, this has also been ordered today.

## 2018-11-17 NOTE — Assessment & Plan Note (Signed)
At goal, continue current regimen 

## 2018-11-17 NOTE — Progress Notes (Signed)
Subjective:    Patient ID: Valerie Curry, female    DOB: 02-09-1946, 72 y.o.   MRN: [id number]  CC: Valerie Curry is a 72 y.o. female who presents today for physical exam.    HPI: HTN- compliant with medication. No cp.   H/o prolapsed uterus for years. Has seen GYN in the past. can feel prolapse through vagina. No hysterectomy. Occasional UTIs - once per year.  Some trouble urinating. No hematuria.   Using ASA daily  Complains of 'excessive gas' and bowel movements are either loose or diarrhea, worse over last year.   Working on weight loss with [employer]. Hasnt noticed if lactose, gluten contributory. No distention. No blood in stool, stool incontinence. Gas x with some relief.    HLD-no medication. exercises regularly.          Colorectal Cancer Screening: UTD [place] 2015 Breast Cancer Screening: Mammogram UTD Cervical Cancer Screening: Last pap 2013 . No vaginal bleeding.  Bone Health screening/DEXA for 65+: Due, was treated with Boniva in the past.    Lung Cancer Screening: Meets       Tetanus - due       Labs: Screening labs done prior, reviewed in office today. Exercise: Gets regular exercise.  Alcohol use: Occasional. Smoking/tobacco use: former smoker.  Follows regularly with dermatology.  HISTORY:  Past Medical History:  Diagnosis Date  . Arthritis   . Family history of malignant neoplasm of breast    Maternal/paternal aunts and cousin  . Hypertension   . Lump or mass in breast 2011/2012   benign  . Obesity, unspecified   . Osteopenia    took Boniva 1 1/2 years in past-doesn't want to restart it  . Personal history of tobacco use, presenting hazards to health   . Positive PPD    as a child. xray was normal.  . Systemic sclerosis [facility])     Past Surgical History:  Procedure Laterality Date  . APPENDECTOMY  1961  . BREAST BIOPSY Right Nov 2011   benign  . BREAST BIOPSY Left Nov 2012   dilated ducts-benign  . BUNIONECTOMY  2010  .  COLONOSCOPY  8/05   Dr. Vira Agar  . DECOMPRESSIVE LUMBAR LAMINECTOMY LEVEL 2 N/A 04/30/2015   Procedure: CENTRAL DECOMPRESSIVE LUMBER LAMINECTOMY WITH POSSIBLE FORAMINOTOMIES L3-L4,L4-L5;  Surgeon: Latanya Maudlin, MD;  Location: [facility];  Service: Orthopedics;  Laterality: N/A;  . TONSILLECTOMY  1968  . TUBAL LIGATION  1974   Family History  Problem Relation Age of Onset  . Lung cancer Father   . Heart disease Father 69       Cardiac Arrest  . Heart disease Mother   . Hypertension Mother   . Stroke Mother   . Other Brother        blood clots  . Other Other        hypercoag-family history  . Cancer Maternal Aunt        Breast cancer  . Breast cancer Maternal Aunt 66  . Breast cancer Paternal Aunt 63      ALLERGIES: Patient has no known allergies.  Current Outpatient Medications on File Prior to Visit  Medication Sig Dispense Refill  . Ascorbic Acid (VITAMIN C) 1000 MG tablet Take 1,000 mg by mouth every morning.     . Calcium Carbonate-Vit D-Min 600-400 MG-UNIT TABS Take 1 tablet by mouth 2 (two) times daily.      . hydrochlorothiazide (HYDRODIURIL) 25 MG tablet TAKE 0.5 TABLETS (12.5 MG  TOTAL) BY MOUTH DAILY. 90 tablet 3   No current facility-administered medications on file prior to visit.     Social History   Tobacco Use  . Smoking status: Former Smoker    Packs/day: 1.00    Years: 50.00    Pack years: 50.00    Types: Cigarettes  . Smokeless tobacco: Former Systems developer    Quit date: 04/30/2015  Substance Use Topics  . Alcohol use: Yes    Alcohol/week: 0.0 standard drinks    Comment: occasional glassof wine   . Drug use: No    Review of Systems  Constitutional: Negative for chills, fever and unexpected weight change.  HENT: Negative for congestion.   Respiratory: Negative for cough.   Cardiovascular: Negative for chest pain, palpitations and leg swelling.  Gastrointestinal: Positive for diarrhea. Negative for abdominal distention, abdominal pain, blood in stool,  constipation, nausea and vomiting.  Genitourinary: Positive for difficulty urinating. Negative for decreased urine volume, frequency, urgency, vaginal bleeding and vaginal discharge.  Musculoskeletal: Negative for arthralgias and myalgias.  Skin: Negative for rash.  Neurological: Negative for headaches.  Hematological: Negative for adenopathy.  Psychiatric/Behavioral: Negative for confusion.      Objective:    BP 122/80 (BP Location: Left Arm, Patient Position: Sitting, Cuff Size: Normal)   Pulse 71   Temp 98.4 F (36.9 C)   Ht 5' 1.5" (1.562 m)   Wt 151 lb 12.8 oz (68.9 kg)   SpO2 98%   BMI 28.22 kg/m   BP Readings from Last 3 Encounters:  11/17/18 122/80  11/14/17 124/76  03/03/17 120/78   Wt Readings from Last 3 Encounters:  11/17/18 151 lb 12.8 oz (68.9 kg)  11/14/17 160 lb 9.6 oz (72.8 kg)  03/03/17 194 lb 1.9 oz (88.1 kg)    Physical Exam Vitals signs reviewed.  Constitutional:      Appearance: Normal appearance. She is well-developed.  Eyes:     Conjunctiva/sclera: Conjunctivae normal.  Neck:     Thyroid: No thyroid mass or thyromegaly.  Cardiovascular:     Rate and Rhythm: Normal rate and regular rhythm.     Pulses: Normal pulses.     Heart sounds: Normal heart sounds.  Pulmonary:     Effort: Pulmonary effort is normal.     Breath sounds: Normal breath sounds. No wheezing, rhonchi or rales.  Chest:     Breasts: Breasts are symmetrical.        Right: No inverted nipple, mass, nipple discharge, skin change or tenderness.        Left: No inverted nipple, mass, nipple discharge, skin change or tenderness.  Abdominal:     General: Bowel sounds are normal. There is no distension.     Palpations: Abdomen is soft. Abdomen is not rigid. There is no fluid wave or mass.     Tenderness: There is no abdominal tenderness. There is no guarding or rebound.  Lymphadenopathy:     Head:     Right side of head: No submental, submandibular, tonsillar, preauricular,  posterior auricular or occipital adenopathy.     Left side of head: No submental, submandibular, tonsillar, preauricular, posterior auricular or occipital adenopathy.     Cervical: No cervical adenopathy.     Right cervical: No superficial, deep or posterior cervical adenopathy.    Left cervical: No superficial, deep or posterior cervical adenopathy.  Skin:    General: Skin is warm and dry.  Neurological:     Mental Status: She is alert.  Psychiatric:  Speech: Speech normal.        Behavior: Behavior normal.        Thought Content: Thought content normal.        Assessment & Plan:   Problem List Items Addressed This Visit      Cardiovascular and Mediastinum   HTN (hypertension)    At goal, continue current regimen        Genitourinary   Uterine prolapse    History of.  Referral to OB/GYN.  Will follow      Relevant Orders   Ambulatory referral to Obstetrics / Gynecology     Other   Routine physical examination - Primary    Clinical breast exam performed today.  Deferred pelvic exam as referring patient to OB/GYN for uterine prolapse.  DEXA is overdue, this is been ordered and patient understands to schedule.  Mammogram up-to-date.  Patient meets criteria for CT chest lung cancer screening, this has also been ordered today.      Encounter for screening for malignant neoplasm of respiratory organs   Relevant Orders   CT CHEST LUNG CANCER SCREENING LOW DOSE WO CONTRAST   HLD (hyperlipidemia)    ASCVD High risk 11.  Advised statin therapy.  Patient politely declines at this time.  She will consider and let me know if she changes her mind.      Diarrhea    Chronic. However did have recent antibiotics.  Pending GI pathogen panel, celiac, guaiac testing.  Advised patient that if these labs are unrevealing, I would like her to see GI.  She verbalized understanding.      Relevant Orders   Fecal occult blood, imunochemical   Gastrointestinal Pathogen Panel PCR   CBC  with Differential/Platelet   Celiac Disease Ab Screen w/Rfx    Other Visit Diagnoses    Screening for osteoporosis       Relevant Orders   DG Bone Density       I have discontinued Tor Netters. Morgenthaler's ciprofloxacin. I am also having her maintain her vitamin C, Calcium Carbonate-Vit D-Min, and hydrochlorothiazide.   No orders of the defined types were placed in this encounter.   Return precautions given.   Risks, benefits, and alternatives of the medications and treatment plan prescribed today were discussed, and patient expressed understanding.   Education regarding symptom management and diagnosis given to patient on AVS.   Continue to follow with Burnard Hawthorne, FNP for routine health maintenance.   Valerie Curry and I agreed with plan.   Mable Paris, FNP

## 2018-11-17 NOTE — Assessment & Plan Note (Signed)
ASCVD High risk 11.  Advised statin therapy.  Patient politely declines at this time.  She will consider and let me know if she changes her mind.

## 2018-11-17 NOTE — Assessment & Plan Note (Signed)
History of.  Referral to OB/GYN.  Will follow

## 2018-11-20 ENCOUNTER — Other Ambulatory Visit: Payer: PPO

## 2018-11-20 ENCOUNTER — Telehealth: Payer: Self-pay | Admitting: *Deleted

## 2018-11-20 DIAGNOSIS — Z87891 Personal history of nicotine dependence: Secondary | ICD-10-CM

## 2018-11-20 DIAGNOSIS — R197 Diarrhea, unspecified: Secondary | ICD-10-CM | POA: Diagnosis not present

## 2018-11-20 DIAGNOSIS — Z122 Encounter for screening for malignant neoplasm of respiratory organs: Secondary | ICD-10-CM

## 2018-11-20 LAB — CELIAC DISEASE AB SCREEN W/RFX
Antigliadin Abs, IgA: 4 units (ref 0–19)
IgA/Immunoglobulin A, Serum: 245 mg/dL (ref 64–422)
Transglutaminase IgA: <2 U/mL (ref 0–3)

## 2018-11-20 NOTE — Addendum Note (Signed)
Addended by: Leeanne Rio on: 11/20/2018 09:52 AM   Modules accepted: Orders

## 2018-11-20 NOTE — Telephone Encounter (Signed)
Received referral for initial lung cancer screening scan. Contacted patient and obtained smoking history,(former, quit 2016, 50 pack year) as well as answering questions related to screening process. Patient denies signs of lung cancer such as weight loss or hemoptysis. Patient denies comorbidity that would prevent curative treatment if lung cancer were found. Patient is scheduled for shared decision making visit and CT scan on 12/07/18 at 145pm.

## 2018-11-21 ENCOUNTER — Other Ambulatory Visit (INDEPENDENT_AMBULATORY_CARE_PROVIDER_SITE_OTHER): Payer: PPO

## 2018-11-21 DIAGNOSIS — R197 Diarrhea, unspecified: Secondary | ICD-10-CM | POA: Diagnosis not present

## 2018-11-21 LAB — FECAL OCCULT BLOOD, IMMUNOCHEMICAL: Fecal Occult Bld: NEGATIVE

## 2018-11-27 ENCOUNTER — Telehealth: Payer: Self-pay | Admitting: *Deleted

## 2018-11-27 ENCOUNTER — Other Ambulatory Visit: Payer: Self-pay | Admitting: Family

## 2018-11-27 DIAGNOSIS — R197 Diarrhea, unspecified: Secondary | ICD-10-CM

## 2018-11-27 NOTE — Telephone Encounter (Signed)
Copied from [place] [phone]. Topic: Conservator, museum/gallery Patient (Clinic Use ONLY) >> Nov 27, 2018  3:26 PM Neta Ehlers, RMA wrote: Reason for CRM:  FNP Mable Paris placed referral to gastroenterology for diarrhea for the Pt, also a GYN appointment Please give [place] a call in the next 7 days if she is not heard from [place] regarding this. >> Nov 27, 2018  3:39 PM Sheran Luz wrote: Patient would like to know if gastro referral can be done through [facility] with a female provider, if possible. Please advise.

## 2018-11-27 NOTE — Progress Notes (Signed)
Ref

## 2018-11-28 ENCOUNTER — Other Ambulatory Visit: Payer: Self-pay | Admitting: Family

## 2018-11-28 DIAGNOSIS — Z1231 Encounter for screening mammogram for malignant neoplasm of breast: Secondary | ICD-10-CM

## 2018-11-29 DIAGNOSIS — I2510 Atherosclerotic heart disease of native coronary artery without angina pectoris: Secondary | ICD-10-CM | POA: Diagnosis present

## 2018-11-29 HISTORY — DX: Atherosclerotic heart disease of native coronary artery without angina pectoris: I25.10

## 2018-12-04 ENCOUNTER — Telehealth: Payer: Self-pay | Admitting: *Deleted

## 2018-12-04 NOTE — Telephone Encounter (Signed)
Called pt to remind her of her appt for ldct screening on 12-07-2018 at 51,  Spoke with husband [place] and he voiced understanding and agreement with appointment

## 2018-12-04 NOTE — Telephone Encounter (Signed)
Valerie Curry What is status of GYN and GI referral? See request for [facility] with a female provider,

## 2018-12-05 ENCOUNTER — Encounter: Payer: Self-pay | Admitting: Family

## 2018-12-05 LAB — GASTROINTESTINAL PATHOGEN PANEL PCR
C. difficile Tox A/B, PCR: UNDETERMINED — AB
Campylobacter, PCR: UNDETERMINED — AB
Cryptosporidium, PCR: UNDETERMINED — AB
E coli (ETEC) LT/ST PCR: UNDETERMINED — AB
E coli (STEC) stx1/stx2, PCR: UNDETERMINED — AB
E coli 0157, PCR: UNDETERMINED — AB
Giardia lamblia, PCR: UNDETERMINED — AB
Norovirus, PCR: UNDETERMINED — AB
Rotavirus A, PCR: UNDETERMINED — AB
Salmonella, PCR: UNDETERMINED — AB
Shigella, PCR: UNDETERMINED — AB

## 2018-12-06 ENCOUNTER — Other Ambulatory Visit: Payer: Self-pay | Admitting: Family

## 2018-12-06 ENCOUNTER — Encounter: Payer: Self-pay | Admitting: *Deleted

## 2018-12-06 DIAGNOSIS — R197 Diarrhea, unspecified: Secondary | ICD-10-CM

## 2018-12-06 NOTE — Telephone Encounter (Signed)
This encounter was created in error - please disregard.

## 2018-12-07 ENCOUNTER — Ambulatory Visit
Admission: RE | Admit: 2018-12-07 | Discharge: 2018-12-07 | Disposition: A | Discharge status: Home / Self Care | Payer: PPO | Source: Ambulatory Visit | Attending: Nurse Practitioner | Admitting: Nurse Practitioner

## 2018-12-07 ENCOUNTER — Encounter: Payer: Self-pay | Admitting: Oncology

## 2018-12-07 ENCOUNTER — Inpatient Hospital Stay: Payer: PPO | Attending: Nurse Practitioner | Admitting: Oncology

## 2018-12-07 ENCOUNTER — Other Ambulatory Visit: Payer: PPO

## 2018-12-07 ENCOUNTER — Telehealth: Payer: Self-pay | Admitting: Radiology

## 2018-12-07 ENCOUNTER — Other Ambulatory Visit: Payer: Self-pay

## 2018-12-07 DIAGNOSIS — Z87891 Personal history of nicotine dependence: Secondary | ICD-10-CM | POA: Insufficient documentation

## 2018-12-07 DIAGNOSIS — I2510 Atherosclerotic heart disease of native coronary artery without angina pectoris: Secondary | ICD-10-CM

## 2018-12-07 DIAGNOSIS — Z122 Encounter for screening for malignant neoplasm of respiratory organs: Secondary | ICD-10-CM

## 2018-12-07 DIAGNOSIS — R197 Diarrhea, unspecified: Secondary | ICD-10-CM

## 2018-12-07 HISTORY — DX: Atherosclerotic heart disease of native coronary artery without angina pectoris: I25.10

## 2018-12-07 NOTE — Telephone Encounter (Signed)
I spoke with patient to ask if she would be willing to come get the correct cup for stool sample. Patient was very angry & stated that she was given no directions that's why she put sample in her own cup. I repeatedly apologized to patient & she said that she was would let you know how displeased she was. Patient abruptly hung up the phone.

## 2018-12-07 NOTE — Telephone Encounter (Signed)
Pt's husband came in to pick up the correct cup for stool sample.Pt's husband was in a hurry to take pt to the hospital for x-ray. Told him I would have it ready when he came. Could you please get what is needed and bring up front so it is ready when pt's husband walks in.   Thanks

## 2018-12-07 NOTE — Progress Notes (Signed)
In accordance with CMS guidelines, patient has met eligibility criteria including age, absence of signs or symptoms of lung cancer.  Social History   Tobacco Use  . Smoking status: Former Smoker    Packs/day: 1.00    Years: 50.00    Pack years: 50.00    Types: Cigarettes    Last attempt to quit: 11/29/2014    Years since quitting: 4.0  . Smokeless tobacco: Former Systems developer    Quit date: 04/30/2015  Substance Use Topics  . Alcohol use: Yes    Alcohol/week: 0.0 standard drinks    Comment: occasional glassof wine   . Drug use: No     A shared decision-making session was conducted prior to the performance of CT scan. This includes one or more decision aids, includes benefits and harms of screening, follow-up diagnostic testing, over-diagnosis, false positive rate, and total radiation exposure.  Counseling on the importance of adherence to annual lung cancer LDCT screening, impact of co-morbidities, and ability or willingness to undergo diagnosis and treatment is imperative for compliance of the program.  Counseling on the importance of continued smoking cessation for former smokers; the importance of smoking cessation for current smokers, and information about tobacco cessation interventions have been given to patient including [place] and [employer] quit [place] programs.  Written order for lung cancer screening with LDCT has been given to the patient and any and all questions have been answered to the best of my abilities.   Yearly follow up will be coordinated by Burgess Estelle, Thoracic Navigator.  Faythe Casa, NP 12/07/2018 2:56 PM

## 2018-12-07 NOTE — Telephone Encounter (Signed)
Pt brought in stool specimen today in a urine specimen cup. This will not be accepted by [facility] to be ran because it is not in proper container. If you still need pt to have these test ran she will need to come in and collect appropriate specimen container. Unaware of who pt spoke with and gave her the urine container.

## 2018-12-07 NOTE — Telephone Encounter (Signed)
I have put correct cup for sample at front desk for patient's husband to pick up.

## 2018-12-08 ENCOUNTER — Encounter: Payer: Self-pay | Admitting: *Deleted

## 2018-12-08 NOTE — Telephone Encounter (Signed)
Spoke with pt Has been going on for 2-3 years.   Diarrhea varies - started loose yesterday, and then started to be watery ( 3 in total). None today.  Quit using sweet n low in coffee No fever, abdominal pain.  No food triggers. No blood, fever.   GI appt in 2 days ( Monday).   Declines repeating stool culture today, empiric abx,  or doing Cdiff quick screen. Feels comfortable awaiting  GI appt for Monday.

## 2018-12-11 ENCOUNTER — Ambulatory Visit: Payer: PPO | Admitting: Gastroenterology

## 2018-12-11 ENCOUNTER — Encounter: Payer: Self-pay | Admitting: Gastroenterology

## 2018-12-11 VITALS — BP 123/74 | HR 66 | Ht 61.5 in | Wt 152.8 lb

## 2018-12-11 DIAGNOSIS — K529 Noninfective gastroenteritis and colitis, unspecified: Secondary | ICD-10-CM | POA: Diagnosis not present

## 2018-12-11 NOTE — Progress Notes (Signed)
Valerie Curry [address]  [place], [place] [address]  Main: [phone]  Fax: [phone]   Gastroenterology Consultation  Referring Provider:     Burnard Hawthorne, FNP Primary Care Physician:  Burnard Hawthorne, FNP Reason for Consultation:     Chronic diarrhea        HPI:    Chief Complaint  Patient presents with  . New Patient (Initial Visit)    diarrhea    Valerie Curry is a 73 y.o. y/o female referred for consultation & management  by Dr. Vidal Schwalbe, Yvetta Coder, FNP.  Patient reports history of chronic diarrhea for years, with worsening over the last year.  Reports 2-3 loose bowel movements a day.  Also reports of last year she has had episodes of fecal incontinence.  Sometimes she feels the stool coming but is unable to make it to the bathroom, other times she does not even feel the stool coming.  No blood in stool.  No weight loss.  No dysphagia or heartburn.  No abdominal pain.  No nausea or vomiting.  Patient has 2 children both were vaginal deliveries.  Denies any urinary incontinence but reports bladder prolapse for which she has an appointment to discuss options for management.  Last colonoscopy was in July 2015 with Dr. Jamal Collin.  It was done for screening and was reported to be normal.  No biopsies were done at that time.  Patient denies any family history of colon cancer.  Primary care provider had ordered stool test and celiac panel.  Celiac panel was normal.  GI stool panel was indeterminate.  Past Medical History:  Diagnosis Date  . Arthritis   . Family history of malignant neoplasm of breast    Maternal/paternal aunts and cousin  . Hypertension   . Lump or mass in breast 2011/2012   benign  . Obesity, unspecified   . Osteopenia    took Boniva 1 1/2 years in past-doesn't want to restart it  . Personal history of tobacco use, presenting hazards to health   . Positive PPD    as a child. xray was normal.  . Systemic sclerosis [facility])      Past Surgical History:  Procedure Laterality Date  . APPENDECTOMY  1961  . BREAST BIOPSY Right Nov 2011   benign  . BREAST BIOPSY Left Nov 2012   dilated ducts-benign  . BUNIONECTOMY  2010  . COLONOSCOPY  8/05   Dr. Vira Agar  . DECOMPRESSIVE LUMBAR LAMINECTOMY LEVEL 2 N/A 04/30/2015   Procedure: CENTRAL DECOMPRESSIVE LUMBER LAMINECTOMY WITH POSSIBLE FORAMINOTOMIES L3-L4,L4-L5;  Surgeon: Latanya Maudlin, MD;  Location: [facility];  Service: Orthopedics;  Laterality: N/A;  . TONSILLECTOMY  1968  . TUBAL LIGATION  1974    Prior to Admission medications   Medication Sig Start Date End Date Taking? Authorizing Provider  Ascorbic Acid (VITAMIN C) 1000 MG tablet Take 1,000 mg by mouth every morning.    Yes [provider]  Calcium Carbonate-Vit D-Min 600-400 MG-UNIT TABS Take 1 tablet by mouth 2 (two) times daily.     Yes [provider]  hydrochlorothiazide (HYDRODIURIL) 25 MG tablet TAKE 0.5 TABLETS (12.5 MG TOTAL) BY MOUTH DAILY. 12/23/17  Yes Burnard Hawthorne, FNP    Family History  Problem Relation Age of Onset  . Lung cancer Father   . Heart disease Father 103       Cardiac Arrest  . Heart disease Mother   . Hypertension Mother   .  Stroke Mother   . Other Brother        blood clots  . Other Other        hypercoag-family history  . Cancer Maternal Aunt        Breast cancer  . Breast cancer Maternal Aunt 75  . Breast cancer Paternal Aunt 77     Social History   Tobacco Use  . Smoking status: Former Smoker    Packs/day: 1.00    Years: 50.00    Pack years: 50.00    Types: Cigarettes    Last attempt to quit: 11/29/2014    Years since quitting: 4.0  . Smokeless tobacco: Former Systems developer    Quit date: 04/30/2015  Substance Use Topics  . Alcohol use: Yes    Alcohol/week: 0.0 standard drinks    Comment: occasional glassof wine   . Drug use: No    Allergies as of 12/11/2018  . (No Known Allergies)    Review of Systems:    All systems reviewed and negative  except where noted in HPI.   Physical Exam:  BP 123/74   Pulse 66   Ht 5' 1.5" (1.562 m)   Wt 152 lb 12.8 oz (69.3 kg)   BMI 28.40 kg/m  No LMP recorded. Patient is postmenopausal. Psych:  Alert and cooperative. Normal mood and affect. General:   Alert,  Well-developed, well-nourished, pleasant and cooperative in NAD Head:  Normocephalic and atraumatic. Eyes:  Sclera clear, no icterus.   Conjunctiva pink. Ears:  Normal auditory acuity. Nose:  No deformity, discharge, or lesions. Mouth:  No deformity or lesions,oropharynx pink & moist. Neck:  Supple; no masses or thyromegaly. Abdomen:  Normal bowel sounds.  No bruits.  Soft, non-tender and non-distended without masses, hepatosplenomegaly or hernias noted.  No guarding or rebound tenderness.    Rectal exam: Slightly decreased rectal tone, 1 small external skin tag.  No masses felt in rectal vault on digital rectal exam. Msk:  Symmetrical without gross deformities. Good, equal movement & strength bilaterally. Pulses:  Normal pulses noted. Extremities:  No clubbing or edema.  No cyanosis. Neurologic:  Alert and oriented x3;  grossly normal neurologically. Skin:  Intact without significant lesions or rashes. No jaundice. Lymph Nodes:  No significant cervical adenopathy. Psych:  Alert and cooperative. Normal mood and affect.   Labs: CBC    Component Value Date/Time   WBC 5.7 11/17/2018 1108   RBC 4.37 11/17/2018 1108   HGB 13.7 11/17/2018 1108   HGB 13.8 09/18/2007 1312   HCT 40.7 11/17/2018 1108   HCT 40.8 09/18/2007 1312   PLT 206.0 11/17/2018 1108   PLT 243 09/18/2007 1312   MCV 93.2 11/17/2018 1108   MCV 93 09/18/2007 1312   MCH 30.9 04/22/2015 0830   MCHC 33.7 11/17/2018 1108   RDW 13.7 11/17/2018 1108   RDW 12.7 09/18/2007 1312   LYMPHSABS 1.6 11/17/2018 1108   LYMPHSABS 2.0 09/18/2007 1312   MONOABS 0.5 11/17/2018 1108   EOSABS 0.1 11/17/2018 1108   EOSABS 0.1 09/18/2007 1312   BASOSABS 0.0 11/17/2018 1108    BASOSABS 0.0 09/18/2007 1312   CMP     Component Value Date/Time   NA 141 11/14/2018 0814   NA 139 10/09/2015   K 4.0 11/14/2018 0814   CL 104 11/14/2018 0814   CO2 31 11/14/2018 0814   GLUCOSE 89 11/14/2018 0814   BUN 25 (H) 11/14/2018 0814   BUN 18 10/09/2015   CREATININE 0.71 11/14/2018 0814   CALCIUM  9.1 11/14/2018 0814   PROT 6.4 11/14/2018 0814   ALBUMIN 4.0 11/14/2018 0814   AST 19 11/14/2018 0814   ALT 12 11/14/2018 0814   ALKPHOS 60 11/14/2018 0814   BILITOT 0.5 11/14/2018 0814   GFRNONAA >60 04/22/2015 0830   GFRAA >60 04/22/2015 0830    Imaging Studies: Ct Chest Lung Cancer Screening Low Dose Wo Contrast  Result Date: 12/07/2018 CLINICAL DATA:  73 year old female former smoker (quit 4 years ago) with 50 pack-year history of smoking. Lung cancer screening examination. EXAM: CT CHEST WITHOUT CONTRAST LOW-DOSE FOR LUNG CANCER SCREENING TECHNIQUE: Multidetector CT imaging of the chest was performed following the standard protocol without IV contrast. COMPARISON:  No priors. FINDINGS: Cardiovascular: Heart size is normal. There is no significant pericardial fluid, thickening or pericardial calcification. There is aortic atherosclerosis, as well as atherosclerosis of the great vessels of the mediastinum and the coronary arteries, including calcified atherosclerotic plaque in the left anterior descending and right coronary arteries. Mediastinum/Nodes: No pathologically enlarged mediastinal or hilar lymph nodes. Please note that accurate exclusion of hilar adenopathy is limited on noncontrast CT scans. Esophagus is unremarkable in appearance. No axillary lymphadenopathy. Lungs/Pleura: Multiple small pulmonary nodules are scattered throughout the lungs bilaterally, largest of which is in the posterior aspect of the left lower lobe (axial image 191 of series 3), with a volume derived mean diameter of 3.6 mm. No larger more suspicious appearing pulmonary nodules or masses are noted. No  acute consolidative airspace disease. No pleural effusions. Mild diffuse bronchial wall thickening with mild centrilobular and paraseptal emphysema. Upper Abdomen: Unremarkable. Musculoskeletal: There are no aggressive appearing lytic or blastic lesions noted in the visualized portions of the skeleton. IMPRESSION: 1. Lung-RADS 2S, benign appearance or behavior. Continue annual screening with low-dose chest CT without contrast in 12 months. 2. The "S" modifier above refers to potentially clinically significant non lung cancer related findings. Specifically, there is aortic atherosclerosis, in addition to 2 vessel coronary artery disease. Please note that although the presence of coronary artery calcium documents the presence of coronary artery disease, the severity of this disease and any potential stenosis cannot be assessed on this non-gated CT examination. Assessment for potential risk factor modification, dietary therapy or pharmacologic therapy may be warranted, if clinically indicated. 3. Mild diffuse bronchial wall thickening with mild centrilobular and paraseptal emphysema; imaging findings suggestive of underlying COPD. Aortic Atherosclerosis (ICD10-I70.0) and Emphysema (ICD10-J43.9). Electronically Signed   By: Vinnie Langton M.D.   On: 12/07/2018 15:58    Assessment and Plan:   Valerie Curry is a 73 y.o. y/o female has been referred for chronic diarrhea with worsening over the last year and patient also reporting intermittent fecal incontinence and bladder prolapse  Patient symptoms are most consistent with likely pelvic floor dysfunction causing her intermittent fecal incontinence and loose stools. Her bladder prolapse and history of vaginal delivery are all consistent with possible history of pelvic floor dysfunction  However, her last colonoscopy was 4-1/2 years ago.  I have reviewed the 2 images available in her procedure report and the image labeled cecum appears to be right outside the  cecum and a picture of the ileocecal valve.  In addition the withdrawal time on the colonoscopy was less than 6 minutes.  And I have discussed these things with the patient and discussed that a repeat colonoscopy will allow [place] to evaluate for and remove any polyps and also obtain biopsies for microscopic colitis.  The alternatives would be foregoing the colonoscopy and  sending her for pelvic floor physical therapy to see if it improves her symptoms or anorectal manometry.  Patient would like to proceed with colonoscopy at this time and pelvic floor physical therapy as well.  I have discussed alternative options, risks & benefits,  which include, but are not limited to, bleeding, infection, perforation,respiratory complication & drug reaction.  The patient agrees with this plan & written consent will be obtained.    No indication for EGD Given chronic diarrhea, no indication for repeat stool testing at this time.   Dr Valerie Curry  Speech recognition software was used to dictate the above note.

## 2018-12-11 NOTE — Telephone Encounter (Signed)
See phone note Seen at GI today

## 2018-12-12 ENCOUNTER — Other Ambulatory Visit: Payer: Self-pay

## 2018-12-12 DIAGNOSIS — R152 Fecal urgency: Secondary | ICD-10-CM

## 2018-12-12 DIAGNOSIS — R159 Full incontinence of feces: Secondary | ICD-10-CM

## 2018-12-12 DIAGNOSIS — K529 Noninfective gastroenteritis and colitis, unspecified: Secondary | ICD-10-CM

## 2018-12-13 ENCOUNTER — Encounter: Payer: Self-pay | Admitting: Family

## 2018-12-13 DIAGNOSIS — I700 Atherosclerosis of aorta: Secondary | ICD-10-CM

## 2018-12-13 HISTORY — DX: Atherosclerosis of aorta: I70.0

## 2018-12-18 ENCOUNTER — Other Ambulatory Visit: Payer: Self-pay | Admitting: Family

## 2018-12-18 DIAGNOSIS — I700 Atherosclerosis of aorta: Secondary | ICD-10-CM

## 2018-12-18 MED ORDER — ROSUVASTATIN CALCIUM 10 MG PO TABS: 10.0000 mg | ORAL_TABLET | Freq: Every day | ORAL | 3 refills | Status: DC

## 2018-12-28 ENCOUNTER — Ambulatory Visit (INDEPENDENT_AMBULATORY_CARE_PROVIDER_SITE_OTHER): Payer: PPO | Admitting: Obstetrics and Gynecology

## 2018-12-28 ENCOUNTER — Encounter: Payer: Self-pay | Admitting: Obstetrics and Gynecology

## 2018-12-28 VITALS — BP 147/92 | HR 92 | Ht 62.0 in | Wt 157.4 lb

## 2018-12-28 DIAGNOSIS — N814 Uterovaginal prolapse, unspecified: Secondary | ICD-10-CM

## 2018-12-28 DIAGNOSIS — R319 Hematuria, unspecified: Secondary | ICD-10-CM | POA: Diagnosis not present

## 2018-12-28 LAB — POCT URINALYSIS DIPSTICK
Bilirubin, UA: NEGATIVE
Glucose, UA: NEGATIVE
Ketones, UA: NEGATIVE
Leukocytes, UA: NEGATIVE
Nitrite, UA: NEGATIVE
Protein, UA: NEGATIVE
Spec Grav, UA: 1.01 (ref 1.010–1.025)
Urobilinogen, UA: 0.2 E.U./dL
pH, UA: 6.5 (ref 5.0–8.0)

## 2018-12-28 NOTE — Progress Notes (Signed)
Patient comes in today with prolapse bladder prolapse. She is having UTI symptoms today. Will get a urine sample today.

## 2018-12-28 NOTE — Progress Notes (Signed)
HPI:      Ms. Valerie Curry is a 73 y.o. G2P2 who LMP was No LMP recorded. Patient is postmenopausal.  Subjective:   She presents today complaining of "bladder prolapse".  She states that sometimes she has difficulty sitting and pelvic discomfort.  She occasionally leaks urine but this not a major problem for her.  She does have difficulty with bowel movements but mainly because she has loose stools.  She is occasionally sexually active but says this is not a major part of her life.  She is considering different options including pessary and surgical options.  She mainly would like to know exactly what the issue is. She states she leads an active life and would like to keep up her daily activities. She does complain of right-sided back pain for 1 day.  Says that she always has urinary symptoms but they have not changed recently.    Hx: The following portions of the patient's history were reviewed and updated as appropriate:             She  has a past medical history of Arthritis, Family history of malignant neoplasm of breast, Hypertension, Lump or mass in breast (2011/2012), Obesity, unspecified, Osteopenia, Personal history of tobacco use, presenting hazards to health, Positive PPD, and Systemic sclerosis ([place]). She does not have any pertinent problems on file. She  has a past surgical history that includes Tonsillectomy (1968); Colonoscopy (8/05); Bunionectomy (2010); Appendectomy (1961); Tubal ligation (1974); Decompressive lumbar laminectomy level 2 (N/A, 04/30/2015); Breast biopsy (Right, Nov 2011); and Breast biopsy (Left, Nov 2012). Her family history includes Breast cancer (age of onset: 22) in her maternal aunt and paternal aunt; Cancer in her maternal aunt; Heart disease in her mother; Heart disease (age of onset: 92) in her father; Hypertension in her mother; Lung cancer in her father; Other in her brother and another family member; Stroke in her mother. She  reports that she quit smoking  about 4 years ago. Her smoking use included cigarettes. She has a 50.00 pack-year smoking history. She quit smokeless tobacco use about 3 years ago. She reports current alcohol use. She reports that she does not use drugs. She has a current medication list which includes the following prescription(s): vitamin c, calcium carbonate-vit d-min, hydrochlorothiazide, and rosuvastatin. She has No Known Allergies.       Review of Systems:  Review of Systems  Constitutional: Denied constitutional symptoms, night sweats, recent illness, fatigue, fever, insomnia and weight loss.  Eyes: Denied eye symptoms, eye pain, photophobia, vision change and visual disturbance.  Ears/Nose/Throat/Neck: Denied ear, nose, throat or neck symptoms, hearing loss, nasal discharge, sinus congestion and sore throat.  Cardiovascular: Denied cardiovascular symptoms, arrhythmia, chest pain/pressure, edema, exercise intolerance, orthopnea and palpitations.  Respiratory: Denied pulmonary symptoms, asthma, pleuritic pain, productive sputum, cough, dyspnea and wheezing.  Gastrointestinal: Denied, gastro-esophageal reflux, melena, nausea and vomiting.  Genitourinary: See HPI for additional information.  Musculoskeletal: Denied musculoskeletal symptoms, stiffness, swelling, muscle weakness and myalgia.  Dermatologic: Denied dermatology symptoms, rash and scar.  Neurologic: Denied neurology symptoms, dizziness, headache, neck pain and syncope.  Psychiatric: Denied psychiatric symptoms, anxiety and depression.  Endocrine: Denied endocrine symptoms including hot flashes and night sweats.   Meds:   Current Outpatient Medications on File Prior to Visit  Medication Sig Dispense Refill  . Ascorbic Acid (VITAMIN C) 1000 MG tablet Take 1,000 mg by mouth every morning.     . Calcium Carbonate-Vit D-Min 600-400 MG-UNIT TABS Take 1 tablet by  mouth 2 (two) times daily.      . hydrochlorothiazide (HYDRODIURIL) 25 MG tablet TAKE 0.5 TABLETS  (12.5 MG TOTAL) BY MOUTH DAILY. 90 tablet 3  . rosuvastatin (CRESTOR) 10 MG tablet Take 1 tablet (10 mg total) by mouth daily. 90 tablet 3   No current facility-administered medications on file prior to visit.     Objective:     Vitals:   12/28/18 0953  BP: (!) 147/92  Pulse: 92              Physical examination   Pelvic:   Vulva: Normal appearance.  No lesions.  Vagina: No lesions or abnormalities noted.  Support:  Uterine procidentia with accompanying cystocele rectocele  Urethra No masses tenderness or scarring.  Meatus Normal size without lesions or prolapse.  Cervix: Normal appearance.  No lesions.  Cervix approximately 1 inch out of the introitus.  Anus: Normal exam.  No lesions.  Perineum: Normal exam.  No lesions.        Bimanual   Uterus: Normal size.  Non-tender.  Mobile.  AV.  Adnexae: No masses.  Non-tender to palpation.  Cul-de-sac: Negative for abnormality.     Assessment:    G2P2 Patient Active Problem List   Diagnosis Date Noted  . Atherosclerosis of aorta ([place]) 12/13/2018  . HLD (hyperlipidemia) 11/17/2018  . Uterine prolapse 11/17/2018  . Diarrhea 11/17/2018  . Family history of clotting disorder 11/15/2017  . Encounter for screening for malignant neoplasm of respiratory organs 10/06/2015  . Postmenopausal estrogen deficiency 10/06/2015  . Spinal stenosis, lumbar region, with neurogenic claudication 04/30/2015  . Routine physical examination 03/25/2015  . Low back pain 03/25/2015  . Screening for breast cancer 09/13/2014  . Obesity (BMI 30-39.9) 09/13/2014  . HTN (hypertension) 08/31/2013  . OSTEOPENIA 06/27/2007     1. Uterine prolapse   2. Hematuria, unspecified type       Plan:            1.  Discussed multiple options including surgical and pessary.  Patient will consider her options and inform us when she is reached a decision.  2.  Because hematuria and symptomatic back pain urine sent for culture and sensitivity.  Will contact  patient if antibiotics are necessary.  Orders Orders Placed This Encounter  Procedures  . Urine Culture  . POCT urinalysis dipstick    No orders of the defined types were placed in this encounter.     F/U  Return for We will contact her with any abnormal test results, Pt to contact us if symptoms worsen. I spent 31 minutes involved in the care of this patient of which greater than 50% was spent discussing pelvic relaxation, uterine prolapse, risks and benefits of surgery versus pessary.  Possibility of bladder infection.  Many questions answered long discussion regarding pelvic relaxation.  Finis Bud, M.D. 12/28/2018 10:52 AM

## 2018-12-30 LAB — URINE CULTURE: Organism ID, Bacteria: NO GROWTH

## 2019-01-08 ENCOUNTER — Telehealth: Payer: Self-pay | Admitting: Gastroenterology

## 2019-01-08 NOTE — Telephone Encounter (Signed)
Pt called to check on coding on her upcoming procedure patient stated her insurance would cover it if we change it to a screening colonoscopy rather then Diagnostic. I informed the patient because she came in for a visit with an issue procedure will be a diagnostic pt was informed it is against the law to change a code just to make it covered by insurance. Pt verbally understood.

## 2019-01-09 ENCOUNTER — Encounter: Payer: Self-pay | Admitting: Anesthesiology

## 2019-01-10 ENCOUNTER — Encounter
Admission: RE | Disposition: A | Discharge status: Home / Self Care | Payer: Self-pay | Source: Home / Self Care | Attending: Gastroenterology

## 2019-01-10 ENCOUNTER — Ambulatory Visit
Admission: RE | Admit: 2019-01-10 | Discharge: 2019-01-10 | Disposition: A | Discharge status: Home / Self Care | Payer: PPO | Attending: Gastroenterology | Admitting: Gastroenterology

## 2019-01-10 ENCOUNTER — Ambulatory Visit: Payer: PPO | Admitting: Anesthesiology

## 2019-01-10 DIAGNOSIS — K529 Noninfective gastroenteritis and colitis, unspecified: Secondary | ICD-10-CM | POA: Diagnosis not present

## 2019-01-10 DIAGNOSIS — Z79899 Other long term (current) drug therapy: Secondary | ICD-10-CM | POA: Diagnosis not present

## 2019-01-10 DIAGNOSIS — Z87891 Personal history of nicotine dependence: Secondary | ICD-10-CM | POA: Insufficient documentation

## 2019-01-10 DIAGNOSIS — R197 Diarrhea, unspecified: Secondary | ICD-10-CM | POA: Insufficient documentation

## 2019-01-10 DIAGNOSIS — E785 Hyperlipidemia, unspecified: Secondary | ICD-10-CM | POA: Diagnosis not present

## 2019-01-10 DIAGNOSIS — K635 Polyp of colon: Secondary | ICD-10-CM | POA: Insufficient documentation

## 2019-01-10 DIAGNOSIS — I10 Essential (primary) hypertension: Secondary | ICD-10-CM | POA: Diagnosis not present

## 2019-01-10 DIAGNOSIS — D124 Benign neoplasm of descending colon: Secondary | ICD-10-CM

## 2019-01-10 HISTORY — PX: COLONOSCOPY WITH PROPOFOL: SHX5780

## 2019-01-10 SURGERY — COLONOSCOPY WITH PROPOFOL
Anesthesia: General

## 2019-01-10 MED ORDER — PROPOFOL 500 MG/50ML IV EMUL
INTRAVENOUS | Status: DC | PRN
  Administered 2019-01-10: 125 ug/kg/min via INTRAVENOUS

## 2019-01-10 MED ORDER — SODIUM CHLORIDE 0.9 % IV SOLN
INTRAVENOUS | Status: DC
  Administered 2019-01-10: 08:00:00 via INTRAVENOUS

## 2019-01-10 MED ORDER — LIDOCAINE HCL (PF) 2 % IJ SOLN
INTRAMUSCULAR | Status: AC
  Filled 2019-01-10: qty 10

## 2019-01-10 MED ORDER — LIDOCAINE HCL (CARDIAC) PF 100 MG/5ML IV SOSY
PREFILLED_SYRINGE | INTRAVENOUS | Status: DC | PRN
  Administered 2019-01-10: 50 mg via INTRAVENOUS

## 2019-01-10 MED ORDER — PROPOFOL 500 MG/50ML IV EMUL
INTRAVENOUS | Status: AC
  Filled 2019-01-10: qty 50

## 2019-01-10 MED ORDER — PROPOFOL 10 MG/ML IV BOLUS
INTRAVENOUS | Status: DC | PRN
  Administered 2019-01-10 (×2): 17 mg via INTRAVENOUS
  Administered 2019-01-10: 80 mg via INTRAVENOUS

## 2019-01-10 NOTE — Op Note (Addendum)
[facility] Gastroenterology Patient Name: [facility] Procedure Date: 01/10/2019 7:15 AM MRN: [id number] Account #: [id number] Date of Birth: [birth year] Admit Type: Outpatient Age: 73 Room: [facility] ENDO ROOM 4 Gender: Female Note Status: Finalized Procedure:            Colonoscopy Indications:          Clinically significant diarrhea of unexplained origin Providers:            Haylie Mccutcheon B. Bonna Gains MD, MD Referring MD:         Baxter Hire, MD (Referring MD) Medicines:            Monitored Anesthesia Care Complications:        No immediate complications. Procedure:            Pre-Anesthesia Assessment:                       - ASA Grade Assessment: II - A patient with mild                        systemic disease.                       - Prior to the procedure, a History and Physical was                        performed, and patient medications, allergies and                        sensitivities were reviewed. The patient's tolerance of                        previous anesthesia was reviewed.                       - The risks and benefits of the procedure and the                        sedation options and risks were discussed with the                        patient. All questions were answered and informed                        consent was obtained.                       - Patient identification and proposed procedure were                        verified prior to the procedure by the physician, the                        nurse, the anesthesiologist, the anesthetist and the                        technician. The procedure was verified in the procedure                        room.  After obtaining informed consent, the colonoscope was                        passed under direct vision. Throughout the procedure,                        the patient's blood pressure, pulse, and oxygen                        saturations were monitored  continuously. The                        Colonoscope was introduced through the anus and                        advanced to the the cecum, identified by appendiceal                        orifice and ileocecal valve. The Colonoscope was                        introduced through the anus and advanced to the the                        cecum, identified by appendiceal orifice and ileocecal                        valve. The colonoscopy was performed with ease. The                        patient tolerated the procedure well. The quality of                        the bowel preparation was good. Findings:      The perianal and digital rectal examinations were normal.      Two sessile polyps were found in the descending colon. The polyps were 2       to 4 mm in size. These polyps were removed with a cold biopsy forceps.       Resection and retrieval were complete.      The exam was otherwise without abnormality.      The rectum, sigmoid colon, descending colon, transverse colon, ascending       colon and cecum appeared normal. Biopsies for histology were taken with       a cold forceps from the entire colon for evaluation of microscopic       colitis.      The retroflexed view of the distal rectum and anal verge was normal and       showed no anal or rectal abnormalities. Impression:           - Two 2 to 4 mm polyps in the descending colon, removed                        with a cold biopsy forceps. Resected and retrieved.                       - The examination was otherwise normal.                       -  The rectum, sigmoid colon, descending colon,                        transverse colon, ascending colon and cecum are normal.                        Biopsied.                       - The distal rectum and anal verge are normal on                        retroflexion view. Recommendation:       - Discharge patient to home (with escort).                       - Advance diet as tolerated.                        - Continue present medications.                       - Await pathology results.                       - Repeat colonoscopy date to be determined after                        pending pathology results are reviewed.                       - The findings and recommendations were discussed with                        the patient.                       - The findings and recommendations were discussed with                        the patient's family.                       - Return to primary care physician as previously                        scheduled. Procedure Code(s):    --- Professional ---                       [phone], Colonoscopy, flexible; with biopsy, single or                        multiple Diagnosis Code(s):    --- Professional ---                       D12.4, Benign neoplasm of descending colon                       R19.7, Diarrhea, unspecified CPT copyright 2018 American Medical Association. All rights reserved. The codes documented in this report are preliminary and upon coder review may  be revised to meet current compliance requirements.  Vonda Antigua, MD Margretta Sidle B. Kiyaan Haq MD, MD 01/10/2019 8:59:52 AM This  report has been signed electronically. Number of Addenda: 0 Note Initiated On: 01/10/2019 7:15 AM Scope Withdrawal Time: 0 hours 19 minutes 47 seconds  Total Procedure Duration: 0 hours 35 minutes 49 seconds  Estimated Blood Loss: Estimated blood loss: none.      [facility]

## 2019-01-10 NOTE — Transfer of Care (Signed)
Immediate Anesthesia Transfer of Care Note  Patient: Valerie Curry  Procedure(s) Performed: COLONOSCOPY WITH PROPOFOL (N/A )  Patient Location: PACU and Endoscopy Unit  Anesthesia Type:General  Level of Consciousness: awake, alert , oriented and patient cooperative  Airway & Oxygen Therapy: Patient Spontanous Breathing  Post-op Assessment: Report given to RN and Post -op Vital signs reviewed and stable  Post vital signs: Reviewed and stable  Last Vitals:  Vitals Value Taken Time  BP 90/63 01/10/2019  8:56 AM  Temp    Pulse 76 01/10/2019  8:56 AM  Resp    SpO2 99 % 01/10/2019  8:56 AM  Vitals shown include unvalidated device data.  Last Pain:  Vitals:   01/10/19 0740  TempSrc: Tympanic         Complications: No apparent anesthesia complications

## 2019-01-10 NOTE — H&P (Signed)
Valerie Antigua, MD [address] [place], [place], [place], [address] [address], [place], [place], [place], [address] Phone: [phone]  Fax: [phone]  Primary Care Physician:  Burnard Hawthorne, FNP   Pre-Procedure History & Physical: HPI:  Valerie Curry is a 73 y.o. female is here for a colonoscopy.   Past Medical History:  Diagnosis Date  . Arthritis   . Family history of malignant neoplasm of breast    Maternal/paternal aunts and cousin  . Hypertension   . Lump or mass in breast 2011/2012   benign  . Obesity, unspecified   . Osteopenia    took Boniva 1 1/2 years in past-doesn't want to restart it  . Personal history of tobacco use, presenting hazards to health   . Positive PPD    as a child. xray was normal.  . Systemic sclerosis [facility])     Past Surgical History:  Procedure Laterality Date  . APPENDECTOMY  1961  . BREAST BIOPSY Right Nov 2011   benign  . BREAST BIOPSY Left Nov 2012   dilated ducts-benign  . BUNIONECTOMY  2010  . COLONOSCOPY  8/05   Dr. Vira Agar  . DECOMPRESSIVE LUMBAR LAMINECTOMY LEVEL 2 N/A 04/30/2015   Procedure: CENTRAL DECOMPRESSIVE LUMBER LAMINECTOMY WITH POSSIBLE FORAMINOTOMIES L3-L4,L4-L5;  Surgeon: Latanya Maudlin, MD;  Location: [facility] ORS;  Service: Orthopedics;  Laterality: N/A;  . TONSILLECTOMY  1968  . TUBAL LIGATION  1974    Prior to Admission medications   Medication Sig Start Date End Date Taking? Authorizing Provider  hydrochlorothiazide (HYDRODIURIL) 25 MG tablet TAKE 0.5 TABLETS (12.5 MG TOTAL) BY MOUTH DAILY. 12/23/17  Yes Burnard Hawthorne, FNP  rosuvastatin (CRESTOR) 10 MG tablet Take 1 tablet (10 mg total) by mouth daily. 12/18/18  Yes Burnard Hawthorne, FNP  Ascorbic Acid (VITAMIN C) 1000 MG tablet Take 1,000 mg by mouth every morning.     [provider]  Calcium Carbonate-Vit D-Min 600-400 MG-UNIT TABS Take 1 tablet by mouth 2 (two) times daily.      [provider]    Allergies as of  12/12/2018  . (No Known Allergies)    Family History  Problem Relation Age of Onset  . Lung cancer Father   . Heart disease Father 52       Cardiac Arrest  . Heart disease Mother   . Hypertension Mother   . Stroke Mother   . Other Brother        blood clots  . Other Other        hypercoag-family history  . Cancer Maternal Aunt        Breast cancer  . Breast cancer Maternal Aunt 60  . Breast cancer Paternal Aunt 59    Social History   Socioeconomic History  . Marital status: Married    Spouse name: Not on file  . Number of children: 2  . Years of education: 45  . Highest education level: Not on file  Occupational History  . Occupation: Facilities manager: [employer]  . Financial resource strain: Not on file  . Food insecurity:    Worry: Not on file    Inability: Not on file  . Transportation needs:    Medical: Not on file    Non-medical: Not on file  Tobacco Use  . Smoking status: Former Smoker    Packs/day: 1.00    Years: 50.00    Pack years: 50.00  Types: Cigarettes    Last attempt to quit: 11/29/2014    Years since quitting: 4.1  . Smokeless tobacco: Former Systems developer    Quit date: 04/30/2015  Substance and Sexual Activity  . Alcohol use: Yes    Alcohol/week: 0.0 standard drinks    Comment: occasional glassof wine   . Drug use: No  . Sexual activity: Yes    Birth control/protection: Post-menopausal  Lifestyle  . Physical activity:    Days per week: Not on file    Minutes per session: Not on file  . Stress: Not on file  Relationships  . Social connections:    Talks on phone: Not on file    Gets together: Not on file    Attends religious service: Not on file    Active member of club or organization: Not on file    Attends meetings of clubs or organizations: Not on file    Relationship status: Not on file  . Intimate partner violence:    Fear of current or ex partner: Not on file    Emotionally abused: Not on file     Physically abused: Not on file    Forced sexual activity: Not on file  Other Topics Concern  . Not on file  Social History Narrative   Pt is 73yo female. Pt was born in [place], moved to [place] when she was 70.  Pt is married to husband of 22 years.  Pt has 2 sons from a previous marriage and 4 step sons and 6 Grandchildren.        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        Pt works out at gym 3/week (2 days a week in a class, 1 day a week with an Dietitian).  Pt enjoys reading, spending time with her family. Pt and her husband are members of [employer] and active in their church.     Review of Systems: See HPI, otherwise negative ROS  Physical Exam: BP (!) 142/75   Pulse 77   Temp (!) 96.9 F (36.1 C) (Tympanic)   Resp 16   Ht 5\' 2"  (1.575 m)   Wt 68 kg   SpO2 100%   BMI 27.44 kg/m  General:   Alert,  pleasant and cooperative in NAD Head:  Normocephalic and atraumatic. Neck:  Supple; no masses or thyromegaly. Lungs:  Clear throughout to auscultation, normal respiratory effort.    Heart:  +S1, +S2, Regular rate and rhythm, No edema. Abdomen:  Soft, nontender and nondistended. Normal bowel sounds, without guarding, and without rebound.   Neurologic:  Alert and  oriented x4;  grossly normal neurologically.  Impression/Plan: Valerie Curry is here for a colonoscopy to be performed for diarrhea  Risks, benefits, limitations, and alternatives regarding  colonoscopy have been reviewed with the patient.  Questions have been answered.  All parties agreeable.   Virgel Manifold, MD  01/10/2019, 8:09 AM

## 2019-01-10 NOTE — Anesthesia Post-op Follow-up Note (Signed)
Anesthesia QCDR form completed.        

## 2019-01-10 NOTE — Anesthesia Postprocedure Evaluation (Signed)
Anesthesia Post Note  Patient: Valerie Curry  Procedure(s) Performed: COLONOSCOPY WITH PROPOFOL (N/A )  Patient location during evaluation: Endoscopy Anesthesia Type: General Level of consciousness: awake and alert Pain management: pain level controlled Vital Signs Assessment: post-procedure vital signs reviewed and stable Respiratory status: spontaneous breathing, nonlabored ventilation, respiratory function stable and patient connected to nasal cannula oxygen Cardiovascular status: blood pressure returned to baseline and stable Postop Assessment: no apparent nausea or vomiting Anesthetic complications: no     Last Vitals:  Vitals:   01/10/19 0900 01/10/19 0910  BP: 112/62 116/60  Pulse: 73 63  Resp: 19 13  Temp:    SpO2: 99% 100%    Last Pain:  Vitals:   01/10/19 0850  TempSrc: Tympanic                 Eisen Robenson S

## 2019-01-10 NOTE — Anesthesia Preprocedure Evaluation (Addendum)
Anesthesia Evaluation  Patient identified by MRN, date of birth, ID band Patient awake    Reviewed: Allergy & Precautions, NPO status , Patient's Chart, lab work & pertinent test results, reviewed documented beta blocker date and time   Airway Mallampati: II  TM Distance: >3 FB     Dental  (+) Chipped   Pulmonary former smoker,           Cardiovascular hypertension, Pt. on medications      Neuro/Psych    GI/Hepatic   Endo/Other    Renal/GU      Musculoskeletal  (+) Arthritis ,   Abdominal   Peds  Hematology   Anesthesia Other Findings Quit smoke 4 y ago.  Reproductive/Obstetrics                            Anesthesia Physical Anesthesia Plan  ASA: III  Anesthesia Plan: General   Post-op Pain Management:    Induction: Intravenous  PONV Risk Score and Plan:   Airway Management Planned:   Additional Equipment:   Intra-op Plan:   Post-operative Plan:   Informed Consent: I have reviewed the patients History and Physical, chart, labs and discussed the procedure including the risks, benefits and alternatives for the proposed anesthesia with the patient or authorized representative who has indicated his/her understanding and acceptance.       Plan Discussed with: CRNA  Anesthesia Plan Comments:         Anesthesia Quick Evaluation

## 2019-01-11 ENCOUNTER — Encounter: Payer: Self-pay | Admitting: Gastroenterology

## 2019-01-11 LAB — SURGICAL PATHOLOGY

## 2019-01-22 ENCOUNTER — Ambulatory Visit
Admission: RE | Admit: 2019-01-22 | Discharge: 2019-01-22 | Disposition: A | Discharge status: Home / Self Care | Payer: PPO | Source: Ambulatory Visit | Attending: Family | Admitting: Family

## 2019-01-22 ENCOUNTER — Encounter: Payer: Self-pay | Admitting: Gastroenterology

## 2019-01-22 DIAGNOSIS — M85851 Other specified disorders of bone density and structure, right thigh: Secondary | ICD-10-CM | POA: Diagnosis not present

## 2019-01-22 DIAGNOSIS — Z13820 Encounter for screening for osteoporosis: Secondary | ICD-10-CM | POA: Diagnosis not present

## 2019-01-22 DIAGNOSIS — M8588 Other specified disorders of bone density and structure, other site: Secondary | ICD-10-CM | POA: Insufficient documentation

## 2019-01-22 DIAGNOSIS — Z1231 Encounter for screening mammogram for malignant neoplasm of breast: Secondary | ICD-10-CM | POA: Insufficient documentation

## 2019-01-22 DIAGNOSIS — M810 Age-related osteoporosis without current pathological fracture: Secondary | ICD-10-CM | POA: Diagnosis not present

## 2019-02-05 ENCOUNTER — Encounter: Payer: Self-pay | Admitting: Family

## 2019-02-05 ENCOUNTER — Ambulatory Visit (INDEPENDENT_AMBULATORY_CARE_PROVIDER_SITE_OTHER): Payer: PPO | Admitting: Family

## 2019-02-05 ENCOUNTER — Other Ambulatory Visit: Payer: Self-pay

## 2019-02-05 DIAGNOSIS — M810 Age-related osteoporosis without current pathological fracture: Secondary | ICD-10-CM | POA: Diagnosis not present

## 2019-02-05 NOTE — Assessment & Plan Note (Signed)
Discussed density scan at length.  Patient's preference is to monitor and repeat imaging in another 2 years.  I think this appropriate particularly as she has an upcoming major dental procedure this summer.  She will continue vitamin D, calcium, exercise.  She will let me know of any concerns.

## 2019-02-05 NOTE — Progress Notes (Signed)
Subjective:    Patient ID: Valerie Curry, female    DOB: 07-Apr-1946, 73 y.o.   MRN: [id number]  CC: FLORENA KOZMA is a 73 y.o. female who presents today for follow up.   HPI: Feels well today, no complaints   Here today to discuss her pregnancy scan  osteoporosis- had been on Boniva years ago, around 2012. Boniva caused lightheadedness, nausea. Planning on tooth removal this summer. No implant.  On calcium vitamin D  Regularly exercises, yoga, walking  No h/o fracture.      HISTORY:  Past Medical History:  Diagnosis Date  . Arthritis   . Family history of malignant neoplasm of breast    Maternal/paternal aunts and cousin  . Hypertension   . Lump or mass in breast 2011/2012   benign  . Obesity, unspecified   . Osteopenia    took Boniva 1 1/2 years in past-doesn't want to restart it  . Personal history of tobacco use, presenting hazards to health   . Positive PPD    as a child. xray was normal.  . Systemic sclerosis [facility])    Past Surgical History:  Procedure Laterality Date  . APPENDECTOMY  1961  . BREAST BIOPSY Right Nov 2011   benign  . BREAST BIOPSY Left Nov 2012   dilated ducts-benign  . BUNIONECTOMY  2010  . COLONOSCOPY  8/05   Dr. Vira Agar  . COLONOSCOPY WITH PROPOFOL N/A 01/10/2019   Procedure: COLONOSCOPY WITH PROPOFOL;  Surgeon: Virgel Manifold, MD;  Location: [facility] ENDOSCOPY;  Service: Endoscopy;  Laterality: N/A;  . DECOMPRESSIVE LUMBAR LAMINECTOMY LEVEL 2 N/A 04/30/2015   Procedure: CENTRAL DECOMPRESSIVE LUMBER LAMINECTOMY WITH POSSIBLE FORAMINOTOMIES L3-L4,L4-L5;  Surgeon: Latanya Maudlin, MD;  Location: [facility] ORS;  Service: Orthopedics;  Laterality: N/A;  . TONSILLECTOMY  1968  . TUBAL LIGATION  1974   Family History  Problem Relation Age of Onset  . Lung cancer Father   . Heart disease Father 96       Cardiac Arrest  . Heart disease Mother   . Hypertension Mother   . Stroke Mother   . Other Brother        blood clots  . Other Other       hypercoag-family history  . Cancer Maternal Aunt        Breast cancer  . Breast cancer Maternal Aunt 6  . Breast cancer Paternal Aunt 32  . Breast cancer Cousin     Allergies: Patient has no known allergies. Current Outpatient Medications on File Prior to Visit  Medication Sig Dispense Refill  . hydrochlorothiazide (HYDRODIURIL) 25 MG tablet TAKE 0.5 TABLETS (12.5 MG TOTAL) BY MOUTH DAILY. 90 tablet 3  . rosuvastatin (CRESTOR) 10 MG tablet Take 1 tablet (10 mg total) by mouth daily. 90 tablet 3  . Ascorbic Acid (VITAMIN C) 1000 MG tablet Take 1,000 mg by mouth every morning.     . Calcium Carbonate-Vit D-Min 600-400 MG-UNIT TABS Take 1 tablet by mouth 2 (two) times daily.       No current facility-administered medications on file prior to visit.     Social History   Tobacco Use  . Smoking status: Former Smoker    Packs/day: 1.00    Years: 50.00    Pack years: 50.00    Types: Cigarettes    Last attempt to quit: 11/29/2014    Years since quitting: 4.1  . Smokeless tobacco: Former Systems developer    Quit date: 04/30/2015  Substance Use Topics  .  Alcohol use: Yes    Alcohol/week: 0.0 standard drinks    Comment: occasional glassof wine   . Drug use: No    Review of Systems  Constitutional: Negative for chills and fever.  Respiratory: Negative for cough.   Cardiovascular: Negative for chest pain and palpitations.  Gastrointestinal: Negative for nausea and vomiting.      Objective:    BP 122/68 (BP Location: Left Arm, Patient Position: Sitting, Cuff Size: Large)   Pulse 81   Temp 97.7 F (36.5 C)   Wt 156 lb 6.4 oz (70.9 kg)   SpO2 98%   BMI 28.61 kg/m  BP Readings from Last 3 Encounters:  02/05/19 122/68  01/10/19 116/60  12/28/18 (!) 147/92   Wt Readings from Last 3 Encounters:  02/05/19 156 lb 6.4 oz (70.9 kg)  01/10/19 150 lb (68 kg)  12/28/18 157 lb 6.4 oz (71.4 kg)    Physical Exam Vitals signs reviewed.  Constitutional:      Appearance: She is well-developed.   Eyes:     Conjunctiva/sclera: Conjunctivae normal.  Cardiovascular:     Rate and Rhythm: Normal rate and regular rhythm.     Pulses: Normal pulses.     Heart sounds: Normal heart sounds.  Pulmonary:     Effort: Pulmonary effort is normal.     Breath sounds: Normal breath sounds. No wheezing, rhonchi or rales.  Skin:    General: Skin is warm and dry.  Neurological:     Mental Status: She is alert.  Psychiatric:        Speech: Speech normal.        Behavior: Behavior normal.        Thought Content: Thought content normal.        Assessment & Plan:   Problem List Items Addressed This Visit      Musculoskeletal and Integument   Osteoporosis    Discussed density scan at length.  Patient's preference is to monitor and repeat imaging in another 2 years.  I think this appropriate particularly as she has an upcoming major dental procedure this summer.  She will continue vitamin D, calcium, exercise.  She will let me know of any concerns.          I am having Valerie Curry maintain her vitamin C, Calcium Carbonate-Vit D-Min, hydrochlorothiazide, and rosuvastatin.   No orders of the defined types were placed in this encounter.   Return precautions given.   Risks, benefits, and alternatives of the medications and treatment plan prescribed today were discussed, and patient expressed understanding.   Education regarding symptom management and diagnosis given to patient on AVS.  Continue to follow with Burnard Hawthorne, FNP for routine health maintenance.   Valerie Curry and I agreed with plan.   Mable Paris, FNP

## 2019-02-05 NOTE — Patient Instructions (Addendum)
We will continue to monitor your bone health and continue the good work you are doing.  Nice to see you!

## 2019-02-07 ENCOUNTER — Encounter: Payer: Self-pay | Admitting: Family

## 2019-02-08 ENCOUNTER — Other Ambulatory Visit: Payer: Self-pay

## 2019-02-08 DIAGNOSIS — I10 Essential (primary) hypertension: Secondary | ICD-10-CM

## 2019-02-08 MED ORDER — HYDROCHLOROTHIAZIDE 25 MG PO TABS: 12.5000 mg | ORAL_TABLET | Freq: Every day | ORAL | 3 refills | Status: DC

## 2019-05-08 DIAGNOSIS — H2513 Age-related nuclear cataract, bilateral: Secondary | ICD-10-CM | POA: Diagnosis not present

## 2019-08-01 ENCOUNTER — Ambulatory Visit (INDEPENDENT_AMBULATORY_CARE_PROVIDER_SITE_OTHER): Payer: PPO

## 2019-08-01 ENCOUNTER — Other Ambulatory Visit: Payer: Self-pay

## 2019-08-01 DIAGNOSIS — Z23 Encounter for immunization: Secondary | ICD-10-CM | POA: Diagnosis not present

## 2019-09-11 ENCOUNTER — Telehealth: Payer: Self-pay

## 2019-09-11 NOTE — Telephone Encounter (Signed)
Patient is calling she states since yesterday she has had epigastric abdominal pain that is right under the breast bone. Patient states the pain is a sharp pain. She states she get nausea and clammy. She states the pain has improved a little this morning. Offered patient a appointment on 10/16/19 in [place] patient states she is not driving to [place]. Made appointment 10/24/19. Patient wants to know what to do till she is seen

## 2019-09-12 ENCOUNTER — Ambulatory Visit
Admission: RE | Admit: 2019-09-12 | Discharge: 2019-09-12 | Disposition: A | Discharge status: Home / Self Care | Payer: PPO | Source: Ambulatory Visit | Attending: Internal Medicine | Admitting: Internal Medicine

## 2019-09-12 ENCOUNTER — Other Ambulatory Visit: Payer: Self-pay

## 2019-09-12 ENCOUNTER — Ambulatory Visit
Admission: RE | Admit: 2019-09-12 | Discharge: 2019-09-12 | Disposition: A | Discharge status: Home / Self Care | Payer: PPO | Attending: Internal Medicine | Admitting: Internal Medicine

## 2019-09-12 ENCOUNTER — Other Ambulatory Visit
Admission: RE | Admit: 2019-09-12 | Discharge: 2019-09-12 | Disposition: A | Discharge status: Home / Self Care | Payer: PPO | Source: Home / Self Care | Attending: Internal Medicine | Admitting: Internal Medicine

## 2019-09-12 ENCOUNTER — Encounter: Payer: Self-pay | Admitting: Internal Medicine

## 2019-09-12 ENCOUNTER — Ambulatory Visit: Payer: Self-pay

## 2019-09-12 ENCOUNTER — Ambulatory Visit (INDEPENDENT_AMBULATORY_CARE_PROVIDER_SITE_OTHER): Payer: PPO | Admitting: Internal Medicine

## 2019-09-12 VITALS — Ht 62.0 in | Wt 158.0 lb

## 2019-09-12 DIAGNOSIS — Z20822 Contact with and (suspected) exposure to covid-19: Secondary | ICD-10-CM

## 2019-09-12 DIAGNOSIS — R059 Cough, unspecified: Secondary | ICD-10-CM

## 2019-09-12 DIAGNOSIS — R0602 Shortness of breath: Secondary | ICD-10-CM | POA: Diagnosis not present

## 2019-09-12 DIAGNOSIS — R1013 Epigastric pain: Secondary | ICD-10-CM

## 2019-09-12 DIAGNOSIS — R509 Fever, unspecified: Secondary | ICD-10-CM

## 2019-09-12 DIAGNOSIS — R110 Nausea: Secondary | ICD-10-CM | POA: Diagnosis not present

## 2019-09-12 DIAGNOSIS — R05 Cough: Secondary | ICD-10-CM

## 2019-09-12 DIAGNOSIS — Z20828 Contact with and (suspected) exposure to other viral communicable diseases: Secondary | ICD-10-CM | POA: Diagnosis not present

## 2019-09-12 LAB — URINALYSIS, ROUTINE W REFLEX MICROSCOPIC
Bilirubin Urine: NEGATIVE
Glucose, UA: NEGATIVE mg/dL
Ketones, ur: NEGATIVE mg/dL
Nitrite: POSITIVE — AB
Protein, ur: NEGATIVE mg/dL
Specific Gravity, Urine: 1.011 (ref 1.005–1.030)
pH: 5 (ref 5.0–8.0)

## 2019-09-12 LAB — CBC WITH DIFFERENTIAL/PLATELET
Abs Immature Granulocytes: 0.02 10*3/uL (ref 0.00–0.07)
Basophils Absolute: 0 10*3/uL (ref 0.0–0.1)
Basophils Relative: 0 %
Eosinophils Absolute: 0.1 10*3/uL (ref 0.0–0.5)
Eosinophils Relative: 1 %
HCT: 42.7 % (ref 36.0–46.0)
Hemoglobin: 14 g/dL (ref 12.0–15.0)
Immature Granulocytes: 0 %
Lymphocytes Relative: 15 %
Lymphs Abs: 1 10*3/uL (ref 0.7–4.0)
MCH: 30.7 pg (ref 26.0–34.0)
MCHC: 32.8 g/dL (ref 30.0–36.0)
MCV: 93.6 fL (ref 80.0–100.0)
Monocytes Absolute: 0.6 10*3/uL (ref 0.1–1.0)
Monocytes Relative: 9 %
Neutro Abs: 5 10*3/uL (ref 1.7–7.7)
Neutrophils Relative %: 75 %
Platelets: 177 10*3/uL (ref 150–400)
RBC: 4.56 MIL/uL (ref 3.87–5.11)
RDW: 13.2 % (ref 11.5–15.5)
WBC: 6.8 10*3/uL (ref 4.0–10.5)
nRBC: 0 % (ref 0.0–0.2)

## 2019-09-12 LAB — COMPREHENSIVE METABOLIC PANEL
ALT: 93 U/L — ABNORMAL HIGH (ref 0–44)
AST: 83 U/L — ABNORMAL HIGH (ref 15–41)
Albumin: 4.1 g/dL (ref 3.5–5.0)
Alkaline Phosphatase: 73 U/L (ref 38–126)
Anion gap: 8 (ref 5–15)
BUN: 22 mg/dL (ref 8–23)
CO2: 29 mmol/L (ref 22–32)
Calcium: 9.4 mg/dL (ref 8.9–10.3)
Chloride: 102 mmol/L (ref 98–111)
Creatinine, Ser: 0.77 mg/dL (ref 0.44–1.00)
GFR calc Af Amer: >60 mL/min (ref >60)
GFR calc non Af Amer: >60 mL/min (ref >60)
Glucose, Bld: 105 mg/dL — ABNORMAL HIGH (ref 70–99)
Potassium: 3.5 mmol/L (ref 3.5–5.1)
Sodium: 139 mmol/L (ref 135–145)
Total Bilirubin: 0.8 mg/dL (ref 0.3–1.2)
Total Protein: 7.2 g/dL (ref 6.5–8.1)

## 2019-09-12 LAB — LIPASE, BLOOD: Lipase: 28 U/L (ref 11–51)

## 2019-09-12 NOTE — Telephone Encounter (Signed)
Patient called PCP this morning stating she was running a 101.9 fever. They made appointment to see PCP at 11:30.  Per Dr. Bonna Gains Please call her and let her know that due to her fever, she would first need to be evaluated by her PCP for COVID and they would need to decide if she needs to be tested before she can be seen in clinic. I would recommend that she see her PCP today and talk about her abdominal pain as well, as they can also recommend treatment or imaging for abdominal pain as necessary. We can do a virtual visit with her as the last appt today or tomorrow if she is agreeable to that

## 2019-09-12 NOTE — Telephone Encounter (Signed)
Patient states right now she does not want to do a virtual visit. She states that she will see what her PCP says and go from there. She will give [place] a call back if she wants to do a virtual visit.

## 2019-09-12 NOTE — Telephone Encounter (Signed)
Pt scheduled with Dr Olivia Mackie today at 11:30

## 2019-09-12 NOTE — Progress Notes (Signed)
Telephone Note  I connected with Valerie Curry  on 09/12/19 at 11:50 AM EDT by a telephone application and verified that I am speaking with the correct person using two identifiers.  Location patient: home Location provider:work or home office Persons participating in the virtual visit: patient, provider  I discussed the limitations of evaluation and management by telemedicine and the availability of in person appointments. The patient expressed understanding and agreed to proceed.   HPI: Yesterday had h/a all day, chills, body temp to 101. 9 F  Tried aspirin low dose and fever down to 100 F and then 99.3 at 11:30 today also c/o sore throat, panting with breathing last night and cough. She has been wearing a mask except in exercise class outdoors at the [employer]. She also had upper epigastric ab pain Monday with some nausea did not vomit but had diarrhea  Denies sinus issues never had sinus infection. She does still have her gallbladder     ROS: See pertinent positives and negatives per HPI.  Past Medical History:  Diagnosis Date  . Arthritis   . Family history of malignant neoplasm of breast    Maternal/paternal aunts and cousin  . Hypertension   . Lump or mass in breast 2011/2012   benign  . Obesity, unspecified   . Osteopenia    took Boniva 1 1/2 years in past-doesn't want to restart it  . Personal history of tobacco use, presenting hazards to health   . Positive PPD    as a child. xray was normal.  . Systemic sclerosis [facility])     Past Surgical History:  Procedure Laterality Date  . APPENDECTOMY  1961  . BREAST BIOPSY Right Nov 2011   benign  . BREAST BIOPSY Left Nov 2012   dilated ducts-benign  . BUNIONECTOMY  2010  . COLONOSCOPY  8/05   Dr. Vira Agar  . COLONOSCOPY WITH PROPOFOL N/A 01/10/2019   Procedure: COLONOSCOPY WITH PROPOFOL;  Surgeon: Virgel Manifold, MD;  Location: [facility] ENDOSCOPY;  Service: Endoscopy;  Laterality: N/A;  . DECOMPRESSIVE LUMBAR LAMINECTOMY  LEVEL 2 N/A 04/30/2015   Procedure: CENTRAL DECOMPRESSIVE LUMBER LAMINECTOMY WITH POSSIBLE FORAMINOTOMIES L3-L4,L4-L5;  Surgeon: Latanya Maudlin, MD;  Location: [facility];  Service: Orthopedics;  Laterality: N/A;  . TONSILLECTOMY  1968  . TUBAL LIGATION  1974    Family History  Problem Relation Age of Onset  . Lung cancer Father   . Heart disease Father 81       Cardiac Arrest  . Heart disease Mother   . Hypertension Mother   . Stroke Mother   . Other Brother        blood clots  . Other Other        hypercoag-family history  . Cancer Maternal Aunt        Breast cancer  . Breast cancer Maternal Aunt 32  . Breast cancer Paternal Aunt 42  . Breast cancer Cousin     SOCIAL HX: married lives with husband   Current Outpatient Medications:  .  hydrochlorothiazide (HYDRODIURIL) 25 MG tablet, Take 0.5 tablets (12.5 mg total) by mouth daily., Disp: 90 tablet, Rfl: 3 .  rosuvastatin (CRESTOR) 10 MG tablet, Take 1 tablet (10 mg total) by mouth daily., Disp: 90 tablet, Rfl: 3  EXAM:  VITALS per patient if applicable:  GENERAL: alert, oriented, appears well and in no acute distress  Lungs speaking in full sentences no sob obvious on the phone   PSYCH/NEURO: pleasant and cooperative, no obvious depression  or anxiety, speech and thought processing grossly intact  ASSESSMENT AND PLAN:  Discussed the following assessment and plan:  Fever, unspecified fever cause ? R/o infection uti, pneumonia covid - Plan: Urinalysis, Routine w reflex microscopic, Urine Culture, DG Chest 2 View, Novel Coronavirus, NAA ([facility]), Lipase If w/u negative consider US abdomen r/o GB issue   Epigastric pain - Plan: Comprehensive metabolic panel, CBC w/Diff, Urinalysis, Routine w reflex microscopic, Urine Culture, DG Chest 2 View, Novel Coronavirus, NAA ([facility]), Lipase  Cough - Plan: DG Chest 2 View r/o pneumonia  Can try otc cough meds for now   Nausea - Plan: Lipase Consider US abdomen in future if w/u  negative   -we discussed possible serious and likely etiologies, options for evaluation and workup, limitations of telemedicine visit vs in person visit, treatment, treatment risks and precautions. Pt prefers to treat via telemedicine empirically rather then risking or undertaking an in person visit at this moment. Patient agrees to seek prompt in person care if worsening, new symptoms arise, or if is not improving with treatment.   I discussed the assessment and treatment plan with the patient. The patient was provided an opportunity to ask questions and all were answered. The patient agreed with the plan and demonstrated an understanding of the instructions.   The patient was advised to call back or seek an in-person evaluation if the symptoms worsen or if the condition fails to improve as anticipated.  Time spent 15 minutes Delorise Jackson, MD

## 2019-09-12 NOTE — Telephone Encounter (Signed)
Pt. Reports she woke up in the middle of the night with a fever - 101.9 and chills. Took aspirin. Came down and went back up to 101. Temp. 99 now. Has a headache, body "feels heavy." Warm transfer to [facility] in the practice for a visit.  Answer Assessment - Initial Assessment Questions 1. TEMPERATURE: "What is the most recent temperature?"  "How was it measured?"      101.9  Then 99 2. ONSET: "When did the fever start?"      Last night 3. SYMPTOMS: "Do you have any other symptoms besides the fever?"  (e.g., colds, headache, sore throat, earache, cough, rash, diarrhea, vomiting, abdominal pain)     Chills, headache 4. CAUSE: If there are no symptoms, ask: "What do you think is causing the fever?"      Unsure 5. CONTACTS: "Does anyone else in the family have an infection?"      No 6. TREATMENT: "What have you done so far to treat this fever?" (e.g., medications)     ASA 7. IMMUNOCOMPROMISE: "Do you have of the following: diabetes, HIV positive, splenectomy, cancer chemotherapy, chronic steroid treatment, transplant patient, etc."     No 8. PREGNANCY: "Is there any chance you are pregnant?" "When was your last menstrual period?"     No 9. TRAVEL: "Have you traveled out of the country in the last month?" (e.g., travel history, exposures)     No  Protocols used: FEVER-A-AH

## 2019-09-13 ENCOUNTER — Other Ambulatory Visit: Payer: Self-pay | Admitting: Internal Medicine

## 2019-09-13 DIAGNOSIS — R509 Fever, unspecified: Secondary | ICD-10-CM

## 2019-09-13 DIAGNOSIS — R8290 Unspecified abnormal findings in urine: Secondary | ICD-10-CM

## 2019-09-13 DIAGNOSIS — R748 Abnormal levels of other serum enzymes: Secondary | ICD-10-CM

## 2019-09-13 DIAGNOSIS — R1013 Epigastric pain: Secondary | ICD-10-CM

## 2019-09-13 MED ORDER — CIPROFLOXACIN HCL 500 MG PO TABS: 500.0000 mg | ORAL_TABLET | Freq: Two times a day (BID) | ORAL | 0 refills | Status: DC

## 2019-09-14 ENCOUNTER — Other Ambulatory Visit: Payer: Self-pay

## 2019-09-14 ENCOUNTER — Other Ambulatory Visit: Payer: Self-pay | Admitting: Internal Medicine

## 2019-09-14 ENCOUNTER — Ambulatory Visit
Admission: RE | Admit: 2019-09-14 | Discharge: 2019-09-14 | Disposition: A | Discharge status: Home / Self Care | Payer: PPO | Source: Ambulatory Visit | Attending: Internal Medicine | Admitting: Internal Medicine

## 2019-09-14 DIAGNOSIS — R1013 Epigastric pain: Secondary | ICD-10-CM | POA: Insufficient documentation

## 2019-09-14 DIAGNOSIS — R748 Abnormal levels of other serum enzymes: Secondary | ICD-10-CM | POA: Diagnosis not present

## 2019-09-14 DIAGNOSIS — K8020 Calculus of gallbladder without cholecystitis without obstruction: Secondary | ICD-10-CM

## 2019-09-14 DIAGNOSIS — R509 Fever, unspecified: Secondary | ICD-10-CM | POA: Diagnosis not present

## 2019-09-14 LAB — URINE CULTURE: Culture: >=100000 — AB

## 2019-09-14 LAB — NOVEL CORONAVIRUS, NAA: SARS-CoV-2, NAA: NOT DETECTED

## 2019-09-18 ENCOUNTER — Other Ambulatory Visit: Payer: Self-pay

## 2019-09-18 ENCOUNTER — Encounter: Payer: Self-pay | Admitting: General Surgery

## 2019-09-18 ENCOUNTER — Ambulatory Visit (INDEPENDENT_AMBULATORY_CARE_PROVIDER_SITE_OTHER): Payer: PPO | Admitting: General Surgery

## 2019-09-18 VITALS — BP 144/85 | HR 75 | Temp 97.5°F | Resp 12 | Ht 62.0 in | Wt 161.2 lb

## 2019-09-18 DIAGNOSIS — K8020 Calculus of gallbladder without cholecystitis without obstruction: Secondary | ICD-10-CM | POA: Diagnosis not present

## 2019-09-18 NOTE — Patient Instructions (Addendum)
Our surgery scheduler will call you within 24-48 hours to schedule your surgery. Please have the Blue surgery sheet available when she calls to speak with you.     You have requested to have your gallbladder removed. This will be done at [facility] with Dr. Celine Ahr.  You will most likely be out of work 1-2 weeks for this surgery. You will return after your post-op appointment with a lifting restriction for approximately 4 more weeks.  You will be able to eat anything you would like to following surgery. But, start by eating a bland diet and advance this as tolerated. The Gallbladder diet is below, please go as closely by this diet as possible prior to surgery to avoid any further attacks.  Please see the (blue)pre-care form that you have been given today. If you have any questions, please call our office.  Laparoscopic Cholecystectomy Laparoscopic cholecystectomy is surgery to remove the gallbladder. The gallbladder is located in the upper right part of the abdomen, behind the liver. It is a storage sac for bile, which is produced in the liver. Bile aids in the digestion and absorption of fats. Cholecystectomy is often done for inflammation of the gallbladder (cholecystitis). This condition is usually caused by a buildup of gallstones (cholelithiasis) in the gallbladder. Gallstones can block the flow of bile, and that can result in inflammation and pain. In severe cases, emergency surgery may be required. If emergency surgery is not required, you will have time to prepare for the procedure. Laparoscopic surgery is an alternative to open surgery. Laparoscopic surgery has a shorter recovery time. Your common bile duct may also need to be examined during the procedure. If stones are found in the common bile duct, they may be removed. LET [facility] CARE PROVIDER KNOW ABOUT:  Any allergies you have.  All medicines you are taking, including vitamins, herbs, eye drops, creams, and  over-the-counter medicines.  Previous problems you or members of your family have had with the use of anesthetics.  Any blood disorders you have.  Previous surgeries you have had.    Any medical conditions you have. RISKS AND COMPLICATIONS Generally, this is a safe procedure. However, problems may occur, including:  Infection.  Bleeding.  Allergic reactions to medicines.  Damage to other structures or organs.  A stone remaining in the common bile duct.  A bile leak from the cyst duct that is clipped when your gallbladder is removed.  The need to convert to open surgery, which requires a larger incision in the abdomen. This may be necessary if your surgeon thinks that it is not safe to continue with a laparoscopic procedure. BEFORE THE PROCEDURE  Ask your health care provider about:  Changing or stopping your regular medicines. This is especially important if you are taking diabetes medicines or blood thinners.  Taking medicines such as aspirin and ibuprofen. These medicines can thin your blood. Do not take these medicines before your procedure if your health care provider instructs you not to.  Follow instructions from your health care provider about eating or drinking restrictions.  Let your health care provider know if you develop a cold or an infection before surgery.  Plan to have someone take you home after the procedure.  Ask your health care provider how your surgical site will be marked or identified.  You may be given antibiotic medicine to help prevent infection. PROCEDURE  To reduce your risk of infection:  Your health care team will wash or sanitize their hands.  Your skin will be washed with soap.  An IV tube may be inserted into one of your veins.  You will be given a medicine to make you fall asleep (general anesthetic).  A breathing tube will be placed in your mouth.  The surgeon will make several small cuts (incisions) in your abdomen.  A  thin, lighted tube (laparoscope) that has a tiny camera on the end will be inserted through one of the small incisions. The camera on the laparoscope will send a picture to a TV screen (monitor) in the operating room. This will give the surgeon a good view inside your abdomen.  A gas will be pumped into your abdomen. This will expand your abdomen to give the surgeon more room to perform the surgery.  Other tools that are needed for the procedure will be inserted through the other incisions. The gallbladder will be removed through one of the incisions.  After your gallbladder has been removed, the incisions will be closed with stitches (sutures), staples, or skin glue.  Your incisions may be covered with a bandage (dressing). The procedure may vary among health care providers and hospitals. AFTER THE PROCEDURE  Your blood pressure, heart rate, breathing rate, and blood oxygen level will be monitored often until the medicines you were given have worn off.  You will be given medicines as needed to control your pain.   This information is not intended to replace advice given to you by your health care provider. Make sure you discuss any questions you have with your health care provider.   Document Released: 11/15/2005 Document Revised: 08/06/2015 Document Reviewed: 06/27/2013 Elsevier Interactive Patient Education 2016 [place] Diet for Gallbladder Conditions A low-fat diet can be helpful if you have pancreatitis or a gallbladder condition. With these conditions, your pancreas and gallbladder have trouble digesting fats. A healthy eating plan with less fat will help rest your pancreas and gallbladder and reduce your symptoms. WHAT DO I NEED TO KNOW ABOUT THIS DIET?  Eat a low-fat diet.  Reduce your fat intake to less than 20-30% of your total daily calories. This is less than 50-60 g of fat per day.  Remember that you need some fat in your diet. Ask your dietician what your  daily goal should be.  Choose nonfat and low-fat healthy foods. Look for the words "nonfat," "low fat," or "fat free."  As a guide, look on the label and choose foods with less than 3 g of fat per serving. Eat only one serving.  Avoid alcohol.  Do not smoke. If you need help quitting, talk with your health care provider.  Eat small frequent meals instead of three large heavy meals. WHAT FOODS CAN I EAT? Grains Include healthy grains and starches such as potatoes, wheat bread, fiber-rich cereal, and brown rice. Choose whole grain options whenever possible. In adults, whole grains should account for 45-65% of your daily calories.  Fruits and Vegetables Eat plenty of fruits and vegetables. Fresh fruits and vegetables add fiber to your diet. Meats and Other Protein Sources Eat lean meat such as chicken and pork. Trim any fat off of meat before cooking it. Eggs, fish, and beans are other sources of protein. In adults, these foods should account for 10-35% of your daily calories. Dairy Choose low-fat milk and dairy options. Dairy includes fat and protein, as well as calcium.  Fats and Oils Limit high-fat foods such as fried foods, sweets, baked goods, sugary drinks.  Other Creamy  sauces and condiments, such as mayonnaise, can add extra fat. Think about whether or not you need to use them, or use smaller amounts or low fat options. WHAT FOODS ARE NOT RECOMMENDED?  High fat foods, such as:  [employer].  Ice cream.  [place] toast.  Sweet rolls.  Pizza.  Cheese bread.  Foods covered with batter, butter, creamy sauces, or cheese.  Fried foods.  Sugary drinks and desserts.  Foods that cause gas or bloating   This information is not intended to replace advice given to you by your health care provider. Make sure you discuss any questions you have with your health care provider.   Document Released: 11/20/2013 Document Reviewed: 11/20/2013 Elsevier Interactive Patient Education  [employer].

## 2019-09-18 NOTE — Progress Notes (Signed)
Patient ID: JASMEET GOUCH, female   DOB: Mar 27, 1946, 73 y.o.   MRN: [id number]  Chief Complaint  Patient presents with  . New Patient (Initial Visit)    Gallstones    HPI Valerie Curry is a 73 y.o. female.   Valerie Curry has been referred by Valerie Curry primary care provider, Dr. Terese Door, for surgical evaluation of symptomatic Curry.  Valerie Curry has kept a detailed record of Valerie Curry episodes which are generally epigastric pain that radiates to Valerie Curry back and right upper quadrant.  These have been accompanied with vomiting.  No specific foods seem to bring the attacks on and the symptoms abate over time, without directed intervention..  The attacks seem to be occurring with increasing frequency and severity.  On October 14, Valerie Curry awoke with fever and chills.  Valerie Curry sought evaluation from Valerie Curry primary care doctor.  Valerie Curry was found to have a urinary tract infection and was treated with a fluoroquinolone.  As part of Valerie Curry evaluation, however, a right upper quadrant ultrasound was obtained.  Valerie demonstrated Curry without evidence of cholecystitis.  Labs showed a normal white blood cell count but mild elevation of the transaminases.  There was no evidence of biliary obstruction on Valerie Curry labs or ultrasound.  Due to Valerie Curry crescendo of symptoms, Valerie Curry is interested in surgical removal of Valerie Curry gallbladder.  Valerie Curry has not had pancreatitis nor has Valerie Curry had jaundice.   Past Medical History:  Diagnosis Date  . Arthritis   . Family history of malignant neoplasm of breast    Maternal/paternal aunts and cousin  . Hypertension   . Lump or mass in breast 2011/2012   benign  . Obesity, unspecified   . Osteopenia    took Boniva 1 1/2 years in past-doesn't want to restart it  . Personal history of tobacco use, presenting hazards to health   . Positive PPD    as a child. xray was normal.  . Systemic sclerosis [facility])     Past Surgical History:  Procedure Laterality Date  . APPENDECTOMY  1961  . BREAST BIOPSY  Right Nov 2011   benign  . BREAST BIOPSY Left Nov 2012   dilated ducts-benign  . BUNIONECTOMY  2010  . COLONOSCOPY  8/05   Dr. Vira Agar  . COLONOSCOPY WITH PROPOFOL N/A 01/10/2019   Procedure: COLONOSCOPY WITH PROPOFOL;  Surgeon: Virgel Manifold, MD;  Location: [facility] ENDOSCOPY;  Service: Endoscopy;  Laterality: N/A;  . DECOMPRESSIVE LUMBAR LAMINECTOMY LEVEL 2 N/A 04/30/2015   Procedure: CENTRAL DECOMPRESSIVE LUMBER LAMINECTOMY WITH POSSIBLE FORAMINOTOMIES L3-L4,L4-L5;  Surgeon: Latanya Maudlin, MD;  Location: [facility];  Service: Orthopedics;  Laterality: N/A;  . TONSILLECTOMY  1968  . TUBAL LIGATION  1974    Family History  Problem Relation Age of Onset  . Lung cancer Father   . Heart disease Father 68       Cardiac Arrest  . Heart disease Mother   . Hypertension Mother   . Stroke Mother   . Other Brother        blood clots  . Other Other        hypercoag-family history  . Cancer Maternal Aunt        Breast cancer  . Breast cancer Maternal Aunt 59  . Breast cancer Paternal Aunt 48  . Breast cancer Cousin     Social History Social History   Tobacco Use  . Smoking status: Former Smoker    Packs/day: 1.00    Years: 50.00    Pack  years: 50.00    Types: Cigarettes    Quit date: 11/29/2014    Years since quitting: 4.8  . Smokeless tobacco: Former Systems developer    Quit date: 04/30/2015  Substance Use Topics  . Alcohol use: Yes    Alcohol/week: 0.0 standard drinks    Comment: occasional glassof wine   . Drug use: No    No Known Allergies  Current Outpatient Medications  Medication Sig Dispense Refill  . hydrochlorothiazide (HYDRODIURIL) 25 MG tablet Take 0.5 tablets (12.5 mg total) by mouth daily. 90 tablet 3  . rosuvastatin (CRESTOR) 10 MG tablet Take 1 tablet (10 mg total) by mouth daily. 90 tablet 3   No current facility-administered medications for Valerie visit.     Review of Systems Review of Systems  Constitutional: Positive for fever.  Gastrointestinal: Positive for  abdominal pain, nausea and vomiting. Negative for constipation and diarrhea.  All other systems reviewed and are negative.   Blood pressure (!) 144/85, pulse 75, temperature (!) 97.5 F (36.4 C), temperature source Temporal, resp. rate 12, height 5\' 2"  (1.575 m), weight 161 lb 3.2 oz (73.1 kg), SpO2 97 %. Body mass index is 29.48 kg/m.  Physical Exam Physical Exam Constitutional:      General: Valerie Curry is not in acute distress.    Appearance: Normal appearance.  HENT:     Head: Normocephalic and atraumatic.     Nose:     Comments: Covered with a mask secondary to COVID-19 precautions    Mouth/Throat:     Comments: Covered with a mask secondary to COVID-19 precautions Eyes:     General: No scleral icterus.       Right eye: No discharge.        Left eye: No discharge.  Neck:     Musculoskeletal: Normal range of motion.     Comments: The thyroid is enlarged.  No dominant masses are palpated in either lobe.  The gland moves freely with deglutition. Cardiovascular:     Rate and Rhythm: Normal rate and regular rhythm.     Pulses: Normal pulses.  Pulmonary:     Effort: Pulmonary effort is normal.     Breath sounds: Normal breath sounds.  Abdominal:     General: Abdomen is flat. Bowel sounds are normal.     Palpations: Abdomen is soft.     Tenderness: There is abdominal tenderness.     Comments: There is mild tenderness to deep palpation in the mid epigastrium and right upper quadrant.  Murphy sign is negative.  There is no rebound, guarding, or other peritoneal signs present.  Genitourinary:    Comments: Deferred Musculoskeletal: Normal range of motion.        General: No swelling or tenderness.  Lymphadenopathy:     Cervical: No cervical adenopathy.  Skin:    General: Skin is warm and dry.  Neurological:     General: No focal deficit present.     Mental Status: Valerie Curry is alert and oriented to person, place, and time.  Psychiatric:        Mood and Affect: Mood normal.         Behavior: Behavior normal.     Data Reviewed I reviewed the labs and ultrasound obtained on October 14 and 16.  As stated above, white count was normal suggesting that there is no active infection.  Transaminases are mildly elevated, but bilirubin and alkaline phosphatase are normal.    Results for AMYTHEST, ORAMA (MRN [id number]) as of 09/18/2019 17:01  Ref. Range 09/12/2019 14:08  Sodium Latest Ref Range: 135 - 145 mmol/L 139  Potassium Latest Ref Range: 3.5 - 5.1 mmol/L 3.5  Chloride Latest Ref Range: 98 - 111 mmol/L 102  CO2 Latest Ref Range: 22 - 32 mmol/L 29  Glucose Latest Ref Range: 70 - 99 mg/dL 105 (H)  BUN Latest Ref Range: 8 - 23 mg/dL 22  Creatinine Latest Ref Range: 0.44 - 1.00 mg/dL 0.77  Calcium Latest Ref Range: 8.9 - 10.3 mg/dL 9.4  Anion gap Latest Ref Range: 5 - 15  8  Alkaline Phosphatase Latest Ref Range: 38 - 126 U/L 73  Albumin Latest Ref Range: 3.5 - 5.0 g/dL 4.1  Lipase Latest Ref Range: 11 - 51 U/L 28  AST Latest Ref Range: 15 - 41 U/L 83 (H)  ALT Latest Ref Range: 0 - 44 U/L 93 (H)  Total Protein Latest Ref Range: 6.5 - 8.1 g/dL 7.2  Total Bilirubin Latest Ref Range: 0.3 - 1.2 mg/dL 0.8  GFR, Est Non African American Latest Ref Range: >60 mL/min >60  GFR, Est African American Latest Ref Range: >60 mL/min >60  WBC Latest Ref Range: 4.0 - 10.5 K/uL 6.8  RBC Latest Ref Range: 3.87 - 5.11 MIL/uL 4.56  Hemoglobin Latest Ref Range: 12.0 - 15.0 g/dL 14.0  HCT Latest Ref Range: 36.0 - 46.0 % 42.7  MCV Latest Ref Range: 80.0 - 100.0 fL 93.6  MCH Latest Ref Range: 26.0 - 34.0 pg 30.7  MCHC Latest Ref Range: 30.0 - 36.0 g/dL 32.8  RDW Latest Ref Range: 11.5 - 15.5 % 13.2  Platelets Latest Ref Range: 150 - 400 K/uL 177  nRBC Latest Ref Range: 0.0 - 0.2 % 0.0    The ultrasound demonstrates the presence of multiple gallstones.  There is no gallbladder wall thickening or pericholecystic fluid.  No biliary dilatation.  The radiologist interpretation is copied below  and I am in agreement with their findings:  CLINICAL DATA:  Epigastric pain and fever with elevated liver function tests  EXAM: ABDOMEN ULTRASOUND COMPLETE  COMPARISON:  None.  FINDINGS: Gallbladder: Shadowing gallstones. The gallbladder is full but there is no focal tenderness or wall thickening.  Common bile duct: Diameter: 7 mm. Incomplete coverage of the common bile duct distally. Where visualized, no filling defect.  Liver: No focal lesion identified. Within normal limits in parenchymal echogenicity. Portal vein is patent on color Doppler imaging with normal direction of blood flow towards the liver.  IVC: No abnormality visualized.  Pancreas: Visualized portion unremarkable.  Spleen: Size and appearance within normal limits.  Right Kidney: Length: 9 cm. Relative size asymmetry is of doubtful significance given there is no superimposed cortical thinning. Echogenicity within normal limits. No mass or hydronephrosis visualized.  Left Kidney: Length: 11 cm. Echogenicity within normal limits. No mass or hydronephrosis visualized.  Abdominal aorta: No aneurysm visualized.  IMPRESSION: Curry without evidence of acute cholecystitis.  Assessment Valerie Curry.  Valerie Curry is having more frequent and more severe episodes.  I have recommended that Valerie Curry undergo laparoscopic cholecystectomy.  Plan I discussed the procedure in detail.  We discussed the risks and benefits of a laparoscopic cholecystectomy and possible cholangiogram including, but not limited to: bleeding, infection, injury to surrounding structures such as the intestine or liver, bile leak, retained gallstones, need to convert to an open procedure, prolonged diarrhea, blood clots such as DVT, common bile duct injury, anesthesia risks, and possible need for additional procedures. The patient  had the opportunity to ask any questions and these were answered to  Valerie Curry.  We will schedule Valerie Curry for an operation at the soonest mutually convenient date.    Fredirick Maudlin 09/18/2019, 4:58 PM

## 2019-09-18 NOTE — H&P (View-Only) (Signed)
Patient ID: Valerie Curry, female   DOB: 1946-05-24, 73 y.o.   MRN: [id number]  Chief Complaint  Patient presents with  . New Patient (Initial Visit)    Gallstones    HPI Valerie Curry is a 73 y.o. female.   She has been referred by her primary care provider, Dr. Terese Door, for surgical evaluation of symptomatic cholelithiasis.  Ms. Macbeth has kept a detailed record of her episodes which are generally epigastric pain that radiates to her back and right upper quadrant.  These have been accompanied with vomiting.  No specific foods seem to bring the attacks on and the symptoms abate over time, without directed intervention..  The attacks seem to be occurring with increasing frequency and severity.  On October 14, Ms. Wilson awoke with fever and chills.  She sought evaluation from her primary care doctor.  She was found to have a urinary tract infection and was treated with a fluoroquinolone.  As part of her evaluation, however, a right upper quadrant ultrasound was obtained.  This demonstrated cholelithiasis without evidence of cholecystitis.  Labs showed a normal white blood cell count but mild elevation of the transaminases.  There was no evidence of biliary obstruction on her labs or ultrasound.  Due to her crescendo of symptoms, she is interested in surgical removal of her gallbladder.  She has not had pancreatitis nor has she had jaundice.   Past Medical History:  Diagnosis Date  . Arthritis   . Family history of malignant neoplasm of breast    Maternal/paternal aunts and cousin  . Hypertension   . Lump or mass in breast 2011/2012   benign  . Obesity, unspecified   . Osteopenia    took Boniva 1 1/2 years in past-doesn't want to restart it  . Personal history of tobacco use, presenting hazards to health   . Positive PPD    as a child. xray was normal.  . Systemic sclerosis [facility])     Past Surgical History:  Procedure Laterality Date  . APPENDECTOMY  1961  . BREAST BIOPSY  Right Nov 2011   benign  . BREAST BIOPSY Left Nov 2012   dilated ducts-benign  . BUNIONECTOMY  2010  . COLONOSCOPY  8/05   Dr. Vira Agar  . COLONOSCOPY WITH PROPOFOL N/A 01/10/2019   Procedure: COLONOSCOPY WITH PROPOFOL;  Surgeon: Virgel Manifold, MD;  Location: [facility] END[facility]COPY;  Service: Endoscopy;  Laterality: N/A;  . DECOMPRESSIVE LUMBAR LAMINECTOMY LEVEL 2 N/A 04/30/2015   Procedure: CENTRAL DECOMPRESSIVE LUMBER LAMINECTOMY WITH POSSIBLE FORAMINOTOMIES L3-L4,L4-L5;  Surgeon: Latanya Maudlin, MD;  Location: [facility];  Service: Orthopedics;  Laterality: N/A;  . TONSILLECTOMY  1968  . TUBAL LIGATION  1974    Family History  Problem Relation Age of Onset  . Lung cancer Father   . Heart disease Father 45       Cardiac Arrest  . Heart disease Mother   . Hypertension Mother   . Stroke Mother   . Other Brother        blood clots  . Other Other        hypercoag-family history  . Cancer Maternal Aunt        Breast cancer  . Breast cancer Maternal Aunt 45  . Breast cancer Paternal Aunt 50  . Breast cancer Cousin     Social History Social History   Tobacco Use  . Smoking status: Former Smoker    Packs/day: 1.00    Years: 50.00    Pack  years: 50.00    Types: Cigarettes    Quit date: 11/29/2014    Years since quitting: 4.8  . Smokeless tobacco: Former Systems developer    Quit date: 04/30/2015  Substance Use Topics  . Alcohol use: Yes    Alcohol/week: 0.0 standard drinks    Comment: occasional glassof wine   . Drug use: No    No Known Allergies  Current Outpatient Medications  Medication Sig Dispense Refill  . hydrochlorothiazide (HYDRODIURIL) 25 MG tablet Take 0.5 tablets (12.5 mg total) by mouth daily. 90 tablet 3  . rosuvastatin (CRESTOR) 10 MG tablet Take 1 tablet (10 mg total) by mouth daily. 90 tablet 3   No current facility-administered medications for this visit.     Review of Systems Review of Systems  Constitutional: Positive for fever.  Gastrointestinal: Positive for  abdominal pain, nausea and vomiting. Negative for constipation and diarrhea.  All other systems reviewed and are negative.   Blood pressure (!) 144/85, pulse 75, temperature (!) 97.5 F (36.4 C), temperature source Temporal, resp. rate 12, height 5\' 2"  (1.575 m), weight 161 lb 3.2 oz (73.1 kg), SpO2 97 %. Body mass index is 29.48 kg/m.  Physical Exam Physical Exam Constitutional:      General: She is not in acute distress.    Appearance: Normal appearance.  HENT:     Head: Normocephalic and atraumatic.     Nose:     Comments: Covered with a mask secondary to COVID-19 precautions    Mouth/Throat:     Comments: Covered with a mask secondary to COVID-19 precautions Eyes:     General: No scleral icterus.       Right eye: No discharge.        Left eye: No discharge.  Neck:     Musculoskeletal: Normal range of motion.     Comments: The thyroid is enlarged.  No dominant masses are palpated in either lobe.  The gland moves freely with deglutition. Cardiovascular:     Rate and Rhythm: Normal rate and regular rhythm.     Pulses: Normal pulses.  Pulmonary:     Effort: Pulmonary effort is normal.     Breath sounds: Normal breath sounds.  Abdominal:     General: Abdomen is flat. Bowel sounds are normal.     Palpations: Abdomen is soft.     Tenderness: There is abdominal tenderness.     Comments: There is mild tenderness to deep palpation in the mid epigastrium and right upper quadrant.  Murphy sign is negative.  There is no rebound, guarding, or other peritoneal signs present.  Genitourinary:    Comments: Deferred Musculoskeletal: Normal range of motion.        General: No swelling or tenderness.  Lymphadenopathy:     Cervical: No cervical adenopathy.  Skin:    General: Skin is warm and dry.  Neurological:     General: No focal deficit present.     Mental Status: She is alert and oriented to person, place, and time.  Psychiatric:        Mood and Affect: Mood normal.         Behavior: Behavior normal.     Data Reviewed I reviewed the labs and ultrasound obtained on October 14 and 16.  As stated above, white count was normal suggesting that there is no active infection.  Transaminases are mildly elevated, but bilirubin and alkaline phosphatase are normal.    Results for EBRU, TRUMBO (MRN [id number]) as of 09/18/2019 17:01  Ref. Range 09/12/2019 14:08  Sodium Latest Ref Range: 135 - 145 mmol/L 139  Potassium Latest Ref Range: 3.5 - 5.1 mmol/L 3.5  Chloride Latest Ref Range: 98 - 111 mmol/L 102  CO2 Latest Ref Range: 22 - 32 mmol/L 29  Glucose Latest Ref Range: 70 - 99 mg/dL 105 (H)  BUN Latest Ref Range: 8 - 23 mg/dL 22  Creatinine Latest Ref Range: 0.44 - 1.00 mg/dL 0.77  Calcium Latest Ref Range: 8.9 - 10.3 mg/dL 9.4  Anion gap Latest Ref Range: 5 - 15  8  Alkaline Phosphatase Latest Ref Range: 38 - 126 U/L 73  Albumin Latest Ref Range: 3.5 - 5.0 g/dL 4.1  Lipase Latest Ref Range: 11 - 51 U/L 28  AST Latest Ref Range: 15 - 41 U/L 83 (H)  ALT Latest Ref Range: 0 - 44 U/L 93 (H)  Total Protein Latest Ref Range: 6.5 - 8.1 g/dL 7.2  Total Bilirubin Latest Ref Range: 0.3 - 1.2 mg/dL 0.8  GFR, Est Non African American Latest Ref Range: >60 mL/min >60  GFR, Est African American Latest Ref Range: >60 mL/min >60  WBC Latest Ref Range: 4.0 - 10.5 K/uL 6.8  RBC Latest Ref Range: 3.87 - 5.11 MIL/uL 4.56  Hemoglobin Latest Ref Range: 12.0 - 15.0 g/dL 14.0  HCT Latest Ref Range: 36.0 - 46.0 % 42.7  MCV Latest Ref Range: 80.0 - 100.0 fL 93.6  MCH Latest Ref Range: 26.0 - 34.0 pg 30.7  MCHC Latest Ref Range: 30.0 - 36.0 g/dL 32.8  RDW Latest Ref Range: 11.5 - 15.5 % 13.2  Platelets Latest Ref Range: 150 - 400 K/uL 177  nRBC Latest Ref Range: 0.0 - 0.2 % 0.0    The ultrasound demonstrates the presence of multiple gallstones.  There is no gallbladder wall thickening or pericholecystic fluid.  No biliary dilatation.  The radiologist interpretation is copied below  and I am in agreement with their findings:  CLINICAL DATA:  Epigastric pain and fever with elevated liver function tests  EXAM: ABDOMEN ULTRASOUND COMPLETE  COMPARISON:  None.  FINDINGS: Gallbladder: Shadowing gallstones. The gallbladder is full but there is no focal tenderness or wall thickening.  Common bile duct: Diameter: 7 mm. Incomplete coverage of the common bile duct distally. Where visualized, no filling defect.  Liver: No focal lesion identified. Within normal limits in parenchymal echogenicity. Portal vein is patent on color Doppler imaging with normal direction of blood flow towards the liver.  IVC: No abnormality visualized.  Pancreas: Visualized portion unremarkable.  Spleen: Size and appearance within normal limits.  Right Kidney: Length: 9 cm. Relative size asymmetry is of doubtful significance given there is no superimposed cortical thinning. Echogenicity within normal limits. No mass or hydronephrosis visualized.  Left Kidney: Length: 11 cm. Echogenicity within normal limits. No mass or hydronephrosis visualized.  Abdominal aorta: No aneurysm visualized.  IMPRESSION: Cholelithiasis without evidence of acute cholecystitis.  Assessment This is a 73 year old woman with symptomatic cholelithiasis.  She is having more frequent and more severe episodes.  I have recommended that she undergo laparoscopic cholecystectomy.  Plan I discussed the procedure in detail.  We discussed the risks and benefits of a laparoscopic cholecystectomy and possible cholangiogram including, but not limited to: bleeding, infection, injury to surrounding structures such as the intestine or liver, bile leak, retained gallstones, need to convert to an open procedure, prolonged diarrhea, blood clots such as DVT, common bile duct injury, anesthesia risks, and possible need for additional procedures. The patient  had the opportunity to ask any questions and these were answered to  her satisfaction.  We will schedule her for an operation at the soonest mutually convenient date.    Fredirick Maudlin 09/18/2019, 4:58 PM

## 2019-09-20 ENCOUNTER — Ambulatory Visit: Payer: PPO | Admitting: Internal Medicine

## 2019-09-20 ENCOUNTER — Other Ambulatory Visit: Payer: Self-pay

## 2019-09-20 ENCOUNTER — Ambulatory Visit: Payer: PPO | Admitting: General Surgery

## 2019-09-20 VITALS — Ht 62.0 in | Wt 158.0 lb

## 2019-09-20 DIAGNOSIS — N3000 Acute cystitis without hematuria: Secondary | ICD-10-CM

## 2019-09-20 DIAGNOSIS — R748 Abnormal levels of other serum enzymes: Secondary | ICD-10-CM

## 2019-09-20 DIAGNOSIS — K8020 Calculus of gallbladder without cholecystitis without obstruction: Secondary | ICD-10-CM

## 2019-09-20 HISTORY — DX: Calculus of gallbladder without cholecystitis without obstruction: K80.20

## 2019-09-20 HISTORY — DX: Abnormal levels of other serum enzymes: R74.8

## 2019-09-20 NOTE — Progress Notes (Signed)
Telephone Note  I connected with  [facility]   on 09/20/19 at 10:25 AM EDT by a telephone and verified that I am speaking with the correct person using two identifiers.  Location patient: home Location provider:work or home office Persons participating in the virtual visit: patient, provider  I discussed the limitations of evaluation and management by telemedicine and the availability of in person appointments. The patient expressed understanding and agreed to proceed.   HPI: 1. UTI improving on Abx  2. Elevated lfts and GS saw surgery 09/17/19 and pending GB removal doing better improved ab pain and fever  Will need to f/u with PCP for elevated lfts  3.covid 19 negative    ROS: See pertinent positives and negatives per HPI.  Past Medical History:  Diagnosis Date  . Arthritis   . Family history of malignant neoplasm of breast    Maternal/paternal aunts and cousin  . Hypertension   . Lump or mass in breast 2011/2012   benign  . Obesity, unspecified   . Osteopenia    took Boniva 1 1/2 years in past-doesn't want to restart it  . Personal history of tobacco use, presenting hazards to health   . Positive PPD    as a child. xray was normal.  . Systemic sclerosis [facility])     Past Surgical History:  Procedure Laterality Date  . APPENDECTOMY  1961  . BREAST BIOPSY Right Nov 2011   benign  . BREAST BIOPSY Left Nov 2012   dilated ducts-benign  . BUNIONECTOMY  2010  . COLONOSCOPY  8/05   Dr. Vira Agar  . COLONOSCOPY WITH PROPOFOL N/A 01/10/2019   Procedure: COLONOSCOPY WITH PROPOFOL;  Surgeon: Virgel Manifold, MD;  Location: [facility] END[facility];  Service: Endoscopy;  Laterality: N/A;  . DECOMPRESSIVE LUMBAR LAMINECTOMY LEVEL 2 N/A 04/30/2015   Procedure: CENTRAL DECOMPRESSIVE LUMBER LAMINECTOMY WITH POSSIBLE FORAMINOTOMIES L3-L4,L4-L5;  Surgeon: Latanya Maudlin, MD;  Location: [facility];  Service: Orthopedics;  Laterality: N/A;  . TONSILLECTOMY  1968  . TUBAL LIGATION  1974     Family History  Problem Relation Age of Onset  . Lung cancer Father   . Heart disease Father 34       Cardiac Arrest  . Heart disease Mother   . Hypertension Mother   . Stroke Mother   . Other Brother        blood clots  . Other Other        hypercoag-family history  . Cancer Maternal Aunt        Breast cancer  . Breast cancer Maternal Aunt 42  . Breast cancer Paternal Aunt 5  . Breast cancer Cousin     SOCIAL HX:  Married    Current Outpatient Medications:  .  hydrochlorothiazide (HYDRODIURIL) 25 MG tablet, Take 0.5 tablets (12.5 mg total) by mouth daily., Disp: 90 tablet, Rfl: 3 .  rosuvastatin (CRESTOR) 10 MG tablet, Take 1 tablet (10 mg total) by mouth daily., Disp: 90 tablet, Rfl: 3  EXAM:  VITALS per patient if applicable:  GENERAL: alert, oriented, appears well and in no acute distress  PSYCH/NEURO: pleasant and cooperative, no obvious depression or anxiety, speech and thought processing grossly intact  ASSESSMENT AND PLAN:  Discussed the following assessment and plan: doing better all sx's  Acute cystitis without hematuria  Elevated liver enzymes likely 2/2 GS will need to have repeated with PCP in future  Calculus of gallbladder without cholecystitis without obstruction, pending surgery  -we discussed possible serious and  likely etiologies, options for evaluation and workup, limitations of telemedicine visit vs in person visit, treatment, treatment risks and precautions. Pt prefers to treat via telemedicine empirically rather then risking or undertaking an in person visit at this moment. Patient agrees to seek prompt in person care if worsening, new symptoms arise, or if is not improving with treatment.   I discussed the assessment and treatment plan with the patient. The patient was provided an opportunity to ask questions and all were answered. The patient agreed with the plan and demonstrated an understanding of the instructions.   The patient was  advised to call back or seek an in-person evaluation if the symptoms worsen or if the condition fails to improve as anticipated.  3-5 minutes  Delorise Jackson, MD

## 2019-09-24 ENCOUNTER — Telehealth: Payer: Self-pay

## 2019-09-24 NOTE — Telephone Encounter (Signed)
Pt has been advised of pre admission date/time, Covid Testing date and Surgery date.  Surgery Date: 10/03/19 Preadmission Testing Date: 09/27/19-phone Covid Testing Date: 09/28/19 - patient advised to go to the [facility]  [employer] Video sent via [employer] [facility] and [employer].  Patient has been made aware to call [phone], between 1-3:00pm the day before surgery, to find out what time to arrive.

## 2019-09-25 ENCOUNTER — Other Ambulatory Visit: Payer: Self-pay | Admitting: General Surgery

## 2019-09-25 DIAGNOSIS — K8020 Calculus of gallbladder without cholecystitis without obstruction: Secondary | ICD-10-CM

## 2019-09-27 ENCOUNTER — Encounter
Admission: RE | Admit: 2019-09-27 | Discharge: 2019-09-27 | Disposition: A | Discharge status: Home / Self Care | Payer: PPO | Source: Ambulatory Visit | Attending: General Surgery | Admitting: General Surgery

## 2019-09-27 ENCOUNTER — Other Ambulatory Visit: Payer: Self-pay

## 2019-09-27 DIAGNOSIS — Z20828 Contact with and (suspected) exposure to other viral communicable diseases: Secondary | ICD-10-CM | POA: Diagnosis not present

## 2019-09-27 DIAGNOSIS — I10 Essential (primary) hypertension: Secondary | ICD-10-CM | POA: Insufficient documentation

## 2019-09-27 DIAGNOSIS — Z01818 Encounter for other preprocedural examination: Secondary | ICD-10-CM | POA: Insufficient documentation

## 2019-09-27 DIAGNOSIS — Z87891 Personal history of nicotine dependence: Secondary | ICD-10-CM | POA: Insufficient documentation

## 2019-09-27 HISTORY — DX: Other specified postprocedural states: Z98.890

## 2019-09-27 HISTORY — DX: Other complications of anesthesia, initial encounter: T88.59XA

## 2019-09-27 HISTORY — DX: Other specified postprocedural states: R11.2

## 2019-09-27 NOTE — Patient Instructions (Signed)
Your procedure is scheduled on: 10-03-19 [facility] Report to Same Day Surgery 2nd floor medical mall [facility] Entrance-take elevator on left to 2nd floor.  Check in with surgery information desk.) To find out your arrival time please call [phone] between 1PM - 3PM on 10-02-19 TUESDAY  Remember: Instructions that are not followed completely may result in serious medical risk, up to and including death, or upon the discretion of your surgeon and anesthesiologist your surgery may need to be rescheduled.    _x___ 1. Do not eat food after midnight the night before your procedure. NO GUM OR CANDY AFTER MIDNIGHT. You may drink clear liquids up to 2 hours before you are scheduled to arrive at the hospital for your procedure.  Do not drink clear liquids within 2 hours of your scheduled arrival to the hospital.  Clear liquids include  --Water or Apple juice without pulp  --Gatorade  --Black Coffee or Clear Tea (No milk, no creamers, do not add anything to the coffee or Tea   ____Ensure clear carbohydrate drink on the way to the hospital for bariatric patients  ____Ensure clear carbohydrate drink 3 hours before surgery.    __x__ 2. No Alcohol for 24 hours before or after surgery.   __x__3. No Smoking or e-cigarettes for 24 prior to surgery.  Do not use any chewable tobacco products for at least 6 hour prior to surgery   ____  4. Bring all medications with you on the day of surgery if instructed.    __x__ 5. Notify your doctor if there is any change in your medical condition     (cold, fever, infections).    x___6. On the morning of surgery brush your teeth with toothpaste and water.  You may rinse your mouth with mouth wash if you wish.  Do not swallow any toothpaste or mouthwash.   Do not wear jewelry, make-up, hairpins, clips or nail polish.  Do not wear lotions, powders, or perfumes.   Do not shave 48 hours prior to surgery. Men may shave face and neck.  Do not bring valuables to  the hospital.    [facility] is not responsible for any belongings or valuables.               Contacts, dentures or bridgework may not be worn into surgery.  Leave your suitcase in the car. After surgery it may be brought to your room.  For patients admitted to the hospital, discharge time is determined by your treatment team.  _  Patients discharged the day of surgery will not be allowed to drive home.  You will need someone to drive you home and stay with you the night of your procedure.    Please read over the following fact sheets that you were given:   [facility] Preparing for Surgery   _x___ TAKE THE FOLLOWING MEDICATION THE MORNING OF SURGERY WITH A SMALL SIP OF WATER. These include:  1. CRESTOR (ROSUVASTATIN)  2.  3.  4.  5.  6.  ____Fleets enema or Magnesium Citrate as directed.   _x___ Use CHG Soap or sage wipes as directed on instruction sheet   ____ Use inhalers on the day of surgery and bring to hospital day of surgery  ____ Stop Metformin and Janumet 2 days prior to surgery.    ____ Take 1/2 of usual insulin dose the night before surgery and none on the morning surgery.   ____ Follow recommendations from Cardiologist, Pulmonologist or PCP regarding  stopping Aspirin, Coumadin, Plavix ,Eliquis, Effient, or Pradaxa, and Pletal.  X____Stop Anti-inflammatories such as Advil, Aleve, Ibuprofen, Motrin, Naproxen, Naprosyn, Goodies powders or aspirin products NOW-OK to take Tylenol    ____ Stop supplements until after surgery.     ____ Bring C-Pap to the hospital.

## 2019-09-28 ENCOUNTER — Other Ambulatory Visit: Admission: RE | Admit: 2019-09-28 | Payer: PPO | Source: Ambulatory Visit

## 2019-09-28 ENCOUNTER — Other Ambulatory Visit: Payer: Self-pay

## 2019-09-28 ENCOUNTER — Telehealth: Payer: Self-pay

## 2019-09-28 ENCOUNTER — Other Ambulatory Visit: Payer: Self-pay | Admitting: Internal Medicine

## 2019-09-28 ENCOUNTER — Encounter
Admission: RE | Admit: 2019-09-28 | Discharge: 2019-09-28 | Disposition: A | Discharge status: Home / Self Care | Payer: PPO | Source: Ambulatory Visit | Attending: General Surgery | Admitting: General Surgery

## 2019-09-28 ENCOUNTER — Telehealth: Payer: Self-pay | Admitting: Family

## 2019-09-28 ENCOUNTER — Telehealth: Payer: Self-pay | Admitting: Cardiology

## 2019-09-28 DIAGNOSIS — Z01818 Encounter for other preprocedural examination: Secondary | ICD-10-CM | POA: Diagnosis not present

## 2019-09-28 DIAGNOSIS — I452 Bifascicular block: Secondary | ICD-10-CM | POA: Insufficient documentation

## 2019-09-28 DIAGNOSIS — R9431 Abnormal electrocardiogram [ECG] [EKG]: Secondary | ICD-10-CM

## 2019-09-28 DIAGNOSIS — Z01810 Encounter for preprocedural cardiovascular examination: Secondary | ICD-10-CM

## 2019-09-28 DIAGNOSIS — I10 Essential (primary) hypertension: Secondary | ICD-10-CM | POA: Diagnosis not present

## 2019-09-28 HISTORY — DX: Bifascicular block: I45.2

## 2019-09-28 LAB — SARS CORONAVIRUS 2 (TAT 6-24 HRS): SARS Coronavirus 2: NEGATIVE

## 2019-09-28 NOTE — Telephone Encounter (Signed)
She will need pre op appt for repeat ekg here and may need a cardiology referral for clearance as well.   I have never personally evaluated this patient so I am not comfortable on just signing off an ekg for surgical clearance.  I cannot promise she will be cleared in time for surgery on 10/03/19 especially if she ends up needing cardiology clearance.

## 2019-09-28 NOTE — Telephone Encounter (Signed)
Our office received clearance stating that patient had an abnormal EKG results from her recent pre-admit appointment. Called patient to notify her of her abnormal EKG results and patient was upset because she states a physician informed her that the her results were normal. Patient was advised that she must schedule an appointment with a Cardiologist to be seen to follow up with her abnormal EKG results. Patient preferred to be seen with [facility]. Patient has a scheduled appointment at 8:40am and patient surgery is scheduled for 10/03/19 with Dr.Cannon. Patient verbalizes understanding.

## 2019-09-28 NOTE — Telephone Encounter (Signed)
° °  [facility] Pre-operative Risk Assessment    Request for surgical clearance:  1. What type of surgery is being performed? Lap chole  2. When is this surgery scheduled? 10/03/19  3. What type of clearance is required (medical clearance vs. Pharmacy clearance to hold med vs. Both)?  Both   4. Are there any medications that need to be held prior to surgery and how long? not noted    5. Practice name and name of physician performing surgery?  [facility]   6. What is your office phone number [phone]   7.   What is your office fax number  [phone]  8.   Anesthesia type (None, local, MAC, general) ? Not noted    Valerie Curry 09/28/2019, 3:43 PM  _________________________________________________________________   (provider comments below)

## 2019-09-28 NOTE — Telephone Encounter (Signed)
Scheduled patient for surgical clearance for Monday.

## 2019-09-28 NOTE — Telephone Encounter (Signed)
09/28/19 - Cardiology referral sent over to Dr.Agbor-Etang at [facility]. Patient has scheduled appointment on 10/01/19 at 9:40am. Patient verbalizes understanding.

## 2019-09-28 NOTE — Telephone Encounter (Signed)
Valerie Curry calling from [employer] calling for the patient. The patient will be have surgery 10/03/2019 removal of galbladder. An abnormal EKG was discovered. Valerie Curry will be faxing the EKG for surgical clearance. Please advise [phone]

## 2019-09-28 NOTE — Pre-Procedure Instructions (Signed)
Abnormal EKG, notified Dr Lavone Neri, anesthesia, who requested a medical clearance.  Notifiied Dr Glenford Peers office and Dr Olivia Mackie McLean's office of the above mentioned by phone and faxed.

## 2019-09-28 NOTE — Telephone Encounter (Signed)
Valerie Curry with [facility] called to inquire if you received clearance.  Hetty Ely of the message below.  Valerie Curry verbalized understanding. Valerie Curry states they will refer pt to a cardiologist and get her appt if you will do your part.

## 2019-09-28 NOTE — Telephone Encounter (Signed)
Patient already has a new patient evaluation by Dr. Kate Sable on Monday for cardiac clearance. Today's EKG showed new LBBB when compare to previous EKG in 2016

## 2019-10-01 ENCOUNTER — Ambulatory Visit: Payer: PPO | Admitting: Family Medicine

## 2019-10-01 ENCOUNTER — Ambulatory Visit (INDEPENDENT_AMBULATORY_CARE_PROVIDER_SITE_OTHER): Payer: PPO | Admitting: Cardiology

## 2019-10-01 ENCOUNTER — Other Ambulatory Visit: Payer: Self-pay | Admitting: General Surgery

## 2019-10-01 ENCOUNTER — Telehealth: Payer: Self-pay

## 2019-10-01 ENCOUNTER — Encounter: Payer: Self-pay | Admitting: Cardiology

## 2019-10-01 ENCOUNTER — Other Ambulatory Visit: Payer: Self-pay

## 2019-10-01 VITALS — BP 122/80 | HR 72 | Ht 62.0 in | Wt 160.5 lb

## 2019-10-01 DIAGNOSIS — Z01810 Encounter for preprocedural cardiovascular examination: Secondary | ICD-10-CM

## 2019-10-01 DIAGNOSIS — R9431 Abnormal electrocardiogram [ECG] [EKG]: Secondary | ICD-10-CM | POA: Diagnosis not present

## 2019-10-01 DIAGNOSIS — I2510 Atherosclerotic heart disease of native coronary artery without angina pectoris: Secondary | ICD-10-CM

## 2019-10-01 DIAGNOSIS — I447 Left bundle-branch block, unspecified: Secondary | ICD-10-CM | POA: Diagnosis not present

## 2019-10-01 DIAGNOSIS — I2584 Coronary atherosclerosis due to calcified coronary lesion: Secondary | ICD-10-CM | POA: Diagnosis not present

## 2019-10-01 NOTE — Progress Notes (Signed)
Cardiology Office Note:    Date:  10/01/2019   ID:  [place], [place] Jan 12, 1946, MRN [id number]  PCP:  Burnard Hawthorne, FNP  Cardiologist:  Kate Sable, MD  Electrophysiologist:  None   Referring MD: Burnard Hawthorne, FNP   Chief Complaint  Patient presents with  . other    Ref by [facility] for abnormal EKG. Meds reviewed by the pt. verbally. Pt. needs to have gallbladder surgery that is scheduled for this Wednesday, Nov. 4th.     History of Present Illness:    Valerie Curry is a 73 y.o. female with a hx of hypertension, , who presents for a preop cardiac evaluation prior to her cholecystectomy.  Patient went to preop me 3 days ago and had an EKG done.  The EKG showed was noted to be abnormal showing a left bundle branch block.  Patient denies any symptoms of chest pain, shortness of breath, edema.  She has frequent abdominal discomfort due to gallstones, surgery is planned for 2 days.  She is a former smoker, quit smoking 17 years ago.  She had a CT screening lung scan due to history of smoking earlier this year which showed aortic calcifications and also calcifications in the LAD and RCA.  She was started on Crestor which she currently takes.  She denies bradycardia, syncope, dizziness.  Past Medical History:  Diagnosis Date  . Arthritis   . Complication of anesthesia    hard to wake up  . Coronary artery disease 11/2018   calcification in LAD and RCA in screening lung scan   . Family history of malignant neoplasm of breast    Maternal/paternal aunts and cousin  . Hypertension   . Lump or mass in breast 2011/2012   benign  . Obesity, unspecified   . Osteopenia    took Boniva 1 1/2 years in past-doesn't want to restart it  . Personal history of tobacco use, presenting hazards to health   . PONV (postoperative nausea and vomiting)   . Positive PPD    as a child. xray was normal.  . Systemic sclerosis [facility])     Past Surgical History:  Procedure  Laterality Date  . APPENDECTOMY  1961  . BREAST BIOPSY Right Nov 2011   benign  . BREAST BIOPSY Left Nov 2012   dilated ducts-benign  . BUNIONECTOMY Bilateral 2010   x 2  . COLONOSCOPY  8/05   Dr. Vira Agar  . COLONOSCOPY WITH PROPOFOL N/A 01/10/2019   Procedure: COLONOSCOPY WITH PROPOFOL;  Surgeon: Virgel Manifold, MD;  Location: [facility] ENDOSCOPY;  Service: Endoscopy;  Laterality: N/A;  . DECOMPRESSIVE LUMBAR LAMINECTOMY LEVEL 2 N/A 04/30/2015   Procedure: CENTRAL DECOMPRESSIVE LUMBER LAMINECTOMY WITH POSSIBLE FORAMINOTOMIES L3-L4,L4-L5;  Surgeon: Latanya Maudlin, MD;  Location: [facility];  Service: Orthopedics;  Laterality: N/A;  . TONSILLECTOMY  1968  . TUBAL LIGATION  1974    Current Medications: Current Meds  Medication Sig  . acetaminophen (TYLENOL) 325 MG tablet Take 650 mg by mouth every 6 (six) hours as needed for moderate pain.  . Calcium Citrate-Vitamin D (CALCIUM CITRATE +D PO) Take 2 tablets by mouth daily.  . Cholecalciferol (VITAMIN D) 50 MCG (2000 UT) CAPS Take 2,000 Units by mouth daily.  . hydrochlorothiazide (HYDRODIURIL) 25 MG tablet Take 0.5 tablets (12.5 mg total) by mouth daily.  . rosuvastatin (CRESTOR) 10 MG tablet Take 1 tablet (10 mg total) by mouth daily. (Patient taking differently: Take 10 mg by mouth every morning. )  Allergies:   Patient has no known allergies.   Social History   Socioeconomic History  . Marital status: Married    Spouse name: Not on file  . Number of children: 2  . Years of education: 28  . Highest education level: Not on file  Occupational History  . Occupation: Facilities manager: [employer]  . Financial resource strain: Not on file  . Food insecurity    Worry: Not on file    Inability: Not on file  . Transportation needs    Medical: Not on file    Non-medical: Not on file  Tobacco Use  . Smoking status: Former Smoker    Packs/day: 1.00    Years: 50.00    Pack years: 50.00     Types: Cigarettes    Quit date: 11/29/2014    Years since quitting: 4.8  . Smokeless tobacco: Former Systems developer    Quit date: 04/30/2015  Substance and Sexual Activity  . Alcohol use: Yes    Alcohol/week: 0.0 standard drinks    Comment: occasional glassof wine   . Drug use: No  . Sexual activity: Yes    Birth control/protection: Post-menopausal  Lifestyle  . Physical activity    Days per week: Not on file    Minutes per session: Not on file  . Stress: Not on file  Relationships  . Social Herbalist on phone: Not on file    Gets together: Not on file    Attends religious service: Not on file    Active member of club or organization: Not on file    Attends meetings of clubs or organizations: Not on file    Relationship status: Not on file  Other Topics Concern  . Not on file  Social History Narrative   Pt is 73yo female. Pt was born in [place], moved to [place] when she was 34.  Pt is married to husband of 22 years.  Pt has 2 sons from a previous marriage and 4 step sons and 6 Grandchildren.        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        Pt works out at gym 3/week (2 days a week in a class, 1 day a week with an Dietitian).  Pt enjoys reading, spending time with her family. Pt and her husband are members of [employer] and active in their church.      Family History: The patient's family history includes Breast cancer in her cousin; Breast cancer (age of onset: 16) in her maternal aunt and paternal aunt; Cancer in her maternal aunt; Heart disease in her mother; Heart disease (age of onset: 31) in her father; Hypertension in her mother; Lung cancer in her father; Other in her brother and another family member; Stroke in her mother.  ROS:   Please see the history of present illness.     All other systems reviewed and are negative.  EKGs/Labs/Other Studies Reviewed:    The following studies were reviewed  today:   EKG:  EKG is  ordered today.  The ekg ordered today demonstrates normal sinus rhythm, left bundle branch block.  Recent Labs: 11/14/2018: TSH 0.82 09/12/2019: ALT 93; BUN 22; Creatinine, Ser 0.77; Hemoglobin 14.0; Platelets 177; Potassium 3.5; Sodium 139  Recent Lipid Panel    Component Value Date/Time   CHOL 160 11/14/2018 0814   TRIG 41.0 11/14/2018 0814  HDL 66.60 11/14/2018 0814   CHOLHDL 2 11/14/2018 0814   VLDL 8.2 11/14/2018 0814   LDLCALC 85 11/14/2018 0814   LDLDIRECT 100.2 07/30/2008 1017    Physical Exam:    VS:  BP 122/80 (BP Location: Right Arm, Patient Position: Sitting, Cuff Size: Normal)   Pulse 72   Ht 5\' 2"  (1.575 m)   Wt 160 lb 8 oz (72.8 kg)   BMI 29.36 kg/m     Wt Readings from Last 3 Encounters:  10/01/19 160 lb 8 oz (72.8 kg)  09/20/19 158 lb (71.7 kg)  09/18/19 161 lb 3.2 oz (73.1 kg)     GEN:  Well nourished, well developed in no acute distress HEENT: Normal NECK: No JVD; No carotid bruits LYMPHATICS: No lymphadenopathy CARDIAC: RRR, no murmurs, rubs, gallops RESPIRATORY:  Clear to auscultation without rales, wheezing or rhonchi  ABDOMEN: Soft, non-tender, non-distended MUSCULOSKELETAL:  No edema; No deformity  SKIN: Warm and dry NEUROLOGIC:  Alert and oriented x 3 PSYCHIATRIC:  Normal affect   ASSESSMENT:   The Revised Cardiac Risk Index indicates that her Perioperative Risk of Major Cardiac Event is (%): 0.4.  Therefore, she is at low risk for perioperative complications.    According to ACC/AHA guidelines, no further cardiovascular testing needed.  The patient may proceed to surgery at acceptable risk.     Patient has no symptoms of chest pain, shortness of breath, dizziness attributable to any conduction defects in light of left bundle branch block.  She can thus proceed for her procedure from a cardiovascular perspective.  Coronary calcifications found on lung scan.  Patient is asymptomatic.  1. Pre-operative cardiovascular  examination   2. Abnormal EKG   3. LBBB (left bundle branch block)   4. Coronary artery calcification    PLAN:    In order of problems listed above:  1. Okay to proceed from a cardiac perspective.  Continue Crestor as currently prescribed.  Recommend aspirin 81 mg daily due to coronary calcifications found on lung scan.  Continue blood pressure meds as currently prescribed.   Total encounter time more than 45 minutes  Greater than 50% was spent in counseling and coordination of care with the patient  This note was generated in part or whole with voice recognition software. Voice recognition is usually quite accurate but there are transcription errors that can and very often do occur. I apologize for any typographical errors that were not detected and corrected.  Medication Adjustments/Labs and Tests Ordered: Current medicines are reviewed at length with the patient today.  Concerns regarding medicines are outlined above.  Orders Placed This Encounter  Procedures  . EKG 12-Lead   No orders of the defined types were placed in this encounter.   Patient Instructions  Medication Instructions:  Your physician recommends that you continue on your current medications as directed. Please refer to the Current Medication list given to you today.   *If you need a refill on your cardiac medications before your next appointment, please call your pharmacy*  Lab Work: None ordered If you have labs (blood work) drawn today and your tests are completely normal, you will receive your results only by: Marland Kitchen MyChart Message (if you have MyChart) OR . A paper copy in the mail If you have any lab test that is abnormal or we need to change your treatment, we will call you to review the results.  Testing/Procedures: None ordered  Follow-Up: At [facility], you and your health needs are our priority.  As part of our continuing mission to provide you with exceptional heart care, we have created  designated Provider Care Teams.  These Care Teams include your primary Cardiologist (physician) and Advanced Practice Providers (APPs -  Physician Assistants and Nurse Practitioners) who all work together to provide you with the care you need, when you need it.  Your next appointment:   12 months  The format for your next appointment:   In Person  Provider:    You may see Kate Sable, MD or one of the following Advanced Practice Providers on your designated Care Team:    Murray Hodgkins, NP  Christell Faith, PA-C  Marrianne Mood, PA-C   Other Instructions N/A     Signed, Kate Sable, MD  10/01/2019 10:09 AM    [place]

## 2019-10-01 NOTE — Telephone Encounter (Signed)
Per Dr. Sander Radon- Okay to proceed from a cardiac perspective.  Continue Crestor as currently prescribed.  Recommend aspirin 81 mg daily due to coronary calcifications found on lung scan.  Continue blood pressure meds as currently prescribed.

## 2019-10-01 NOTE — Patient Instructions (Signed)
Medication Instructions:  Your physician recommends that you continue on your current medications as directed. Please refer to the Current Medication list given to you today.   *If you need a refill on your cardiac medications before your next appointment, please call your pharmacy*  Lab Work: None ordered If you have labs (blood work) drawn today and your tests are completely normal, you will receive your results only by: Marland Kitchen MyChart Message (if you have MyChart) OR . A paper copy in the mail If you have any lab test that is abnormal or we need to change your treatment, we will call you to review the results.  Testing/Procedures: None ordered  Follow-Up: At [facility], you and your health needs are our priority.  As part of our continuing mission to provide you with exceptional heart care, we have created designated Provider Care Teams.  These Care Teams include your primary Cardiologist (physician) and Advanced Practice Providers (APPs -  Physician Assistants and Nurse Practitioners) who all work together to provide you with the care you need, when you need it.  Your next appointment:   12 months  The format for your next appointment:   In Person  Provider:    You may see Kate Sable, MD or one of the following Advanced Practice Providers on your designated Care Team:    Murray Hodgkins, NP  Christell Faith, PA-C  Marrianne Mood, PA-C   Other Instructions N/A

## 2019-10-02 ENCOUNTER — Ambulatory Visit: Payer: PPO | Admitting: Cardiology

## 2019-10-02 NOTE — Pre-Procedure Instructions (Signed)
CARDIAC CLEARANCE IN Epic FROM DR [facility] FROM 10-01-19 VISIT

## 2019-10-03 ENCOUNTER — Ambulatory Visit: Payer: PPO | Admitting: Anesthesiology

## 2019-10-03 ENCOUNTER — Ambulatory Visit
Admission: RE | Admit: 2019-10-03 | Discharge: 2019-10-03 | Disposition: A | Discharge status: Home / Self Care | Payer: PPO | Attending: General Surgery | Admitting: General Surgery

## 2019-10-03 ENCOUNTER — Other Ambulatory Visit: Payer: Self-pay

## 2019-10-03 ENCOUNTER — Encounter: Payer: Self-pay | Admitting: *Deleted

## 2019-10-03 ENCOUNTER — Encounter
Admission: RE | Disposition: A | Discharge status: Home / Self Care | Payer: Self-pay | Source: Home / Self Care | Attending: General Surgery

## 2019-10-03 DIAGNOSIS — E785 Hyperlipidemia, unspecified: Secondary | ICD-10-CM | POA: Diagnosis not present

## 2019-10-03 DIAGNOSIS — K8010 Calculus of gallbladder with chronic cholecystitis without obstruction: Secondary | ICD-10-CM | POA: Diagnosis not present

## 2019-10-03 DIAGNOSIS — Z87891 Personal history of nicotine dependence: Secondary | ICD-10-CM | POA: Diagnosis not present

## 2019-10-03 DIAGNOSIS — Z6829 Body mass index (BMI) 29.0-29.9, adult: Secondary | ICD-10-CM | POA: Insufficient documentation

## 2019-10-03 DIAGNOSIS — Z79899 Other long term (current) drug therapy: Secondary | ICD-10-CM | POA: Diagnosis not present

## 2019-10-03 DIAGNOSIS — M8580 Other specified disorders of bone density and structure, unspecified site: Secondary | ICD-10-CM | POA: Diagnosis not present

## 2019-10-03 DIAGNOSIS — M349 Systemic sclerosis, unspecified: Secondary | ICD-10-CM | POA: Diagnosis not present

## 2019-10-03 DIAGNOSIS — K429 Umbilical hernia without obstruction or gangrene: Secondary | ICD-10-CM | POA: Diagnosis not present

## 2019-10-03 DIAGNOSIS — I447 Left bundle-branch block, unspecified: Secondary | ICD-10-CM | POA: Diagnosis not present

## 2019-10-03 DIAGNOSIS — I10 Essential (primary) hypertension: Secondary | ICD-10-CM | POA: Insufficient documentation

## 2019-10-03 DIAGNOSIS — K8020 Calculus of gallbladder without cholecystitis without obstruction: Secondary | ICD-10-CM | POA: Diagnosis not present

## 2019-10-03 DIAGNOSIS — M1990 Unspecified osteoarthritis, unspecified site: Secondary | ICD-10-CM | POA: Insufficient documentation

## 2019-10-03 DIAGNOSIS — E669 Obesity, unspecified: Secondary | ICD-10-CM | POA: Diagnosis not present

## 2019-10-03 DIAGNOSIS — Z8249 Family history of ischemic heart disease and other diseases of the circulatory system: Secondary | ICD-10-CM | POA: Diagnosis not present

## 2019-10-03 HISTORY — PX: CHOLECYSTECTOMY: SHX55

## 2019-10-03 SURGERY — LAPAROSCOPIC CHOLECYSTECTOMY
Anesthesia: General | Site: Abdomen

## 2019-10-03 MED ORDER — GABAPENTIN 300 MG PO CAPS
300.0000 mg | ORAL_CAPSULE | ORAL | Status: AC
  Administered 2019-10-03: 300 mg via ORAL

## 2019-10-03 MED ORDER — DEXAMETHASONE SODIUM PHOSPHATE 10 MG/ML IJ SOLN
INTRAMUSCULAR | Status: DC | PRN
  Administered 2019-10-03: 10 mg via INTRAVENOUS

## 2019-10-03 MED ORDER — LACTATED RINGERS IV SOLN
INTRAVENOUS | Status: DC | PRN
  Administered 2019-10-03: 08:00:00 via INTRAVENOUS

## 2019-10-03 MED ORDER — CELECOXIB 200 MG PO CAPS
200.0000 mg | ORAL_CAPSULE | ORAL | Status: AC
  Administered 2019-10-03: 200 mg via ORAL

## 2019-10-03 MED ORDER — LIDOCAINE HCL (CARDIAC) PF 100 MG/5ML IV SOSY
PREFILLED_SYRINGE | INTRAVENOUS | Status: DC | PRN
  Administered 2019-10-03: 100 mg via INTRAVENOUS

## 2019-10-03 MED ORDER — SUGAMMADEX SODIUM 200 MG/2ML IV SOLN: INTRAVENOUS | Status: DC | PRN

## 2019-10-03 MED ORDER — ROCURONIUM BROMIDE 100 MG/10ML IV SOLN
INTRAVENOUS | Status: DC | PRN
  Administered 2019-10-03: 10 mg via INTRAVENOUS
  Administered 2019-10-03: 20 mg via INTRAVENOUS
  Administered 2019-10-03: 40 mg via INTRAVENOUS

## 2019-10-03 MED ORDER — FENTANYL CITRATE (PF) 100 MCG/2ML IJ SOLN
INTRAMUSCULAR | Status: DC | PRN
  Administered 2019-10-03: 100 ug via INTRAVENOUS
  Administered 2019-10-03: 50 ug via INTRAVENOUS

## 2019-10-03 MED ORDER — MIDAZOLAM HCL 2 MG/2ML IJ SOLN
INTRAMUSCULAR | Status: DC | PRN
  Administered 2019-10-03: 2 mg via INTRAVENOUS

## 2019-10-03 MED ORDER — CHLORHEXIDINE GLUCONATE CLOTH 2 % EX PADS: 6.0000 | MEDICATED_PAD | Freq: Once | CUTANEOUS | Status: DC

## 2019-10-03 MED ORDER — MIDAZOLAM HCL 2 MG/2ML IJ SOLN
INTRAMUSCULAR | Status: AC
  Filled 2019-10-03: qty 2

## 2019-10-03 MED ORDER — FAMOTIDINE 20 MG PO TABS: 20.0000 mg | ORAL_TABLET | Freq: Once | ORAL | Status: DC

## 2019-10-03 MED ORDER — CEFAZOLIN SODIUM-DEXTROSE 2-4 GM/100ML-% IV SOLN
INTRAVENOUS | Status: AC
  Filled 2019-10-03: qty 100

## 2019-10-03 MED ORDER — FENTANYL CITRATE (PF) 250 MCG/5ML IJ SOLN
INTRAMUSCULAR | Status: AC
  Filled 2019-10-03: qty 5

## 2019-10-03 MED ORDER — CELECOXIB 200 MG PO CAPS
ORAL_CAPSULE | ORAL | Status: AC
  Filled 2019-10-03: qty 1

## 2019-10-03 MED ORDER — ACETAMINOPHEN 500 MG PO TABS
1000.0000 mg | ORAL_TABLET | ORAL | Status: AC
  Administered 2019-10-03: 1000 mg via ORAL

## 2019-10-03 MED ORDER — SUGAMMADEX SODIUM 500 MG/5ML IV SOLN
INTRAVENOUS | Status: DC | PRN
  Administered 2019-10-03: 200 mg via INTRAVENOUS

## 2019-10-03 MED ORDER — PHENYLEPHRINE HCL (PRESSORS) 10 MG/ML IV SOLN
INTRAVENOUS | Status: DC | PRN
  Administered 2019-10-03: 200 ug via INTRAVENOUS
  Administered 2019-10-03: 100 ug via INTRAVENOUS

## 2019-10-03 MED ORDER — FENTANYL CITRATE (PF) 100 MCG/2ML IJ SOLN
INTRAMUSCULAR | Status: AC
  Administered 2019-10-03: 25 ug via INTRAVENOUS
  Filled 2019-10-03: qty 2

## 2019-10-03 MED ORDER — FENTANYL CITRATE (PF) 100 MCG/2ML IJ SOLN
25.0000 ug | INTRAMUSCULAR | Status: DC | PRN
  Administered 2019-10-03 (×3): 25 ug via INTRAVENOUS

## 2019-10-03 MED ORDER — PROPOFOL 500 MG/50ML IV EMUL
INTRAVENOUS | Status: AC
  Filled 2019-10-03: qty 50

## 2019-10-03 MED ORDER — ONDANSETRON 4 MG PO TBDP: 4.0000 mg | ORAL_TABLET | Freq: Three times a day (TID) | ORAL | 0 refills | Status: DC | PRN

## 2019-10-03 MED ORDER — FAMOTIDINE 20 MG PO TABS
ORAL_TABLET | ORAL | Status: AC
  Administered 2019-10-03: 08:00:00 20 mg
  Filled 2019-10-03: qty 1

## 2019-10-03 MED ORDER — ONDANSETRON HCL 4 MG/2ML IJ SOLN
INTRAMUSCULAR | Status: DC | PRN
  Administered 2019-10-03: 4 mg via INTRAVENOUS

## 2019-10-03 MED ORDER — PROPOFOL 10 MG/ML IV BOLUS
INTRAVENOUS | Status: AC
  Filled 2019-10-03: qty 20

## 2019-10-03 MED ORDER — ONDANSETRON HCL 4 MG/2ML IJ SOLN
INTRAMUSCULAR | Status: AC
  Filled 2019-10-03: qty 2

## 2019-10-03 MED ORDER — PROPOFOL 500 MG/50ML IV EMUL
INTRAVENOUS | Status: DC | PRN
  Administered 2019-10-03: 150 ug/kg/min via INTRAVENOUS

## 2019-10-03 MED ORDER — CEFAZOLIN SODIUM-DEXTROSE 2-4 GM/100ML-% IV SOLN: 2.0000 g | INTRAVENOUS | Status: DC

## 2019-10-03 MED ORDER — ACETAMINOPHEN 500 MG PO TABS
ORAL_TABLET | ORAL | Status: AC
  Filled 2019-10-03: qty 2

## 2019-10-03 MED ORDER — IBUPROFEN 800 MG PO TABS: 800.0000 mg | ORAL_TABLET | Freq: Three times a day (TID) | ORAL | 0 refills | Status: DC | PRN

## 2019-10-03 MED ORDER — LIDOCAINE-EPINEPHRINE 1 %-1:100000 IJ SOLN
INTRAMUSCULAR | Status: AC
  Filled 2019-10-03: qty 1

## 2019-10-03 MED ORDER — GABAPENTIN 300 MG PO CAPS
ORAL_CAPSULE | ORAL | Status: AC
  Filled 2019-10-03: qty 1

## 2019-10-03 MED ORDER — PROPOFOL 10 MG/ML IV BOLUS
INTRAVENOUS | Status: DC | PRN
  Administered 2019-10-03: 130 mg via INTRAVENOUS
  Administered 2019-10-03: 30 mg via INTRAVENOUS

## 2019-10-03 MED ORDER — LACTATED RINGERS IV SOLN: INTRAVENOUS | Status: DC

## 2019-10-03 MED ORDER — PROMETHAZINE HCL 25 MG/ML IJ SOLN: 6.2500 mg | INTRAMUSCULAR | Status: DC | PRN

## 2019-10-03 MED ORDER — BUPIVACAINE HCL (PF) 0.25 % IJ SOLN
INTRAMUSCULAR | Status: AC
  Filled 2019-10-03: qty 30

## 2019-10-03 MED ORDER — HEMOSTATIC AGENTS (NO CHARGE) OPTIME
TOPICAL | Status: DC | PRN
  Administered 2019-10-03: 1

## 2019-10-03 MED ORDER — LIDOCAINE-EPINEPHRINE 1 %-1:100000 IJ SOLN
INTRAMUSCULAR | Status: DC | PRN
  Administered 2019-10-03: 26 mL via SUBCUTANEOUS

## 2019-10-03 MED ORDER — OXYCODONE HCL 5 MG PO TABS: 5.0000 mg | ORAL_TABLET | Freq: Four times a day (QID) | ORAL | 0 refills | Status: DC | PRN

## 2019-10-03 SURGICAL SUPPLY — 48 items
APPLIER CLIP 5 13 M/L LIGAMAX5 (MISCELLANEOUS) ×2
BLADE SURG SZ11 CARB STEEL (BLADE) ×2 IMPLANT
CANISTER SUCT 1200ML W/VALVE (MISCELLANEOUS) ×2 IMPLANT
CHLORAPREP W/TINT 26 (MISCELLANEOUS) ×2 IMPLANT
CLIP APPLIE 5 13 M/L LIGAMAX5 (MISCELLANEOUS) ×1 IMPLANT
COVER WAND RF STERILE (DRAPES) ×2 IMPLANT
DECANTER SPIKE VIAL GLASS SM (MISCELLANEOUS) ×3 IMPLANT
DEFOGGER SCOPE WARMER CLEARIFY (MISCELLANEOUS) ×2 IMPLANT
DERMABOND ADVANCED (GAUZE/BANDAGES/DRESSINGS) ×1
DERMABOND ADVANCED .7 DNX12 (GAUZE/BANDAGES/DRESSINGS) ×1 IMPLANT
DEVICE SUTURE ENDOST 10MM (ENDOMECHANICALS) ×1 IMPLANT
DISSECTOR BLUNT TIP ENDO 5MM (MISCELLANEOUS) ×1 IMPLANT
ELECT CAUTERY BLADE TIP 2.5 (TIP) ×2
ELECT REM PT RETURN 9FT ADLT (ELECTROSURGICAL) ×2
ELECTRODE CAUTERY BLDE TIP 2.5 (TIP) ×1 IMPLANT
ELECTRODE REM PT RTRN 9FT ADLT (ELECTROSURGICAL) ×1 IMPLANT
GLOVE BIO SURGEON STRL SZ 6.5 (GLOVE) ×2 IMPLANT
GLOVE INDICATOR 7.0 STRL GRN (GLOVE) ×4 IMPLANT
GOWN STRL REUS W/ TWL LRG LVL3 (GOWN DISPOSABLE) ×3 IMPLANT
GOWN STRL REUS W/TWL LRG LVL3 (GOWN DISPOSABLE) ×3
GRASPER SUT TROCAR 14GX15 (MISCELLANEOUS) ×1 IMPLANT
HEMOSTAT SURGICEL 2X3 (HEMOSTASIS) ×1 IMPLANT
IRRIGATION STRYKERFLOW (MISCELLANEOUS) ×1 IMPLANT
IRRIGATOR STRYKERFLOW (MISCELLANEOUS) ×2
IV NS 1000ML (IV SOLUTION) ×1
IV NS 1000ML BAXH (IV SOLUTION) ×1 IMPLANT
KIT TURNOVER KIT A (KITS) ×2 IMPLANT
LABEL OR SOLS (LABEL) ×2 IMPLANT
NEEDLE HYPO 22GX1.5 SAFETY (NEEDLE) ×2 IMPLANT
NS IRRIG 500ML POUR BTL (IV SOLUTION) ×2 IMPLANT
PACK LAP CHOLECYSTECTOMY (MISCELLANEOUS) ×2 IMPLANT
PENCIL ELECTRO HAND CTR (MISCELLANEOUS) ×2 IMPLANT
POUCH SPECIMEN RETRIEVAL 10MM (ENDOMECHANICALS) ×2 IMPLANT
SCISSORS METZENBAUM CVD 33 (INSTRUMENTS) ×2 IMPLANT
SET TUBE SMOKE EVAC HIGH FLOW (TUBING) ×2 IMPLANT
SLEEVE ADV FIXATION 5X100MM (TROCAR) ×4 IMPLANT
SOLUTION ELECTROLUBE (MISCELLANEOUS) ×2 IMPLANT
STRIP CLOSURE SKIN 1/2X4 (GAUZE/BANDAGES/DRESSINGS) ×2 IMPLANT
SUT ENDOS POLY 0 UNDYED ES-9 (SUTURE) ×1 IMPLANT
SUT MNCRL 4-0 (SUTURE) ×1
SUT MNCRL 4-0 27XMFL (SUTURE) ×1
SUT VIC AB 3-0 SH 27 (SUTURE) ×1
SUT VIC AB 3-0 SH 27X BRD (SUTURE) ×1 IMPLANT
SUT VICRYL 0 AB UR-6 (SUTURE) ×4 IMPLANT
SUTURE MNCRL 4-0 27XMF (SUTURE) ×1 IMPLANT
TROCAR ADV FIXATION 12X100MM (TROCAR) ×2 IMPLANT
TROCAR Z-THREAD OPTICAL 5X100M (TROCAR) ×2 IMPLANT
WATER STERILE IRR 1000ML POUR (IV SOLUTION) ×2 IMPLANT

## 2019-10-03 NOTE — Anesthesia Procedure Notes (Addendum)
Procedure Name: Intubation Date/Time: 10/03/2019 8:43 AM Performed by: Justus Memory, CRNA Pre-anesthesia Checklist: Patient identified, Patient being monitored, Timeout performed, Emergency Drugs available and Suction available Patient Re-evaluated:Patient Re-evaluated prior to induction Oxygen Delivery Method: Circle system utilized Preoxygenation: Pre-oxygenation with 100% oxygen Induction Type: IV induction Ventilation: Mask ventilation without difficulty Laryngoscope Size: Mac, 3 and McGraph Grade View: Grade I Tube type: Oral Tube size: 7.0 mm Number of attempts: 1 Airway Equipment and Method: Stylet,  Video-laryngoscopy,  Rigid stylet and Patient positioned with wedge pillow Placement Confirmation: ETT inserted through vocal cords under direct vision,  positive ETCO2 and breath sounds checked- equal and bilateral Secured at: 21 cm Tube secured with: Tape Dental Injury: Teeth and Oropharynx as per pre-operative assessment

## 2019-10-03 NOTE — Transfer of Care (Signed)
Immediate Anesthesia Transfer of Care Note  Patient: Valerie Curry  Procedure(s) Performed: LAPAROSCOPIC CHOLECYSTECTOMY (N/A Abdomen)  Patient Location: PACU  Anesthesia Type:General  Level of Consciousness: sedated  Airway & Oxygen Therapy: Patient Spontanous Breathing and Patient connected to face mask oxygen  Post-op Assessment: Report given to RN and Post -op Vital signs reviewed and stable  Post vital signs: Reviewed and stable  Last Vitals:  Vitals Value Taken Time  BP 126/63 10/03/19 1048  Temp    Pulse 65 10/03/19 1050  Resp 9 10/03/19 1050  SpO2 100 % 10/03/19 1050  Vitals shown include unvalidated device data.  Last Pain:  Vitals:   10/03/19 1049  TempSrc:   PainSc: (P) Asleep         Complications: No apparent anesthesia complications

## 2019-10-03 NOTE — Anesthesia Post-op Follow-up Note (Signed)
Anesthesia QCDR form completed.        

## 2019-10-03 NOTE — Anesthesia Preprocedure Evaluation (Signed)
Anesthesia Evaluation  Patient identified by MRN, date of birth, ID band Patient awake    Reviewed: Allergy & Precautions, NPO status , Patient's Chart, lab work & pertinent test results, reviewed documented beta blocker date and time   History of Anesthesia Complications (+) PONV, PROLONGED EMERGENCE and history of anesthetic complications  Airway Mallampati: II  TM Distance: >3 FB     Dental  (+) Chipped, Dental Advidsory Given   Pulmonary neg pulmonary ROS, former smoker,           Cardiovascular hypertension, Pt. on medications (-) angina+ CAD  (-) Past MI, (-) Cardiac Stents and (-) CABG + dysrhythmias (LBBB) (-) Valvular Problems/Murmurs     Neuro/Psych negative neurological ROS  negative psych ROS   GI/Hepatic negative GI ROS, Neg liver ROS,   Endo/Other  negative endocrine ROS  Renal/GU negative Renal ROS     Musculoskeletal  (+) Arthritis ,   Abdominal   Peds  Hematology negative hematology ROS (+)   Anesthesia Other Findings Past Medical History: No date: Arthritis No date: Complication of anesthesia     Comment:  hard to wake up 11/2018: Coronary artery disease     Comment:  calcification in LAD and RCA in screening lung scan  No date: Family history of malignant neoplasm of breast     Comment:  Maternal/paternal aunts and cousin No date: Hypertension 2011/2012: Lump or mass in breast     Comment:  benign No date: Obesity, unspecified No date: Osteopenia     Comment:  took Boniva 1 1/2 years in past-doesn't want to restart               it No date: Personal history of tobacco use, presenting hazards to health No date: PONV (postoperative nausea and vomiting) No date: Positive PPD     Comment:  as a child. xray was normal. No date: Systemic sclerosis (HCC)   Reproductive/Obstetrics                             Anesthesia Physical  Anesthesia Plan  ASA:  III  Anesthesia Plan: General   Post-op Pain Management:    Induction: Intravenous  PONV Risk Score and Plan: 4 or greater and Propofol infusion, TIVA and Midazolam  Airway Management Planned: Oral ETT  Additional Equipment:   Intra-op Plan:   Post-operative Plan: Extubation in OR  Informed Consent: I have reviewed the patients History and Physical, chart, labs and discussed the procedure including the risks, benefits and alternatives for the proposed anesthesia with the patient or authorized representative who has indicated his/her understanding and acceptance.       Plan Discussed with: CRNA  Anesthesia Plan Comments:         Anesthesia Quick Evaluation

## 2019-10-03 NOTE — Op Note (Signed)
Laparoscopic Cholecystectomy  Pre-operative Diagnosis: Symptomatic cholelithiasis  Post-operative Diagnosis: Same, umbilical hernia  Procedure: Laparoscopic cholecystectomy and primary suture repair of umbilical hernia.  Surgeon: Fredirick Maudlin, MD  Anesthesia: GETA  Assistant: None   Findings: There were moderate adhesions throughout the abdomen from the patient's prior appendectomy.  The inferior vena cava was very full and abutted the neck of the gallbladder next to the cystic duct.  There was no inflammatory change suggesting acute cholecystitis.  There was a serosal injury to the stomach from the Optiview trocar placement.  This did not appear to be full-thickness and there was no blood seen in the orogastric tube.  The small defect in the serosa was repaired with a 0 Vicryl stitch.  Estimated Blood Loss: Less than 5 cc         Drains: None         Specimens: Gallbladder           Complications: none   Procedure Details  The patient was seen again in the preoperative holding area. The benefits, complications, treatment options, and expected outcomes were discussed with the patient. The risks of bleeding, infection, recurrence of symptoms, failure to resolve symptoms, bile duct damage, bile duct leak, retained common bile duct stone, bowel injury, any of which could require further surgery and/or ERCP, stent, or papillotomy were reviewed with the patient. The likelihood of improving the patient's symptoms with return to their baseline status is good.  The patient and/or family concurred with the proposed plan, giving informed consent.  The patient was taken to operating room, identified as Valerie Curry and the procedure verified as Laparoscopic Cholecystectomy. A time out was performed and the above information confirmed.  Prior to the induction of general anesthesia, antibiotic prophylaxis was administered. VTE prophylaxis was in place. General endotracheal anesthesia was then  administered and tolerated well. After the induction, the abdomen was prepped with Chloraprep and draped in the sterile fashion. The patient was positioned in the supine position.  Optiview technique was used to enter the abdominal cavity via a 5 mm trocar in the right upper quadrant.  Pneumoperitoneum was then created with CO2 and tolerated well without any adverse changes in the patient's vital signs.  A 12 mm periumbilical port was placed along with 2 additional 5-mm ports in the right upper quadrant all under direct vision.  Prior to placing the periumbilical port, a small umbilical hernia was appreciated and the trocar was placed directly through this defect.  All skin incisions  were infiltrated with a local anesthetic agent before making the incision and placing the trocars.  Upon inspection of the abdomen, there was a small amount of blood in the gastric body.  This was irrigated and suctioned free and appeared to represent a small serosal injury.  An orogastric tube was placed with return of gastric contents and no blood.  I elected to primarily repair the small serosal tear using an 0 Vicryl suture and the Endo Stitch device.  Once this was completed, we then proceeded with the cholecystectomy.  The patient was positioned  in reverse Trendelenburg, tilted slightly to the patient's left.  The gallbladder was identified, the fundus grasped and retracted cephalad. Adhesions were lysed bluntly. The infundibulum was grasped and retracted laterally, exposing the peritoneum overlying the triangle of Calot. This was then divided and exposed in a blunt fashion. An extended critical view of the cystic duct and cystic artery was obtained.  The cystic duct was clearly identified and  bluntly dissected free. Both the cystic artery and duct were double clipped and divided.  The gallbladder was taken from the gallbladder fossa in a retrograde fashion with the electrocautery. The gallbladder was removed and placed in  an Endo pouch bag. The liver bed was irrigated and inspected. Hemostasis was achieved with the electrocautery. Copious saline irrigation was utilized and was repeatedly aspirated until clear.  The gallbladder and Endo pouch sac were then removed through a port site.   I inspected the gastric body once again and the site appeared completely closed with a suture. There was a small amount of oozing, over which I placed a piece of Surgicel.  Reinspection a few minutes later demonstrated good hemostasis.  Inspection of the right upper quadrant was performed. No bleeding, bile duct injury or leak, or further bowel injury was noted. Pneumoperitoneum was released.  The periumbilical port site and is associated umbilical hernia was closed with interrumpted 0 Vicryl sutures. 4-0 subcuticular Monocryl was used to close the skin. Dermabond was applied, followed by Steri-Strips. the patient was then extubated and brought to the recovery room in stable condition. Sponge, lap, and needle counts were correct at closure and at the conclusion of the case.               Fredirick Maudlin, MD, FACS

## 2019-10-03 NOTE — Discharge Instructions (Signed)

## 2019-10-03 NOTE — Interval H&P Note (Signed)
History and Physical Interval Note:  10/03/2019 8:15 AM  Valerie Curry  has presented today for surgery, with the diagnosis of cholelithiasis.  The various methods of treatment have been discussed with the patient and family. After consideration of risks, benefits and other options for treatment, the patient has consented to  Procedure(s): LAPAROSCOPIC CHOLECYSTECTOMY (N/A) as a surgical intervention.  The patient's history has been reviewed, patient examined, no change in status, stable for surgery.  I have reviewed the patient's chart and labs.  Questions were answered to the patient's satisfaction.     Fredirick Maudlin

## 2019-10-04 LAB — SURGICAL PATHOLOGY

## 2019-10-04 NOTE — Anesthesia Postprocedure Evaluation (Signed)
Anesthesia Post Note  Patient: MALAYSHA TKACZYK  Procedure(s) Performed: LAPAROSCOPIC CHOLECYSTECTOMY (N/A Abdomen)  Patient location during evaluation: PACU Anesthesia Type: General Level of consciousness: awake and alert Pain management: pain level controlled Vital Signs Assessment: post-procedure vital signs reviewed and stable Respiratory status: spontaneous breathing, nonlabored ventilation, respiratory function stable and patient connected to nasal cannula oxygen Cardiovascular status: blood pressure returned to baseline and stable Postop Assessment: no apparent nausea or vomiting Anesthetic complications: no     Last Vitals:  Vitals:   10/03/19 1259 10/03/19 1355  BP: 135/67 139/89  Pulse: 67 76  Resp: 16 16  Temp:    SpO2: 95% 98%    Last Pain:  Vitals:   10/04/19 1006  TempSrc:   PainSc: 2                  Martha Clan

## 2019-10-23 ENCOUNTER — Encounter: Payer: Self-pay | Admitting: General Surgery

## 2019-10-23 ENCOUNTER — Other Ambulatory Visit: Payer: Self-pay

## 2019-10-23 ENCOUNTER — Ambulatory Visit (INDEPENDENT_AMBULATORY_CARE_PROVIDER_SITE_OTHER): Payer: PPO | Admitting: General Surgery

## 2019-10-23 VITALS — BP 149/85 | HR 76 | Temp 96.9°F | Resp 20 | Ht 62.5 in | Wt 161.6 lb

## 2019-10-23 DIAGNOSIS — Z9049 Acquired absence of other specified parts of digestive tract: Secondary | ICD-10-CM

## 2019-10-23 NOTE — Patient Instructions (Signed)

## 2019-10-23 NOTE — Progress Notes (Signed)
[facility] is here today for a postoperative visit.  She had an uncomplicated laparoscopic cholecystectomy on October 03, 2019.  She states that about 1 week after her surgery, she had an episode of pain in the epigastric area that felt similar to her prior gallbladder attacks.  This went away on its own and she has not experienced any further episodes.  She denies any fevers or chills.  No nausea or vomiting.  She has had occasional loose stools, but this is normal from her preop status.  Her pain has been well controlled with ibuprofen.  She also did not experience any of her typical postop nausea and vomiting from her anesthetic agents.  Today's Vitals   10/23/19 0908  BP: (!) 149/85  Pulse: 76  Resp: 20  Temp: (!) 96.9 F (36.1 C)  TempSrc: Temporal  SpO2: 98%  Weight: 161 lb 9.6 oz (73.3 kg)  Height: 5' 2.5" (1.588 m)  PainSc: 0-No pain   Body mass index is 29.09 kg/m. Focused exam: Her surgical port sites are all healing nicely.  They are well approximated without any erythema, induration, or drainage. Impression and plan: This is a 73 year old woman who had a laparoscopic cholecystectomy for symptomatic cholelithiasis.  She has done well in the postoperative period.  She may resume all of her usual activities without restrictions.  We will see her on an as-needed basis.

## 2019-10-24 ENCOUNTER — Ambulatory Visit: Payer: PPO | Admitting: Gastroenterology

## 2019-11-09 ENCOUNTER — Other Ambulatory Visit: Payer: Self-pay

## 2019-11-09 ENCOUNTER — Encounter: Payer: Self-pay | Admitting: Family

## 2019-11-09 DIAGNOSIS — Z0000 Encounter for general adult medical examination without abnormal findings: Secondary | ICD-10-CM

## 2019-11-12 ENCOUNTER — Telehealth: Payer: Self-pay

## 2019-11-12 NOTE — Telephone Encounter (Signed)
Copied from [place] [id number]. Topic: General - Other >> Nov 12, 2019 12:21 PM Burchel, Abbi R wrote: Reason for CRM: Pt returning call to [place], please call pt: [phone]

## 2019-11-12 NOTE — Telephone Encounter (Signed)
I called patient & it appears that Valerie Curry had called to screen her for lab appointment, Pt passed screening with no symptoms, exposures nor has been tested last 21 days. Pt will wear mask.

## 2019-11-13 DIAGNOSIS — M1710 Unilateral primary osteoarthritis, unspecified knee: Secondary | ICD-10-CM | POA: Insufficient documentation

## 2019-11-13 DIAGNOSIS — M179 Osteoarthritis of knee, unspecified: Secondary | ICD-10-CM | POA: Insufficient documentation

## 2019-11-13 DIAGNOSIS — M1711 Unilateral primary osteoarthritis, right knee: Secondary | ICD-10-CM | POA: Diagnosis not present

## 2019-11-13 DIAGNOSIS — M79644 Pain in right finger(s): Secondary | ICD-10-CM | POA: Diagnosis not present

## 2019-11-13 DIAGNOSIS — M25541 Pain in joints of right hand: Secondary | ICD-10-CM | POA: Insufficient documentation

## 2019-11-14 ENCOUNTER — Other Ambulatory Visit: Payer: PPO

## 2019-11-14 DIAGNOSIS — M179 Osteoarthritis of knee, unspecified: Secondary | ICD-10-CM

## 2019-11-14 HISTORY — DX: Osteoarthritis of knee, unspecified: M17.9

## 2019-11-15 ENCOUNTER — Other Ambulatory Visit: Payer: Self-pay | Admitting: Family

## 2019-11-15 DIAGNOSIS — I700 Atherosclerosis of aorta: Secondary | ICD-10-CM

## 2019-11-19 ENCOUNTER — Encounter: Payer: PPO | Admitting: Family

## 2019-12-05 DIAGNOSIS — M1711 Unilateral primary osteoarthritis, right knee: Secondary | ICD-10-CM | POA: Diagnosis not present

## 2019-12-07 ENCOUNTER — Telehealth: Payer: Self-pay | Admitting: *Deleted

## 2019-12-07 DIAGNOSIS — Z87891 Personal history of nicotine dependence: Secondary | ICD-10-CM

## 2019-12-07 NOTE — Telephone Encounter (Signed)
Patient has been notified that annual lung cancer screening low dose CT scan is due currently or will be in near future. Confirmed that patient is within the age range of 55-77, and asymptomatic, (no signs or symptoms of lung cancer). Patient denies illness that would prevent curative treatment for lung cancer if found. Verified smoking history, (former, quit 2016, 50 pack year). The shared decision making visit was done 12/07/18. Patient is agreeable for CT scan being scheduled.

## 2019-12-24 ENCOUNTER — Other Ambulatory Visit: Payer: Self-pay

## 2019-12-26 ENCOUNTER — Other Ambulatory Visit (INDEPENDENT_AMBULATORY_CARE_PROVIDER_SITE_OTHER): Payer: PPO

## 2019-12-26 ENCOUNTER — Other Ambulatory Visit: Payer: Self-pay

## 2019-12-26 DIAGNOSIS — Z0000 Encounter for general adult medical examination without abnormal findings: Secondary | ICD-10-CM

## 2019-12-26 LAB — CBC WITH DIFFERENTIAL/PLATELET
Basophils Absolute: 0 10*3/uL (ref 0.0–0.1)
Basophils Relative: 0.7 % (ref 0.0–3.0)
Eosinophils Absolute: 0 10*3/uL (ref 0.0–0.7)
Eosinophils Relative: 0.9 % (ref 0.0–5.0)
HCT: 41.3 % (ref 36.0–46.0)
Hemoglobin: 14 g/dL (ref 12.0–15.0)
Lymphocytes Relative: 19.7 % (ref 12.0–46.0)
Lymphs Abs: 1.1 10*3/uL (ref 0.7–4.0)
MCHC: 33.7 g/dL (ref 30.0–36.0)
MCV: 94.4 fl (ref 78.0–100.0)
Monocytes Absolute: 0.5 10*3/uL (ref 0.1–1.0)
Monocytes Relative: 9 % (ref 3.0–12.0)
Neutro Abs: 3.8 10*3/uL (ref 1.4–7.7)
Neutrophils Relative %: 69.7 % (ref 43.0–77.0)
Platelets: 176 10*3/uL (ref 150.0–400.0)
RBC: 4.38 Mil/uL (ref 3.87–5.11)
RDW: 13.9 % (ref 11.5–15.5)
WBC: 5.4 10*3/uL (ref 4.0–10.5)

## 2019-12-26 LAB — COMPREHENSIVE METABOLIC PANEL
ALT: 23 U/L (ref 0–35)
AST: 24 U/L (ref 0–37)
Albumin: 3.9 g/dL (ref 3.5–5.2)
Alkaline Phosphatase: 57 U/L (ref 39–117)
BUN: 27 mg/dL — ABNORMAL HIGH (ref 6–23)
CO2: 32 mEq/L (ref 19–32)
Calcium: 9.2 mg/dL (ref 8.4–10.5)
Chloride: 103 mEq/L (ref 96–112)
Creatinine, Ser: 0.79 mg/dL (ref 0.40–1.20)
GFR: 71.32 mL/min (ref >60.00)
Glucose, Bld: 78 mg/dL (ref 70–99)
Potassium: 3.8 mEq/L (ref 3.5–5.1)
Sodium: 141 mEq/L (ref 135–145)
Total Bilirubin: 0.7 mg/dL (ref 0.2–1.2)
Total Protein: 6.3 g/dL (ref 6.0–8.3)

## 2019-12-26 LAB — LIPID PANEL
Cholesterol: 132 mg/dL (ref 0–200)
HDL: 73.3 mg/dL (ref >39.00)
LDL Cholesterol: 48 mg/dL (ref 0–99)
NonHDL: 59.19
Total CHOL/HDL Ratio: 2
Triglycerides: 56 mg/dL (ref 0.0–149.0)
VLDL: 11.2 mg/dL (ref 0.0–40.0)

## 2019-12-26 LAB — VITAMIN D 25 HYDROXY (VIT D DEFICIENCY, FRACTURES): VITD: 46.25 ng/mL (ref 30.00–100.00)

## 2019-12-26 LAB — TSH: TSH: 0.93 u[IU]/mL (ref 0.35–4.50)

## 2019-12-28 ENCOUNTER — Encounter: Payer: Self-pay | Admitting: Family

## 2019-12-28 ENCOUNTER — Other Ambulatory Visit: Payer: Self-pay

## 2019-12-28 ENCOUNTER — Ambulatory Visit (INDEPENDENT_AMBULATORY_CARE_PROVIDER_SITE_OTHER): Payer: PPO | Admitting: Family

## 2019-12-28 VITALS — BP 128/60 | HR 83 | Temp 95.6°F | Ht 62.0 in | Wt 162.6 lb

## 2019-12-28 DIAGNOSIS — R1013 Epigastric pain: Secondary | ICD-10-CM | POA: Diagnosis not present

## 2019-12-28 DIAGNOSIS — Z0001 Encounter for general adult medical examination with abnormal findings: Secondary | ICD-10-CM

## 2019-12-28 DIAGNOSIS — I10 Essential (primary) hypertension: Secondary | ICD-10-CM

## 2019-12-28 DIAGNOSIS — Z0000 Encounter for general adult medical examination without abnormal findings: Secondary | ICD-10-CM

## 2019-12-28 MED ORDER — OMEPRAZOLE 20 MG PO CPDR: 20.0000 mg | DELAYED_RELEASE_CAPSULE | Freq: Every day | ORAL | 0 refills | Status: DC

## 2019-12-28 NOTE — Assessment & Plan Note (Signed)
Improved when BP taken by myself.  Advised her to keep a log at home and will follow this in 4 to 6 weeks

## 2019-12-28 NOTE — Assessment & Plan Note (Signed)
Discussed working diagnosis of atypical GERD.  Patient has benign abdominal exam and no pain today.  We discussed trial of Prilosec close follow-up in 4 to 6 weeks

## 2019-12-28 NOTE — Assessment & Plan Note (Signed)
Clinical breast exam performed today.  Mammogram is ordered and patient will schedule.  Screening labs done prior.  Deferred pelvic exam in the absence of complaints and patient also follows with GYN, Dr. Amalia Hailey.

## 2019-12-28 NOTE — Progress Notes (Signed)
Subjective:    Patient ID: Valerie Curry, female    DOB: [birth year], 74 y.o.   MRN: [id number]  CC: Valerie Curry is a 74 y.o. female who presents today for physical exam.    HPI: Presents for CPE   Over the past year describes a feeling of epigastric "burning" pain.  This has been unchanged after recent cholecystectomy ( 09/2019).  She does feel like it is "maybe" a little less severe.  Endorses nausea when this happens.  The pain can be severe at times.  There is no rhyme or reason ; not related particular foods or even mealtime.  Can occur at nighttime for several hours after eating.  She has had 2 episodes in the month of November 2020, 4 episodes in the month of December 2020 and 2 episodes over the past month.  They resolve within minutes of when she takes Pepto-Bismol.  She denies any vomiting, fever, belching, early satiety or focal abdominal pain.  HTN-compliant with medication.  No chest pain, shortness of breath.  No vaginal bleeding.  Has a history of uterine prolapse and is seeing Dr. Jeannie Fend for this.  She plans to return.     Colorectal Cancer Screening: UTD  Breast Cancer Screening: Mammogram UTD, due 12/2019 Cervical Cancer Screening: No longer screening.  Bone Health screening/DEXA for 65+: DEXA 2020 shows osteoporosis, patient would like to repeat this in a year  lung Cancer Screening: Former smoker, UTD screen       Tetanus - utd        Pneumococcal - complete  Exercise: Gets regular exercise with treadmill approximately 3 times a week. She follows a dentist regularly Skin-no new skin lesions Alcohol use: occasional Smoking/tobacco use: Nonsmoker.  Wears seat belt: Yes.  HISTORY:  Past Medical History:  Diagnosis Date  . Arthritis   . Complication of anesthesia    hard to wake up  . Coronary artery disease 11/2018   calcification in LAD and RCA in screening lung scan   . Family history of malignant neoplasm of breast    Maternal/paternal aunts  and cousin  . Hypertension   . Lump or mass in breast 2011/2012   benign  . Obesity, unspecified   . Osteopenia    took Boniva 1 1/2 years in past-doesn't want to restart it  . Personal history of tobacco use, presenting hazards to health   . PONV (postoperative nausea and vomiting)   . Positive PPD    as a child. xray was normal.  . Systemic sclerosis [facility])     Past Surgical History:  Procedure Laterality Date  . APPENDECTOMY  1961  . BREAST BIOPSY Right Nov 2011   benign  . BREAST BIOPSY Left Nov 2012   dilated ducts-benign  . BUNIONECTOMY Bilateral 2010   x 2  . CHOLECYSTECTOMY N/A 10/03/2019   Procedure: LAPAROSCOPIC CHOLECYSTECTOMY;  Surgeon: Fredirick Maudlin, MD;  Location: [facility] ORS;  Service: General;  Laterality: N/A;  . COLONOSCOPY  8/05   Dr. Vira Agar  . COLONOSCOPY WITH PROPOFOL N/A 01/10/2019   Procedure: COLONOSCOPY WITH PROPOFOL;  Surgeon: Virgel Manifold, MD;  Location: [facility] ENDOSCOPY;  Service: Endoscopy;  Laterality: N/A;  . DECOMPRESSIVE LUMBAR LAMINECTOMY LEVEL 2 N/A 04/30/2015   Procedure: CENTRAL DECOMPRESSIVE LUMBER LAMINECTOMY WITH POSSIBLE FORAMINOTOMIES L3-L4,L4-L5;  Surgeon: Latanya Maudlin, MD;  Location: [facility] ORS;  Service: Orthopedics;  Laterality: N/A;  . TONSILLECTOMY  1968  . TUBAL LIGATION  1974   Family History  Problem  Relation Age of Onset  . Lung cancer Father   . Heart disease Father 13       Cardiac Arrest  . Heart disease Mother   . Hypertension Mother   . Stroke Mother   . Other Brother        blood clots  . Other Other        hypercoag-family history  . Cancer Maternal Aunt        Breast cancer  . Breast cancer Maternal Aunt 68  . Breast cancer Paternal Aunt 14  . Breast cancer Cousin       ALLERGIES: Patient has no known allergies.  Current Outpatient Medications on File Prior to Visit  Medication Sig Dispense Refill  . Calcium Citrate-Vitamin D (CALCIUM CITRATE +D PO) Take 2 tablets by mouth daily.    . Cholecalciferol  (VITAMIN D) 50 MCG (2000 UT) CAPS Take 2,000 Units by mouth daily.    . hydrochlorothiazide (HYDRODIURIL) 25 MG tablet Take 0.5 tablets (12.5 mg total) by mouth daily. 90 tablet 3  . rosuvastatin (CRESTOR) 10 MG tablet Take 1 tablet (10 mg total) by mouth every morning. 90 tablet 1  . acetaminophen (TYLENOL) 325 MG tablet Take 650 mg by mouth every 6 (six) hours as needed for moderate pain.    Marland Kitchen ibuprofen (ADVIL) 800 MG tablet Take 1 tablet (800 mg total) by mouth every 8 (eight) hours as needed. 30 tablet 0   No current facility-administered medications on file prior to visit.    Social History   Tobacco Use  . Smoking status: Former Smoker    Packs/day: 1.00    Years: 50.00    Pack years: 50.00    Types: Cigarettes    Quit date: 11/29/2014    Years since quitting: 5.0  . Smokeless tobacco: Former Systems developer    Quit date: 04/30/2015  Substance Use Topics  . Alcohol use: Yes    Alcohol/week: 0.0 standard drinks    Comment: occasional glassof wine   . Drug use: No    Review of Systems  Constitutional: Negative for chills, fever and unexpected weight change.  HENT: Negative for congestion.   Respiratory: Negative for cough.   Cardiovascular: Negative for chest pain, palpitations and leg swelling.  Gastrointestinal: Positive for abdominal pain (epigastric) and nausea. Negative for vomiting.  Genitourinary: Negative for vaginal bleeding.  Musculoskeletal: Negative for arthralgias and myalgias.  Skin: Negative for rash.  Neurological: Negative for headaches.  Hematological: Negative for adenopathy.  Psychiatric/Behavioral: Negative for confusion.      Objective:    BP 128/60 Comment: left arm  Pulse 83   Temp (!) 95.6 F (35.3 C) (Temporal)   Ht 5\' 2"  (1.575 m)   Wt 162 lb 9.6 oz (73.8 kg)   SpO2 98%   BMI 29.74 kg/m   BP Readings from Last 3 Encounters:  12/28/19 128/60  10/23/19 (!) 149/85  10/03/19 139/89   Wt Readings from Last 3 Encounters:  12/28/19 162 lb 9.6 oz  (73.8 kg)  10/23/19 161 lb 9.6 oz (73.3 kg)  10/03/19 160 lb 7.9 oz (72.8 kg)    Physical Exam Vitals reviewed.  Constitutional:      Appearance: Normal appearance. She is well-developed.  Eyes:     Conjunctiva/sclera: Conjunctivae normal.  Neck:     Thyroid: No thyroid mass or thyromegaly.  Cardiovascular:     Rate and Rhythm: Normal rate and regular rhythm.     Pulses: Normal pulses.     Heart  sounds: Normal heart sounds.  Pulmonary:     Effort: Pulmonary effort is normal.     Breath sounds: Normal breath sounds. No wheezing, rhonchi or rales.  Chest:     Breasts: Breasts are symmetrical.        Right: No inverted nipple, mass, nipple discharge, skin change or tenderness.        Left: No inverted nipple, mass, nipple discharge, skin change or tenderness.  Abdominal:     General: Bowel sounds are normal. There is no distension.     Palpations: Abdomen is soft. Abdomen is not rigid. There is no fluid wave or mass.     Tenderness: There is no abdominal tenderness. There is no guarding or rebound.  Lymphadenopathy:     Head:     Right side of head: No submental, submandibular, tonsillar, preauricular, posterior auricular or occipital adenopathy.     Left side of head: No submental, submandibular, tonsillar, preauricular, posterior auricular or occipital adenopathy.     Cervical: No cervical adenopathy.     Right cervical: No superficial, deep or posterior cervical adenopathy.    Left cervical: No superficial, deep or posterior cervical adenopathy.  Skin:    General: Skin is warm and dry.  Neurological:     Mental Status: She is alert.  Psychiatric:        Speech: Speech normal.        Behavior: Behavior normal.        Thought Content: Thought content normal.        Assessment & Plan:   Problem List Items Addressed This Visit      Cardiovascular and Mediastinum   HTN (hypertension)    Improved when BP taken by myself.  Advised her to keep a log at home and will  follow this in 4 to 6 weeks        Other   Epigastric pain    Discussed working diagnosis of atypical GERD.  Patient has benign abdominal exam and no pain today.  We discussed trial of Prilosec close follow-up in 4 to 6 weeks      Relevant Medications   omeprazole (PRILOSEC) 20 MG capsule   Routine physical examination - Primary    Clinical breast exam performed today.  Mammogram is ordered and patient will schedule.  Screening labs done prior.  Deferred pelvic exam in the absence of complaints and patient also follows with GYN, Dr. Amalia Hailey.      Relevant Orders   MM 3D SCREEN BREAST BILATERAL       Curry am having Valerie Curry start on omeprazole. Curry am also having her maintain her hydrochlorothiazide, Vitamin D, Calcium Citrate-Vitamin D (CALCIUM CITRATE +D PO), acetaminophen, ibuprofen, and rosuvastatin.   Meds ordered this encounter  Medications  . omeprazole (PRILOSEC) 20 MG capsule    Sig: Take 1 capsule (20 mg total) by mouth daily. Take an hour before meal    Dispense:  90 capsule    Refill:  0    Order Specific Question:   Supervising Provider    Answer:   Crecencio Mc [[id number]]    Return precautions given.   Risks, benefits, and alternatives of the medications and treatment plan prescribed today were discussed, and patient expressed understanding.   Education regarding symptom management and diagnosis given to patient on AVS.   Continue to follow with Burnard Hawthorne, FNP for routine health maintenance.   Valerie Curry agreed with plan.   Joycelyn Schmid  Vidal Schwalbe, FNP

## 2019-12-28 NOTE — Patient Instructions (Signed)

## 2020-01-01 ENCOUNTER — Other Ambulatory Visit: Payer: Self-pay

## 2020-01-01 ENCOUNTER — Ambulatory Visit
Admission: RE | Admit: 2020-01-01 | Discharge: 2020-01-01 | Disposition: A | Discharge status: Home / Self Care | Payer: PPO | Source: Ambulatory Visit | Attending: Nurse Practitioner | Admitting: Nurse Practitioner

## 2020-01-01 DIAGNOSIS — Z87891 Personal history of nicotine dependence: Secondary | ICD-10-CM

## 2020-01-02 ENCOUNTER — Telehealth: Payer: Self-pay | Admitting: Family

## 2020-01-02 NOTE — Telephone Encounter (Signed)
Call pt  I received results from your annual CT lung scan from [facility] which showed emphysema changes in the lungs however no nodules of concern.   You will need do this again next year so please ensure you are contacted in regards to this and certainly call our office if you are not contacted for another test.

## 2020-01-03 ENCOUNTER — Encounter: Payer: Self-pay | Admitting: *Deleted

## 2020-01-03 NOTE — Telephone Encounter (Signed)
I called patient & she had reviewed results online. She will continue annual screenings.

## 2020-02-05 ENCOUNTER — Telehealth: Payer: Self-pay | Admitting: Family

## 2020-02-05 ENCOUNTER — Other Ambulatory Visit: Payer: Self-pay

## 2020-02-05 ENCOUNTER — Ambulatory Visit (INDEPENDENT_AMBULATORY_CARE_PROVIDER_SITE_OTHER): Payer: PPO | Admitting: Family

## 2020-02-05 ENCOUNTER — Encounter: Payer: Self-pay | Admitting: Family

## 2020-02-05 DIAGNOSIS — I10 Essential (primary) hypertension: Secondary | ICD-10-CM

## 2020-02-05 DIAGNOSIS — R1013 Epigastric pain: Secondary | ICD-10-CM

## 2020-02-05 MED ORDER — OMEPRAZOLE 20 MG PO CPDR: 40.0000 mg | DELAYED_RELEASE_CAPSULE | Freq: Every day | ORAL | 1 refills | Status: DC

## 2020-02-05 NOTE — Assessment & Plan Note (Signed)
Improved, less frequent and less intense when she has an episode.  We decided to continue with the Prilosec and increase to 40 mg temporarily for 6 to 8 weeks to see if complete resolve.  We discussed then weaning off and ultimately using a PPI for flareups versus daily.  Patient let me know she is doing.

## 2020-02-05 NOTE — Progress Notes (Signed)
Subjective:    Patient ID: Valerie Curry, female    DOB: [birth year], 74 y.o.   MRN: [id number]  CC: Valerie Curry is a 74 y.o. female who presents today for follow up.   HPI: Feels well today, no new concerns  Abdominal pain-Definitely thinks abdominal pain has improved. Has had 4 episodes over the last month. Which notes this is less episodes, and two episodes were less severe. Prilosec 20mg .  Improvement in belching.  No abdominal pain, abdominal distention  Hypertension- at home, 107/70, 133/83, 125/81, highest 137/86.   Daily exercise, describes 'rigourous' and feels well. 2000 steps in 15 minutes.  No sob, cp.     HISTORY:  Past Medical History:  Diagnosis Date  . Arthritis   . Complication of anesthesia    hard to wake up  . Coronary artery disease 11/2018   calcification in LAD and RCA in screening lung scan   . Family history of malignant neoplasm of breast    Maternal/paternal aunts and cousin  . Hypertension   . Lump or mass in breast 2011/2012   benign  . Obesity, unspecified   . Osteopenia    took Boniva 1 1/2 years in past-doesn't want to restart it  . Personal history of tobacco use, presenting hazards to health   . PONV (postoperative nausea and vomiting)   . Positive PPD    as a child. xray was normal.  . Systemic sclerosis [facility])    Past Surgical History:  Procedure Laterality Date  . APPENDECTOMY  1961  . BREAST BIOPSY Right Nov 2011   benign  . BREAST BIOPSY Left Nov 2012   dilated ducts-benign  . BUNIONECTOMY Bilateral 2010   x 2  . CHOLECYSTECTOMY N/A 10/03/2019   Procedure: LAPAROSCOPIC CHOLECYSTECTOMY;  Surgeon: Fredirick Maudlin, MD;  Location: [facility] ORS;  Service: General;  Laterality: N/A;  . COLONOSCOPY  8/05   Dr. Vira Agar  . COLONOSCOPY WITH PROPOFOL N/A 01/10/2019   Procedure: COLONOSCOPY WITH PROPOFOL;  Surgeon: Virgel Manifold, MD;  Location: [facility] ENDOSCOPY;  Service: Endoscopy;  Laterality: N/A;  . DECOMPRESSIVE LUMBAR  LAMINECTOMY LEVEL 2 N/A 04/30/2015   Procedure: CENTRAL DECOMPRESSIVE LUMBER LAMINECTOMY WITH POSSIBLE FORAMINOTOMIES L3-L4,L4-L5;  Surgeon: Latanya Maudlin, MD;  Location: [facility];  Service: Orthopedics;  Laterality: N/A;  . TONSILLECTOMY  1968  . TUBAL LIGATION  1974   Family History  Problem Relation Age of Onset  . Lung cancer Father   . Heart disease Father 18       Cardiac Arrest  . Heart disease Mother   . Hypertension Mother   . Stroke Mother   . Other Brother        blood clots  . Other Other        hypercoag-family history  . Cancer Maternal Aunt        Breast cancer  . Breast cancer Maternal Aunt 49  . Breast cancer Paternal Aunt 70  . Breast cancer Cousin     Allergies: Patient has no known allergies. Current Outpatient Medications on File Prior to Visit  Medication Sig Dispense Refill  . acetaminophen (TYLENOL) 325 MG tablet Take 650 mg by mouth every 6 (six) hours as needed for moderate pain.    . Calcium Citrate-Vitamin D (CALCIUM CITRATE +D PO) Take 2 tablets by mouth daily.    . Cholecalciferol (VITAMIN D) 50 MCG (2000 UT) CAPS Take 2,000 Units by mouth daily.    . hydrochlorothiazide (HYDRODIURIL) 25 MG tablet Take  0.5 tablets (12.5 mg total) by mouth daily. 90 tablet 3  . ibuprofen (ADVIL) 800 MG tablet Take 1 tablet (800 mg total) by mouth every 8 (eight) hours as needed. 30 tablet 0  . rosuvastatin (CRESTOR) 10 MG tablet Take 1 tablet (10 mg total) by mouth every morning. 90 tablet 1   No current facility-administered medications on file prior to visit.    Social History   Tobacco Use  . Smoking status: Former Smoker    Packs/day: 1.00    Years: 50.00    Pack years: 50.00    Types: Cigarettes    Quit date: 11/29/2014    Years since quitting: 5.1  . Smokeless tobacco: Former Systems developer    Quit date: 04/30/2015  Substance Use Topics  . Alcohol use: Yes    Alcohol/week: 0.0 standard drinks    Comment: occasional glassof wine   . Drug use: No    Review of  Systems  Constitutional: Negative for chills and fever.  Respiratory: Negative for cough.   Cardiovascular: Negative for chest pain and palpitations.  Gastrointestinal: Negative for nausea and vomiting.      Objective:    BP 118/78   Pulse 69   Temp (!) 97 F (36.1 C) (Temporal)   Ht 5\' 3"  (1.6 m)   Wt 163 lb 12.8 oz (74.3 kg)   SpO2 96%   BMI 29.02 kg/m  BP Readings from Last 3 Encounters:  02/05/20 118/78  12/28/19 128/60  10/23/19 (!) 149/85   Wt Readings from Last 3 Encounters:  02/05/20 163 lb 12.8 oz (74.3 kg)  01/01/20 158 lb (71.7 kg)  12/28/19 162 lb 9.6 oz (73.8 kg)    Physical Exam Vitals reviewed.  Constitutional:      Appearance: She is well-developed.  Eyes:     Conjunctiva/sclera: Conjunctivae normal.  Cardiovascular:     Rate and Rhythm: Normal rate and regular rhythm.     Pulses: Normal pulses.     Heart sounds: Normal heart sounds.  Pulmonary:     Effort: Pulmonary effort is normal.     Breath sounds: Normal breath sounds. No wheezing, rhonchi or rales.  Skin:    General: Skin is warm and dry.  Neurological:     Mental Status: She is alert.  Psychiatric:        Speech: Speech normal.        Behavior: Behavior normal.        Thought Content: Thought content normal.        Assessment & Plan:   Problem List Items Addressed This Visit      Cardiovascular and Mediastinum   HTN (hypertension)    At goal, discussed goal of pressure between 120-130/80 which patient is really targeting.  We agreed no changes today.  Patient will need to monitor and let me know of any concerns        Other   Epigastric pain    Improved, less frequent and less intense when she has an episode.  We decided to continue with the Prilosec and increase to 40 mg temporarily for 6 to 8 weeks to see if complete resolve.  We discussed then weaning off and ultimately using a PPI for flareups versus daily.  Patient let me know she is doing.      Relevant Medications    omeprazole (PRILOSEC) 20 MG capsule       I have changed Tor Netters. Axtman's omeprazole. I am also having her maintain her hydrochlorothiazide, Vitamin  D, Calcium Citrate-Vitamin D (CALCIUM CITRATE +D PO), acetaminophen, ibuprofen, and rosuvastatin.   Meds ordered this encounter  Medications  . omeprazole (PRILOSEC) 20 MG capsule    Sig: Take 2 capsules (40 mg total) by mouth daily. Take an hour before meal    Dispense:  60 capsule    Refill:  1    Order Specific Question:   Supervising Provider    Answer:   Crecencio Mc [[id number]]    Return precautions given.   Risks, benefits, and alternatives of the medications and treatment plan prescribed today were discussed, and patient expressed understanding.   Education regarding symptom management and diagnosis given to patient on AVS.  Continue to follow with Burnard Hawthorne, FNP for routine health maintenance.   Valerie Curry and I agreed with plan.   Mable Paris, FNP

## 2020-02-05 NOTE — Telephone Encounter (Signed)
Lvm pt needs 6 month follow up.

## 2020-02-05 NOTE — Patient Instructions (Signed)
As discussed, let us try Prilosec 40 mg for the next 6 to 8 weeks.  If symptoms do not completely resolve, dont hesitate let me know.  Otherwise the plan will be to wean from 40 mg back to 20mg  and then eventually off the medication to a point that we can use as needed for flares.  Stay safe

## 2020-02-05 NOTE — Assessment & Plan Note (Signed)
At goal, discussed goal of pressure between 120-130/80 which patient is really targeting.  We agreed no changes today.  Patient will need to monitor and let me know of any concerns

## 2020-02-12 ENCOUNTER — Ambulatory Visit
Admission: RE | Admit: 2020-02-12 | Discharge: 2020-02-12 | Disposition: A | Discharge status: Home / Self Care | Payer: PPO | Source: Ambulatory Visit | Attending: Family | Admitting: Family

## 2020-02-12 DIAGNOSIS — Z0000 Encounter for general adult medical examination without abnormal findings: Secondary | ICD-10-CM

## 2020-02-12 DIAGNOSIS — Z1231 Encounter for screening mammogram for malignant neoplasm of breast: Secondary | ICD-10-CM | POA: Insufficient documentation

## 2020-02-21 ENCOUNTER — Other Ambulatory Visit: Payer: Self-pay | Admitting: Family

## 2020-02-21 DIAGNOSIS — R1013 Epigastric pain: Secondary | ICD-10-CM

## 2020-02-26 ENCOUNTER — Encounter: Payer: Self-pay | Admitting: Family

## 2020-02-26 ENCOUNTER — Other Ambulatory Visit: Payer: Self-pay

## 2020-02-26 DIAGNOSIS — R1013 Epigastric pain: Secondary | ICD-10-CM

## 2020-02-26 MED ORDER — OMEPRAZOLE 20 MG PO CPDR: 40.0000 mg | DELAYED_RELEASE_CAPSULE | Freq: Every day | ORAL | 1 refills | Status: DC

## 2020-03-12 ENCOUNTER — Ambulatory Visit
Admission: EM | Admit: 2020-03-12 | Discharge: 2020-03-12 | Disposition: A | Discharge status: Home / Self Care | Payer: PPO | Attending: Emergency Medicine | Admitting: Emergency Medicine

## 2020-03-12 DIAGNOSIS — M545 Low back pain: Secondary | ICD-10-CM | POA: Diagnosis not present

## 2020-03-12 DIAGNOSIS — J101 Influenza due to other identified influenza virus with other respiratory manifestations: Secondary | ICD-10-CM | POA: Insufficient documentation

## 2020-03-12 DIAGNOSIS — B349 Viral infection, unspecified: Secondary | ICD-10-CM | POA: Diagnosis not present

## 2020-03-12 DIAGNOSIS — R509 Fever, unspecified: Secondary | ICD-10-CM | POA: Insufficient documentation

## 2020-03-12 LAB — POCT URINALYSIS DIP (MANUAL ENTRY)
Glucose, UA: NEGATIVE mg/dL
Ketones, POC UA: NEGATIVE mg/dL
Leukocytes, UA: NEGATIVE
Nitrite, UA: NEGATIVE
Protein Ur, POC: 30 mg/dL — AB
Spec Grav, UA: 1.025 (ref 1.010–1.025)
Urobilinogen, UA: 2 E.U./dL — AB
pH, UA: 7.5 (ref 5.0–8.0)

## 2020-03-12 LAB — POCT INFLUENZA A/B
Influenza A, POC: POSITIVE — AB
Influenza B, POC: NEGATIVE

## 2020-03-12 LAB — POC SARS CORONAVIRUS 2 AG -  ED: SARS Coronavirus 2 Ag: NEGATIVE

## 2020-03-12 NOTE — ED Provider Notes (Signed)
Valerie Curry    CSN: [id number] Arrival date & time: 03/12/20  1109      History   Chief Complaint Chief Complaint  Patient presents with  . Fever  . Back Pain    right side    HPI Valerie Curry is a 74 y.o. female.   Patient presents with intermittent fevers since 03/08/2020.  Temp was 101.7 this morning; she took ibuprofen.  She also reports chills and right lower back pain.  She denies sore throat, cough, shortness of breath, dysuria, hematuria, vomiting, diarrhea, rash, or other symptoms.    The history is provided by the patient.    Past Medical History:  Diagnosis Date  . Arthritis   . Complication of anesthesia    hard to wake up  . Coronary artery disease 11/2018   calcification in LAD and RCA in screening lung scan   . Family history of malignant neoplasm of breast    Maternal/paternal aunts and cousin  . Hypertension   . Lump or mass in breast 2011/2012   benign  . Obesity, unspecified   . Osteopenia    took Boniva 1 1/2 years in past-doesn't want to restart it  . Personal history of tobacco use, presenting hazards to health   . PONV (postoperative nausea and vomiting)   . Positive PPD    as a child. xray was normal.  . Systemic sclerosis [facility])     Patient Active Problem List   Diagnosis Date Noted  . Epigastric pain 12/28/2019  . Symptomatic cholelithiasis   . Umbilical hernia without obstruction and without gangrene   . Calculus of gallbladder without cholecystitis without obstruction 09/20/2019  . Elevated liver enzymes 09/20/2019  . Chronic diarrhea   . Benign neoplasm of descending colon   . Atherosclerosis of aorta ([place]) 12/13/2018  . HLD (hyperlipidemia) 11/17/2018  . Uterine prolapse 11/17/2018  . Diarrhea 11/17/2018  . Family history of clotting disorder 11/15/2017  . Encounter for screening for malignant neoplasm of respiratory organs 10/06/2015  . Postmenopausal estrogen deficiency 10/06/2015  . Spinal stenosis, lumbar  region, with neurogenic claudication 04/30/2015  . Routine physical examination 03/25/2015  . Low back pain 03/25/2015  . Screening for breast cancer 09/13/2014  . Obesity (BMI 30-39.9) 09/13/2014  . HTN (hypertension) 08/31/2013  . Osteoporosis 06/27/2007    Past Surgical History:  Procedure Laterality Date  . APPENDECTOMY  1961  . BREAST BIOPSY Right Nov 2011   benign  . BREAST BIOPSY Left Nov 2012   dilated ducts-benign  . BUNIONECTOMY Bilateral 2010   x 2  . CHOLECYSTECTOMY N/A 10/03/2019   Procedure: LAPAROSCOPIC CHOLECYSTECTOMY;  Surgeon: Fredirick Maudlin, MD;  Location: [facility] ORS;  Service: General;  Laterality: N/A;  . COLONOSCOPY  8/05   Dr. Vira Agar  . COLONOSCOPY WITH PROPOFOL N/A 01/10/2019   Procedure: COLONOSCOPY WITH PROPOFOL;  Surgeon: Virgel Manifold, MD;  Location: [facility] ENDOSCOPY;  Service: Endoscopy;  Laterality: N/A;  . DECOMPRESSIVE LUMBAR LAMINECTOMY LEVEL 2 N/A 04/30/2015   Procedure: CENTRAL DECOMPRESSIVE LUMBER LAMINECTOMY WITH POSSIBLE FORAMINOTOMIES L3-L4,L4-L5;  Surgeon: Latanya Maudlin, MD;  Location: [facility] ORS;  Service: Orthopedics;  Laterality: N/A;  . TONSILLECTOMY  1968  . TUBAL LIGATION  1974    OB History    Gravida  2   Para  2   Term      Preterm      AB      Living  2     SAB  TAB      Ectopic      Multiple      Live Births           Obstetric Comments  Age with firs menstruation-11 Age with first pregnancy-21         Home Medications    Prior to Admission medications   Medication Sig Start Date End Date Taking? Authorizing Provider  acetaminophen (TYLENOL) 325 MG tablet Take 650 mg by mouth every 6 (six) hours as needed for moderate pain.   Yes [provider]  Calcium Citrate-Vitamin D (CALCIUM CITRATE +D PO) Take 2 tablets by mouth daily.   Yes [provider]  Cholecalciferol (VITAMIN D) 50 MCG (2000 UT) CAPS Take 2,000 Units by mouth daily.   Yes [provider]    hydrochlorothiazide (HYDRODIURIL) 25 MG tablet Take 0.5 tablets (12.5 mg total) by mouth daily. 02/08/19  Yes Burnard Hawthorne, FNP  ibuprofen (ADVIL) 800 MG tablet Take 1 tablet (800 mg total) by mouth every 8 (eight) hours as needed. 10/03/19  Yes Fredirick Maudlin, MD  omeprazole (PRILOSEC) 20 MG capsule Take 2 capsules (40 mg total) by mouth daily. Take an hour before meal 02/26/20  Yes Arnett, Yvetta Coder, FNP  rosuvastatin (CRESTOR) 10 MG tablet Take 1 tablet (10 mg total) by mouth every morning. 11/15/19  Yes Burnard Hawthorne, FNP    Family History Family History  Problem Relation Age of Onset  . Lung cancer Father   . Heart disease Father 98       Cardiac Arrest  . Heart disease Mother   . Hypertension Mother   . Stroke Mother   . Other Brother        blood clots  . Other Other        hypercoag-family history  . Cancer Maternal Aunt        Breast cancer  . Breast cancer Maternal Aunt 39  . Breast cancer Paternal Aunt 33  . Breast cancer Cousin     Social History Social History   Tobacco Use  . Smoking status: Former Smoker    Packs/day: 1.00    Years: 50.00    Pack years: 50.00    Types: Cigarettes    Quit date: 11/29/2014    Years since quitting: 5.2  . Smokeless tobacco: Former Systems developer    Quit date: 04/30/2015  Substance Use Topics  . Alcohol use: Yes    Alcohol/week: 0.0 standard drinks    Comment: occasional glassof wine   . Drug use: No     Allergies   Patient has no known allergies.   Review of Systems Review of Systems  Constitutional: Positive for chills and fever.  HENT: Negative for congestion, ear pain, rhinorrhea and sore throat.   Eyes: Negative for pain and visual disturbance.  Respiratory: Negative for cough and shortness of breath.   Cardiovascular: Negative for chest pain and palpitations.  Gastrointestinal: Negative for abdominal pain, diarrhea, nausea and vomiting.  Genitourinary: Negative for dysuria and hematuria.  Musculoskeletal:  Positive for back pain. Negative for arthralgias.  Skin: Negative for color change and rash.  Neurological: Negative for seizures and syncope.  All other systems reviewed and are negative.    Physical Exam Triage Vital Signs ED Triage Vitals  Enc Vitals Group     BP      Pulse      Resp      Temp      Temp src  SpO2      Weight      Height      Head Circumference      Peak Flow      Pain Score      Pain Loc      Pain Edu?      Excl. in [place]?    No data found.  Updated Vital Signs BP 137/83 (BP Location: Left Arm)   Pulse 83   Temp 98.8 F (37.1 C) (Oral)   Resp 18   Ht 5\' 2"  (1.575 m)   Wt 160 lb (72.6 kg)   SpO2 98%   BMI 29.26 kg/m   Visual Acuity Right Eye Distance:   Left Eye Distance:   Bilateral Distance:    Right Eye Near:   Left Eye Near:    Bilateral Near:     Physical Exam Vitals and nursing note reviewed.  Constitutional:      General: She is not in acute distress.    Appearance: She is well-developed. She is not ill-appearing.  HENT:     Head: Normocephalic and atraumatic.     Right Ear: Tympanic membrane normal.     Left Ear: Tympanic membrane normal.     Nose: Nose normal.     Mouth/Throat:     Mouth: Mucous membranes are moist.     Pharynx: Oropharynx is clear.  Eyes:     Conjunctiva/sclera: Conjunctivae normal.  Cardiovascular:     Rate and Rhythm: Normal rate and regular rhythm.     Heart sounds: Normal heart sounds. No murmur.  Pulmonary:     Effort: Pulmonary effort is normal. No respiratory distress.     Breath sounds: Normal breath sounds. No wheezing or rhonchi.  Abdominal:     General: Bowel sounds are normal.     Palpations: Abdomen is soft.     Tenderness: There is no abdominal tenderness. There is no right CVA tenderness, left CVA tenderness, guarding or rebound.  Musculoskeletal:     Cervical back: Neck supple.  Skin:    General: Skin is warm and dry.     Findings: No rash.  Neurological:     General: No  focal deficit present.     Mental Status: She is alert and oriented to person, place, and time.     Gait: Gait normal.  Psychiatric:        Mood and Affect: Mood normal.        Behavior: Behavior normal.      UC Treatments / Results  Labs (all labs ordered are listed, but only abnormal results are displayed) Labs Reviewed  POCT URINALYSIS DIP (MANUAL ENTRY) - Abnormal; Notable for the following components:      Result Value   Bilirubin, UA moderate (*)    Blood, UA trace-intact (*)    Protein Ur, POC =30 (*)    Urobilinogen, UA 2.0 (*)    All other components within normal limits  POCT INFLUENZA A/B - Abnormal; Notable for the following components:   Influenza A, POC Positive (*)    All other components within normal limits  URINE CULTURE  NOVEL CORONAVIRUS, NAA  POC SARS CORONAVIRUS 2 AG -  ED    EKG   Radiology No results found.  Procedures Procedures (including critical care time)  Medications Ordered in UC Medications - No data to display  Initial Impression / Assessment and Plan / UC Course  I have reviewed the triage vital signs and the nursing notes.  Pertinent labs & imaging results that were available during my care of the patient were reviewed by me and considered in my medical decision making (see chart for details).    Influenza A;  Fever.  Rapid flu A positive; B negative.  Patient is beyond the window of time for treatment.  Urine culture pending.  Rapid COVID negative; PCR pending.  Instructed patient to self quarantine until the PCR COVID test is back.  Tylenol as needed for fever or discomfort.  Instructed patient to follow-up with her PCP if her symptoms persist.  Instructed her to go to the ED if she has high fever not relieved with medication, shortness of breath, severe vomiting or diarrhea, or other concerning symptoms.  Patient agrees to plan of care.   Final Clinical Impressions(s) / UC Diagnoses   Final diagnoses:  Fever, unspecified    Influenza A     Discharge Instructions     Your Flu A test is positive.    Your urine culture is pending.     Take Tylenol as needed for fever or discomfort.    Your rapid COVID test is negative; the send-out test is pending.  You should self quarantine until your test result is back and is negative.    Go to the emergency department if you develop high fever, shortness of breath, severe diarrhea, or other concerning symptoms.       ED Prescriptions    None     PDMP not reviewed this encounter.   Sharion Balloon, NP 03/12/20 1235

## 2020-03-12 NOTE — ED Triage Notes (Addendum)
Pt presents with c/o fever/chills (101 7am today), right-sided back pain and some nausea, off and on for about 6 days. Pt denies abdominal pain, dysuria/hematuria, emesis/diarrhea or other symptoms.

## 2020-03-12 NOTE — Discharge Instructions (Addendum)
Your Flu A test is positive.    Your urine culture is pending.     Take Tylenol as needed for fever or discomfort.    Your rapid COVID test is negative; the send-out test is pending.  You should self quarantine until your test result is back and is negative.    Go to the emergency department if you develop high fever, shortness of breath, severe diarrhea, or other concerning symptoms.

## 2020-03-13 LAB — URINE CULTURE: Culture: <10000 — AB

## 2020-03-14 LAB — SARS-COV-2, NAA 2 DAY TAT

## 2020-03-14 LAB — NOVEL CORONAVIRUS, NAA: SARS-CoV-2, NAA: NOT DETECTED

## 2020-04-05 DIAGNOSIS — R300 Dysuria: Secondary | ICD-10-CM | POA: Diagnosis not present

## 2020-04-05 DIAGNOSIS — N390 Urinary tract infection, site not specified: Secondary | ICD-10-CM | POA: Diagnosis not present

## 2020-04-07 ENCOUNTER — Telehealth: Payer: Self-pay

## 2020-04-07 NOTE — Telephone Encounter (Signed)
Valerie Curry,  Can you help [place] here?  I have just spoken to [name] and she is going to call pt back and see if she would reconsider VV in my next avail on Weds of this week.   I feel terrible for patient.   I am concerned in regards to fever however doesn't sound like covid on the heels of 2 negative tests.   [name], please call pt as we discussed.  Let me know how your conversation goes. If she would labs done or stool cultures for Cdiff done at ahead of appointment, let me know and we can get done at [place]

## 2020-04-07 NOTE — Telephone Encounter (Signed)
You are the best sarah. Well done and nice work in handling a tough situation.

## 2020-04-07 NOTE — Telephone Encounter (Signed)
I spoke with patient who had calmed down. She was very understanding after I explained to patient all that could be accomplished through a virtual visit. I let her know that imagining could be ordered or whatever is needed than cannot be actually done here in our office. Pt was agreeable to doing a virtual on Wednesday & said that she just [employer]. It is just frustrating that she has been tested for covid with negative results & been vaccinated, but still couldn't be seen when she needed to be.

## 2020-04-07 NOTE — Telephone Encounter (Signed)
I called patient after getting a request for an appointment for nausea, vomiting, stomach issues, fever & chills. Pt has had this off & on since April. She was tested twice at [facility] the rapid as well as had test sent off. Both were negative & she testes positive for influenza. Patient did get better from that & starting last Thursday started to have the symptoms plus throwing up as well as diarrhea. She said that she has been dealing with stomach issues for awhile, but they have gotten worse. She did go to UC this weekend but that was for a UTI. She was having back pain as well as urine frequency. She was treated for this with an antibiotic. She still has fever of 100.3, but wanted to be seen in office. Per our policy I explained that unfortunately we couldn't & was going to try to get her scheduled for a virtual. Patient was VERY upset by this & stated that when people are sick they need to be seen. She said that she knew other offices were not following this strict policy & actually seeing their sick patient's. She said that she as not mad at me or at [place], but [place] in general or our office. She wants to get at the bottom of why she is having all these stomach issues & said that she will have to find new PCP that will see her other than virtually. She did not want to keep going back to walk-in clinic.

## 2020-04-09 ENCOUNTER — Other Ambulatory Visit: Payer: Self-pay

## 2020-04-09 ENCOUNTER — Telehealth (INDEPENDENT_AMBULATORY_CARE_PROVIDER_SITE_OTHER): Payer: PPO | Admitting: Family

## 2020-04-09 ENCOUNTER — Ambulatory Visit
Admission: RE | Admit: 2020-04-09 | Discharge: 2020-04-09 | Disposition: A | Discharge status: Home / Self Care | Payer: PPO | Source: Ambulatory Visit | Attending: Family | Admitting: Family

## 2020-04-09 ENCOUNTER — Encounter: Payer: Self-pay | Admitting: Family

## 2020-04-09 ENCOUNTER — Other Ambulatory Visit: Payer: Self-pay | Admitting: Family

## 2020-04-09 ENCOUNTER — Other Ambulatory Visit
Admission: RE | Admit: 2020-04-09 | Discharge: 2020-04-09 | Disposition: A | Discharge status: Home / Self Care | Payer: PPO | Source: Ambulatory Visit | Attending: Family | Admitting: Family

## 2020-04-09 VITALS — Ht 62.0 in | Wt 155.0 lb

## 2020-04-09 DIAGNOSIS — R748 Abnormal levels of other serum enzymes: Secondary | ICD-10-CM

## 2020-04-09 DIAGNOSIS — R1013 Epigastric pain: Secondary | ICD-10-CM | POA: Diagnosis not present

## 2020-04-09 DIAGNOSIS — K5730 Diverticulosis of large intestine without perforation or abscess without bleeding: Secondary | ICD-10-CM | POA: Diagnosis not present

## 2020-04-09 LAB — CBC WITH DIFFERENTIAL/PLATELET
Abs Immature Granulocytes: 0.02 10*3/uL (ref 0.00–0.07)
Basophils Absolute: 0 10*3/uL (ref 0.0–0.1)
Basophils Relative: 1 %
Eosinophils Absolute: 0.2 10*3/uL (ref 0.0–0.5)
Eosinophils Relative: 4 %
HCT: 40.1 % (ref 36.0–46.0)
Hemoglobin: 13.4 g/dL (ref 12.0–15.0)
Immature Granulocytes: 0 %
Lymphocytes Relative: 30 %
Lymphs Abs: 1.3 10*3/uL (ref 0.7–4.0)
MCH: 30.5 pg (ref 26.0–34.0)
MCHC: 33.4 g/dL (ref 30.0–36.0)
MCV: 91.3 fL (ref 80.0–100.0)
Monocytes Absolute: 0.5 10*3/uL (ref 0.1–1.0)
Monocytes Relative: 10 %
Neutro Abs: 2.5 10*3/uL (ref 1.7–7.7)
Neutrophils Relative %: 55 %
Platelets: 224 10*3/uL (ref 150–400)
RBC: 4.39 MIL/uL (ref 3.87–5.11)
RDW: 12.7 % (ref 11.5–15.5)
WBC: 4.5 10*3/uL (ref 4.0–10.5)
nRBC: 0 % (ref 0.0–0.2)

## 2020-04-09 LAB — URINALYSIS, ROUTINE W REFLEX MICROSCOPIC
Bacteria, UA: NONE SEEN
Bilirubin Urine: NEGATIVE
Glucose, UA: NEGATIVE mg/dL
Ketones, ur: NEGATIVE mg/dL
Leukocytes,Ua: NEGATIVE
Nitrite: POSITIVE — AB
Protein, ur: NEGATIVE mg/dL
Specific Gravity, Urine: 1.02 (ref 1.005–1.030)
pH: 6 (ref 5.0–8.0)

## 2020-04-09 LAB — COMPREHENSIVE METABOLIC PANEL
ALT: 163 U/L — ABNORMAL HIGH (ref 0–44)
AST: 52 U/L — ABNORMAL HIGH (ref 15–41)
Albumin: 4 g/dL (ref 3.5–5.0)
Alkaline Phosphatase: 167 U/L — ABNORMAL HIGH (ref 38–126)
Anion gap: 10 (ref 5–15)
BUN: 13 mg/dL (ref 8–23)
CO2: 30 mmol/L (ref 22–32)
Calcium: 9 mg/dL (ref 8.9–10.3)
Chloride: 96 mmol/L — ABNORMAL LOW (ref 98–111)
Creatinine, Ser: 0.71 mg/dL (ref 0.44–1.00)
GFR calc Af Amer: >60 mL/min (ref >60)
GFR calc non Af Amer: >60 mL/min (ref >60)
Glucose, Bld: 92 mg/dL (ref 70–99)
Potassium: 3 mmol/L — ABNORMAL LOW (ref 3.5–5.1)
Sodium: 136 mmol/L (ref 135–145)
Total Bilirubin: 0.9 mg/dL (ref 0.3–1.2)
Total Protein: 7 g/dL (ref 6.5–8.1)

## 2020-04-09 LAB — LIPASE, BLOOD: Lipase: 27 U/L (ref 11–51)

## 2020-04-09 LAB — POCT I-STAT CREATININE: Creatinine, Ser: 0.7 mg/dL (ref 0.44–1.00)

## 2020-04-09 LAB — AMYLASE: Amylase: 48 U/L (ref 28–100)

## 2020-04-09 MED ORDER — IOHEXOL 300 MG/ML  SOLN
100.0000 mL | Freq: Once | INTRAMUSCULAR | Status: AC | PRN
  Administered 2020-04-09: 14:00:00 100 mL via INTRAVENOUS

## 2020-04-09 MED ORDER — POTASSIUM CHLORIDE ER 10 MEQ PO TBCR: EXTENDED_RELEASE_TABLET | ORAL | 1 refills | Status: DC

## 2020-04-09 NOTE — Progress Notes (Signed)
Close  

## 2020-04-09 NOTE — Progress Notes (Signed)
Virtual Visit via Video Note  I connected with@  on 04/11/20 at 11:00 AM EDT by a video enabled telemedicine application and verified that I am speaking with the correct person using two identifiers.  Location patient: home Location provider:work  Persons participating in the virtual visit: patient, provider  I discussed the limitations of evaluation and management by telemedicine and the availability of in person appointments. The patient expressed understanding and agreed to proceed.   HPI: Complains of nausea, epigastric , midline pain, and vomiting started at 7pm to 4am , this 6 days ago. At that time, fever 100.2, chills. Had several episodes during this time frame. No exacerbated features. No related to food choice, meal.  Last day of fever 5 days ago.   Emesis in undigested food. Doesn't think blood.   Endorses fever and chills. Tried peptal bismal without relief.  Eliminated coffee without improvement.   3 days later had chills, fever 100.2 when went to [facility] urgent care. Treated with nitrofurantoin and pyridum. No dysuria. Had suprapubic pressure at that time, slight low right  back pain. No severe back pain.  Had small blood in UA. Leuks and nitrites negative.  No cough, diarrhea.  No h/o renal stone. GERD- compliant with prilosec. No real difference in regards to nausea after starting prilosec. No belching, epigastric burning, bloating,  Very occasional NSAIDs. no alcohol.   Today feels 80% herself. No vomiting, fever, constipation. Eating slight decreased however drinking normally.   Wasn't really sure had the flu 3 one month ago. Wasn't sure if test accurate.   Colonoscopy up-to-date, mammogram up-to-date  Seen in urgent care 5 days ago and treated for UTI.  UC 03/12/20 diagnosed with influenza positive, covid negative. Fever resolved.   No diverticulitis No cholecystectomy, appendectomy   ROS: See pertinent positives and negatives per HPI.  Past Medical History:   Diagnosis Date  . Arthritis   . Complication of anesthesia    hard to wake up  . Coronary artery disease 11/2018   calcification in LAD and RCA in screening lung scan   . Family history of malignant neoplasm of breast    Maternal/paternal aunts and cousin  . Hypertension   . Lump or mass in breast 2011/2012   benign  . Obesity, unspecified   . Osteopenia    took Boniva 1 1/2 years in past-doesn't want to restart it  . Personal history of tobacco use, presenting hazards to health   . PONV (postoperative nausea and vomiting)   . Positive PPD    as a child. xray was normal.  . Systemic sclerosis [facility])     Past Surgical History:  Procedure Laterality Date  . APPENDECTOMY  1961  . BREAST BIOPSY Right Nov 2011   benign  . BREAST BIOPSY Left Nov 2012   dilated ducts-benign  . BUNIONECTOMY Bilateral 2010   x 2  . CHOLECYSTECTOMY N/A 10/03/2019   Procedure: LAPAROSCOPIC CHOLECYSTECTOMY;  Surgeon: Fredirick Maudlin, MD;  Location: [facility] ORS;  Service: General;  Laterality: N/A;  . COLONOSCOPY  8/05   Dr. Vira Agar  . COLONOSCOPY WITH PROPOFOL N/A 01/10/2019   Procedure: COLONOSCOPY WITH PROPOFOL;  Surgeon: Virgel Manifold, MD;  Location: [facility] ENDOSCOPY;  Service: Endoscopy;  Laterality: N/A;  . DECOMPRESSIVE LUMBAR LAMINECTOMY LEVEL 2 N/A 04/30/2015   Procedure: CENTRAL DECOMPRESSIVE LUMBER LAMINECTOMY WITH POSSIBLE FORAMINOTOMIES L3-L4,L4-L5;  Surgeon: Latanya Maudlin, MD;  Location: [facility] ORS;  Service: Orthopedics;  Laterality: N/A;  . TONSILLECTOMY  1968  . TUBAL LIGATION  1974    Family History  Problem Relation Age of Onset  . Lung cancer Father   . Heart disease Father 32       Cardiac Arrest  . Heart disease Mother   . Hypertension Mother   . Stroke Mother   . Other Brother        blood clots  . Other Other        hypercoag-family history  . Cancer Maternal Aunt        Breast cancer  . Breast cancer Maternal Aunt 73  . Breast cancer Paternal Aunt 72  . Breast cancer  Cousin        Current Outpatient Medications:  .  Calcium Citrate-Vitamin D (CALCIUM CITRATE +D PO), Take 2 tablets by mouth daily., Disp: , Rfl:  .  Cholecalciferol (VITAMIN D) 50 MCG (2000 UT) CAPS, Take 2,000 Units by mouth daily., Disp: , Rfl:  .  hydrochlorothiazide (HYDRODIURIL) 25 MG tablet, Take 0.5 tablets (12.5 mg total) by mouth daily., Disp: 90 tablet, Rfl: 3 .  omeprazole (PRILOSEC) 20 MG capsule, Take 2 capsules (40 mg total) by mouth daily. Take an hour before meal, Disp: 180 capsule, Rfl: 1 .  rosuvastatin (CRESTOR) 10 MG tablet, Take 1 tablet (10 mg total) by mouth every morning., Disp: 90 tablet, Rfl: 1 .  ibuprofen (ADVIL) 800 MG tablet, Take 1 tablet (800 mg total) by mouth every 8 (eight) hours as needed., Disp: 30 tablet, Rfl: 0 .  potassium chloride (KLOR-CON) 10 MEQ tablet, TAKE 1 TABLET BY MOUTH TWICE DAILY FOR 3 DAYS THEN TAKE 1 TABLET BY MOUTH EVERY OTHER DAY THEREAFTER AS DIRECTED, Disp: 45 tablet, Rfl: 0  EXAM:  VITALS per patient if applicable:  GENERAL: alert, oriented, appears well and in no acute distress  HEENT: atraumatic, conjunttiva clear, no obvious abnormalities on inspection of external nose and ears  NECK: normal movements of the head and neck  LUNGS: on inspection no signs of respiratory distress, breathing rate appears normal, no obvious gross SOB, gasping or wheezing  CV: no obvious cyanosis  MS: moves all visible extremities without noticeable abnormality  PSYCH/NEURO: pleasant and cooperative, no obvious depression or anxiety, speech and thought processing grossly intact  ASSESSMENT AND PLAN:  Discussed the following assessment and plan:  Epigastric pain - Plan: CT ABDOMEN PELVIS W CONTRAST, Urinalysis, Routine w reflex microscopic, Urine Culture, CBC with Differential/Platelet, Comprehensive metabolic panel, Amylase, Lipase, blood Problem List Items Addressed This Visit      Other   Epigastric pain - Primary    Acute on chronic.   Epigastric pain with associated nausea.  Differentials at this time include peptic ulcer disease, pyelonephritis, gastritis, GERD.  Of note, recent suspected UTI treated in urgent care (unable to UA, and no urine culture collected). pending labs, CT abdomen and pelvis      Relevant Orders   CT ABDOMEN PELVIS W CONTRAST (Completed)   Urinalysis, Routine w reflex microscopic (Completed)   Urine Culture (Completed)   CBC with Differential/Platelet (Completed)   Comprehensive metabolic panel (Completed)   Amylase (Completed)   Lipase, blood (Completed)      -we discussed possible serious and likely etiologies, options for evaluation and workup, limitations of telemedicine visit vs in person visit, treatment, treatment risks and precautions. Pt prefers to treat via telemedicine empirically rather then risking or undertaking an in person visit at this moment. Patient agrees to seek prompt in person care if worsening, new symptoms arise, or if is not  improving with treatment.   I discussed the assessment and treatment plan with the patient. The patient was provided an opportunity to ask questions and all were answered. The patient agreed with the plan and demonstrated an understanding of the instructions.   The patient was advised to call back or seek an in-person evaluation if the symptoms worsen or if the condition fails to improve as anticipated.   Mable Paris, FNP

## 2020-04-10 ENCOUNTER — Other Ambulatory Visit: Payer: Self-pay | Admitting: Family

## 2020-04-10 DIAGNOSIS — R8290 Unspecified abnormal findings in urine: Secondary | ICD-10-CM

## 2020-04-10 LAB — URINE CULTURE: Culture: NO GROWTH

## 2020-04-11 NOTE — Assessment & Plan Note (Addendum)
Acute on chronic.  Epigastric pain with associated nausea.  Differentials at this time include peptic ulcer disease, pyelonephritis, gastritis, GERD.  Of note, recent suspected UTI treated in urgent care (unable to UA, and no urine culture collected). pending labs, CT abdomen and pelvis

## 2020-04-12 ENCOUNTER — Other Ambulatory Visit: Payer: Self-pay | Admitting: Family

## 2020-04-12 DIAGNOSIS — I10 Essential (primary) hypertension: Secondary | ICD-10-CM

## 2020-04-14 ENCOUNTER — Encounter: Payer: Self-pay | Admitting: Family

## 2020-04-14 ENCOUNTER — Other Ambulatory Visit: Payer: Self-pay

## 2020-04-14 ENCOUNTER — Ambulatory Visit (INDEPENDENT_AMBULATORY_CARE_PROVIDER_SITE_OTHER): Payer: PPO | Admitting: Family

## 2020-04-14 VITALS — BP 118/78 | HR 75 | Temp 95.8°F | Ht 63.0 in | Wt 162.0 lb

## 2020-04-14 DIAGNOSIS — R748 Abnormal levels of other serum enzymes: Secondary | ICD-10-CM

## 2020-04-14 NOTE — Patient Instructions (Signed)
Nice to see you.  Please stay very vigilant and let me know if any symptoms were to change or new symptoms develop   will be very much in touch!

## 2020-04-14 NOTE — Progress Notes (Signed)
Subjective:    Patient ID: Valerie Curry, female    DOB: 06-06-1946, 74 y.o.   MRN: [id number]  CC: ALEINA CONTANT is a 74 y.o. female who presents today for follow up.   HPI: Here to to discuss CT abdomen and pelvis as well as labs.  No abdominal pain this past week.   When pain occurs, it can be moderate to severe describing that it has a usual pattern of startimg midline lower abdomen and radiates up to epigastric area.   No nausea, constipation.  Has regular bowel movements   Pelvic floor prolapse- has seen Dr Amalia Hailey whom recommended hysterectomy. She has decided to defer this at this time.   On hctz 12.5mg . Completed Kcl.  No tylenol, alcohol.  History of cholecystectomy,appendectomy Mild intrahepatic biliary ductal dilatation Subcentimeter right renal hypodense lesions Diverticula.   Laxity of the pelvic floor. Grade 1 L3-L4 anteriorlisthesis Moderate stool burden UA -small hemoglobin  HISTORY:  Past Medical History:  Diagnosis Date  . Arthritis   . Complication of anesthesia    hard to wake up  . Coronary artery disease 11/2018   calcification in LAD and RCA in screening lung scan   . Family history of malignant neoplasm of breast    Maternal/paternal aunts and cousin  . Hypertension   . Lump or mass in breast 2011/2012   benign  . Obesity, unspecified   . Osteopenia    took Boniva 1 1/2 years in past-doesn't want to restart it  . Personal history of tobacco use, presenting hazards to health   . PONV (postoperative nausea and vomiting)   . Positive PPD    as a child. xray was normal.  . Systemic sclerosis [facility])    Past Surgical History:  Procedure Laterality Date  . APPENDECTOMY  1961  . BREAST BIOPSY Right Nov 2011   benign  . BREAST BIOPSY Left Nov 2012   dilated ducts-benign  . BUNIONECTOMY Bilateral 2010   x 2  . CHOLECYSTECTOMY N/A 10/03/2019   Procedure: LAPAROSCOPIC CHOLECYSTECTOMY;  Surgeon: Fredirick Maudlin, MD;  Location: [facility] ORS;   Service: General;  Laterality: N/A;  . COLONOSCOPY  8/05   Dr. Vira Agar  . COLONOSCOPY WITH PROPOFOL N/A 01/10/2019   Procedure: COLONOSCOPY WITH PROPOFOL;  Surgeon: Virgel Manifold, MD;  Location: [facility] ENDOSCOPY;  Service: Endoscopy;  Laterality: N/A;  . DECOMPRESSIVE LUMBAR LAMINECTOMY LEVEL 2 N/A 04/30/2015   Procedure: CENTRAL DECOMPRESSIVE LUMBER LAMINECTOMY WITH POSSIBLE FORAMINOTOMIES L3-L4,L4-L5;  Surgeon: Latanya Maudlin, MD;  Location: [facility] ORS;  Service: Orthopedics;  Laterality: N/A;  . TONSILLECTOMY  1968  . TUBAL LIGATION  1974   Family History  Problem Relation Age of Onset  . Lung cancer Father   . Heart disease Father 69       Cardiac Arrest  . Heart disease Mother   . Hypertension Mother   . Stroke Mother   . Other Brother        blood clots  . Other Other        hypercoag-family history  . Cancer Maternal Aunt        Breast cancer  . Breast cancer Maternal Aunt 91  . Breast cancer Paternal Aunt 39  . Breast cancer Cousin     Allergies: Patient has no known allergies. Current Outpatient Medications on File Prior to Visit  Medication Sig Dispense Refill  . Calcium Citrate-Vitamin D (CALCIUM CITRATE +D PO) Take 2 tablets by mouth daily.    . Cholecalciferol (  VITAMIN D) 50 MCG (2000 UT) CAPS Take 2,000 Units by mouth daily.    . hydrochlorothiazide (HYDRODIURIL) 25 MG tablet TAKE 1/2 TABLET BY MOUTH DAILY 90 tablet 3  . ibuprofen (ADVIL) 800 MG tablet Take 1 tablet (800 mg total) by mouth every 8 (eight) hours as needed. 30 tablet 0  . omeprazole (PRILOSEC) 20 MG capsule Take 2 capsules (40 mg total) by mouth daily. Take an hour before meal 180 capsule 1  . potassium chloride (KLOR-CON) 10 MEQ tablet TAKE 1 TABLET BY MOUTH TWICE DAILY FOR 3 DAYS THEN TAKE 1 TABLET BY MOUTH EVERY OTHER DAY THEREAFTER AS DIRECTED 45 tablet 0  . rosuvastatin (CRESTOR) 10 MG tablet Take 1 tablet (10 mg total) by mouth every morning. 90 tablet 1   No current facility-administered  medications on file prior to visit.    Social History   Tobacco Use  . Smoking status: Former Smoker    Packs/day: 1.00    Years: 50.00    Pack years: 50.00    Types: Cigarettes    Quit date: 11/29/2014    Years since quitting: 5.3  . Smokeless tobacco: Former Systems developer    Quit date: 04/30/2015  Substance Use Topics  . Alcohol use: Yes    Alcohol/week: 0.0 standard drinks    Comment: occasional glassof wine   . Drug use: No    Review of Systems    Objective:    BP 118/78   Pulse 75   Temp (!) 95.8 F (35.4 C) (Temporal)   Ht 5\' 3"  (1.6 m)   Wt 162 lb (73.5 kg)   SpO2 99%   BMI 28.70 kg/m  BP Readings from Last 3 Encounters:  04/14/20 118/78  03/12/20 137/83  02/05/20 118/78   Wt Readings from Last 3 Encounters:  04/14/20 162 lb (73.5 kg)  04/09/20 155 lb (70.3 kg)  03/12/20 160 lb (72.6 kg)    Physical Exam     Assessment & Plan:   Problem List Items Addressed This Visit      Other   Elevated liver enzymes - Primary    Please alkaline phosphatase, liver enzymes improved. She has had no further nausea, abdominal pain since her last visit.  Patient is well-appearing today and in no acute distress.  Benign abdominal exam.  Discussed at great length  biliary ductal dilatation and my concern elevated liver enzymes.  Pending repeat urinalysis.  Consult with GI as I have consulted with Dr Juanda Crumble and also renal ultrasound.      Relevant Orders   CA 125 (Completed)   Comprehensive metabolic panel (Completed)   US Renal       I am having Valerie Curry maintain her Vitamin D, Calcium Citrate-Vitamin D (CALCIUM CITRATE +D PO), ibuprofen, rosuvastatin, omeprazole, potassium chloride, and hydrochlorothiazide.   No orders of the defined types were placed in this encounter.   Return precautions given.   Risks, benefits, and alternatives of the medications and treatment plan prescribed today were discussed, and patient expressed understanding.   Education  regarding symptom management and diagnosis given to patient on AVS.  Continue to follow with Burnard Hawthorne, FNP for routine health maintenance.   Valerie Curry and I agreed with plan.   Mable Paris, FNP

## 2020-04-15 LAB — COMPREHENSIVE METABOLIC PANEL
ALT: 67 U/L — ABNORMAL HIGH (ref 0–35)
AST: 36 U/L (ref 0–37)
Albumin: 4 g/dL (ref 3.5–5.2)
Alkaline Phosphatase: 125 U/L — ABNORMAL HIGH (ref 39–117)
BUN: 25 mg/dL — ABNORMAL HIGH (ref 6–23)
CO2: 33 mEq/L — ABNORMAL HIGH (ref 19–32)
Calcium: 9.4 mg/dL (ref 8.4–10.5)
Chloride: 103 mEq/L (ref 96–112)
Creatinine, Ser: 0.71 mg/dL (ref 0.40–1.20)
GFR: 80.61 mL/min (ref >60.00)
Glucose, Bld: 84 mg/dL (ref 70–99)
Potassium: 4 mEq/L (ref 3.5–5.1)
Sodium: 139 mEq/L (ref 135–145)
Total Bilirubin: 0.5 mg/dL (ref 0.2–1.2)
Total Protein: 6.5 g/dL (ref 6.0–8.3)

## 2020-04-15 LAB — CA 125: CA 125: 8 U/mL (ref <35)

## 2020-04-15 NOTE — Assessment & Plan Note (Addendum)
Please alkaline phosphatase, liver enzymes improved. She has had no further nausea, abdominal pain since her last visit.  Patient is well-appearing today and in no acute distress.  Benign abdominal exam.  Discussed at great length  biliary ductal dilatation and my concern elevated liver enzymes.  Pending repeat urinalysis.  Consult with GI as I have consulted with Dr Juanda Crumble and also renal ultrasound.

## 2020-04-16 ENCOUNTER — Other Ambulatory Visit: Payer: PPO

## 2020-04-16 ENCOUNTER — Other Ambulatory Visit: Payer: Self-pay

## 2020-04-16 ENCOUNTER — Telehealth: Payer: Self-pay | Admitting: Family

## 2020-04-16 ENCOUNTER — Encounter: Payer: Self-pay | Admitting: Family

## 2020-04-16 ENCOUNTER — Telehealth: Payer: Self-pay

## 2020-04-16 DIAGNOSIS — E876 Hypokalemia: Secondary | ICD-10-CM

## 2020-04-16 NOTE — Telephone Encounter (Signed)
Patient was contacted to let her know that her appointment was scheduled with Dr. Bonna Gains at 1:30 PM at our [facility] location. Patient agreed and had no further questions.

## 2020-04-16 NOTE — Telephone Encounter (Signed)
-----   Message from Burnard Hawthorne, [place] sent at 04/16/2020  8:44 AM EDT ----- Thanks so much Dr Bonna Gains. I have made pt aware that your office would be calling ----- Message ----- From: Virgel Manifold, MD Sent: 04/15/2020   2:21 PM EDT To: Wayna Chalet, CMA, Burnard Hawthorne, FNP  Thank you for letting me know. Herb Grays, can you please have her scheduled to see me asap in person. Ok to [place] ----- Message ----- From: Burnard Hawthorne, FNP Sent: 04/15/2020   1:34 PM EDT To: Virgel Manifold, MD  Dr Bonna Gains,   [facility] that you are doing well.  I wanted to loop you in with a mutual patient who is been struggling with abdominal pain, with one bout of nausea and vomiting.  Liver enzymes trending down.  Notably, CT of abdomen pelvis revealed biliary dilatation.  She has a history of cholecystectomy.  I certainly planned to get her scheduled with you again and wanted to ensure that was appropriate.   If anything you think that be helpful on my end, please let me know and I am happy to order.  Thanks always for your help and Ill have my nurse call your office once I hear from you.  Joycelyn Schmid, NP

## 2020-04-16 NOTE — Telephone Encounter (Signed)
I spoke with pt to sch she stated Valerie Curry discussed with her to push out until after she speaks to gastro. Pt is waiting for ofc to call pt. Pt will call after she speaks to gastro

## 2020-04-17 ENCOUNTER — Ambulatory Visit: Payer: PPO | Admitting: Gastroenterology

## 2020-04-17 ENCOUNTER — Encounter: Payer: Self-pay | Admitting: Gastroenterology

## 2020-04-17 ENCOUNTER — Other Ambulatory Visit: Payer: Self-pay

## 2020-04-17 VITALS — BP 135/85 | HR 85 | Temp 97.5°F | Ht 62.0 in | Wt 158.6 lb

## 2020-04-17 DIAGNOSIS — R1084 Generalized abdominal pain: Secondary | ICD-10-CM

## 2020-04-17 DIAGNOSIS — M7070 Other bursitis of hip, unspecified hip: Secondary | ICD-10-CM

## 2020-04-17 DIAGNOSIS — R748 Abnormal levels of other serum enzymes: Secondary | ICD-10-CM

## 2020-04-17 HISTORY — DX: Other bursitis of hip, unspecified hip: M70.70

## 2020-04-17 NOTE — Patient Instructions (Signed)
Please increase your Prilosec 40 MG to twice a day.

## 2020-04-18 NOTE — Progress Notes (Signed)
Valerie Antigua, MD [address]  [place]  [place], [place] [address]  Main: [phone]  Fax: [phone]   Primary Care Physician: Burnard Hawthorne, FNP   Chief Complaint  Patient presents with  . New Patient (Initial Visit)  . Abdominal Pain    Patient stated that for the past year she has had abdominal pain and sometimes she would have nausea and vomiting.    HPI: Valerie Curry is a 74 y.o. female with abdominal pain and elevated liver enzymes.  Patient underwent laparoscopic cholecystectomy in November 2020.  However, continues to have pain.  Recent CT scan showsUnremarkable liver.  Mild intrahepatic bile duct dilation.  No stones in central CBD.Pelvic floor laxity was also reported.  Patient states she has seen the specialist for this but since repair is not urgent, and her abdominal discomfort is bothering her more, surgical repair for this is on hold.  Previous history:  Last colonoscopy was in July 2015 with Dr. Jamal Collin.  It was done for screening and was reported to be normal.  No biopsies were done at that time.  Patient denies any family history of colon cancer.  Primary care provider had ordered stool test and celiac panel.  Celiac panel was normal.  GI stool panel was indeterminate.  Current Outpatient Medications  Medication Sig Dispense Refill  . Cholecalciferol (VITAMIN D) 50 MCG (2000 UT) CAPS Take 2,000 Units by mouth daily.    . hydrochlorothiazide (HYDRODIURIL) 25 MG tablet TAKE 1/2 TABLET BY MOUTH DAILY 90 tablet 3  . ibuprofen (ADVIL) 800 MG tablet Take 1 tablet (800 mg total) by mouth every 8 (eight) hours as needed. 30 tablet 0  . omeprazole (PRILOSEC) 20 MG capsule Take 2 capsules (40 mg total) by mouth daily. Take an hour before meal (Patient taking differently: Take 40 mg by mouth 2 (two) times daily before a meal. Take an hour before meal) 180 capsule 1  . potassium chloride (KLOR-CON) 10 MEQ tablet TAKE 1 TABLET BY MOUTH TWICE DAILY FOR  3 DAYS THEN TAKE 1 TABLET BY MOUTH EVERY OTHER DAY THEREAFTER AS DIRECTED 45 tablet 0  . rosuvastatin (CRESTOR) 10 MG tablet Take 1 tablet (10 mg total) by mouth every morning. 90 tablet 1   No current facility-administered medications for this visit.    Allergies as of 04/17/2020  . (No Known Allergies)    ROS:  General: Negative for anorexia, weight loss, fever, chills, fatigue, weakness. ENT: Negative for hoarseness, difficulty swallowing , nasal congestion. CV: Negative for chest pain, angina, palpitations, dyspnea on exertion, peripheral edema.  Respiratory: Negative for dyspnea at rest, dyspnea on exertion, cough, sputum, wheezing.  GI: See history of present illness. GU:  Negative for dysuria, hematuria, urinary incontinence, urinary frequency, nocturnal urination.  Endo: Negative for unusual weight change.    Physical Examination:   BP 135/85   Pulse 85   Temp (!) 97.5 F (36.4 C) (Oral)   Ht 5\' 2"  (1.575 m)   Wt 158 lb 9.6 oz (71.9 kg)   BMI 29.01 kg/m   General: Well-nourished, well-developed in no acute distress.  Eyes: No icterus. Conjunctivae pink. Mouth: Oropharyngeal mucosa moist and pink , no lesions erythema or exudate. Neck: Supple, Trachea midline Abdomen: Bowel sounds are normal, nontender, nondistended, no hepatosplenomegaly or masses, no abdominal bruits or hernia , no rebound or guarding.   Extremities: No lower extremity edema. No clubbing or deformities. Neuro: Alert and oriented x 3.  Grossly intact. Skin:  Warm and dry, no jaundice.   Psych: Alert and cooperative, normal mood and affect.   Labs: CMP     Component Value Date/Time   NA 139 04/14/2020 1652   NA 139 10/09/2015 0000   K 4.0 04/14/2020 1652   CL 103 04/14/2020 1652   CO2 33 (H) 04/14/2020 1652   GLUCOSE 84 04/14/2020 1652   BUN 25 (H) 04/14/2020 1652   BUN 18 10/09/2015 0000   CREATININE 0.71 04/14/2020 1652   CALCIUM 9.4 04/14/2020 1652   PROT 6.5 04/14/2020 1652   ALBUMIN  4.0 04/14/2020 1652   AST 36 04/14/2020 1652   ALT 67 (H) 04/14/2020 1652   ALKPHOS 125 (H) 04/14/2020 1652   BILITOT 0.5 04/14/2020 1652   GFRNONAA >60 04/09/2020 1435   GFRAA >60 04/09/2020 1435   Lab Results  Component Value Date   WBC 4.5 04/09/2020   HGB 13.4 04/09/2020   HCT 40.1 04/09/2020   MCV 91.3 04/09/2020   PLT 224 04/09/2020    Imaging Studies: CT ABDOMEN PELVIS W CONTRAST  Result Date: 04/09/2020 CLINICAL DATA:  74 year old female with epigastric pain and fever. EXAM: CT ABDOMEN AND PELVIS WITH CONTRAST TECHNIQUE: Multidetector CT imaging of the abdomen and pelvis was performed using the standard protocol following bolus administration of intravenous contrast. CONTRAST:  17mL OMNIPAQUE IOHEXOL 300 MG/ML  SOLN COMPARISON:  Abdominal ultrasound dated 09/14/2019. FINDINGS: Lower chest: Bibasilar linear atelectasis/scarring. The visualized lung bases are otherwise clear. No intra-abdominal free air or free fluid. Hepatobiliary: The liver is unremarkable. Cholecystectomy. There is mild intrahepatic biliary ductal dilatation. No retained calcified stone noted in the central CBD. Pancreas: Unremarkable. No pancreatic ductal dilatation or surrounding inflammatory changes. Spleen: Normal in size without focal abnormality. Adrenals/Urinary Tract: The adrenal glands are unremarkable. There is no hydronephrosis on either side. There is symmetric enhancement and excretion of contrast by both kidneys. Subcentimeter right renal hypodense lesions are too small to characterize. The visualized ureters and urinary bladder appear unremarkable. Stomach/Bowel: Small scattered sigmoid diverticula without active inflammatory changes. There is moderate stool throughout the colon. There is no bowel obstruction or active inflammation. Appendectomy. Vascular/Lymphatic: The abdominal aorta and IVC are unremarkable. No portal venous gas. There is no adenopathy. Reproductive: The uterus appears small which may  be atrophic or related partial hysterectomy. Clinical correlation is recommended. Other: There is apparent laxity of the pelvic floor with probable slight prolapse of the vagina and uterus. Musculoskeletal: Grade 1 L3-L4 anterolisthesis. No acute osseous pathology. IMPRESSION: 1. No acute intra-abdominal or pelvic pathology. 2. Moderate colonic stool burden. No bowel obstruction. 3. Sigmoid diverticulosis. 4. Pelvic floor laxity with probable slight prolapse of the vagina and uterus. Clinical correlation is recommended. Electronically Signed   By: Anner Crete M.D.   On: 04/09/2020 14:24    Assessment and Plan:   VELICIA WALKER is a 74 y.o. y/o female with abdominal pain  Check H. pylori serology for abdominal discomfort Patient also had elevated liver enzymes on Apr 09, 2020.  She was started on simvastatin in December 2020.  Question if this is from that.  However, will need to first rule out other etiologies.  Obtain blood complete liver work-up  Avoid hepatotoxic drugs Discuss with primary care provider about switching her simvastatin or holding it if no other etiologies of elevated liver enzymes evident  Dr Valerie Curry

## 2020-04-20 LAB — ANA: ANA Titer 1: NEGATIVE

## 2020-04-20 LAB — IRON AND TIBC
Iron Saturation: 14 % — ABNORMAL LOW (ref 15–55)
Iron: 47 ug/dL (ref 27–139)
Total Iron Binding Capacity: 344 ug/dL (ref 250–450)
UIBC: 297 ug/dL (ref 118–369)

## 2020-04-20 LAB — H PYLORI, IGM, IGG, IGA AB
H pylori, IgM Abs: <9 units (ref 0.0–8.9)
H. pylori, IgA Abs: <9 units (ref 0.0–8.9)
H. pylori, IgG AbS: 0.09 Index Value (ref 0.00–0.79)

## 2020-04-20 LAB — GAMMA GT: GGT: 242 IU/L — ABNORMAL HIGH (ref 0–60)

## 2020-04-20 LAB — HEPATITIS B SURFACE ANTIBODY,QUALITATIVE: Hep B Surface Ab, Qual: NONREACTIVE

## 2020-04-20 LAB — HEPATITIS B CORE ANTIBODY, IGM: Hep B C IgM: NEGATIVE

## 2020-04-20 LAB — FERRITIN: Ferritin: 271 ng/mL — ABNORMAL HIGH (ref 15–150)

## 2020-04-20 LAB — HEPATITIS C ANTIBODY (REFLEX): HCV Ab: <0.1 s/co ratio (ref 0.0–0.9)

## 2020-04-20 LAB — HCV RNA QUANT: Hepatitis C Quantitation: NOT DETECTED IU/mL

## 2020-04-20 LAB — HEPATITIS A ANTIBODY, TOTAL: hep A Total Ab: NEGATIVE

## 2020-04-20 LAB — ANTI-MICROSOMAL ANTIBODY LIVER / KIDNEY: LKM1 Ab: 1 Units (ref 0.0–20.0)

## 2020-04-20 LAB — HEPATITIS B CORE ANTIBODY, TOTAL: Hep B Core Total Ab: NEGATIVE

## 2020-04-20 LAB — HCV COMMENT:

## 2020-04-20 LAB — HEPATITIS B SURFACE ANTIGEN: Hepatitis B Surface Ag: NEGATIVE

## 2020-04-20 LAB — MITOCHONDRIAL/SMOOTH MUSCLE AB PNL
Mitochondrial Ab: <20 Units (ref 0.0–20.0)
Smooth Muscle Ab: 4 Units (ref 0–19)

## 2020-04-20 LAB — HEPATITIS A ANTIBODY, IGM: Hep A IgM: NEGATIVE

## 2020-04-20 LAB — IGG: IgG (Immunoglobin G), Serum: 686 mg/dL (ref 586–1602)

## 2020-04-22 ENCOUNTER — Telehealth: Payer: Self-pay

## 2020-04-22 NOTE — Telephone Encounter (Signed)
-----   Message from Virgel Manifold, MD sent at 04/21/2020 10:00 AM EDT ----- Valerie Curry please let the patient know, her iron saturation is low and ferritin is high and I would recommend that she see hematology. Her liver enzymes on may 17th were improved and we need to see the trend. Please have her get hepatic function panel and ceruloplasmin levels in 3 weeks from today.

## 2020-04-22 NOTE — Telephone Encounter (Signed)
Pt notified of lab results via mychart. 

## 2020-04-23 ENCOUNTER — Other Ambulatory Visit: Payer: Self-pay

## 2020-04-23 DIAGNOSIS — E611 Iron deficiency: Secondary | ICD-10-CM

## 2020-04-23 DIAGNOSIS — R748 Abnormal levels of other serum enzymes: Secondary | ICD-10-CM

## 2020-05-01 ENCOUNTER — Other Ambulatory Visit: Payer: Self-pay

## 2020-05-01 ENCOUNTER — Other Ambulatory Visit (INDEPENDENT_AMBULATORY_CARE_PROVIDER_SITE_OTHER): Payer: PPO

## 2020-05-01 DIAGNOSIS — R8290 Unspecified abnormal findings in urine: Secondary | ICD-10-CM

## 2020-05-01 DIAGNOSIS — E876 Hypokalemia: Secondary | ICD-10-CM

## 2020-05-01 LAB — URINALYSIS, ROUTINE W REFLEX MICROSCOPIC
Bilirubin Urine: NEGATIVE
Hgb urine dipstick: NEGATIVE
Ketones, ur: NEGATIVE
Leukocytes,Ua: NEGATIVE
Nitrite: NEGATIVE
RBC / HPF: NONE SEEN (ref 0–?)
Specific Gravity, Urine: <=1.005 — AB (ref 1.000–1.030)
Total Protein, Urine: NEGATIVE
Urine Glucose: NEGATIVE
Urobilinogen, UA: 0.2 (ref 0.0–1.0)
WBC, UA: NONE SEEN (ref 0–?)
pH: 6 (ref 5.0–8.0)

## 2020-05-01 LAB — COMPREHENSIVE METABOLIC PANEL
ALT: 123 U/L — ABNORMAL HIGH (ref 0–35)
AST: 54 U/L — ABNORMAL HIGH (ref 0–37)
Albumin: 3.9 g/dL (ref 3.5–5.2)
Alkaline Phosphatase: 151 U/L — ABNORMAL HIGH (ref 39–117)
BUN: 21 mg/dL (ref 6–23)
CO2: 31 mEq/L (ref 19–32)
Calcium: 9.3 mg/dL (ref 8.4–10.5)
Chloride: 102 mEq/L (ref 96–112)
Creatinine, Ser: 0.76 mg/dL (ref 0.40–1.20)
GFR: 74.51 mL/min (ref >60.00)
Glucose, Bld: 85 mg/dL (ref 70–99)
Potassium: 3.9 mEq/L (ref 3.5–5.1)
Sodium: 139 mEq/L (ref 135–145)
Total Bilirubin: 0.6 mg/dL (ref 0.2–1.2)
Total Protein: 6.2 g/dL (ref 6.0–8.3)

## 2020-05-02 ENCOUNTER — Encounter: Payer: Self-pay | Admitting: Family

## 2020-05-02 ENCOUNTER — Ambulatory Visit: Payer: PPO | Admitting: Family

## 2020-05-05 ENCOUNTER — Telehealth: Payer: Self-pay

## 2020-05-05 NOTE — Telephone Encounter (Signed)
-----   Message from Burnard Hawthorne, [place] sent at 05/05/2020 12:53 PM EDT ----- Call pt I heard back from Dr Enriqueta Shutter , GI.  Whom advised that we Hold simvastatin  dc from her chart please Advise her to schedule follow up with me in august since she sees Dr Enriqueta Shutter in June. If Dr Elveria Rising doesn't do liver enzymes lab during her upcoming visit , let me know as I would like them done prior to our follow up in august .

## 2020-05-06 ENCOUNTER — Encounter: Payer: Self-pay | Admitting: Oncology

## 2020-05-06 ENCOUNTER — Other Ambulatory Visit: Payer: Self-pay

## 2020-05-06 ENCOUNTER — Inpatient Hospital Stay: Payer: PPO | Attending: Oncology | Admitting: Oncology

## 2020-05-06 ENCOUNTER — Inpatient Hospital Stay: Payer: PPO

## 2020-05-06 VITALS — BP 128/84 | HR 78 | Temp 97.6°F | Resp 16 | Wt 161.1 lb

## 2020-05-06 DIAGNOSIS — R790 Abnormal level of blood mineral: Secondary | ICD-10-CM | POA: Diagnosis not present

## 2020-05-06 DIAGNOSIS — Z803 Family history of malignant neoplasm of breast: Secondary | ICD-10-CM | POA: Diagnosis not present

## 2020-05-06 DIAGNOSIS — R7989 Other specified abnormal findings of blood chemistry: Secondary | ICD-10-CM

## 2020-05-06 DIAGNOSIS — Z801 Family history of malignant neoplasm of trachea, bronchus and lung: Secondary | ICD-10-CM | POA: Diagnosis not present

## 2020-05-06 DIAGNOSIS — Z87891 Personal history of nicotine dependence: Secondary | ICD-10-CM | POA: Diagnosis not present

## 2020-05-06 LAB — HEPATIC FUNCTION PANEL
ALT: 97 U/L — ABNORMAL HIGH (ref 0–44)
AST: 56 U/L — ABNORMAL HIGH (ref 15–41)
Albumin: 4.1 g/dL (ref 3.5–5.0)
Alkaline Phosphatase: 139 U/L — ABNORMAL HIGH (ref 38–126)
Bilirubin, Direct: 0.1 mg/dL (ref 0.0–0.2)
Indirect Bilirubin: 0.5 mg/dL (ref 0.3–0.9)
Total Bilirubin: 0.6 mg/dL (ref 0.3–1.2)
Total Protein: 6.6 g/dL (ref 6.5–8.1)

## 2020-05-06 LAB — CBC WITH DIFFERENTIAL/PLATELET
Abs Immature Granulocytes: 0.01 10*3/uL (ref 0.00–0.07)
Basophils Absolute: 0 10*3/uL (ref 0.0–0.1)
Basophils Relative: 1 %
Eosinophils Absolute: 0.1 10*3/uL (ref 0.0–0.5)
Eosinophils Relative: 2 %
HCT: 38.4 % (ref 36.0–46.0)
Hemoglobin: 13 g/dL (ref 12.0–15.0)
Immature Granulocytes: 0 %
Lymphocytes Relative: 41 %
Lymphs Abs: 1.8 10*3/uL (ref 0.7–4.0)
MCH: 31.3 pg (ref 26.0–34.0)
MCHC: 33.9 g/dL (ref 30.0–36.0)
MCV: 92.5 fL (ref 80.0–100.0)
Monocytes Absolute: 0.5 10*3/uL (ref 0.1–1.0)
Monocytes Relative: 11 %
Neutro Abs: 2 10*3/uL (ref 1.7–7.7)
Neutrophils Relative %: 45 %
Platelets: 203 10*3/uL (ref 150–400)
RBC: 4.15 MIL/uL (ref 3.87–5.11)
RDW: 13.3 % (ref 11.5–15.5)
WBC: 4.4 10*3/uL (ref 4.0–10.5)
nRBC: 0 % (ref 0.0–0.2)

## 2020-05-06 LAB — FERRITIN: Ferritin: 102 ng/mL (ref 11–307)

## 2020-05-06 LAB — IRON AND TIBC
Iron: 48 ug/dL (ref 28–170)
Saturation Ratios: 13 % (ref 10.4–31.8)
TIBC: 367 ug/dL (ref 250–450)
UIBC: 319 ug/dL

## 2020-05-06 NOTE — Progress Notes (Signed)
Hematology/Oncology Consult note [facility] [phone] [phone]   Patient Care Team: Burnard Hawthorne, FNP as PCP - General (Family Medicine) Kate Sable, MD as PCP - Cardiology (Cardiology)  REFERRING PROVIDER: Virgel Manifold, MD  CHIEF COMPLAINTS/REASON FOR VISIT:  Evaluation of abnormal iron testing  HISTORY OF PRESENTING ILLNESS:   Valerie Curry is a  74 y.o.  female with PMH listed below was seen in consultation at the request of  Virgel Manifold, MD  for evaluation of abnormal iron testing  Patient was recently seen by gastroenterologist for evaluation of elevated liver enzymes. Patient has had some chronic abdominal pain.  She underwent laparoscopic cholecystectomy in November 2020. Recent CT  abdomen pelvis showed no acute intra-abdominal or pelvic pathology.  Moderate colonic stool burden.  No bowel obstruction.  Sigmoid diverticulosis.  Pelvic floor laxity with probable slight prolapse of the vagina and uterus. Patient was taking simvastatin. Patient has blood work done on 05/01/2020, AST 54, ALT 123, alkaline phosphatase 151. 04/17/2020, iron saturation 14, ferritin 271.  TIBC 344 .  CBC showed normal hemoglobin of 13.4. Today patient has no specific concerns.  She reports feeling well.  She has been advised by gastroenterology that elevated liver enzymes may be secondary to statin drugs.  Simvastatin has been discontinued yesterday. Patient was referred to establish care with heme-onc for further evaluation of abnormal iron testing. Patient has chronic knee problems.  No other joint pain.  Denies any shortness of breath or chest pain. Review of Systems  Constitutional: Negative for appetite change, chills, fatigue and fever.  HENT:   Negative for hearing loss and voice change.   Eyes: Negative for eye problems.  Respiratory: Negative for chest tightness and cough.   Cardiovascular: Negative for chest pain.    Gastrointestinal: Negative for abdominal distention, abdominal pain and blood in stool.  Endocrine: Negative for hot flashes.  Genitourinary: Negative for difficulty urinating and frequency.   Musculoskeletal: Positive for arthralgias.  Skin: Negative for itching and rash.  Neurological: Negative for extremity weakness.  Hematological: Negative for adenopathy.  Psychiatric/Behavioral: Negative for confusion.    MEDICAL HISTORY:  Past Medical History:  Diagnosis Date  . Arthritis   . Complication of anesthesia    hard to wake up  . Coronary artery disease 11/2018   calcification in LAD and RCA in screening lung scan   . Family history of malignant neoplasm of breast    Maternal/paternal aunts and cousin  . Hypertension   . Lump or mass in breast 2011/2012   benign  . Obesity, unspecified   . Osteopenia    took Boniva 1 1/2 years in past-doesn't want to restart it  . Personal history of tobacco use, presenting hazards to health   . PONV (postoperative nausea and vomiting)   . Positive PPD    as a child. xray was normal.  . Systemic sclerosis ([place])     SURGICAL HISTORY: Past Surgical History:  Procedure Laterality Date  . APPENDECTOMY  1961  . BREAST BIOPSY Right Nov 2011   benign  . BREAST BIOPSY Left Nov 2012   dilated ducts-benign  . BUNIONECTOMY Bilateral 2010   x 2  . CHOLECYSTECTOMY N/A 10/03/2019   Procedure: LAPAROSCOPIC CHOLECYSTECTOMY;  Surgeon: Fredirick Maudlin, MD;  Location: [facility] ORS;  Service: General;  Laterality: N/A;  . COLONOSCOPY  8/05   Dr. Vira Agar  . COLONOSCOPY WITH PROPOFOL N/A 01/10/2019   Procedure: COLONOSCOPY WITH PROPOFOL;  Surgeon: Vonda Antigua  B, MD;  Location: [facility] ENDOSCOPY;  Service: Endoscopy;  Laterality: N/A;  . DECOMPRESSIVE LUMBAR LAMINECTOMY LEVEL 2 N/A 04/30/2015   Procedure: CENTRAL DECOMPRESSIVE LUMBER LAMINECTOMY WITH POSSIBLE FORAMINOTOMIES L3-L4,L4-L5;  Surgeon: Latanya Maudlin, MD;  Location: [facility] ORS;  Service:  Orthopedics;  Laterality: N/A;  . TONSILLECTOMY  1968  . TUBAL LIGATION  1974    SOCIAL HISTORY: Social History   Socioeconomic History  . Marital status: Married    Spouse name: Not on file  . Number of children: 2  . Years of education: 70  . Highest education level: Not on file  Occupational History  . Occupation: Facilities manager: [employer]  Tobacco Use  . Smoking status: Former Smoker    Packs/day: 1.00    Years: 50.00    Pack years: 50.00    Types: Cigarettes    Quit date: 11/29/2014    Years since quitting: 5.4  . Smokeless tobacco: Former Systems developer    Quit date: 04/30/2015  Substance and Sexual Activity  . Alcohol use: Yes    Alcohol/week: 0.0 standard drinks    Comment: occasional glassof wine   . Drug use: No  . Sexual activity: Yes    Birth control/protection: Post-menopausal  Other Topics Concern  . Not on file  Social History Narrative   Pt is 74yo female. Pt was born in [place], moved to [place] when she was 68.  Pt is married to husband of 22 years.  Pt has 2 sons from a previous marriage and 4 step sons and 65 Grandchildren.        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        Pt works out at gym 3/week (2 days a week in a class, 1 day a week with an Dietitian).  Pt enjoys reading, spending time with her family. Pt and her husband are members of [employer] and active in their church.    Social Determinants of Health   Financial Resource Strain:   . Difficulty of Paying Living Expenses:   Food Insecurity:   . Worried About Charity fundraiser in the Last Year:   . Arboriculturist in the Last Year:   Transportation Needs:   . Film/video editor (Medical):   Marland Kitchen Lack of Transportation (Non-Medical):   Physical Activity:   . Days of Exercise per Week:   . Minutes of Exercise per Session:   Stress:   . Feeling of Stress :   Social Connections:   . Frequency of  Communication with Friends and Family:   . Frequency of Social Gatherings with Friends and Family:   . Attends Religious Services:   . Active Member of Clubs or Organizations:   . Attends Archivist Meetings:   Marland Kitchen Marital Status:   Intimate Partner Violence:   . Fear of Current or Ex-Partner:   . Emotionally Abused:   Marland Kitchen Physically Abused:   . Sexually Abused:     FAMILY HISTORY: Family History  Problem Relation Age of Onset  . Lung cancer Father   . Heart disease Father 21       Cardiac Arrest  . Heart disease Mother   . Hypertension Mother   . Stroke Mother   . Other Brother        blood clots  . Other Other        hypercoag-family history  . Cancer Maternal Aunt  Breast cancer  . Breast cancer Maternal Aunt 74  . Breast cancer Paternal Aunt 36  . Breast cancer Cousin     ALLERGIES:  has No Known Allergies.  MEDICATIONS:  Current Outpatient Medications  Medication Sig Dispense Refill  . aspirin EC 81 MG tablet Take 81 mg by mouth daily.    . Cholecalciferol (VITAMIN D) 50 MCG (2000 UT) CAPS Take 2,000 Units by mouth daily.    . hydrochlorothiazide (HYDRODIURIL) 25 MG tablet TAKE 1/2 TABLET BY MOUTH DAILY 90 tablet 3  . ibuprofen (ADVIL) 800 MG tablet Take 1 tablet (800 mg total) by mouth every 8 (eight) hours as needed. 30 tablet 0  . omeprazole (PRILOSEC) 20 MG capsule Take 2 capsules (40 mg total) by mouth daily. Take an hour before meal (Patient taking differently: Take 40 mg by mouth 2 (two) times daily before a meal. Take an hour before meal) 180 capsule 1  . potassium chloride (KLOR-CON) 10 MEQ tablet TAKE 1 TABLET BY MOUTH TWICE DAILY FOR 3 DAYS THEN TAKE 1 TABLET BY MOUTH EVERY OTHER DAY THEREAFTER AS DIRECTED 45 tablet 0   No current facility-administered medications for this visit.     PHYSICAL EXAMINATION: ECOG PERFORMANCE STATUS: 1 - Symptomatic but completely ambulatory Vitals:   05/06/20 1459  BP: 128/84  Pulse: 78  Resp: 16  Temp:  97.6 F (36.4 C)   Filed Weights   05/06/20 1459  Weight: 161 lb 1.6 oz (73.1 kg)    Physical Exam  LABORATORY DATA:  I have reviewed the data as listed Lab Results  Component Value Date   WBC 4.5 04/09/2020   HGB 13.4 04/09/2020   HCT 40.1 04/09/2020   MCV 91.3 04/09/2020   PLT 224 04/09/2020   Recent Labs    09/12/19 1408 12/26/19 0803 04/09/20 1435 04/14/20 1652 05/01/20 1053  NA 139   < > 136 139 139  K 3.5   < > 3.0* 4.0 3.9  CL 102   < > 96* 103 102  CO2 29   < > 30 33* 31  GLUCOSE 105*   < > 92 84 85  BUN 22   < > 13 25* 21  CREATININE 0.77   < > 0.71 0.71 0.76  CALCIUM 9.4   < > 9.0 9.4 9.3  GFRNONAA >60  --  >60  --   --   GFRAA >60  --  >60  --   --   PROT 7.2   < > 7.0 6.5 6.2  ALBUMIN 4.1   < > 4.0 4.0 3.9  AST 83*   < > 52* 36 54*  ALT 93*   < > 163* 67* 123*  ALKPHOS 73   < > 167* 125* 151*  BILITOT 0.8   < > 0.9 0.5 0.6   < > = values in this interval not displayed.   Iron/TIBC/Ferritin/ %Sat    Component Value Date/Time   IRON 47 04/17/2020 1416   TIBC 344 04/17/2020 1416   FERRITIN 271 (H) 04/17/2020 1416   IRONPCTSAT 14 (L) 04/17/2020 1416      RADIOGRAPHIC STUDIES: I have personally reviewed the radiological images as listed and agreed with the findings in the report. CT ABDOMEN PELVIS W CONTRAST  Result Date: 04/09/2020 CLINICAL DATA:  74 year old female with epigastric pain and fever. EXAM: CT ABDOMEN AND PELVIS WITH CONTRAST TECHNIQUE: Multidetector CT imaging of the abdomen and pelvis was performed using the standard protocol following bolus administration of intravenous  contrast. CONTRAST:  17mL OMNIPAQUE IOHEXOL 300 MG/ML  SOLN COMPARISON:  Abdominal ultrasound dated 09/14/2019. FINDINGS: Lower chest: Bibasilar linear atelectasis/scarring. The visualized lung bases are otherwise clear. No intra-abdominal free air or free fluid. Hepatobiliary: The liver is unremarkable. Cholecystectomy. There is mild intrahepatic biliary ductal  dilatation. No retained calcified stone noted in the central CBD. Pancreas: Unremarkable. No pancreatic ductal dilatation or surrounding inflammatory changes. Spleen: Normal in size without focal abnormality. Adrenals/Urinary Tract: The adrenal glands are unremarkable. There is no hydronephrosis on either side. There is symmetric enhancement and excretion of contrast by both kidneys. Subcentimeter right renal hypodense lesions are too small to characterize. The visualized ureters and urinary bladder appear unremarkable. Stomach/Bowel: Small scattered sigmoid diverticula without active inflammatory changes. There is moderate stool throughout the colon. There is no bowel obstruction or active inflammation. Appendectomy. Vascular/Lymphatic: The abdominal aorta and IVC are unremarkable. No portal venous gas. There is no adenopathy. Reproductive: The uterus appears small which may be atrophic or related partial hysterectomy. Clinical correlation is recommended. Other: There is apparent laxity of the pelvic floor with probable slight prolapse of the vagina and uterus. Musculoskeletal: Grade 1 L3-L4 anterolisthesis. No acute osseous pathology. IMPRESSION: 1. No acute intra-abdominal or pelvic pathology. 2. Moderate colonic stool burden. No bowel obstruction. 3. Sigmoid diverticulosis. 4. Pelvic floor laxity with probable slight prolapse of the vagina and uterus. Clinical correlation is recommended. Electronically Signed   By: Anner Crete M.D.   On: 04/09/2020 14:24      ASSESSMENT & PLAN:  1. Abnormal iron saturation   2. Elevated ferritin    Labs reviewed and discussed with patient. Patient has elevated ferritin, decreased iron saturation. Discussed with her that ferritin can be falsely elevated due to other etiologies, including acute/chronic inflammation, drug effect, alcohol.  She has elevated ferritin, less than 500.  No urgent need for phlebotomy. Recommend to check hemochromatosis gene mutation  PCR. Slightly decreased iron saturation is most likely due to increased ferritin level.  She is not anemic. Advised patient not to take oral iron supplementation for now. Elevated liver enzymes, gastroenterology considered this may be secondary to drug effect.  Patient has been off simvastatin since yesterday.  Potential liver cell injury may also contribute to the abnormal iron testing. Recommend to repeat iron testing in 3 months. Patient appreciates the explanation and agrees with the plan.  Orders Placed This Encounter  Procedures  . CBC with Differential/Platelet    Standing Status:   Future    Number of Occurrences:   1    Standing Expiration Date:   05/06/2021  . Ferritin    Standing Status:   Future    Number of Occurrences:   1    Standing Expiration Date:   05/06/2021  . Iron and TIBC    Standing Status:   Future    Number of Occurrences:   1    Standing Expiration Date:   05/06/2021  . Hemochromatosis DNA-PCR([id number])    Standing Status:   Future    Number of Occurrences:   1    Standing Expiration Date:   05/06/2021  . Hepatic function panel    Standing Status:   Future    Number of Occurrences:   1    Standing Expiration Date:   05/06/2021    All questions were answered. The patient knows to call the clinic with any problems questions or concerns.  cc Virgel Manifold, MD    Return of visit: 3 months, check  CBC, iron, TIBC, ferritin, LFT. Thank you for this kind referral and the opportunity to participate in the care of this patient. A copy of today's note is routed to referring provider    Earlie Server, MD, PhD Hematology Oncology [facility] Pager- [phone] 05/06/2020

## 2020-05-06 NOTE — Progress Notes (Signed)
New evaluation for abnormal labs. 

## 2020-05-08 ENCOUNTER — Other Ambulatory Visit: Payer: Self-pay

## 2020-05-08 DIAGNOSIS — R1013 Epigastric pain: Secondary | ICD-10-CM

## 2020-05-08 MED ORDER — OMEPRAZOLE 20 MG PO CPDR: 20.0000 mg | DELAYED_RELEASE_CAPSULE | Freq: Two times a day (BID) | ORAL | 1 refills | Status: DC

## 2020-05-09 LAB — HEMOCHROMATOSIS DNA-PCR(C282Y,H63D)

## 2020-05-09 NOTE — Addendum Note (Signed)
Addended by: Earlie Server on: 05/09/2020 03:34 PM   Modules accepted: Orders

## 2020-05-13 DIAGNOSIS — R748 Abnormal levels of other serum enzymes: Secondary | ICD-10-CM | POA: Diagnosis not present

## 2020-05-13 DIAGNOSIS — H2513 Age-related nuclear cataract, bilateral: Secondary | ICD-10-CM | POA: Diagnosis not present

## 2020-05-14 LAB — HEPATIC FUNCTION PANEL
ALT: 31 IU/L (ref 0–32)
AST: 27 IU/L (ref 0–40)
Albumin: 4.2 g/dL (ref 3.7–4.7)
Alkaline Phosphatase: 122 IU/L — ABNORMAL HIGH (ref 48–121)
Bilirubin Total: 0.5 mg/dL (ref 0.0–1.2)
Bilirubin, Direct: 0.16 mg/dL (ref 0.00–0.40)
Total Protein: 6.3 g/dL (ref 6.0–8.5)

## 2020-05-14 LAB — CERULOPLASMIN: Ceruloplasmin: 26.4 mg/dL (ref 19.0–39.0)

## 2020-05-16 ENCOUNTER — Encounter: Payer: Self-pay | Admitting: Family

## 2020-06-04 ENCOUNTER — Other Ambulatory Visit: Payer: Self-pay

## 2020-06-04 ENCOUNTER — Ambulatory Visit: Payer: PPO | Admitting: Gastroenterology

## 2020-06-04 ENCOUNTER — Encounter: Payer: Self-pay | Admitting: Gastroenterology

## 2020-06-04 VITALS — BP 124/77 | HR 69 | Temp 98.2°F | Wt 164.2 lb

## 2020-06-04 DIAGNOSIS — R1084 Generalized abdominal pain: Secondary | ICD-10-CM | POA: Diagnosis not present

## 2020-06-04 DIAGNOSIS — R748 Abnormal levels of other serum enzymes: Secondary | ICD-10-CM | POA: Diagnosis not present

## 2020-06-04 NOTE — Patient Instructions (Signed)
Please take your Prilosec 20 MG twice a day until 06/08/2020. Then you will only take it once a day for the next 30 days and then stop.  Please have your Hepatic panel drawn in 2-3 weeks.

## 2020-06-05 NOTE — Progress Notes (Signed)
Vonda Antigua, MD [address]  [place]  [place], [place] [address]  Main: [phone]  Fax: [phone]   Primary Care Physician: Burnard Hawthorne, FNP   Chief Complaint  Patient presents with  . Follow-up  . Abdominal pain and elevated liver enzymes    Patient stated that she had an episode of abdominal pain on June 25 th.    HPI: ELISHEBA MCDONNELL is a 74 y.o. female here for follow-up of abdominal pain and elevated liver enzymes.  Patient states abdominal pain is much better.  Denies any dysphagia.  No weight loss.  Reports formed bowel movements daily without diarrhea or constipation.  No blood in stool.  Liver enzymes have improved since discontinuation of simvastatin.  Is taking meloxicam as needed, about once or twice a week, but no more than that  Current Outpatient Medications  Medication Sig Dispense Refill  . aspirin EC 81 MG tablet Take 81 mg by mouth daily.    . Cholecalciferol (VITAMIN D) 50 MCG (2000 UT) CAPS Take 2,000 Units by mouth daily.    . hydrochlorothiazide (HYDRODIURIL) 25 MG tablet TAKE 1/2 TABLET BY MOUTH DAILY 90 tablet 3  . omeprazole (PRILOSEC) 20 MG capsule Take 1 capsule (20 mg total) by mouth 2 (two) times daily before a meal. Take an hour before meal 60 capsule 1  . potassium chloride (KLOR-CON) 10 MEQ tablet TAKE 1 TABLET BY MOUTH TWICE DAILY FOR 3 DAYS THEN TAKE 1 TABLET BY MOUTH EVERY OTHER DAY THEREAFTER AS DIRECTED 45 tablet 0  . ibuprofen (ADVIL) 800 MG tablet Take 1 tablet (800 mg total) by mouth every 8 (eight) hours as needed. (Patient not taking: Reported on 06/04/2020) 30 tablet 0  . meloxicam (MOBIC) 15 MG tablet Take 15 mg by mouth daily. (Patient not taking: Reported on 06/04/2020)     No current facility-administered medications for this visit.    Allergies as of 06/04/2020 - Review Complete 06/04/2020  Allergen Reaction Noted  . Crestor [rosuvastatin]  05/16/2020    ROS:  General: Negative for anorexia, weight  loss, fever, chills, fatigue, weakness. ENT: Negative for hoarseness, difficulty swallowing , nasal congestion. CV: Negative for chest pain, angina, palpitations, dyspnea on exertion, peripheral edema.  Respiratory: Negative for dyspnea at rest, dyspnea on exertion, cough, sputum, wheezing.  GI: See history of present illness. GU:  Negative for dysuria, hematuria, urinary incontinence, urinary frequency, nocturnal urination.  Endo: Negative for unusual weight change.    Physical Examination:   BP 124/77   Pulse 69   Temp 98.2 F (36.8 C) (Oral)   Wt 164 lb 3.2 oz (74.5 kg)   BMI 30.03 kg/m   General: Well-nourished, well-developed in no acute distress.  Eyes: No icterus. Conjunctivae pink. Mouth: Oropharyngeal mucosa moist and pink , no lesions erythema or exudate. Neck: Supple, Trachea midline Abdomen: Bowel sounds are normal, nontender, nondistended, no hepatosplenomegaly or masses, no abdominal bruits or hernia , no rebound or guarding.   Extremities: No lower extremity edema. No clubbing or deformities. Neuro: Alert and oriented x 3.  Grossly intact. Skin: Warm and dry, no jaundice.   Psych: Alert and cooperative, normal mood and affect.   Labs: CMP     Component Value Date/Time   NA 139 05/01/2020 1053   NA 139 10/09/2015 0000   K 3.9 05/01/2020 1053   CL 102 05/01/2020 1053   CO2 31 05/01/2020 1053   GLUCOSE 85 05/01/2020 1053   BUN 21 05/01/2020 1053  BUN 18 10/09/2015 0000   CREATININE 0.76 05/01/2020 1053   CALCIUM 9.3 05/01/2020 1053   PROT 6.3 05/13/2020 1028   ALBUMIN 4.2 05/13/2020 1028   AST 27 05/13/2020 1028   ALT 31 05/13/2020 1028   ALKPHOS 122 (H) 05/13/2020 1028   BILITOT 0.5 05/13/2020 1028   GFRNONAA >60 04/09/2020 1435   GFRAA >60 04/09/2020 1435   Lab Results  Component Value Date   WBC 4.4 05/06/2020   HGB 13.0 05/06/2020   HCT 38.4 05/06/2020   MCV 92.5 05/06/2020   PLT 203 05/06/2020    Imaging Studies: No results  found.  Assessment and Plan:   JOANNE SALAH is a 74 y.o. y/o female here for follow-up of abdominal pain and elevated liver enzymes  Abdominal pain is much improved No alarm symptoms present Symptoms were possibly due to more frequent NSAID use Continue to minimize NSAIDs Decrease and discontinue PPI in the next 1 to 2 months, and this was discussed with her in detail.  If symptoms return, consider EGD at that time   Repeat liver enzymes in 2 to 3 weeks to ensure they have normalized    Dr Vonda Antigua

## 2020-06-24 ENCOUNTER — Other Ambulatory Visit: Payer: Self-pay | Admitting: Gastroenterology

## 2020-06-24 DIAGNOSIS — R1084 Generalized abdominal pain: Secondary | ICD-10-CM | POA: Diagnosis not present

## 2020-06-24 DIAGNOSIS — R748 Abnormal levels of other serum enzymes: Secondary | ICD-10-CM | POA: Diagnosis not present

## 2020-06-25 LAB — HEPATIC FUNCTION PANEL
ALT: 477 IU/L — ABNORMAL HIGH (ref 0–32)
AST: 429 IU/L — ABNORMAL HIGH (ref 0–40)
Albumin: 3.9 g/dL (ref 3.7–4.7)
Alkaline Phosphatase: 300 IU/L — ABNORMAL HIGH (ref 48–121)
Bilirubin Total: 1.3 mg/dL — ABNORMAL HIGH (ref 0.0–1.2)
Bilirubin, Direct: 0.66 mg/dL — ABNORMAL HIGH (ref 0.00–0.40)
Total Protein: 6.1 g/dL (ref 6.0–8.5)

## 2020-06-27 ENCOUNTER — Observation Stay
Admission: EM | Admit: 2020-06-27 | Discharge: 2020-06-27 | Disposition: A | Discharge status: Home / Self Care | Payer: PPO | Attending: Internal Medicine | Admitting: Internal Medicine

## 2020-06-27 ENCOUNTER — Telehealth: Payer: Self-pay | Admitting: Family

## 2020-06-27 ENCOUNTER — Emergency Department: Payer: PPO

## 2020-06-27 ENCOUNTER — Encounter: Payer: Self-pay | Admitting: Emergency Medicine

## 2020-06-27 ENCOUNTER — Other Ambulatory Visit: Payer: Self-pay

## 2020-06-27 DIAGNOSIS — I10 Essential (primary) hypertension: Secondary | ICD-10-CM | POA: Diagnosis present

## 2020-06-27 DIAGNOSIS — R7989 Other specified abnormal findings of blood chemistry: Secondary | ICD-10-CM

## 2020-06-27 DIAGNOSIS — I2510 Atherosclerotic heart disease of native coronary artery without angina pectoris: Secondary | ICD-10-CM | POA: Diagnosis not present

## 2020-06-27 DIAGNOSIS — R52 Pain, unspecified: Secondary | ICD-10-CM

## 2020-06-27 DIAGNOSIS — I119 Hypertensive heart disease without heart failure: Secondary | ICD-10-CM | POA: Insufficient documentation

## 2020-06-27 DIAGNOSIS — Z79899 Other long term (current) drug therapy: Secondary | ICD-10-CM | POA: Insufficient documentation

## 2020-06-27 DIAGNOSIS — Z87891 Personal history of nicotine dependence: Secondary | ICD-10-CM | POA: Insufficient documentation

## 2020-06-27 DIAGNOSIS — K831 Obstruction of bile duct: Principal | ICD-10-CM | POA: Insufficient documentation

## 2020-06-27 DIAGNOSIS — R109 Unspecified abdominal pain: Secondary | ICD-10-CM | POA: Diagnosis present

## 2020-06-27 DIAGNOSIS — R7401 Elevation of levels of liver transaminase levels: Secondary | ICD-10-CM | POA: Diagnosis not present

## 2020-06-27 DIAGNOSIS — R1011 Right upper quadrant pain: Secondary | ICD-10-CM | POA: Diagnosis present

## 2020-06-27 DIAGNOSIS — R945 Abnormal results of liver function studies: Secondary | ICD-10-CM | POA: Diagnosis present

## 2020-06-27 DIAGNOSIS — Z7982 Long term (current) use of aspirin: Secondary | ICD-10-CM | POA: Insufficient documentation

## 2020-06-27 DIAGNOSIS — N281 Cyst of kidney, acquired: Secondary | ICD-10-CM | POA: Diagnosis not present

## 2020-06-27 DIAGNOSIS — Z85038 Personal history of other malignant neoplasm of large intestine: Secondary | ICD-10-CM | POA: Diagnosis not present

## 2020-06-27 HISTORY — DX: Other specified abnormal findings of blood chemistry: R79.89

## 2020-06-27 LAB — CBC WITH DIFFERENTIAL/PLATELET
Abs Immature Granulocytes: 0.01 10*3/uL (ref 0.00–0.07)
Basophils Absolute: 0 10*3/uL (ref 0.0–0.1)
Basophils Relative: 0 %
Eosinophils Absolute: 0.1 10*3/uL (ref 0.0–0.5)
Eosinophils Relative: 1 %
HCT: 40.3 % (ref 36.0–46.0)
Hemoglobin: 13.8 g/dL (ref 12.0–15.0)
Immature Granulocytes: 0 %
Lymphocytes Relative: 13 %
Lymphs Abs: 0.7 10*3/uL (ref 0.7–4.0)
MCH: 31.4 pg (ref 26.0–34.0)
MCHC: 34.2 g/dL (ref 30.0–36.0)
MCV: 91.8 fL (ref 80.0–100.0)
Monocytes Absolute: 0.3 10*3/uL (ref 0.1–1.0)
Monocytes Relative: 6 %
Neutro Abs: 4.2 10*3/uL (ref 1.7–7.7)
Neutrophils Relative %: 80 %
Platelets: 187 10*3/uL (ref 150–400)
RBC: 4.39 MIL/uL (ref 3.87–5.11)
RDW: 13.3 % (ref 11.5–15.5)
WBC: 5.3 10*3/uL (ref 4.0–10.5)
nRBC: 0 % (ref 0.0–0.2)

## 2020-06-27 LAB — COMPREHENSIVE METABOLIC PANEL
ALT: 459 U/L — ABNORMAL HIGH (ref 0–44)
AST: 476 U/L — ABNORMAL HIGH (ref 15–41)
Albumin: 4 g/dL (ref 3.5–5.0)
Alkaline Phosphatase: 284 U/L — ABNORMAL HIGH (ref 38–126)
Anion gap: 9 (ref 5–15)
BUN: 17 mg/dL (ref 8–23)
CO2: 28 mmol/L (ref 22–32)
Calcium: 8.9 mg/dL (ref 8.9–10.3)
Chloride: 103 mmol/L (ref 98–111)
Creatinine, Ser: 0.74 mg/dL (ref 0.44–1.00)
GFR calc Af Amer: >60 mL/min (ref >60)
GFR calc non Af Amer: >60 mL/min (ref >60)
Glucose, Bld: 125 mg/dL — ABNORMAL HIGH (ref 70–99)
Potassium: 3.9 mmol/L (ref 3.5–5.1)
Sodium: 140 mmol/L (ref 135–145)
Total Bilirubin: 2.8 mg/dL — ABNORMAL HIGH (ref 0.3–1.2)
Total Protein: 6.9 g/dL (ref 6.5–8.1)

## 2020-06-27 LAB — LIPASE, BLOOD: Lipase: 71 U/L — ABNORMAL HIGH (ref 11–51)

## 2020-06-27 LAB — URINALYSIS, COMPLETE (UACMP) WITH MICROSCOPIC
Bacteria, UA: NONE SEEN
Bilirubin Urine: NEGATIVE
Glucose, UA: NEGATIVE mg/dL
Ketones, ur: NEGATIVE mg/dL
Leukocytes,Ua: NEGATIVE
Nitrite: NEGATIVE
Protein, ur: NEGATIVE mg/dL
Specific Gravity, Urine: 1.017 (ref 1.005–1.030)
pH: 6 (ref 5.0–8.0)

## 2020-06-27 LAB — TROPONIN I (HIGH SENSITIVITY): Troponin I (High Sensitivity): 8 ng/L (ref <18)

## 2020-06-27 MED ORDER — PROMETHAZINE HCL 25 MG PO TABS: 25.0000 mg | ORAL_TABLET | Freq: Three times a day (TID) | ORAL | 0 refills | Status: DC | PRN

## 2020-06-27 MED ORDER — MORPHINE SULFATE (PF) 2 MG/ML IV SOLN: 1.0000 mg | INTRAVENOUS | Status: DC | PRN

## 2020-06-27 MED ORDER — ONDANSETRON HCL 4 MG/2ML IJ SOLN: 4.0000 mg | Freq: Once | INTRAMUSCULAR | Status: DC

## 2020-06-27 MED ORDER — SODIUM CHLORIDE 0.9 % IV SOLN: Freq: Once | INTRAVENOUS | Status: DC

## 2020-06-27 MED ORDER — GADOBUTROL 1 MMOL/ML IV SOLN
7.0000 mL | Freq: Once | INTRAVENOUS | Status: AC | PRN
  Administered 2020-06-27: 7 mL via INTRAVENOUS

## 2020-06-27 MED ORDER — MORPHINE SULFATE (PF) 4 MG/ML IV SOLN: 4.0000 mg | INTRAVENOUS | Status: DC | PRN

## 2020-06-27 MED ORDER — ONDANSETRON HCL 4 MG/2ML IJ SOLN: 4.0000 mg | Freq: Three times a day (TID) | INTRAMUSCULAR | Status: DC | PRN

## 2020-06-27 MED ORDER — SODIUM CHLORIDE 0.9 % IV BOLUS
1000.0000 mL | Freq: Once | INTRAVENOUS | Status: AC
  Administered 2020-06-27: 1000 mL via INTRAVENOUS

## 2020-06-27 MED ORDER — ONDANSETRON 4 MG PO TBDP
4.0000 mg | ORAL_TABLET | Freq: Once | ORAL | Status: AC
  Administered 2020-06-27: 4 mg via ORAL
  Filled 2020-06-27: qty 1

## 2020-06-27 NOTE — ED Notes (Signed)
Hourly rounding reveals patient in room laying in bed, eyes open husband at bedside. Patient asked for lights to be dimmed and husband asked to watch television, remote provided. No complaints, stable, in no acute distress.

## 2020-06-27 NOTE — ED Provider Notes (Signed)
[facility] Emergency Department Provider Note  ____________________________________________   First MD Initiated Contact with Patient 06/27/20 0801     (approximate)  I have reviewed the triage vital signs and the nursing notes.   HISTORY  Chief Complaint Abdominal Pain    HPI Valerie Curry is a 74 y.o. female  With PMHx gallstones here with recurrent abd pain. Pt reports her pain began last night as fairly acute onset of severe, aching, gnawing epigastric pain with some RUQ pain as well. She had associated nausea and NBNB emesis. Pain has been a recurrent issue, and has persisted despite undergoing cholecystectomy. She follows up with Dr. Enriqueta Shutter for this. Denies any known CBD stones. No fever today but did have temp 100.104F during attack 3-4 days ago. No specific alleviating factors. Pain seems to recur and worsen with eating.        Past Medical History:  Diagnosis Date  . Arthritis   . Complication of anesthesia    hard to wake up  . Coronary artery disease 11/2018   calcification in LAD and RCA in screening lung scan   . Family history of malignant neoplasm of breast    Maternal/paternal aunts and cousin  . Hypertension   . Lump or mass in breast 2011/2012   benign  . Obesity, unspecified   . Osteopenia    took Boniva 1 1/2 years in past-doesn't want to restart it  . Personal history of tobacco use, presenting hazards to health   . PONV (postoperative nausea and vomiting)   . Positive PPD    as a child. xray was normal.  . Systemic sclerosis [facility])     Patient Active Problem List   Diagnosis Date Noted  . Abdominal pain 06/27/2020  . Abnormal LFTs 06/27/2020  . Bursitis of hip 04/17/2020  . Epigastric pain 12/28/2019  . Pain in thumb joint with movement, right 11/13/2019  . Osteoarthritis of knee 11/13/2019  . Symptomatic cholelithiasis   . Umbilical hernia without obstruction and without gangrene   . Right bundle branch block  (RBBB) with incomplete left bundle branch block (LBBB) 09/28/2019  . Calculus of gallbladder without cholecystitis without obstruction 09/20/2019  . Elevated liver enzymes 09/20/2019  . Chronic diarrhea   . Benign neoplasm of descending colon   . Atherosclerosis of aorta ([place]) 12/13/2018  . Coronary artery disease 11/2018  . HLD (hyperlipidemia) 11/17/2018  . Uterine prolapse 11/17/2018  . Diarrhea 11/17/2018  . Family history of clotting disorder 11/15/2017  . Encounter for screening for malignant neoplasm of respiratory organs 10/06/2015  . Postmenopausal estrogen deficiency 10/06/2015  . Spinal stenosis, lumbar region, with neurogenic claudication 04/30/2015  . Routine physical examination 03/25/2015  . Low back pain 03/25/2015  . Screening for breast cancer 09/13/2014  . Obesity (BMI 30-39.9) 09/13/2014  . HTN (hypertension) 08/31/2013  . Osteoporosis 06/27/2007    Past Surgical History:  Procedure Laterality Date  . APPENDECTOMY  1961  . BREAST BIOPSY Right Nov 2011   benign  . BREAST BIOPSY Left Nov 2012   dilated ducts-benign  . BUNIONECTOMY Bilateral 2010   x 2  . CHOLECYSTECTOMY N/A 10/03/2019   Procedure: LAPAROSCOPIC CHOLECYSTECTOMY;  Surgeon: Fredirick Maudlin, MD;  Location: [facility] ORS;  Service: General;  Laterality: N/A;  . COLONOSCOPY  8/05   Dr. Vira Agar  . COLONOSCOPY WITH PROPOFOL N/A 01/10/2019   Procedure: COLONOSCOPY WITH PROPOFOL;  Surgeon: Virgel Manifold, MD;  Location: [facility] ENDOSCOPY;  Service: Endoscopy;  Laterality: N/A;  .  DECOMPRESSIVE LUMBAR LAMINECTOMY LEVEL 2 N/A 04/30/2015   Procedure: CENTRAL DECOMPRESSIVE LUMBER LAMINECTOMY WITH POSSIBLE FORAMINOTOMIES L3-L4,L4-L5;  Surgeon: Latanya Maudlin, MD;  Location: [facility] ORS;  Service: Orthopedics;  Laterality: N/A;  . TONSILLECTOMY  1968  . TUBAL LIGATION  1974    Prior to Admission medications   Medication Sig Start Date End Date Taking? Authorizing Provider  aspirin EC 81 MG tablet Take 81 mg by  mouth daily.    [provider]  Cholecalciferol (VITAMIN D) 50 MCG (2000 UT) CAPS Take 2,000 Units by mouth daily.    [provider]  hydrochlorothiazide (HYDRODIURIL) 25 MG tablet TAKE 1/2 TABLET BY MOUTH DAILY 04/14/20   Burnard Hawthorne, FNP  ibuprofen (ADVIL) 800 MG tablet Take 1 tablet (800 mg total) by mouth every 8 (eight) hours as needed. Patient not taking: Reported on 06/04/2020 10/03/19   Fredirick Maudlin, MD  meloxicam (MOBIC) 15 MG tablet Take 15 mg by mouth daily. Patient not taking: Reported on 06/04/2020 06/02/20   [provider]  omeprazole (PRILOSEC) 20 MG capsule Take 1 capsule (20 mg total) by mouth 2 (two) times daily before a meal. Take an hour before meal 05/08/20   Vonda Antigua B, MD  potassium chloride (KLOR-CON) 10 MEQ tablet TAKE 1 TABLET BY MOUTH TWICE DAILY FOR 3 DAYS THEN TAKE 1 TABLET BY MOUTH EVERY OTHER DAY THEREAFTER AS DIRECTED 04/09/20   Burnard Hawthorne, FNP  promethazine (PHENERGAN) 25 MG tablet Take 1 tablet (25 mg total) by mouth every 8 (eight) hours as needed for nausea or vomiting. 06/27/20   Duffy Bruce, MD    Allergies Crestor [rosuvastatin]  Family History  Problem Relation Age of Onset  . Lung cancer Father   . Heart disease Father 47       Cardiac Arrest  . Heart disease Mother   . Hypertension Mother   . Stroke Mother   . Other Brother        blood clots  . Other Other        hypercoag-family history  . Cancer Maternal Aunt        Breast cancer  . Breast cancer Maternal Aunt 67  . Breast cancer Paternal Aunt 82  . Breast cancer Cousin     Social History Social History   Tobacco Use  . Smoking status: Former Smoker    Packs/day: 1.00    Years: 50.00    Pack years: 50.00    Types: Cigarettes    Quit date: 11/29/2014    Years since quitting: 5.5  . Smokeless tobacco: Former Systems developer    Quit date: 04/30/2015  Vaping Use  . Vaping Use: Never used  Substance Use Topics  . Alcohol use: Yes     Alcohol/week: 0.0 standard drinks    Comment: occasional glassof wine   . Drug use: No    Review of Systems  Review of Systems  Constitutional: Positive for fatigue and fever. Negative for chills.  HENT: Negative for sore throat.   Respiratory: Negative for shortness of breath.   Cardiovascular: Negative for chest pain.  Gastrointestinal: Positive for abdominal pain, nausea and vomiting.  Genitourinary: Negative for flank pain.  Musculoskeletal: Negative for neck pain.  Skin: Negative for rash and wound.  Allergic/Immunologic: Negative for immunocompromised state.  Neurological: Negative for weakness and numbness.  Hematological: Does not bruise/bleed easily.  All other systems reviewed and are negative.    ____________________________________________  PHYSICAL EXAM:      VITAL SIGNS: ED  Triage Vitals  Enc Vitals Group     BP 06/27/20 0304 (!) 136/88     Pulse Rate 06/27/20 0304 89     Resp 06/27/20 0304 20     Temp 06/27/20 0304 98.2 F (36.8 C)     Temp Source 06/27/20 0304 Oral     SpO2 06/27/20 0304 100 %     Weight 06/27/20 0258 160 lb (72.6 kg)     Height 06/27/20 0258 5\' 2"  (1.575 m)     Head Circumference --      Peak Flow --      Pain Score 06/27/20 0304 9     Pain Loc --      Pain Edu? --      Excl. in [place]? --      Physical Exam Vitals and nursing note reviewed.  Constitutional:      General: She is not in acute distress.    Appearance: She is well-developed.  HENT:     Head: Normocephalic and atraumatic.  Eyes:     Conjunctiva/sclera: Conjunctivae normal.  Cardiovascular:     Rate and Rhythm: Normal rate and regular rhythm.     Heart sounds: Normal heart sounds. No murmur heard.  No friction rub.  Pulmonary:     Effort: Pulmonary effort is normal. No respiratory distress.     Breath sounds: Normal breath sounds. No wheezing or rales.  Abdominal:     General: There is no distension.     Palpations: Abdomen is soft.     Tenderness: There is  abdominal tenderness in the right upper quadrant and epigastric area. There is no right CVA tenderness, left CVA tenderness, guarding or rebound. Negative signs include Murphy's sign.  Musculoskeletal:     Cervical back: Neck supple.  Skin:    General: Skin is warm.     Capillary Refill: Capillary refill takes less than 2 seconds.  Neurological:     Mental Status: She is alert and oriented to person, place, and time.     Motor: No abnormal muscle tone.       ____________________________________________   LABS (all labs ordered are listed, but only abnormal results are displayed)  Labs Reviewed  COMPREHENSIVE METABOLIC PANEL - Abnormal; Notable for the following components:      Result Value   Glucose, Bld 125 (*)    AST 476 (*)    ALT 459 (*)    Alkaline Phosphatase 284 (*)    Total Bilirubin 2.8 (*)    All other components within normal limits  LIPASE, BLOOD - Abnormal; Notable for the following components:   Lipase 71 (*)    All other components within normal limits  URINALYSIS, COMPLETE (UACMP) WITH MICROSCOPIC - Abnormal; Notable for the following components:   Color, Urine AMBER (*)    APPearance CLEAR (*)    Hgb urine dipstick SMALL (*)    All other components within normal limits  SARS CORONAVIRUS 2 BY RT PCR (HOSPITAL ORDER, [facility])  CBC WITH DIFFERENTIAL/PLATELET  TROPONIN I (HIGH SENSITIVITY)    ____________________________________________  EKG: Normal sinus rhythm, VR 78. PR 162, QRS 144, QTc 483. No acute St elevations. LBBB, no Sgarbossa criteria. ________________________________________  RADIOLOGY All imaging, including plain films, CT scans, and ultrasounds, independently reviewed by me, and interpretations confirmed via formal radiology reads.  ED MD interpretation:   [place]: Normal RUQ U/S s/p cholecystectomy, CBD 7 mm  Official radiology report(s): MR 3D Recon At Scanner  Result Date: 06/27/2020 CLINICAL DATA:   Recurrent right upper quadrant and epigastric pain. Prior cholecystectomy. EXAM: MRI ABDOMEN WITHOUT AND WITH CONTRAST (INCLUDING MRCP) TECHNIQUE: Multiplanar multisequence MR imaging of the abdomen was performed both before and after the administration of intravenous contrast. Heavily T2-weighted images of the biliary and pancreatic ducts were obtained, and three-dimensional MRCP images were rendered by post processing. CONTRAST:  54mL GADAVIST GADOBUTROL 1 MMOL/ML IV SOLN COMPARISON:  CT on 04/09/2020 FINDINGS: Lower chest: No acute findings. Hepatobiliary: No hepatic masses identified. Prior cholecystectomy. No evidence of biliary ductal dilatation, with common bile duct measuring 7 mm. No evidence of choledocholithiasis. Pancreas: No mass or inflammatory changes. No evidence of pancreatic ductal dilatation or pancreas divisum. Spleen:  Within normal limits in size and appearance. Adrenals/Urinary Tract: No masses identified. A few tiny sub-cm right renal cysts are noted. No evidence of hydronephrosis. Stomach/Bowel: Visualized portion unremarkable. Vascular/Lymphatic: No pathologically enlarged lymph nodes identified. No abdominal aortic aneurysm. Other:  None. Musculoskeletal:  No suspicious bone lesions identified. IMPRESSION: Prior cholecystectomy. No evidence of biliary ductal dilatation, choledocholithiasis, or other significant abnormality. Electronically Signed   By: Marlaine Hind M.D.   On: 06/27/2020 10:06   MR ABDOMEN MRCP W WO CONTAST  Result Date: 06/27/2020 CLINICAL DATA:  Recurrent right upper quadrant and epigastric pain. Prior cholecystectomy. EXAM: MRI ABDOMEN WITHOUT AND WITH CONTRAST (INCLUDING MRCP) TECHNIQUE: Multiplanar multisequence MR imaging of the abdomen was performed both before and after the administration of intravenous contrast. Heavily T2-weighted images of the biliary and pancreatic ducts were obtained, and three-dimensional MRCP images were rendered by post processing.  CONTRAST:  66mL GADAVIST GADOBUTROL 1 MMOL/ML IV SOLN COMPARISON:  CT on 04/09/2020 FINDINGS: Lower chest: No acute findings. Hepatobiliary: No hepatic masses identified. Prior cholecystectomy. No evidence of biliary ductal dilatation, with common bile duct measuring 7 mm. No evidence of choledocholithiasis. Pancreas: No mass or inflammatory changes. No evidence of pancreatic ductal dilatation or pancreas divisum. Spleen:  Within normal limits in size and appearance. Adrenals/Urinary Tract: No masses identified. A few tiny sub-cm right renal cysts are noted. No evidence of hydronephrosis. Stomach/Bowel: Visualized portion unremarkable. Vascular/Lymphatic: No pathologically enlarged lymph nodes identified. No abdominal aortic aneurysm. Other:  None. Musculoskeletal:  No suspicious bone lesions identified. IMPRESSION: Prior cholecystectomy. No evidence of biliary ductal dilatation, choledocholithiasis, or other significant abnormality. Electronically Signed   By: Marlaine Hind M.D.   On: 06/27/2020 10:06   US Abdomen Limited RUQ  Result Date: 06/27/2020 CLINICAL DATA:  Initial evaluation for acute abdominal pain, nausea. History of prior cholecystectomy. EXAM: ULTRASOUND ABDOMEN LIMITED RIGHT UPPER QUADRANT COMPARISON:  Prior CT from 04/09/2020. FINDINGS: Gallbladder: Prior cholecystectomy. Common bile duct: Diameter: Up to 7 mm, within normal limits for age. Liver: No focal lesion identified. Within normal limits in parenchymal echogenicity. Portal vein is patent on color Doppler imaging with normal direction of blood flow towards the liver. Other: None. IMPRESSION: 1. Prior cholecystectomy. No biliary dilatation or other acute abnormality. 2. Normal sonographic appearance of the liver. Electronically Signed   By: Jeannine Boga M.D.   On: 06/27/2020 04:53    ____________________________________________  PROCEDURES   Procedure(s) performed (including Critical Care):  .1-3 Lead EKG  Interpretation Performed by: Duffy Bruce, MD Authorized by: Duffy Bruce, MD     Interpretation: normal     ECG rate:  60-70s   ECG rate assessment: normal     Rhythm: sinus rhythm     Ectopy: none     Conduction: normal  Comments:     Indication: Abd pain    ____________________________________________  INITIAL IMPRESSION / MDM / ASSESSMENT AND PLAN / ED COURSE  As part of my medical decision making, I reviewed the following data within the [place] notes reviewed and incorporated, Old chart reviewed, Notes from prior ED visits, and [place] Controlled Substance Madelia was evaluated in Emergency Department on 06/27/2020 for the symptoms described in the history of present illness. She was evaluated in the context of the global COVID-19 pandemic, which necessitated consideration that the patient might be at risk for infection with the SARS-CoV-2 virus that causes COVID-19. Institutional protocols and algorithms that pertain to the evaluation of patients at risk for COVID-19 are in a state of rapid change based on information released by regulatory bodies including the CDC and federal and state organizations. These policies and algorithms were followed during the patient's care in the ED.  Some ED evaluations and interventions may be delayed as a result of limited staffing during the pandemic.*     Medical Decision Making:  74 yo F here with abdominal pain, nausea, now resolved. Labs show significant transaminitis with elevated AlkP and Bilirubin. Pt has known recurrent pain, and is sp cholecystectomy. MRCP subsequently obtained due to concern for CBD stone, cholangitis, and is negative for any acute finding. Discussed with Dr. Alice Reichert as well as pt's primary Gi physician Dr. Bonna Gains. Pt will likely need ERCP but at this time, Dr. Allen Norris is out of town and I called [facility], [facility], and [facility], all of which have >24 hr waiting lists. Discussed  options with pt including admission for sx control and trending labs, transfer to [facility] once available, and home with symptomatic control. Following this discussion and based on shared decision making, decision made to return home given that she has remained symptom free x hours in the ED, is tolerating PO, and is in no distress. I discussed that she will need to call the office in the morning on Monday to set up a follow-up appt with Dr. Bonna Gains and/or Dr. Allen Norris, and that this would need to be done before an ERCP would be able to be arranged (and may ultimately not need to be done, depending on specialist eval). Pt in agreement and will d/c home.   ____________________________________________  FINAL CLINICAL IMPRESSION(S) / ED DIAGNOSES  Final diagnoses:  Transaminitis  Common bile duct (CBD) obstruction     MEDICATIONS GIVEN DURING THIS VISIT:  Medications  ondansetron (ZOFRAN) injection 4 mg (4 mg Intravenous Refused 06/27/20 1237)  ondansetron (ZOFRAN) injection 4 mg (has no administration in time range)  morphine 2 MG/ML injection 1 mg (has no administration in time range)  ondansetron (ZOFRAN-ODT) disintegrating tablet 4 mg (4 mg Oral Given 06/27/20 0307)  sodium chloride 0.9 % bolus 1,000 mL (0 mLs Intravenous Stopped 06/27/20 1235)  gadobutrol (GADAVIST) 1 MMOL/ML injection 7 mL (7 mLs Intravenous Contrast Given 06/27/20 0953)     ED Discharge Orders         Ordered    promethazine (PHENERGAN) 25 MG tablet  Every 8 hours PRN     Discontinue  Reprint     06/27/20 1241           Note:  This document was prepared using Dragon voice recognition software and may include unintentional dictation errors.   Duffy Bruce, MD 06/27/20 [phone]

## 2020-06-27 NOTE — Telephone Encounter (Signed)
lft msg for pt to call ofc to sch US renal on 07/30 ry

## 2020-06-27 NOTE — ED Triage Notes (Signed)
Patient ambulatory to triage with steady gait, without difficulty or distress noted; pt reports mid upper abd pain since yesterday am, nonradiating accomp by nausea; st hx of same with dx gastritis

## 2020-06-27 NOTE — ED Notes (Signed)
Pt reports intermittent episodes of epigastric abdominal pain. Pt reports that she has been seen and worked up by GI, they have not been able to find cause of pain. Pt reports that she had her Gallbladder taken out due to symptoms but that symptoms have not gotten better. Pt reports that on Monday she had an attack and had low grade fever with it. Current attack started last night. Pt had 1 episode of vomiting but denies fever. Pt has had chills.

## 2020-06-27 NOTE — ED Notes (Signed)
MD Isaacs at bedside discussing admission with patient. Pt at this time wishes to decline admission at this time. Pt verbalized understanding of risks of leaving at this time. Pt verbalized understanding to follow up with GI doctor first thing Monday morning. Pt in NAD at this time, respirations even and unlabored.

## 2020-06-27 NOTE — Care Management (Signed)
Admission is canceled by EDP.  Ivor Costa, MD  [facility] Pager [phone]  If 7PM-7AM, please contact night-coverage [email or web address] Password [facility] 06/27/2020, 12:57 PM

## 2020-06-30 ENCOUNTER — Other Ambulatory Visit: Payer: Self-pay

## 2020-06-30 DIAGNOSIS — K831 Obstruction of bile duct: Secondary | ICD-10-CM

## 2020-07-01 ENCOUNTER — Ambulatory Visit: Payer: PPO | Admitting: Gastroenterology

## 2020-07-01 ENCOUNTER — Encounter: Payer: Self-pay | Admitting: Gastroenterology

## 2020-07-01 ENCOUNTER — Other Ambulatory Visit: Payer: Self-pay

## 2020-07-01 VITALS — BP 136/84 | HR 77 | Temp 98.1°F | Wt 160.0 lb

## 2020-07-01 DIAGNOSIS — R748 Abnormal levels of other serum enzymes: Secondary | ICD-10-CM

## 2020-07-01 NOTE — Patient Instructions (Addendum)
[email or web address]  " Sphincter of Oddi Dysfunction The sphincter of Oddi is a muscular valve that controls the flow of digestive juices (bile and pancreatic juice) through ducts from the liver and pancreas into the first part of the small intestine (duodenum). Sphincter of Oddi dysfunction (SOD) describes the situation when the sphincter does not relax at the appropriate time (due to scarring or spasm). The back-up of juices causes episodes of severe abdominal pain. Doctor-Patient Communication Doctors often consider SOD in patients who experience recurrent attacks of pain after surgical removal of the gallbladder (cholecystectomy). More than half a million of these surgeries are performed annually in the [place], and 10-20% of these patients present afterwards with continuing or recurrent pains. SOD is also considered in some patients who suffer from recurrent attacks of unexplained inflammation of the pancreas (pancreatitis).  About half of these patients will have findings on laboratory studies or imaging (blood test, ultrasound, CT scan, or MRCP) to suggest a definite abnormality, such as a stone in the bile duct. MRCP (magnetic resonance cholangiopancreatography) is nowadays a good non-invasive test for checking on the biliary and pancreatic drainage systems.  Based on patients histories, physical examinations, and other clinical data, doctors can categorize these patients as having SOD Types I and II. The categories help guide treatment of the disease. They are based on a system called the [place] criteria.  Treatment When symptoms are severe, standard treatment is to perform an endoscopic procedure called ERCP (endoscopic retrograde cholangiopancreatography). ERCP is a procedure for the examination or treatment of the bile duct and pancreatic duct. The procedure carries a risk of serious complications and is done under sedation by experts  trained in the technique. It combines the use of x-rays and an endoscope that is passed down to the duodenum, where the bile duct and pancreatic ducts drain, and a dye that is injected into the ducts.  An additional procedure, sphincter of Oddi manometry [facility]), involves passing a catheter into the bile and/or pancreatic duct during ERCP to measure the pressure of the biliary and/or pancreatic sphincter. It is considered the gold standard diagnostic modality for SOD.  Treatment depends on what is found. It may often involve cutting the muscular sphincter (sphincterotomy) to remove any stones or to relieve any scarring or spasm of the sphincter.  As noted above, a very important problem in this context is that these ERCP procedures carry a significant risk of complications. In particular, ERCP (with or without sphincter of Oddi manometry) can cause an attack of pancreatitis in 5-10% of cases. While most of these result in a few days in the hospital, about 1% of patients suffer a major attack, with weeks or months in the hospital. Sphincterotomy also carries a small risk of other severe complications such as bleeding and perforation, and the possibility of delayed narrowing of a duct (stenosis) due to scarring.  Functional SOD Patients with a similar pain problem, but who have little or no abnormalities on blood tests and standard scans (including MRCP), are categorized as having SOD Type III. The episodes of pain are assumed due to intermittent spasm of the sphincter. It is very difficult to effectively evaluate and manage patients with Type III SOD. Some physicians are skeptical of its existence, or assume that it is a part of a broader problem of a functional digestive disturbance such as irritable bowel syndrome.  Because of the risks of ERCP, patients with suspected SOD III are usually advised to try medical treatments first. Some respond  to the use of antispasmodic drugs and/or antidepressants that may  help decrease pain.  There have been studies of other medical therapies, such as calcium channel blocking drugs. Despite a few encouraging reports, these methods have not proven to be effective generally, and are not widely used.  Patients who fail these approaches (at least those with severe symptoms) are usually advised to see specialists at referral centers. Further evaluation may involve additional or more specialized tests to help guide treatment options.  Clinical Research Study The uncertainties in how best to diagnose and to treat "suspected" sphincter of Oddi dysfunction (and the risks involved) mandate further scientific investigation. The [employer] has recently funded an important study called "EPISOD" in 6 major Gastroenterology centers in [place].  The studies are being conducted at centers located in:  [place], [place] [facility] [place], [place], [place] [facility] [place], [place], [place], [place], [facility], [place], [place]"   [facility], [place], [place]

## 2020-07-02 ENCOUNTER — Encounter: Payer: Self-pay | Admitting: Family

## 2020-07-02 LAB — COMPREHENSIVE METABOLIC PANEL
ALT: 139 IU/L — ABNORMAL HIGH (ref 0–32)
AST: 45 IU/L — ABNORMAL HIGH (ref 0–40)
Albumin/Globulin Ratio: 2 (ref 1.2–2.2)
Albumin: 4.1 g/dL (ref 3.7–4.7)
Alkaline Phosphatase: 259 IU/L — ABNORMAL HIGH (ref 48–121)
BUN/Creatinine Ratio: 27 (ref 12–28)
BUN: 20 mg/dL (ref 8–27)
Bilirubin Total: 0.6 mg/dL (ref 0.0–1.2)
CO2: 27 mmol/L (ref 20–29)
Calcium: 9.8 mg/dL (ref 8.7–10.3)
Chloride: 102 mmol/L (ref 96–106)
Creatinine, Ser: 0.74 mg/dL (ref 0.57–1.00)
GFR calc Af Amer: 93 mL/min/{1.73_m2} (ref >59)
GFR calc non Af Amer: 81 mL/min/{1.73_m2} (ref >59)
Globulin, Total: 2.1 g/dL (ref 1.5–4.5)
Glucose: 82 mg/dL (ref 65–99)
Potassium: 3.9 mmol/L (ref 3.5–5.2)
Sodium: 140 mmol/L (ref 134–144)
Total Protein: 6.2 g/dL (ref 6.0–8.5)

## 2020-07-02 LAB — SPECIMEN STATUS REPORT

## 2020-07-02 LAB — GAMMA GT: GGT: 184 IU/L — ABNORMAL HIGH (ref 0–60)

## 2020-07-02 NOTE — Progress Notes (Signed)
Vonda Antigua, MD [address]  [name]  [place], [place] [address]  Main: [phone]  Fax: [phone]   Primary Care Physician: Burnard Hawthorne, FNP  CC: Abdominal pain  HPI: Valerie Curry is a 74 y.o. female here for follow-up of abdominal pain.  Midepigastric region.  Not associated with meals.  Brings a diary with documented episodes of pain that occurred randomly.  Patient has previously been on PPI which did not help her pain so she discontinued the medication.  She is not taking any of her prescribed medications, and no NSAIDs at this time.  No dysphagia.  Patient went to the ER recently due to the abdominal pain and was noted to have elevated liver enzymes with bilirubin of 2.8, alk phos 284, AST 476, ALT 459.  Based on the elevated bilirubin she was told that she will need an ERCP but since her pain improved during the ER visit she was discharged with outpatient follow-up.  MRCP was done during the ER visit and did not show any choledocholithiasis, CBD dilation or obstruction.  Patient has had previous history of cholecystectomy due to this abdominal pain and this did not lead to improvement in her symptoms.  Patient is pain-free outside of her episodic pain symptoms.  She has previously had mild elevation in liver enzymes that was partially attributed to simvastatin and simvastatin was discontinued and liver enzymes improved subsequently after that, but acutely worsened in July 2021 when she went to the ER  Current Outpatient Medications  Medication Sig Dispense Refill   aspirin EC 81 MG tablet Take 81 mg by mouth daily.     Cholecalciferol (VITAMIN D) 50 MCG (2000 UT) CAPS Take 2,000 Units by mouth daily.     hydrochlorothiazide (HYDRODIURIL) 25 MG tablet TAKE 1/2 TABLET BY MOUTH DAILY 90 tablet 3   ibuprofen (ADVIL) 800 MG tablet Take 1 tablet (800 mg total) by mouth every 8 (eight) hours as needed. 30 tablet 0   meloxicam (MOBIC) 15 MG tablet Take  15 mg by mouth as needed.      omeprazole (PRILOSEC) 20 MG capsule Take 1 capsule (20 mg total) by mouth 2 (two) times daily before a meal. Take an hour before meal 60 capsule 1   potassium chloride (KLOR-CON) 10 MEQ tablet TAKE 1 TABLET BY MOUTH TWICE DAILY FOR 3 DAYS THEN TAKE 1 TABLET BY MOUTH EVERY OTHER DAY THEREAFTER AS DIRECTED 45 tablet 0   No current facility-administered medications for this visit.    Allergies as of 07/01/2020 - Review Complete 07/01/2020  Allergen Reaction Noted   Crestor [rosuvastatin]  05/16/2020    ROS:  General: Negative for anorexia, weight loss, fever, chills, fatigue, weakness. ENT: Negative for hoarseness, difficulty swallowing , nasal congestion. CV: Negative for chest pain, angina, palpitations, dyspnea on exertion, peripheral edema.  Respiratory: Negative for dyspnea at rest, dyspnea on exertion, cough, sputum, wheezing.  GI: See history of present illness. GU:  Negative for dysuria, hematuria, urinary incontinence, urinary frequency, nocturnal urination.  Endo: Negative for unusual weight change.    Physical Examination:   BP 136/84    Pulse 77    Temp 98.1 F (36.7 C) (Oral)    Wt 160 lb (72.6 kg)    BMI 29.26 kg/m   General: Well-nourished, well-developed in no acute distress.  Eyes: No icterus. Conjunctivae pink. Mouth: Oropharyngeal mucosa moist and pink , no lesions erythema or exudate. Neck: Supple, Trachea midline Abdomen: Bowel sounds are  nontender, nondistended, no hepatosplenomegaly or masses, no abdominal bruits or hernia , no rebound or guarding.   °Extremities: No lower extremity edema. No clubbing or deformities. °Neuro: Alert and oriented x 3.  Grossly intact. °Skin: Warm and dry, no jaundice.   °Psych: Alert and cooperative, normal mood and affect. ° ° °Labs: °CMP  °   °Component Value Date/Time  ° NA 140 07/01/2020 0000  ° K 3.9 07/01/2020 0000  ° CL 102 07/01/2020 0000  ° CO2 27 07/01/2020 0000  ° GLUCOSE 82  07/01/2020 0000  ° GLUCOSE 125 (H) 06/27/2020 0305  ° BUN 20 07/01/2020 0000  ° CREATININE 0.74 07/01/2020 0000  ° CALCIUM 9.8 07/01/2020 0000  ° PROT 6.2 07/01/2020 0000  ° ALBUMIN 4.1 07/01/2020 0000  ° AST 45 (H) 07/01/2020 0000  ° ALT 139 (H) 07/01/2020 0000  ° ALKPHOS 259 (H) 07/01/2020 0000  ° BILITOT 0.6 07/01/2020 0000  ° GFRNONAA 81 07/01/2020 0000  ° GFRAA 93 07/01/2020 0000  ° °Lab Results  °Component Value Date  ° WBC 5.3 06/27/2020  ° HGB 13.8 06/27/2020  ° HCT 40.3 06/27/2020  ° MCV 91.8 06/27/2020  ° PLT 187 06/27/2020  ° ° °Imaging Studies: °MR 3D Recon At Scanner ° °Result Date: 06/27/2020 °CLINICAL DATA:  Recurrent right upper quadrant and epigastric pain. Prior cholecystectomy. EXAM: MRI ABDOMEN WITHOUT AND WITH CONTRAST (INCLUDING MRCP) TECHNIQUE: Multiplanar multisequence MR imaging of the abdomen was performed both before and after the administration of intravenous contrast. Heavily T2-weighted images of the biliary and pancreatic ducts were obtained, and three-dimensional MRCP images were rendered by post processing. CONTRAST:  7mL GADAVIST GADOBUTROL 1 MMOL/ML IV SOLN COMPARISON:  CT on 04/09/2020 FINDINGS: Lower chest: No acute findings. Hepatobiliary: No hepatic masses identified. Prior cholecystectomy. No evidence of biliary ductal dilatation, with common bile duct measuring 7 mm. No evidence of choledocholithiasis. Pancreas: No mass or inflammatory changes. No evidence of pancreatic ductal dilatation or pancreas divisum. Spleen:  Within normal limits in size and appearance. Adrenals/Urinary Tract: No masses identified. A few tiny sub-cm right renal cysts are noted. No evidence of hydronephrosis. Stomach/Bowel: Visualized portion unremarkable. Vascular/Lymphatic: No pathologically enlarged lymph nodes identified. No abdominal aortic aneurysm. Other:  None. Musculoskeletal:  No suspicious bone lesions identified. IMPRESSION: Prior cholecystectomy. No evidence of biliary ductal dilatation,  choledocholithiasis, or other significant abnormality. Electronically Signed   By: John A Stahl M.D.   On: 06/27/2020 10:06  ° °MR ABDOMEN MRCP W WO CONTAST ° °Result Date: 06/27/2020 °CLINICAL DATA:  Recurrent right upper quadrant and epigastric pain. Prior cholecystectomy. EXAM: MRI ABDOMEN WITHOUT AND WITH CONTRAST (INCLUDING MRCP) TECHNIQUE: Multiplanar multisequence MR imaging of the abdomen was performed both before and after the administration of intravenous contrast. Heavily T2-weighted images of the biliary and pancreatic ducts were obtained, and three-dimensional MRCP images were rendered by post processing. CONTRAST:  7mL GADAVIST GADOBUTROL 1 MMOL/ML IV SOLN COMPARISON:  CT on 04/09/2020 FINDINGS: Lower chest: No acute findings. Hepatobiliary: No hepatic masses identified. Prior cholecystectomy. No evidence of biliary ductal dilatation, with common bile duct measuring 7 mm. No evidence of choledocholithiasis. Pancreas: No mass or inflammatory changes. No evidence of pancreatic ductal dilatation or pancreas divisum. Spleen:  Within normal limits in size and appearance. Adrenals/Urinary Tract: No masses identified. A few tiny sub-cm right renal cysts are noted. No evidence of hydronephrosis. Stomach/Bowel: Visualized portion unremarkable. Vascular/Lymphatic: No pathologically enlarged lymph nodes identified. No abdominal aortic aneurysm. Other:  None. Musculoskeletal:  No suspicious bone lesions identified.   IMPRESSION: Prior cholecystectomy. No evidence of biliary ductal dilatation, choledocholithiasis, or other significant abnormality. Electronically Signed   By: John A Stahl M.D.   On: 06/27/2020 10:06  ° °US Abdomen Limited RUQ ° °Result Date: 06/27/2020 °CLINICAL DATA:  Initial evaluation for acute abdominal pain, nausea. History of prior cholecystectomy. EXAM: ULTRASOUND ABDOMEN LIMITED RIGHT UPPER QUADRANT COMPARISON:  Prior CT from 04/09/2020. FINDINGS: Gallbladder: Prior cholecystectomy. Common bile  duct: Diameter: Up to 7 mm, within normal limits for age. Liver: No focal lesion identified. Within normal limits in parenchymal echogenicity. Portal vein is patent on color Doppler imaging with normal direction of blood flow towards the liver. Other: None. IMPRESSION: 1. Prior cholecystectomy. No biliary dilatation or other acute abnormality. 2. Normal sonographic appearance of the liver. Electronically Signed   By: Benjamin  McClintock M.D.   On: 06/27/2020 04:53  ° ° °Assessment and Plan:  ° °Valerie Curry is a 73 y.o. 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  Other liver enzymes are also improving.  We therefore discussed that given the above and an MRCP not showing CBD obstruction, ERCP is not indicated at this time and would be higher risks than benefits.  ° °I will refer patient to [facility] for evaluation of sphincter of Oddi dysfunction and possible sphincter of Oddi manometry ° °Patient agreeable to the above ° °Dr   ° °

## 2020-07-03 ENCOUNTER — Encounter: Admission: RE | Payer: Self-pay | Source: Home / Self Care

## 2020-07-03 ENCOUNTER — Ambulatory Visit: Admission: RE | Admit: 2020-07-03 | Payer: PPO | Source: Home / Self Care | Admitting: Gastroenterology

## 2020-07-03 SURGERY — ERCP, WITH INTERVENTION IF INDICATED
Anesthesia: General

## 2020-07-08 ENCOUNTER — Ambulatory Visit: Payer: PPO | Admitting: Family

## 2020-07-09 ENCOUNTER — Telehealth: Payer: Self-pay | Admitting: Family

## 2020-07-09 NOTE — Telephone Encounter (Signed)
lft vm following back up with her regarding the US renal.

## 2020-07-09 NOTE — Telephone Encounter (Signed)
Pt called back returning your call °

## 2020-07-11 NOTE — Telephone Encounter (Signed)
I spoke with pt she is not going to get the US renal she is still having stomach issues and seeing someone at [facility].

## 2020-07-15 NOTE — Telephone Encounter (Signed)
Noted  

## 2020-07-23 ENCOUNTER — Encounter: Payer: Self-pay | Admitting: Family

## 2020-07-24 DIAGNOSIS — M1711 Unilateral primary osteoarthritis, right knee: Secondary | ICD-10-CM | POA: Diagnosis not present

## 2020-07-30 DIAGNOSIS — K838 Other specified diseases of biliary tract: Secondary | ICD-10-CM | POA: Diagnosis not present

## 2020-07-30 DIAGNOSIS — Z9049 Acquired absence of other specified parts of digestive tract: Secondary | ICD-10-CM | POA: Diagnosis not present

## 2020-07-30 DIAGNOSIS — R748 Abnormal levels of other serum enzymes: Secondary | ICD-10-CM | POA: Diagnosis not present

## 2020-07-30 DIAGNOSIS — R109 Unspecified abdominal pain: Secondary | ICD-10-CM | POA: Diagnosis not present

## 2020-07-30 DIAGNOSIS — Z791 Long term (current) use of non-steroidal anti-inflammatories (NSAID): Secondary | ICD-10-CM | POA: Diagnosis not present

## 2020-08-06 ENCOUNTER — Other Ambulatory Visit: Payer: Self-pay

## 2020-08-06 ENCOUNTER — Encounter: Payer: Self-pay | Admitting: Oncology

## 2020-08-06 ENCOUNTER — Inpatient Hospital Stay: Payer: PPO | Attending: Oncology

## 2020-08-06 ENCOUNTER — Inpatient Hospital Stay (HOSPITAL_BASED_OUTPATIENT_CLINIC_OR_DEPARTMENT_OTHER): Payer: PPO | Admitting: Oncology

## 2020-08-06 VITALS — BP 133/78 | HR 73 | Temp 96.9°F | Resp 18 | Wt 162.1 lb

## 2020-08-06 DIAGNOSIS — Z87891 Personal history of nicotine dependence: Secondary | ICD-10-CM | POA: Insufficient documentation

## 2020-08-06 DIAGNOSIS — R790 Abnormal level of blood mineral: Secondary | ICD-10-CM

## 2020-08-06 DIAGNOSIS — R7989 Other specified abnormal findings of blood chemistry: Secondary | ICD-10-CM | POA: Diagnosis not present

## 2020-08-06 LAB — COMPREHENSIVE METABOLIC PANEL
ALT: 20 U/L (ref 0–44)
AST: 26 U/L (ref 15–41)
Albumin: 3.9 g/dL (ref 3.5–5.0)
Alkaline Phosphatase: 71 U/L (ref 38–126)
Anion gap: 9 (ref 5–15)
BUN: 26 mg/dL — ABNORMAL HIGH (ref 8–23)
CO2: 29 mmol/L (ref 22–32)
Calcium: 9 mg/dL (ref 8.9–10.3)
Chloride: 102 mmol/L (ref 98–111)
Creatinine, Ser: 0.77 mg/dL (ref 0.44–1.00)
GFR calc Af Amer: >60 mL/min (ref >60)
GFR calc non Af Amer: >60 mL/min (ref >60)
Glucose, Bld: 91 mg/dL (ref 70–99)
Potassium: 3.6 mmol/L (ref 3.5–5.1)
Sodium: 140 mmol/L (ref 135–145)
Total Bilirubin: 0.9 mg/dL (ref 0.3–1.2)
Total Protein: 6.7 g/dL (ref 6.5–8.1)

## 2020-08-06 LAB — CBC WITH DIFFERENTIAL/PLATELET
Abs Immature Granulocytes: 0.01 10*3/uL (ref 0.00–0.07)
Basophils Absolute: 0 10*3/uL (ref 0.0–0.1)
Basophils Relative: 1 %
Eosinophils Absolute: 0.1 10*3/uL (ref 0.0–0.5)
Eosinophils Relative: 1 %
HCT: 39.8 % (ref 36.0–46.0)
Hemoglobin: 13.5 g/dL (ref 12.0–15.0)
Immature Granulocytes: 0 %
Lymphocytes Relative: 26 %
Lymphs Abs: 1.4 10*3/uL (ref 0.7–4.0)
MCH: 30.9 pg (ref 26.0–34.0)
MCHC: 33.9 g/dL (ref 30.0–36.0)
MCV: 91.1 fL (ref 80.0–100.0)
Monocytes Absolute: 0.4 10*3/uL (ref 0.1–1.0)
Monocytes Relative: 7 %
Neutro Abs: 3.6 10*3/uL (ref 1.7–7.7)
Neutrophils Relative %: 65 %
Platelets: 185 10*3/uL (ref 150–400)
RBC: 4.37 MIL/uL (ref 3.87–5.11)
RDW: 13.5 % (ref 11.5–15.5)
WBC: 5.6 10*3/uL (ref 4.0–10.5)
nRBC: 0 % (ref 0.0–0.2)

## 2020-08-06 LAB — FERRITIN: Ferritin: 54 ng/mL (ref 11–307)

## 2020-08-06 LAB — IRON AND TIBC
Iron: 60 ug/dL (ref 28–170)
Saturation Ratios: 17 % (ref 10.4–31.8)
TIBC: 358 ug/dL (ref 250–450)
UIBC: 298 ug/dL

## 2020-08-06 NOTE — Progress Notes (Signed)
Hematology/Oncology Consult note [facility] Telephone:([phone] [phone]   Patient Care Team: Burnard Hawthorne, FNP as PCP - General (Family Medicine) Kate Sable, MD as PCP - Cardiology (Cardiology) Earlie Server, MD as Consulting Physician (Hematology and Oncology)  REFERRING PROVIDER: Burnard Hawthorne, FNP  CHIEF COMPLAINTS/REASON FOR VISIT:  Follow-up of abnormal iron testing  HISTORY OF PRESENTING ILLNESS:   Valerie Curry is a  74 y.o.  female with PMH listed below was seen in consultation at the request of  Burnard Hawthorne, FNP  for evaluation of abnormal iron testing  Patient was recently seen by gastroenterologist for evaluation of elevated liver enzymes. Patient has had some chronic abdominal pain.  She underwent laparoscopic cholecystectomy in November 2020. Recent CT  abdomen pelvis showed no acute intra-abdominal or pelvic pathology.  Moderate colonic stool burden.  No bowel obstruction.  Sigmoid diverticulosis.  Pelvic floor laxity with probable slight prolapse of the vagina and uterus. Patient was taking simvastatin. Patient has blood work done on 05/01/2020, AST 54, ALT 123, alkaline phosphatase 151. 04/17/2020, iron saturation 14, ferritin 271.  TIBC 344 .  CBC showed normal hemoglobin of 13.4. Today patient has no specific concerns.  She reports feeling well.  She has been advised by gastroenterology that elevated liver enzymes may be secondary to statin drugs.  Simvastatin has been discontinued yesterday. Patient was referred to establish care with heme-onc for further evaluation of abnormal iron testing. Patient has chronic knee problems.  No other joint pain.  Denies any shortness of breath or chest pain.  INTERVAL HISTORY Valerie Curry is a 74 y.o. female who has above history reviewed by me today presents for follow up visit for elevated ferritin. Problems and complaints are listed below: Patient reports no new  complaints.  She has been referred to [facility] for evaluation of abnormal liver function testing.  Review of Systems  Constitutional: Negative for appetite change, chills, fatigue and fever.  HENT:   Negative for hearing loss and voice change.   Eyes: Negative for eye problems.  Respiratory: Negative for chest tightness and cough.   Cardiovascular: Negative for chest pain.  Gastrointestinal: Negative for abdominal distention, abdominal pain and blood in stool.  Endocrine: Negative for hot flashes.  Genitourinary: Negative for difficulty urinating and frequency.   Musculoskeletal: Positive for arthralgias.  Skin: Negative for itching and rash.  Neurological: Negative for extremity weakness.  Hematological: Negative for adenopathy.  Psychiatric/Behavioral: Negative for confusion.    MEDICAL HISTORY:  Past Medical History:  Diagnosis Date  . Arthritis   . Complication of anesthesia    hard to wake up  . Coronary artery disease 11/2018   calcification in LAD and RCA in screening lung scan   . Family history of malignant neoplasm of breast    Maternal/paternal aunts and cousin  . Hypertension   . Lump or mass in breast 2011/2012   benign  . Obesity, unspecified   . Osteopenia    took Boniva 1 1/2 years in past-doesn't want to restart it  . Personal history of tobacco use, presenting hazards to health   . PONV (postoperative nausea and vomiting)   . Positive PPD    as a child. xray was normal.  . Systemic sclerosis ([place])     SURGICAL HISTORY: Past Surgical History:  Procedure Laterality Date  . APPENDECTOMY  1961  . BREAST BIOPSY Right Nov 2011   benign  . BREAST BIOPSY Left Nov 2012   dilated  ducts-benign  . BUNIONECTOMY Bilateral 2010   x 2  . CHOLECYSTECTOMY N/A 10/03/2019   Procedure: LAPAROSCOPIC CHOLECYSTECTOMY;  Surgeon: Fredirick Maudlin, MD;  Location: [facility] ORS;  Service: General;  Laterality: N/A;  . COLONOSCOPY  8/05   Dr. Vira Agar  . COLONOSCOPY WITH PROPOFOL N/A  01/10/2019   Procedure: COLONOSCOPY WITH PROPOFOL;  Surgeon: Virgel Manifold, MD;  Location: [facility] ENDOSCOPY;  Service: Endoscopy;  Laterality: N/A;  . DECOMPRESSIVE LUMBAR LAMINECTOMY LEVEL 2 N/A 04/30/2015   Procedure: CENTRAL DECOMPRESSIVE LUMBER LAMINECTOMY WITH POSSIBLE FORAMINOTOMIES L3-L4,L4-L5;  Surgeon: Latanya Maudlin, MD;  Location: [facility] ORS;  Service: Orthopedics;  Laterality: N/A;  . TONSILLECTOMY  1968  . TUBAL LIGATION  1974    SOCIAL HISTORY: Social History   Socioeconomic History  . Marital status: Married    Spouse name: Not on file  . Number of children: 2  . Years of education: 42  . Highest education level: Not on file  Occupational History  . Occupation: Facilities manager: [employer]  Tobacco Use  . Smoking status: Former Smoker    Packs/day: 1.00    Years: 50.00    Pack years: 50.00    Types: Cigarettes    Quit date: 11/29/2014    Years since quitting: 5.6  . Smokeless tobacco: Former Systems developer    Quit date: 04/30/2015  Vaping Use  . Vaping Use: Never used  Substance and Sexual Activity  . Alcohol use: Yes    Alcohol/week: 0.0 standard drinks    Comment: occasional glassof wine   . Drug use: No  . Sexual activity: Yes    Birth control/protection: Post-menopausal  Other Topics Concern  . Not on file  Social History Narrative   Pt is 74yo female. Pt was born in [place], moved to [place] when she was 22.  Pt is married to husband of 22 years.  Pt has 2 sons from a previous marriage and 4 step sons and 70 Grandchildren.        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        Pt works out at gym 3/week (2 days a week in a class, 1 day a week with an Dietitian).  Pt enjoys reading, spending time with her family. Pt and her husband are members of [employer] and active in their church.    Social Determinants of Health   Financial Resource Strain:   . Difficulty of Paying Living  Expenses: Not on file  Food Insecurity:   . Worried About Charity fundraiser in the Last Year: Not on file  . Ran Out of Food in the Last Year: Not on file  Transportation Needs:   . Lack of Transportation (Medical): Not on file  . Lack of Transportation (Non-Medical): Not on file  Physical Activity:   . Days of Exercise per Week: Not on file  . Minutes of Exercise per Session: Not on file  Stress:   . Feeling of Stress : Not on file  Social Connections:   . Frequency of Communication with Friends and Family: Not on file  . Frequency of Social Gatherings with Friends and Family: Not on file  . Attends Religious Services: Not on file  . Active Member of Clubs or Organizations: Not on file  . Attends Archivist Meetings: Not on file  . Marital Status: Not on file  Intimate Partner Violence:   . Fear of Current or  Ex-Partner: Not on file  . Emotionally Abused: Not on file  . Physically Abused: Not on file  . Sexually Abused: Not on file    FAMILY HISTORY: Family History  Problem Relation Age of Onset  . Lung cancer Father   . Heart disease Father 94       Cardiac Arrest  . Heart disease Mother   . Hypertension Mother   . Stroke Mother   . Other Brother        blood clots  . Other Other        hypercoag-family history  . Cancer Maternal Aunt        Breast cancer  . Breast cancer Maternal Aunt 80  . Breast cancer Paternal Aunt 29  . Breast cancer Cousin     ALLERGIES:  is allergic to crestor [rosuvastatin].  MEDICATIONS:  Current Outpatient Medications  Medication Sig Dispense Refill  . Ascorbic Acid 500 MG CHEW Chew by mouth.    Marland Kitchen aspirin EC 81 MG tablet Take 81 mg by mouth daily.    . Cholecalciferol (VITAMIN D) 50 MCG (2000 UT) CAPS Take 2,000 Units by mouth daily.    . hydrochlorothiazide (HYDRODIURIL) 25 MG tablet TAKE 1/2 TABLET BY MOUTH DAILY 90 tablet 3  . ibuprofen (ADVIL) 800 MG tablet Take 1 tablet (800 mg total) by mouth every 8 (eight) hours  as needed. 30 tablet 0  . meloxicam (MOBIC) 15 MG tablet Take 15 mg by mouth as needed.     Marland Kitchen omeprazole (PRILOSEC) 20 MG capsule Take 1 capsule (20 mg total) by mouth 2 (two) times daily before a meal. Take an hour before meal (Patient taking differently: Take 20 mg by mouth daily. Take an hour before meal) 60 capsule 1   No current facility-administered medications for this visit.     PHYSICAL EXAMINATION: ECOG PERFORMANCE STATUS: 1 - Symptomatic but completely ambulatory Vitals:   08/06/20 1403  BP: 133/78  Pulse: 73  Resp: 18  Temp: (!) 96.9 F (36.1 C)   Filed Weights   08/06/20 1403  Weight: 162 lb 1.6 oz (73.5 kg)    Physical Exam Constitutional:      General: She is not in acute distress. HENT:     Head: Normocephalic and atraumatic.  Eyes:     General: No scleral icterus. Cardiovascular:     Rate and Rhythm: Normal rate and regular rhythm.     Heart sounds: Normal heart sounds.  Pulmonary:     Effort: Pulmonary effort is normal. No respiratory distress.     Breath sounds: No wheezing.  Abdominal:     General: Bowel sounds are normal. There is no distension.     Palpations: Abdomen is soft.  Musculoskeletal:        General: No deformity. Normal range of motion.     Cervical back: Normal range of motion and neck supple.  Skin:    General: Skin is warm and dry.     Findings: No erythema or rash.  Neurological:     Mental Status: She is alert and oriented to person, place, and time. Mental status is at baseline.     Cranial Nerves: No cranial nerve deficit.     Coordination: Coordination normal.  Psychiatric:        Mood and Affect: Mood normal.     LABORATORY DATA:  I have reviewed the data as listed Lab Results  Component Value Date   WBC 5.6 08/06/2020   HGB 13.5 08/06/2020  HCT 39.8 08/06/2020   MCV 91.1 08/06/2020   PLT 185 08/06/2020   Recent Labs    05/06/20 1553 05/06/20 1553 05/13/20 1028 05/13/20 1028 06/24/20 1400 06/27/20 0305  07/01/20 0000 08/06/20 1344  NA  --   --   --   --   --  140 140 140  K  --   --   --   --   --  3.9 3.9 3.6  CL  --   --   --   --   --  103 102 102  CO2  --   --   --   --   --  28 27 29   GLUCOSE  --   --   --   --   --  125* 82 91  BUN  --   --   --   --   --  17 20 26*  CREATININE  --   --   --   --   --  0.74 0.74 0.77  CALCIUM  --   --   --   --   --  8.9 9.8 9.0  GFRNONAA  --   --   --   --   --  >60 81 >60  GFRAA  --   --   --   --   --  >60 93 >60  PROT 6.6   < > 6.3   < > 6.1 6.9 6.2 6.7  ALBUMIN 4.1   < > 4.2   < > 3.9 4.0 4.1 3.9  AST 56*   < > 27   < > 429* 476* 45* 26  ALT 97*   < > 31   < > 477* 459* 139* 20  ALKPHOS 139*   < > 122*   < > 300* 284* 259* 71  BILITOT 0.6   < > 0.5   < > 1.3* 2.8* 0.6 0.9  BILIDIR 0.1  --  0.16  --  0.66*  --   --   --   IBILI 0.5  --   --   --   --   --   --   --    < > = values in this interval not displayed.   Iron/TIBC/Ferritin/ %Sat    Component Value Date/Time   IRON 60 08/06/2020 1344   IRON 47 04/17/2020 1416   TIBC 358 08/06/2020 1344   TIBC 344 04/17/2020 1416   FERRITIN 54 08/06/2020 1344   FERRITIN 271 (H) 04/17/2020 1416   IRONPCTSAT 17 08/06/2020 1344   IRONPCTSAT 14 (L) 04/17/2020 1416      RADIOGRAPHIC STUDIES: I have personally reviewed the radiological images as listed and agreed with the findings in the report. No results found.    ASSESSMENT & PLAN:  1. Elevated ferritin    Labs reviewed and discussed with patient. Iron panel showed improved ferritin level at 54, iron saturation 17.  Previous work-up also showed that she is negative for hemochromatosis mutations. Transaminitis and elevated alkaline phosphatase have both resolved today. I will hold any additional work-up at this point. She will be discharged from my clinic.  Recommend patient to continue follow-up with primary care provider.   All questions were answered. The patient knows to call the clinic with any problems questions or  concerns.  cc Arnett, Yvetta Coder, FNP   Earlie Server, MD, PhD Hematology Oncology [facility] Pager- [phone] 08/06/2020

## 2020-08-07 ENCOUNTER — Inpatient Hospital Stay: Payer: PPO

## 2020-08-07 ENCOUNTER — Inpatient Hospital Stay: Payer: PPO | Admitting: Oncology

## 2020-08-29 ENCOUNTER — Ambulatory Visit (INDEPENDENT_AMBULATORY_CARE_PROVIDER_SITE_OTHER): Payer: PPO

## 2020-08-29 ENCOUNTER — Other Ambulatory Visit: Payer: Self-pay

## 2020-08-29 DIAGNOSIS — Z23 Encounter for immunization: Secondary | ICD-10-CM

## 2020-11-07 ENCOUNTER — Ambulatory Visit: Payer: PPO | Admitting: Cardiology

## 2020-11-20 ENCOUNTER — Encounter: Payer: Self-pay | Admitting: Cardiology

## 2020-11-20 ENCOUNTER — Ambulatory Visit: Payer: PPO | Admitting: Cardiology

## 2020-11-20 ENCOUNTER — Other Ambulatory Visit: Payer: Self-pay

## 2020-11-20 VITALS — BP 140/80 | HR 75 | Ht 62.0 in | Wt 169.0 lb

## 2020-11-20 DIAGNOSIS — I2510 Atherosclerotic heart disease of native coronary artery without angina pectoris: Secondary | ICD-10-CM | POA: Diagnosis not present

## 2020-11-20 DIAGNOSIS — I10 Essential (primary) hypertension: Secondary | ICD-10-CM

## 2020-11-20 MED ORDER — REPATHA SURECLICK 140 MG/ML ~~LOC~~ SOAJ: 1.0000 "pen " | SUBCUTANEOUS | 3 refills | Status: DC

## 2020-11-20 NOTE — Progress Notes (Signed)
Cardiology Office Note:    Date:  11/20/2020   ID:  Valerie Curry, Valerie Curry 05/16/1946, MRN [id number]  PCP:  Allegra Grana, FNP  Cardiologist:  Debbe Odea, MD  Electrophysiologist:  None   Referring MD: Allegra Grana, FNP   Chief Complaint  Patient presents with  . Follow-up    12 month F/U-Would like to discuss statin alternatives. Rosuvastatin stopped due to elevated liver function levels    History of Present Illness:    Valerie Curry is a 74 y.o. female with a hx of hypertension, CAD (calcifications in RCA, LAD on chest CT, former smoker x40+ years who presents follow-up. Patient has a history of coronary artery calcifications noted on chest CT for lung cancer screening. Aspirin, statin was started. Patient noted to have elevated LFTs, Crestor was stopped about 6 months ago.  Stopping statins resolved patient's abnormal liver function test.  She denies chest pain or shortness of breath.  Exercises almost daily without any symptoms.  Prior notes CT chest lung cancer screening 11/2018 showed calcified plaque in LAD and RCA.   Past Medical History:  Diagnosis Date  . Arthritis   . Complication of anesthesia    hard to wake up  . Coronary artery disease 11/2018   calcification in LAD and RCA in screening lung scan   . Family history of malignant neoplasm of breast    Maternal/paternal aunts and cousin  . Hypertension   . Lump or mass in breast 2011/2012   benign  . Obesity, unspecified   . Osteopenia    took Boniva 1 1/2 years in past-doesn't want to restart it  . Personal history of tobacco use, presenting hazards to health   . PONV (postoperative nausea and vomiting)   . Positive PPD    as a child. xray was normal.  . Systemic sclerosis [facility])     Past Surgical History:  Procedure Laterality Date  . APPENDECTOMY  1961  . BREAST BIOPSY Right Nov 2011   benign  . BREAST BIOPSY Left Nov 2012   dilated ducts-benign  . BUNIONECTOMY Bilateral 2010    x 2  . CHOLECYSTECTOMY N/A 10/03/2019   Procedure: LAPAROSCOPIC CHOLECYSTECTOMY;  Surgeon: Duanne Guess, MD;  Location: [facility] ORS;  Service: General;  Laterality: N/A;  . COLONOSCOPY  8/05   Dr. Mechele Collin  . COLONOSCOPY WITH PROPOFOL N/A 01/10/2019   Procedure: COLONOSCOPY WITH PROPOFOL;  Surgeon: Pasty Spillers, MD;  Location: [facility] ENDOSCOPY;  Service: Endoscopy;  Laterality: N/A;  . DECOMPRESSIVE LUMBAR LAMINECTOMY LEVEL 2 N/A 04/30/2015   Procedure: CENTRAL DECOMPRESSIVE LUMBER LAMINECTOMY WITH POSSIBLE FORAMINOTOMIES L3-L4,L4-L5;  Surgeon: Ranee Gosselin, MD;  Location: [facility] ORS;  Service: Orthopedics;  Laterality: N/A;  . TONSILLECTOMY  1968  . TUBAL LIGATION  1974    Current Medications: Current Meds  Medication Sig  . Ascorbic Acid 500 MG CHEW Chew by mouth daily.  Marland Kitchen aspirin EC 81 MG tablet Take 81 mg by mouth daily.  . Cholecalciferol (VITAMIN D) 50 MCG (2000 UT) CAPS Take 2,000 Units by mouth daily.  . hydrochlorothiazide (HYDRODIURIL) 25 MG tablet TAKE 1/2 TABLET BY MOUTH DAILY  . ibuprofen (ADVIL) 800 MG tablet Take 1 tablet (800 mg total) by mouth every 8 (eight) hours as needed.  . meloxicam (MOBIC) 15 MG tablet Take 15 mg by mouth as needed.      Allergies:   Crestor [rosuvastatin]   Social History   Socioeconomic History  . Marital status: Married  Spouse name: Not on file  . Number of children: 2  . Years of education: 41  . Highest education level: Not on file  Occupational History  . Occupation: Facilities manager: [employer]  Tobacco Use  . Smoking status: Former Smoker    Packs/day: 1.00    Years: 50.00    Pack years: 50.00    Types: Cigarettes    Quit date: 11/29/2014    Years since quitting: 5.9  . Smokeless tobacco: Former Systems developer    Quit date: 04/30/2015  Vaping Use  . Vaping Use: Never used  Substance and Sexual Activity  . Alcohol use: Yes    Alcohol/week: 0.0 standard drinks    Comment: occasional glassof wine   . Drug  use: No  . Sexual activity: Yes    Birth control/protection: Post-menopausal  Other Topics Concern  . Not on file  Social History Narrative   Pt is 74yo female. Pt was born in [place], moved to [place] when she was 21.  Pt is married to husband of 22 years.  Pt has 2 sons from a previous marriage and 4 step sons and 110 Grandchildren.        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        Pt works out at gym 3/week (2 days a week in a class, 1 day a week with an Dietitian).  Pt enjoys reading, spending time with her family. Pt and her husband are members of [employer] and active in their church.    Social Determinants of Health   Financial Resource Strain: Not on file  Food Insecurity: Not on file  Transportation Needs: Not on file  Physical Activity: Not on file  Stress: Not on file  Social Connections: Not on file     Family History: The patient's family history includes Breast cancer in her cousin; Breast cancer (age of onset: 66) in her maternal aunt and paternal aunt; Cancer in her maternal aunt; Heart disease in her mother; Heart disease (age of onset: 73) in her father; Hypertension in her mother; Lung cancer in her father; Other in her brother and another family member; Stroke in her mother.  ROS:   Please see the history of present illness.     All other systems reviewed and are negative.  EKGs/Labs/Other Studies Reviewed:    The following studies were reviewed today:   EKG:  EKG is  ordered today.  The ekg ordered today demonstrates normal sinus rhythm, left bundle branch block.  Recent Labs: 12/26/2019: TSH 0.93 08/06/2020: ALT 20; BUN 26; Creatinine, Ser 0.77; Hemoglobin 13.5; Platelets 185; Potassium 3.6; Sodium 140  Recent Lipid Panel    Component Value Date/Time   CHOL 132 12/26/2019 0803   TRIG 56.0 12/26/2019 0803   HDL 73.30 12/26/2019 0803   CHOLHDL 2 12/26/2019 0803   VLDL 11.2 12/26/2019 0803    LDLCALC 48 12/26/2019 0803   LDLDIRECT 100.2 07/30/2008 1017    Physical Exam:    VS:  BP 140/80 (BP Location: Left Arm, Patient Position: Sitting, Cuff Size: Large)   Pulse 75   Ht 5\' 2"  (1.575 m)   Wt 169 lb (76.7 kg)   SpO2 97%   BMI 30.91 kg/m     Wt Readings from Last 3 Encounters:  11/20/20 169 lb (76.7 kg)  08/06/20 162 lb 1.6 oz (73.5 kg)  07/01/20 160 lb (72.6 kg)  GEN:  Well nourished, well developed in no acute distress HEENT: Normal NECK: No JVD; No carotid bruits LYMPHATICS: No lymphadenopathy CARDIAC: RRR, no murmurs, rubs, gallops RESPIRATORY:  Clear to auscultation without rales, wheezing or rhonchi  ABDOMEN: Soft, non-tender, non-distended MUSCULOSKELETAL:  No edema; No deformity  SKIN: Warm and dry NEUROLOGIC:  Alert and oriented x 3 PSYCHIATRIC:  Normal affect   ASSESSMENT:    1. Coronary artery disease involving native coronary artery of native heart without angina pectoris   2. Primary hypertension      PLAN:    In order of problems listed above:  1. CAD, calcifications in the RCA and LAD.  Denies chest pain.  LDL goal less than 70.  Continue aspirin 81 mg daily.  Not on statin due to abnormal liver function test.  Her LFTs normalized after stopping statin.  We will not place patient on Zetia as this has also been shown to cause abnormal LFTs.  We will place patient on PCSK9. 2. Hypertension, continue HCTZ.  Follow-up in 3 to 6 months  This note was generated in part or whole with voice recognition software. Voice recognition is usually quite accurate but there are transcription errors that can and very often do occur. I apologize for any typographical errors that were not detected and corrected.  Medication Adjustments/Labs and Tests Ordered: Current medicines are reviewed at length with the patient today.  Concerns regarding medicines are outlined above.  Orders Placed This Encounter  Procedures  . EKG 12-Lead   No orders of the defined  types were placed in this encounter.   Patient Instructions  Medication Instructions:   PLEASE CALL your Insurance and ask if they will approve either Repatha, or Praluent, and then give our office a call back so we can prescribe one for you. [phone].  *If you need a refill on your cardiac medications before your next appointment, please call your pharmacy*   Lab Work: None Ordered If you have labs (blood work) drawn today and your tests are completely normal, you will receive your results only by: Marland Kitchen MyChart Message (if you have MyChart) OR . A paper copy in the mail If you have any lab test that is abnormal or we need to change your treatment, we will call you to review the results.   Testing/Procedures: None Ordered   Follow-Up: At [facility], you and your health needs are our priority.  As part of our continuing mission to provide you with exceptional heart care, we have created designated Provider Care Teams.  These Care Teams include your primary Cardiologist (physician) and Advanced Practice Providers (APPs -  Physician Assistants and Nurse Practitioners) who all work together to provide you with the care you need, when you need it.  We recommend signing up for the patient portal called "MyChart".  Sign up information is provided on this After Visit Summary.  MyChart is used to connect with patients for Virtual Visits (Telemedicine).  Patients are able to view lab/test results, encounter notes, upcoming appointments, etc.  Non-urgent messages can be sent to your provider as well.   To learn more about what you can do with MyChart, go to [email or web address].    Your next appointment:   3-6 months   The format for your next appointment:   In Person  Provider:   Kate Sable, MD   Other Instructions       Signed, Kate Sable, MD  11/20/2020 1:03 PM    [facility] 

## 2020-11-20 NOTE — Patient Instructions (Signed)
Medication Instructions:   PLEASE CALL your Insurance and ask if they will approve either Repatha, or Praluent, and then give our office a call back so we can prescribe one for you. [phone].  *If you need a refill on your cardiac medications before your next appointment, please call your pharmacy*   Lab Work: None Ordered If you have labs (blood work) drawn today and your tests are completely normal, you will receive your results only by: Marland Kitchen MyChart Message (if you have MyChart) OR . A paper copy in the mail If you have any lab test that is abnormal or we need to change your treatment, we will call you to review the results.   Testing/Procedures: None Ordered   Follow-Up: At [facility], you and your health needs are our priority.  As part of our continuing mission to provide you with exceptional heart care, we have created designated Provider Care Teams.  These Care Teams include your primary Cardiologist (physician) and Advanced Practice Providers (APPs -  Physician Assistants and Nurse Practitioners) who all work together to provide you with the care you need, when you need it.  We recommend signing up for the patient portal called "MyChart".  Sign up information is provided on this After Visit Summary.  MyChart is used to connect with patients for Virtual Visits (Telemedicine).  Patients are able to view lab/test results, encounter notes, upcoming appointments, etc.  Non-urgent messages can be sent to your provider as well.   To learn more about what you can do with MyChart, go to [email or web address].    Your next appointment:   3-6 months   The format for your next appointment:   In Person  Provider:   Kate Sable, MD   Other Instructions

## 2020-11-24 ENCOUNTER — Telehealth: Payer: Self-pay | Admitting: Cardiology

## 2020-11-24 NOTE — Telephone Encounter (Signed)
Prior Authorization has been started for Repatha Sureclick 140 mg/mL. Please see below.  [facility] (Key: [id number])  [employer] has received your information, and the request will be reviewed. You may close this dialog, return to your dashboard, and perform other tasks.  You will receive an electronic determination in CoverMyMeds. You can see the latest determination by locating this request on your dashboard or by reopening this request. You will also receive a faxed copy of the determination. If you have any questions please contact [employer] at [phone].  If you need assistance, please chat with CoverMyMeds or call us at [phone].

## 2020-11-26 NOTE — Telephone Encounter (Signed)
Spoke with representative from [employer] to give Dx codes related to Repatha Rx. Repatha has been approved-she will be faxing over confirmation of this shortly.

## 2020-12-29 ENCOUNTER — Other Ambulatory Visit: Payer: Self-pay | Admitting: Family

## 2020-12-29 DIAGNOSIS — Z1231 Encounter for screening mammogram for malignant neoplasm of breast: Secondary | ICD-10-CM

## 2020-12-30 ENCOUNTER — Other Ambulatory Visit: Payer: Self-pay

## 2020-12-30 ENCOUNTER — Encounter: Payer: Self-pay | Admitting: Family

## 2020-12-30 ENCOUNTER — Ambulatory Visit (INDEPENDENT_AMBULATORY_CARE_PROVIDER_SITE_OTHER): Payer: PPO | Admitting: Family

## 2020-12-30 VITALS — BP 140/70 | HR 88 | Temp 98.2°F | Ht 62.01 in | Wt 170.0 lb

## 2020-12-30 DIAGNOSIS — I2510 Atherosclerotic heart disease of native coronary artery without angina pectoris: Secondary | ICD-10-CM | POA: Diagnosis not present

## 2020-12-30 DIAGNOSIS — Z0000 Encounter for general adult medical examination without abnormal findings: Secondary | ICD-10-CM

## 2020-12-30 DIAGNOSIS — R1013 Epigastric pain: Secondary | ICD-10-CM

## 2020-12-30 DIAGNOSIS — E041 Nontoxic single thyroid nodule: Secondary | ICD-10-CM | POA: Diagnosis not present

## 2020-12-30 DIAGNOSIS — E042 Nontoxic multinodular goiter: Secondary | ICD-10-CM

## 2020-12-30 DIAGNOSIS — I10 Essential (primary) hypertension: Secondary | ICD-10-CM

## 2020-12-30 DIAGNOSIS — Z0001 Encounter for general adult medical examination with abnormal findings: Secondary | ICD-10-CM | POA: Diagnosis not present

## 2020-12-30 HISTORY — DX: Nontoxic multinodular goiter: E04.2

## 2020-12-30 HISTORY — DX: Nontoxic single thyroid nodule: E04.1

## 2020-12-30 LAB — COMPREHENSIVE METABOLIC PANEL
ALT: 25 U/L (ref 0–35)
AST: 23 U/L (ref 0–37)
Albumin: 4.1 g/dL (ref 3.5–5.2)
Alkaline Phosphatase: 88 U/L (ref 39–117)
BUN: 20 mg/dL (ref 6–23)
CO2: 30 mEq/L (ref 19–32)
Calcium: 9.8 mg/dL (ref 8.4–10.5)
Chloride: 103 mEq/L (ref 96–112)
Creatinine, Ser: 0.71 mg/dL (ref 0.40–1.20)
GFR: 83.95 mL/min (ref >60.00)
Glucose, Bld: 81 mg/dL (ref 70–99)
Potassium: 4 mEq/L (ref 3.5–5.1)
Sodium: 140 mEq/L (ref 135–145)
Total Bilirubin: 0.6 mg/dL (ref 0.2–1.2)
Total Protein: 6.4 g/dL (ref 6.0–8.3)

## 2020-12-30 LAB — TSH: TSH: 1.46 u[IU]/mL (ref 0.35–4.50)

## 2020-12-30 LAB — LIPID PANEL
Cholesterol: 129 mg/dL (ref 0–200)
HDL: 77.2 mg/dL (ref >39.00)
LDL Cholesterol: 35 mg/dL (ref 0–99)
NonHDL: 51.98
Total CHOL/HDL Ratio: 2
Triglycerides: 86 mg/dL (ref 0.0–149.0)
VLDL: 17.2 mg/dL (ref 0.0–40.0)

## 2020-12-30 NOTE — Assessment & Plan Note (Signed)
Advised US thyroid. Patient prefers to wait until CT chest updated. She will call me if she decides to pursue US thyroid.

## 2020-12-30 NOTE — Assessment & Plan Note (Signed)
Elevated. Advised to either increase hctz to 25mg  OR start amlodipine 5mg . She prefers to monitor BP and call with readings if not close to 130/80.

## 2020-12-30 NOTE — Assessment & Plan Note (Addendum)
CBE performed. Patient declines pelvic exam in the absence of new complaints and she declines further cervical cancer screening. She will schedule DEXA. She declines dedicated AAA screen after CT abdomen and pelvis showed normal aorta. She will call to schedule CT chest for lung cancer screen

## 2020-12-30 NOTE — Patient Instructions (Addendum)
Call me after CT lung cancer scan as I would like you to have thyroid ultrasound  Please ensure you schedule CT lung cancer screen  Please call  and schedule your bone density scan as discussed.   [place]  [place], [place]   Health Maintenance for Postmenopausal Women Menopause is a normal process in which your ability to get pregnant comes to an end. This process happens slowly over many months or years, usually between the ages of 16 and 65. Menopause is complete when you have missed your menstrual periods for 12 months. It is important to talk with your health care provider about some of the most common conditions that affect women after menopause (postmenopausal women). These include heart disease, cancer, and bone loss (osteoporosis). Adopting a healthy lifestyle and getting preventive care can help to promote your health and wellness. The actions you take can also lower your chances of developing some of these common conditions. What should I know about menopause? During menopause, you may get a number of symptoms, such as:  Hot flashes. These can be moderate or severe.  Night sweats.  Decrease in sex drive.  Mood swings.  Headaches.  Tiredness.  Irritability.  Memory problems.  Insomnia. Choosing to treat or not to treat these symptoms is a decision that you make with your health care provider. Do I need hormone replacement therapy?  Hormone replacement therapy is effective in treating symptoms that are caused by menopause, such as hot flashes and night sweats.  Hormone replacement carries certain risks, especially as you become older. If you are thinking about using estrogen or estrogen with progestin, discuss the benefits and risks with your health care provider. What is my risk for heart disease and stroke? The risk of heart disease, heart attack, and stroke increases as you age. One of the causes may be a  change in the body's hormones during menopause. This can affect how your body uses dietary fats, triglycerides, and cholesterol. Heart attack and stroke are medical emergencies. There are many things that you can do to help prevent heart disease and stroke. Watch your blood pressure  High blood pressure causes heart disease and increases the risk of stroke. This is more likely to develop in people who have high blood pressure readings, are of African descent, or are overweight.  Have your blood pressure checked: ? Every 3-5 years if you are 18-1 years of age. ? Every year if you are 31 years old or older. Eat a healthy diet  Eat a diet that includes plenty of vegetables, fruits, low-fat dairy products, and lean protein.  Do not eat a lot of foods that are high in solid fats, added sugars, or sodium.   Get regular exercise Get regular exercise. This is one of the most important things you can do for your health. Most adults should:  Try to exercise for at least 150 minutes each week. The exercise should increase your heart rate and make you sweat (moderate-intensity exercise).  Try to do strengthening exercises at least twice each week. Do these in addition to the moderate-intensity exercise.  Spend less time sitting. Even light physical activity can be beneficial. Other tips  Work with your health care provider to achieve or maintain a healthy weight.  Do not use any products that contain nicotine or tobacco, such as cigarettes, e-cigarettes, and chewing tobacco. If you need help quitting, ask your health care provider.  Know your  numbers. Ask your health care provider to check your cholesterol and your blood sugar (glucose). Continue to have your blood tested as directed by your health care provider. Do I need screening for cancer? Depending on your health history and family history, you may need to have cancer screening at different stages of your life. This may include screening  for:  Breast cancer.  Cervical cancer.  Lung cancer.  Colorectal cancer. What is my risk for osteoporosis? After menopause, you may be at increased risk for osteoporosis. Osteoporosis is a condition in which bone destruction happens more quickly than new bone creation. To help prevent osteoporosis or the bone fractures that can happen because of osteoporosis, you may take the following actions:  If you are 53-28 years old, get at least 1,000 mg of calcium and at least 600 mg of vitamin D per day.  If you are older than age 30 but younger than age 69, get at least 1,200 mg of calcium and at least 600 mg of vitamin D per day.  If you are older than age 42, get at least 1,200 mg of calcium and at least 800 mg of vitamin D per day. Smoking and drinking excessive alcohol increase the risk of osteoporosis. Eat foods that are rich in calcium and vitamin D, and do weight-bearing exercises several times each week as directed by your health care provider. How does menopause affect my mental health? Depression may occur at any age, but it is more common as you become older. Common symptoms of depression include:  Low or sad mood.  Changes in sleep patterns.  Changes in appetite or eating patterns.  Feeling an overall lack of motivation or enjoyment of activities that you previously enjoyed.  Frequent crying spells. Talk with your health care provider if you think that you are experiencing depression. General instructions See your health care provider for regular wellness exams and vaccines. This may include:  Scheduling regular health, dental, and eye exams.  Getting and maintaining your vaccines. These include: ? Influenza vaccine. Get this vaccine each year before the flu season begins. ? Pneumonia vaccine. ? Shingles vaccine. ? Tetanus, diphtheria, and pertussis (Tdap) booster vaccine. Your health care provider may also recommend other immunizations. Tell your health care provider  if you have ever been abused or do not feel safe at home. Summary  Menopause is a normal process in which your ability to get pregnant comes to an end.  This condition causes hot flashes, night sweats, decreased interest in sex, mood swings, headaches, or lack of sleep.  Treatment for this condition may include hormone replacement therapy.  Take actions to keep yourself healthy, including exercising regularly, eating a healthy diet, watching your weight, and checking your blood pressure and blood sugar levels.  Get screened for cancer and depression. Make sure that you are up to date with all your vaccines. This information is not intended to replace advice given to you by your health care provider. Make sure you discuss any questions you have with your health care provider. Document Revised: 11/08/2018 Document Reviewed: 11/08/2018 Elsevier Patient Education  2021 [employer].

## 2020-12-30 NOTE — Progress Notes (Signed)
Subjective:    Patient ID: Valerie Curry, female    DOB: [birth year], 75 y.o.   MRN: [id number]  CC: Valerie Curry is a 75 y.o. female who presents today for physical exam and follow up.    HPI: Feels well today No new complaints  HTN- compliant with HCTZ 25mg   HLD- compliant with repatha which was started by Dr Charlestine Night CAD- continue asa 81mg . No bleeding    Abdominal pain- lessened 'tremendously'. She doesn't She takes ibuprofen prn with relief. No longer on omeprazole.  seen by [facility] GI, Dr Dell Ponto. She did have EGD. 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   Consult with Dr Tasia Catchings regarded elevated ferritin, negative for hemochromatosis mutations and discharged from clinic.   Thyroid nodules seen on CT lung cancer screen. No pain or trouble swallowing.   Colorectal Cancer Screening: UTD . Last 12/2018 and repeat in 5 years Breast Cancer Screening: Mammogram scheduled 02/17/21 Cervical Cancer Screening: Last done 8 years ago, abstracted normal negative HPV. Declines pelvic exam. No pelvic pain, vaginal bleeding. She has uterine prolapse which is not new which can be uncomfortable when sitting.  Bone Health screening/DEXA for 65+:Due  Lung Cancer Screening:  Due next month  No AAA seen on 03/2020 . No family h/o AAA.         Tetanus - due        Pneumococcal - Complete  Labs: Screening labs today. Exercise: Gets regular exercise.    Smoking/tobacco use: Nonsmoker.     HISTORY:  Past Medical History:  Diagnosis Date  . Arthritis   . Complication of anesthesia    hard to wake up  . Coronary artery disease 11/2018   calcification in LAD and RCA in screening lung scan   . Family history of malignant neoplasm of breast    Maternal/paternal aunts and cousin  . Hypertension   . Lump or mass in breast 2011/2012   benign  . Obesity, unspecified   . Osteopenia    took Boniva 1 1/2 years in past-doesn't want to restart it  . Personal  history of tobacco use, presenting hazards to health   . PONV (postoperative nausea and vomiting)   . Positive PPD    as a child. xray was normal.  . Systemic sclerosis [facility])     Past Surgical History:  Procedure Laterality Date  . APPENDECTOMY  1961  . BREAST BIOPSY Right Nov 2011   benign  . BREAST BIOPSY Left Nov 2012   dilated ducts-benign  . BUNIONECTOMY Bilateral 2010   x 2  . CHOLECYSTECTOMY N/A 10/03/2019   Procedure: LAPAROSCOPIC CHOLECYSTECTOMY;  Surgeon: Fredirick Maudlin, MD;  Location: [facility] ORS;  Service: General;  Laterality: N/A;  . COLONOSCOPY  8/05   Dr. Vira Agar  . COLONOSCOPY WITH PROPOFOL N/A 01/10/2019   Procedure: COLONOSCOPY WITH PROPOFOL;  Surgeon: Virgel Manifold, MD;  Location: [facility] ENDOSCOPY;  Service: Endoscopy;  Laterality: N/A;  . DECOMPRESSIVE LUMBAR LAMINECTOMY LEVEL 2 N/A 04/30/2015   Procedure: CENTRAL DECOMPRESSIVE LUMBER LAMINECTOMY WITH POSSIBLE FORAMINOTOMIES L3-L4,L4-L5;  Surgeon: Latanya Maudlin, MD;  Location: [facility] ORS;  Service: Orthopedics;  Laterality: N/A;  . TONSILLECTOMY  1968  . TUBAL LIGATION  1974   Family History  Problem Relation Age of Onset  . Lung cancer Father   . Heart disease Father 74       Cardiac Arrest  . Heart disease Mother   . Hypertension Mother   .  Stroke Mother   . Other Brother        blood clots  . Other Other        hypercoag-family history  . Cancer Maternal Aunt        Breast cancer  . Breast cancer Maternal Aunt 24  . Breast cancer Paternal Aunt 66  . Breast cancer Cousin       ALLERGIES: Crestor [rosuvastatin]  Current Outpatient Medications on File Prior to Visit  Medication Sig Dispense Refill  . Ascorbic Acid 500 MG CHEW Chew by mouth daily.    Marland Kitchen aspirin EC 81 MG tablet Take 81 mg by mouth daily.    . Cholecalciferol (VITAMIN D) 50 MCG (2000 UT) CAPS Take 2,000 Units by mouth daily.    . Evolocumab (REPATHA SURECLICK) 440 MG/ML SOAJ Inject 1 pen into the skin every 14 (fourteen) days. 6 mL 3   . hydrochlorothiazide (HYDRODIURIL) 25 MG tablet TAKE 1/2 TABLET BY MOUTH DAILY 90 tablet 3  . ibuprofen (ADVIL) 800 MG tablet Take 1 tablet (800 mg total) by mouth every 8 (eight) hours as needed. 30 tablet 0  . meloxicam (MOBIC) 15 MG tablet Take 15 mg by mouth as needed.      No current facility-administered medications on file prior to visit.    Social History   Tobacco Use  . Smoking status: Former Smoker    Packs/day: 1.00    Years: 50.00    Pack years: 50.00    Types: Cigarettes    Quit date: 11/29/2014    Years since quitting: 6.0  . Smokeless tobacco: Former Systems developer    Quit date: 04/30/2015  Vaping Use  . Vaping Use: Never used  Substance Use Topics  . Alcohol use: Yes    Alcohol/week: 0.0 standard drinks    Comment: occasional glassof wine   . Drug use: No    Review of Systems  Constitutional: Negative for chills, fever and unexpected weight change.  HENT: Negative for congestion.   Respiratory: Negative for cough.   Cardiovascular: Negative for chest pain, palpitations and leg swelling.  Gastrointestinal: Negative for nausea and vomiting.  Genitourinary: Positive for vaginal pain (h/o uterine prolapse). Negative for pelvic pain.  Musculoskeletal: Negative for arthralgias and myalgias.  Skin: Negative for rash.  Neurological: Negative for headaches.  Hematological: Negative for adenopathy.  Psychiatric/Behavioral: Negative for confusion.      Objective:    BP 140/70   Pulse 88   Temp 98.2 F (36.8 C)   Ht 5' 2.01" (1.575 m)   Wt 170 lb (77.1 kg)   SpO2 97%   BMI 31.09 kg/m   BP Readings from Last 3 Encounters:  12/30/20 140/70  11/20/20 140/80  08/06/20 133/78   Wt Readings from Last 3 Encounters:  12/30/20 170 lb (77.1 kg)  11/20/20 169 lb (76.7 kg)  08/06/20 162 lb 1.6 oz (73.5 kg)    Physical Exam Vitals reviewed.  Constitutional:      Appearance: She is well-developed and well-nourished.  Eyes:     Conjunctiva/sclera: Conjunctivae normal.   Neck:     Thyroid: No thyroid mass or thyromegaly.  Cardiovascular:     Rate and Rhythm: Normal rate and regular rhythm.     Pulses: Normal pulses.     Heart sounds: Normal heart sounds.  Pulmonary:     Effort: Pulmonary effort is normal.     Breath sounds: Normal breath sounds. No wheezing, rhonchi or rales.     Comments: CBE performed.  Chest:  Breasts: Breasts are symmetrical.     Right: No inverted nipple, mass, nipple discharge, skin change or tenderness.     Left: No inverted nipple, mass, nipple discharge, skin change or tenderness.    Lymphadenopathy:     Head:     Right side of head: No submental, submandibular, tonsillar, preauricular, posterior auricular or occipital adenopathy.     Left side of head: No submental, submandibular, tonsillar, preauricular, posterior auricular or occipital adenopathy.     Cervical: No cervical adenopathy.     Right cervical: No superficial, deep or posterior cervical adenopathy.    Left cervical: No superficial, deep or posterior cervical adenopathy.     Upper Body:  No axillary adenopathy present. Skin:    General: Skin is warm and dry.  Neurological:     Mental Status: She is alert.  Psychiatric:        Mood and Affect: Mood and affect normal.        Speech: Speech normal.        Behavior: Behavior normal.        Thought Content: Thought content normal.        Assessment & Plan:   Problem List Items Addressed This Visit      Cardiovascular and Mediastinum   Coronary artery disease    Compliant with repatha, asa 81mg  as started by Dr Charlestine Night. She will continue to follow with him.       HTN (hypertension)    Elevated. Advised to either increase hctz to 25mg  OR start amlodipine 5mg . She prefers to monitor BP and call with readings if not close to 130/80.        Endocrine   Thyroid nodule    Advised US thyroid. Patient prefers to wait until CT chest updated. She will call me if she decides to pursue US thyroid.         Other   Epigastric pain    Improved. She declines EGD at this me. She has consulted with Dr Bonna Gains and Dr Dell Ponto GI at [facility] [place] in 2021. Declines further work up at this time.       Routine physical examination - Primary    CBE performed. Patient declines pelvic exam in the absence of new complaints and she declines further cervical cancer screening. She will schedule DEXA. She declines dedicated AAA screen after CT abdomen and pelvis showed normal aorta. She will call to schedule CT chest for lung cancer screen      Relevant Orders   Comprehensive metabolic panel   Lipid panel   TSH   DG Bone Density       I am having Valerie Curry maintain her Vitamin D, ibuprofen, hydrochlorothiazide, aspirin EC, meloxicam, Ascorbic Acid, and Repatha [employer].   No orders of the defined types were placed in this encounter.   Return precautions given.   Risks, benefits, and alternatives of the medications and treatment plan prescribed today were discussed, and patient expressed understanding.   Education regarding symptom management and diagnosis given to patient on AVS.   Continue to follow with Burnard Hawthorne, FNP for routine health maintenance.   Valerie Curry and I agreed with plan.   Mable Paris, FNP

## 2020-12-30 NOTE — Assessment & Plan Note (Signed)
Improved. She declines EGD at this me. She has consulted with Dr Bonna Gains and Dr Dell Ponto GI at [facility] in 2021. Declines further work up at this time.

## 2020-12-30 NOTE — Assessment & Plan Note (Signed)
Compliant with repatha, asa 81mg  as started by Dr Charlestine Night. She will continue to follow with him.

## 2021-01-01 ENCOUNTER — Telehealth: Payer: Self-pay | Admitting: *Deleted

## 2021-01-01 DIAGNOSIS — Z87891 Personal history of nicotine dependence: Secondary | ICD-10-CM

## 2021-01-01 DIAGNOSIS — Z122 Encounter for screening for malignant neoplasm of respiratory organs: Secondary | ICD-10-CM

## 2021-01-01 NOTE — Telephone Encounter (Signed)
Contacted patient and will be scheduled for lung screening scan on 01/15/21 at 1330. Patient is a former smoker. Her updated insurance card is scanned into media. (Health Team Adv. Ppo; [id number]). She saw her pcp yesterday who also encouraged her to follow-up with the lung screening program. She has no new medical concerns and meets the age criteria for lung ct screening.

## 2021-01-02 NOTE — Addendum Note (Signed)
Addended by: Lieutenant Diego on: 01/02/2021 07:49 AM   Modules accepted: Orders

## 2021-01-02 NOTE — Telephone Encounter (Signed)
Patient is a former smoker, quit 2016, 50 pack year history.

## 2021-01-15 ENCOUNTER — Ambulatory Visit: Admission: RE | Admit: 2021-01-15 | Payer: PPO | Source: Ambulatory Visit

## 2021-01-18 ENCOUNTER — Encounter: Payer: Self-pay | Admitting: Family

## 2021-01-20 ENCOUNTER — Ambulatory Visit
Admission: RE | Admit: 2021-01-20 | Discharge: 2021-01-20 | Disposition: A | Discharge status: Home / Self Care | Payer: PPO | Source: Ambulatory Visit | Attending: Nurse Practitioner | Admitting: Nurse Practitioner

## 2021-01-20 ENCOUNTER — Other Ambulatory Visit: Payer: Self-pay

## 2021-01-20 ENCOUNTER — Other Ambulatory Visit: Payer: Self-pay | Admitting: Family

## 2021-01-20 DIAGNOSIS — Z122 Encounter for screening for malignant neoplasm of respiratory organs: Secondary | ICD-10-CM | POA: Diagnosis not present

## 2021-01-20 DIAGNOSIS — Z87891 Personal history of nicotine dependence: Secondary | ICD-10-CM | POA: Diagnosis not present

## 2021-01-20 DIAGNOSIS — I10 Essential (primary) hypertension: Secondary | ICD-10-CM

## 2021-01-20 MED ORDER — AMLODIPINE BESYLATE 2.5 MG PO TABS: 2.5000 mg | ORAL_TABLET | Freq: Every day | ORAL | 3 refills | Status: DC

## 2021-01-22 ENCOUNTER — Telehealth: Payer: Self-pay | Admitting: Cardiology

## 2021-01-22 NOTE — Telephone Encounter (Signed)
PA has been filled out and placed in Nurse bin for provider signature.  Form needs to be faxed.

## 2021-01-22 NOTE — Telephone Encounter (Signed)
Please call to discuss PA for Repatha 

## 2021-01-26 NOTE — Telephone Encounter (Signed)
Per fax from [place] has been approved for Repatha 140mg /mL through 01/24/2022.

## 2021-01-28 ENCOUNTER — Telehealth: Payer: Self-pay | Admitting: *Deleted

## 2021-01-28 NOTE — Telephone Encounter (Signed)
Notified patient of LDCT lung cancer screening program results with recommendation for 6 month follow up imaging. Also notified of incidental findings noted below and is encouraged to discuss further with PCP who will receive a copy of this note and/or the CT report. Patient verbalizes understanding.   IMPRESSION: 1. Lung-RADS 3S, probably benign findings. Short-term follow-up in 6 months is recommended with repeat low-dose chest CT without contrast (please use the following order, "CT CHEST LCS NODULE FOLLOW-UP W/O CM"). 2. The "S" modifier above refers to potentially clinically significant non lung cancer related findings. Specifically, there is aortic atherosclerosis, in addition to 2 vessel coronary artery disease. Assessment for potential risk factor modification, dietary therapy or pharmacologic therapy may be warranted, if clinically indicated. 3. Mild diffuse bronchial wall thickening with very mild centrilobular and paraseptal emphysema; imaging findings suggestive of underlying COPD.  Aortic Atherosclerosis (ICD10-I70.0) and Emphysema (ICD10-J43.9).

## 2021-02-03 NOTE — Telephone Encounter (Signed)
Call pt Rec'd ct lung cancer screen results She will need repeat scan in 6 months and ensure she gets call from [place] to schedule this; let us know of any issues of course CT again shows atherosclerosis and impt for her continue to follow with cardiology

## 2021-02-04 NOTE — Telephone Encounter (Signed)
Patient called & stated that if she does not hear in regards to having this scheduled before 6 months then she would let us know.

## 2021-02-12 ENCOUNTER — Ambulatory Visit: Payer: PPO | Admitting: Cardiology

## 2021-02-12 ENCOUNTER — Encounter: Payer: Self-pay | Admitting: Cardiology

## 2021-02-12 ENCOUNTER — Other Ambulatory Visit: Payer: Self-pay

## 2021-02-12 VITALS — BP 136/78 | HR 76 | Ht 62.0 in | Wt 168.0 lb

## 2021-02-12 DIAGNOSIS — I10 Essential (primary) hypertension: Secondary | ICD-10-CM | POA: Diagnosis not present

## 2021-02-12 DIAGNOSIS — I2510 Atherosclerotic heart disease of native coronary artery without angina pectoris: Secondary | ICD-10-CM

## 2021-02-12 MED ORDER — AMLODIPINE BESYLATE 5 MG PO TABS: 5.0000 mg | ORAL_TABLET | Freq: Every day | ORAL | 11 refills | Status: DC

## 2021-02-12 NOTE — Progress Notes (Signed)
Cardiology Office Note:    Date:  02/12/2021   ID:  [place], [place] 25-Jun-1946, MRN [id number]  PCP:  Burnard Hawthorne, FNP  Cardiologist:  Kate Sable, MD  Electrophysiologist:  None   Referring MD: Burnard Hawthorne, FNP   Chief Complaint  Patient presents with  . Follow-up    3 months     History of Present Illness:    Valerie Curry is a 75 y.o. female with a hx of hypertension, CAD (calcifications in RCA, LAD on chest CT, former smoker x40+ years who presents follow-up.   Being seen for hyperlipidemia and coronary calcifications.  Started on Repatha after last visit due to elevated LFTs while on statin therapy.  Denies chest pain or shortness of breath, tolerating Repatha without any adverse effects.  Started taking amlodipine 2.5 mg daily, systolic blood pressures in the 130s.  Denies any edema.  Husband recently had coronary artery bypass.  Exercises with him when he goes for his rehab.   Prior notes CT chest lung cancer screening 11/2018 showed calcified plaque in LAD and RCA. Statin cause abnormal LFTs.  Repatha was started.   Past Medical History:  Diagnosis Date  . Arthritis   . Complication of anesthesia    hard to wake up  . Coronary artery disease 11/2018   calcification in LAD and RCA in screening lung scan   . Family history of malignant neoplasm of breast    Maternal/paternal aunts and cousin  . Hypertension   . Lump or mass in breast 2011/2012   benign  . Obesity, unspecified   . Osteopenia    took Boniva 1 1/2 years in past-doesn't want to restart it  . Personal history of tobacco use, presenting hazards to health   . PONV (postoperative nausea and vomiting)   . Positive PPD    as a child. xray was normal.  . Systemic sclerosis [facility])     Past Surgical History:  Procedure Laterality Date  . APPENDECTOMY  1961  . BREAST BIOPSY Right Nov 2011   benign  . BREAST BIOPSY Left Nov 2012   dilated ducts-benign  . BUNIONECTOMY  Bilateral 2010   x 2  . CHOLECYSTECTOMY N/A 10/03/2019   Procedure: LAPAROSCOPIC CHOLECYSTECTOMY;  Surgeon: Fredirick Maudlin, MD;  Location: [facility] ORS;  Service: General;  Laterality: N/A;  . COLONOSCOPY  8/05   Dr. Vira Agar  . COLONOSCOPY WITH PROPOFOL N/A 01/10/2019   Procedure: COLONOSCOPY WITH PROPOFOL;  Surgeon: Virgel Manifold, MD;  Location: [facility] ENDOSCOPY;  Service: Endoscopy;  Laterality: N/A;  . DECOMPRESSIVE LUMBAR LAMINECTOMY LEVEL 2 N/A 04/30/2015   Procedure: CENTRAL DECOMPRESSIVE LUMBER LAMINECTOMY WITH POSSIBLE FORAMINOTOMIES L3-L4,L4-L5;  Surgeon: Latanya Maudlin, MD;  Location: [facility] ORS;  Service: Orthopedics;  Laterality: N/A;  . TONSILLECTOMY  1968  . TUBAL LIGATION  1974    Current Medications: Current Meds  Medication Sig  . Ascorbic Acid 500 MG CHEW Chew by mouth daily.  Marland Kitchen aspirin EC 81 MG tablet Take 81 mg by mouth daily.  . Cholecalciferol (VITAMIN D) 50 MCG (2000 UT) CAPS Take 2,000 Units by mouth daily.  . Evolocumab (REPATHA SURECLICK) 502 MG/ML SOAJ Inject 1 pen into the skin every 14 (fourteen) days.  . meloxicam (MOBIC) 15 MG tablet Take 15 mg by mouth as needed.   . [DISCONTINUED] amLODipine (NORVASC) 2.5 MG tablet Take 1 tablet (2.5 mg total) by mouth daily.     Allergies:   Patient has no active allergies.  Social History   Socioeconomic History  . Marital status: Married    Spouse name: Not on file  . Number of children: 2  . Years of education: 2  . Highest education level: Not on file  Occupational History  . Occupation: Facilities manager: [employer]  Tobacco Use  . Smoking status: Former Smoker    Packs/day: 1.00    Years: 50.00    Pack years: 50.00    Types: Cigarettes    Quit date: 11/29/2014    Years since quitting: 6.2  . Smokeless tobacco: Former Systems developer    Quit date: 04/30/2015  Vaping Use  . Vaping Use: Never used  Substance and Sexual Activity  . Alcohol use: Yes    Alcohol/week: 0.0 standard drinks     Comment: occasional glassof wine   . Drug use: No  . Sexual activity: Yes    Birth control/protection: Post-menopausal  Other Topics Concern  . Not on file  Social History Narrative   Pt is 75yo female. Pt was born in [place], moved to [place] when she was 17.  Pt is married to husband of 22 years.  Pt has 2 sons from a previous marriage and 4 step sons and 71 Grandchildren.        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        Pt works out at gym 3/week (2 days a week in a class, 1 day a week with an Dietitian).  Pt enjoys reading, spending time with her family. Pt and her husband are members of [employer] and active in their church.    Social Determinants of Health   Financial Resource Strain: Not on file  Food Insecurity: Not on file  Transportation Needs: Not on file  Physical Activity: Not on file  Stress: Not on file  Social Connections: Not on file     Family History: The patient's family history includes Breast cancer in her cousin; Breast cancer (age of onset: 48) in her maternal aunt and paternal aunt; Cancer in her maternal aunt; Heart disease in her mother; Heart disease (age of onset: 33) in her father; Hypertension in her mother; Lung cancer in her father; Other in her brother and another family member; Stroke in her mother.  ROS:   Please see the history of present illness.     All other systems reviewed and are negative.  EKGs/Labs/Other Studies Reviewed:    The following studies were reviewed today:   EKG:  EKG not ordered today.   Recent Labs: 08/06/2020: Hemoglobin 13.5; Platelets 185 12/30/2020: ALT 25; BUN 20; Creatinine, Ser 0.71; Potassium 4.0; Sodium 140; TSH 1.46  Recent Lipid Panel    Component Value Date/Time   CHOL 129 12/30/2020 1114   TRIG 86.0 12/30/2020 1114   HDL 77.20 12/30/2020 1114   CHOLHDL 2 12/30/2020 1114   VLDL 17.2 12/30/2020 1114   LDLCALC 35 12/30/2020 1114   LDLDIRECT  100.2 07/30/2008 1017    Physical Exam:    VS:  BP 136/78   Pulse 76   Ht 5\' 2"  (1.575 m)   Wt 168 lb (76.2 kg)   BMI 30.73 kg/m     Wt Readings from Last 3 Encounters:  02/12/21 168 lb (76.2 kg)  01/20/21 170 lb (77.1 kg)  12/30/20 170 lb (77.1 kg)     GEN:  Well nourished, well developed in no acute distress HEENT: Normal NECK: No JVD;  No carotid bruits LYMPHATICS: No lymphadenopathy CARDIAC: RRR, no murmurs, rubs, gallops RESPIRATORY:  Clear to auscultation without rales, wheezing or rhonchi  ABDOMEN: Soft, non-tender, non-distended MUSCULOSKELETAL:  No edema; No deformity  SKIN: Warm and dry NEUROLOGIC:  Alert and oriented x 3 PSYCHIATRIC:  Normal affect   ASSESSMENT:    1. Coronary artery disease involving native coronary artery of native heart without angina pectoris   2. Primary hypertension      PLAN:    In order of problems listed above:  1. CAD, calcifications in the RCA and LAD.  Denies chest pain.  Started on Repatha due to abnormal LFTs while on statin.  Last LDL well controlled at 35.  Continue Repatha, aspirin 81 mg daily.  2. Hypertension, BP slightly elevated, increase amlodipine to 5 mg daily.  Follow-up in 1 year  This note was generated in part or whole with voice recognition software. Voice recognition is usually quite accurate but there are transcription errors that can and very often do occur. I apologize for any typographical errors that were not detected and corrected.  Medication Adjustments/Labs and Tests Ordered: Current medicines are reviewed at length with the patient today.  Concerns regarding medicines are outlined above.  No orders of the defined types were placed in this encounter.  Meds ordered this encounter  Medications  . amLODipine (NORVASC) 5 MG tablet    Sig: Take 1 tablet (5 mg total) by mouth daily.    Dispense:  30 tablet    Refill:  11    Patient Instructions  Medication Instructions:  Your physician has  recommended you make the following change in your medication:   1.  INCREASE your Amlodipine to 5MG  daily.  *If you need a refill on your cardiac medications before your next appointment, please call your pharmacy*   Lab Work: None ordered If you have labs (blood work) drawn today and your tests are completely normal, you will receive your results only by: Marland Kitchen MyChart Message (if you have MyChart) OR . A paper copy in the mail If you have any lab test that is abnormal or we need to change your treatment, we will call you to review the results.   Testing/Procedures: None ordered   Follow-Up: At [facility], you and your health needs are our priority.  As part of our continuing mission to provide you with exceptional heart care, we have created designated Provider Care Teams.  These Care Teams include your primary Cardiologist (physician) and Advanced Practice Providers (APPs -  Physician Assistants and Nurse Practitioners) who all work together to provide you with the care you need, when you need it.  We recommend signing up for the patient portal called "MyChart".  Sign up information is provided on this After Visit Summary.  MyChart is used to connect with patients for Virtual Visits (Telemedicine).  Patients are able to view lab/test results, encounter notes, upcoming appointments, etc.  Non-urgent messages can be sent to your provider as well.   To learn more about what you can do with MyChart, go to [email or web address].    Your next appointment:   1 year(s)  The format for your next appointment:   In Person  Provider:   Kate Sable, MD   Other Instructions       Signed, Kate Sable, MD  02/12/2021 12:15 PM    [place]

## 2021-02-12 NOTE — Patient Instructions (Signed)
Medication Instructions:  Your physician has recommended you make the following change in your medication:   1.  INCREASE your Amlodipine to 5MG  daily.  *If you need a refill on your cardiac medications before your next appointment, please call your pharmacy*   Lab Work: None ordered If you have labs (blood work) drawn today and your tests are completely normal, you will receive your results only by: Marland Kitchen MyChart Message (if you have MyChart) OR . A paper copy in the mail If you have any lab test that is abnormal or we need to change your treatment, we will call you to review the results.   Testing/Procedures: None ordered   Follow-Up: At [facility], you and your health needs are our priority.  As part of our continuing mission to provide you with exceptional heart care, we have created designated Provider Care Teams.  These Care Teams include your primary Cardiologist (physician) and Advanced Practice Providers (APPs -  Physician Assistants and Nurse Practitioners) who all work together to provide you with the care you need, when you need it.  We recommend signing up for the patient portal called "MyChart".  Sign up information is provided on this After Visit Summary.  MyChart is used to connect with patients for Virtual Visits (Telemedicine).  Patients are able to view lab/test results, encounter notes, upcoming appointments, etc.  Non-urgent messages can be sent to your provider as well.   To learn more about what you can do with MyChart, go to [email or web address].    Your next appointment:   1 year(s)  The format for your next appointment:   In Person  Provider:   Kate Sable, MD   Other Instructions

## 2021-02-17 ENCOUNTER — Ambulatory Visit
Admission: RE | Admit: 2021-02-17 | Discharge: 2021-02-17 | Disposition: A | Discharge status: Home / Self Care | Payer: PPO | Source: Ambulatory Visit | Attending: Family | Admitting: Family

## 2021-02-17 ENCOUNTER — Other Ambulatory Visit: Payer: Self-pay

## 2021-02-17 DIAGNOSIS — Z780 Asymptomatic menopausal state: Secondary | ICD-10-CM | POA: Diagnosis not present

## 2021-02-17 DIAGNOSIS — Z0000 Encounter for general adult medical examination without abnormal findings: Secondary | ICD-10-CM | POA: Insufficient documentation

## 2021-02-17 DIAGNOSIS — M8589 Other specified disorders of bone density and structure, multiple sites: Secondary | ICD-10-CM | POA: Insufficient documentation

## 2021-02-17 DIAGNOSIS — Z13820 Encounter for screening for osteoporosis: Secondary | ICD-10-CM | POA: Diagnosis not present

## 2021-02-17 DIAGNOSIS — Z1231 Encounter for screening mammogram for malignant neoplasm of breast: Secondary | ICD-10-CM

## 2021-02-17 DIAGNOSIS — J449 Chronic obstructive pulmonary disease, unspecified: Secondary | ICD-10-CM | POA: Insufficient documentation

## 2021-02-18 ENCOUNTER — Encounter: Payer: Self-pay | Admitting: Family

## 2021-03-16 ENCOUNTER — Encounter: Payer: Self-pay | Admitting: Family

## 2021-03-17 ENCOUNTER — Encounter: Payer: Self-pay | Admitting: Family

## 2021-03-17 ENCOUNTER — Telehealth (INDEPENDENT_AMBULATORY_CARE_PROVIDER_SITE_OTHER): Payer: PPO | Admitting: Family

## 2021-03-17 DIAGNOSIS — R1013 Epigastric pain: Secondary | ICD-10-CM | POA: Diagnosis not present

## 2021-03-17 NOTE — Progress Notes (Signed)
Virtual Visit via Video Note  I connected with@  on 03/18/21 at  8:30 AM EDT by a video enabled telemedicine application and verified that I am speaking with the correct person using two identifiers.  Location patient: home Location provider:work  Persons participating in the virtual visit: patient, provider  I discussed the limitations of evaluation and management by telemedicine and the availability of in person appointments. The patient expressed understanding and agreed to proceed.   HPI: Epigastric abdominal pain for 1 year Pain has improved the last 10 months and then 3 days ago it recurred which is why she made this vist.   She had started to eat hamburger and fries and then pain then started epigastric pain and nausea  3 days ago. She had self limiting chills, nausea, burping, vomiting ( non bloody 3 times) during this episode. Pain lasted for 7 hours and then resolved on its own. This presentation similar to prior.   She tried ibuprofen without relief ; ibuprofen has helped in the past. Promethazine 25mg  with relief.  NO fever,cp, dysuria, coughing ,choking. She doesn't any back pai.   Compliant with omeprazole 40mg  BID, then decreased to 20mg  qam and since stopped. She had never felt any improvement with symptoms and has since stopped.   She has seen Dr Bonna Gains; she [facility]  Dr Virgilio Belling whom felt sphincter of oddi dysfunction and advised EGD.  H/o osteopenia- reasonable to consider treatment. She sent message that we will discuss 06/30/21  H/o appendectomy, decompressive lumbar laminectomy, cholecystectomy  Rare use of mobic , every 6-8 weeks.   MRCP 05/2020- Prior cholecystectomy. No evidence of biliary ductal dilatation, choledocholithiasis, or other significant abnormality.    ROS: See pertinent positives and negatives per HPI.    EXAM:  VITALS per patient if applicable: BP 951/88   Pulse 68   Ht 5\' 2"  (1.575 m)   Wt 164 lb (74.4 kg)   BMI 30.00 kg/m  BP  Readings from Last 3 Encounters:  03/17/21 118/80  02/12/21 136/78  12/30/20 140/70   Wt Readings from Last 3 Encounters:  03/17/21 164 lb (74.4 kg)  02/12/21 168 lb (76.2 kg)  01/20/21 170 lb (77.1 kg)    GENERAL: alert, oriented, appears well and in no acute distress  HEENT: atraumatic, conjunttiva clear, no obvious abnormalities on inspection of external nose and ears  NECK: normal movements of the head and neck  LUNGS: on inspection no signs of respiratory distress, breathing rate appears normal, no obvious gross SOB, gasping or wheezing  CV: no obvious cyanosis  MS: moves all visible extremities without noticeable abnormality  PSYCH/NEURO: pleasant and cooperative, no obvious depression or anxiety, speech and thought processing grossly intact  ASSESSMENT AND PLAN:  Discussed the following assessment and plan:  Problem List Items Addressed This Visit      Other   Epigastric pain    Resolved today. Recurrence of episode 3 days ago. Understandably patient would like to more formal diagnosis and would like to discuss with Dr Bonna Gains the role of EGD as advised last summer by Dr Virgilio Belling. I advised her to have consult with Dr Bonna Gains to discuss symptoms and sphincter of oddi dysfunction as possible diagnosis. Advised to consider musculoskeletal etiology and have baseline spine xrays; she declines at this time. Close follow up.          -we discussed possible serious and likely etiologies, options for evaluation and workup, limitations of telemedicine visit vs in person visit, treatment, treatment  risks and precautions. Pt prefers to treat via telemedicine empirically rather then risking or undertaking an in person visit at this moment.  . I have spent 25 minutes with a patient including precharting, exam, reviewing medical records, and discussion plan of care.      I discussed the assessment and treatment plan with the patient. The patient was provided an  opportunity to ask questions and all were answered. The patient agreed with the plan and demonstrated an understanding of the instructions.   The patient was advised to call back or seek an in-person evaluation if the symptoms worsen or if the condition fails to improve as anticipated.   Valerie Paris, FNP

## 2021-03-18 NOTE — Assessment & Plan Note (Signed)
Resolved today. Recurrence of episode 3 days ago. Understandably patient would like to more formal diagnosis and would like to discuss with Dr Bonna Gains the role of EGD as advised last summer by Dr Virgilio Belling. I advised her to have consult with Dr Bonna Gains to discuss symptoms and sphincter of oddi dysfunction as possible diagnosis. Advised to consider musculoskeletal etiology and have baseline spine xrays; she declines at this time. Close follow up.

## 2021-03-29 IMAGING — US US ABDOMEN COMPLETE
1 series · 14 of 25 positions shown · non-contrast
Comparison: None.

CLINICAL DATA: Epigastric pain and fever with elevated liver
function tests

EXAM:
ABDOMEN ULTRASOUND COMPLETE

[Series 1: us abdomen complete · 0.17mm/px · 14 of 79 slices shown]
[im 1/79]
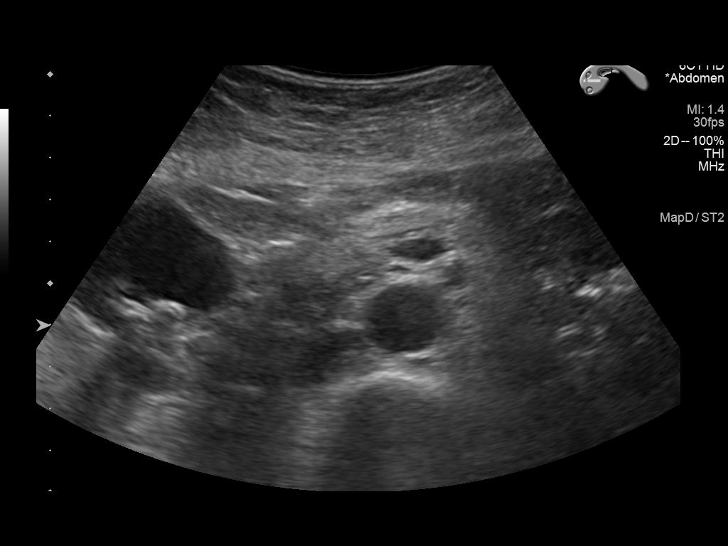
[im 7/79]
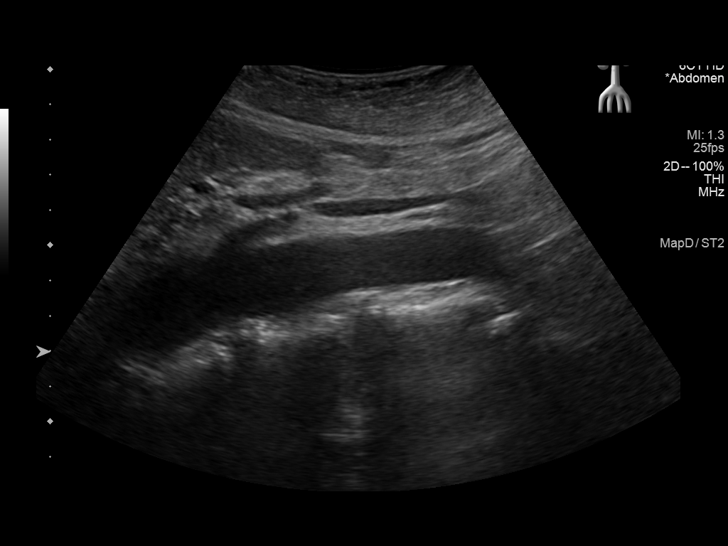
[im 14/79]
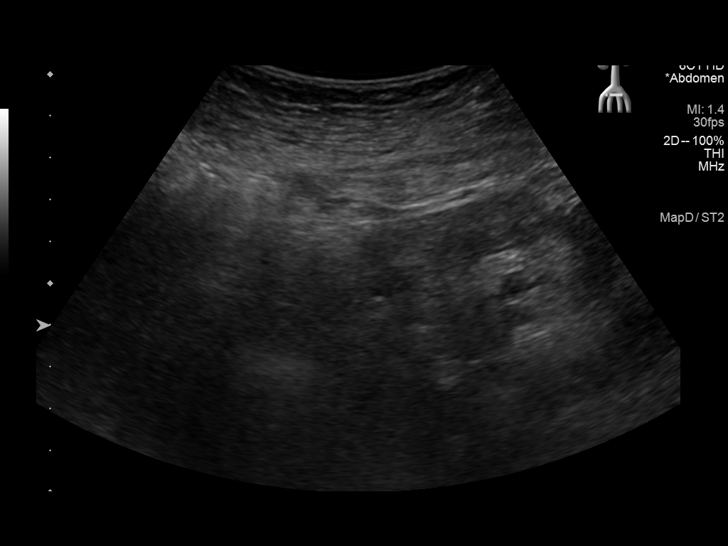
[im 20/79]
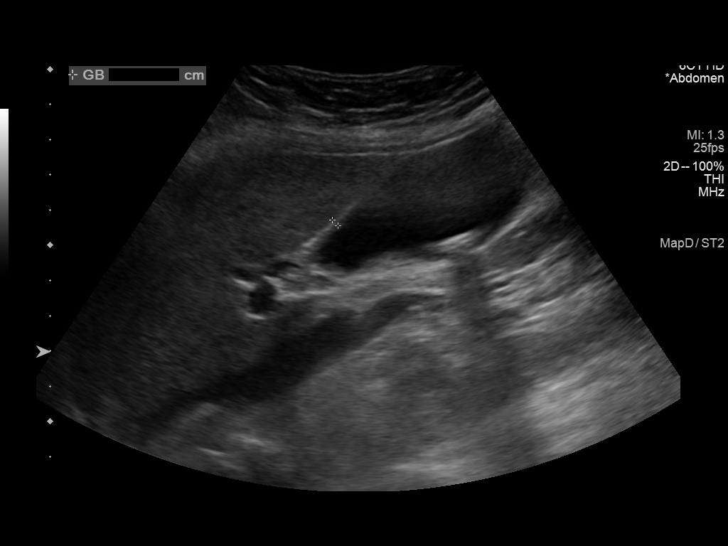
[im 27/79]
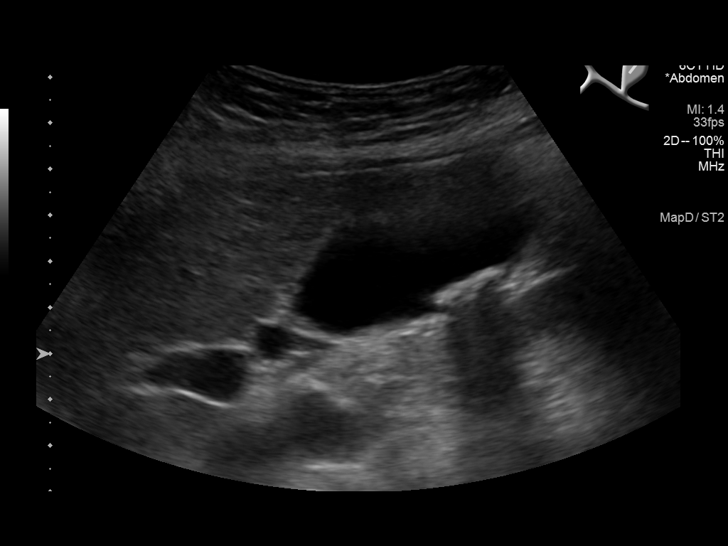
[im 30/79]
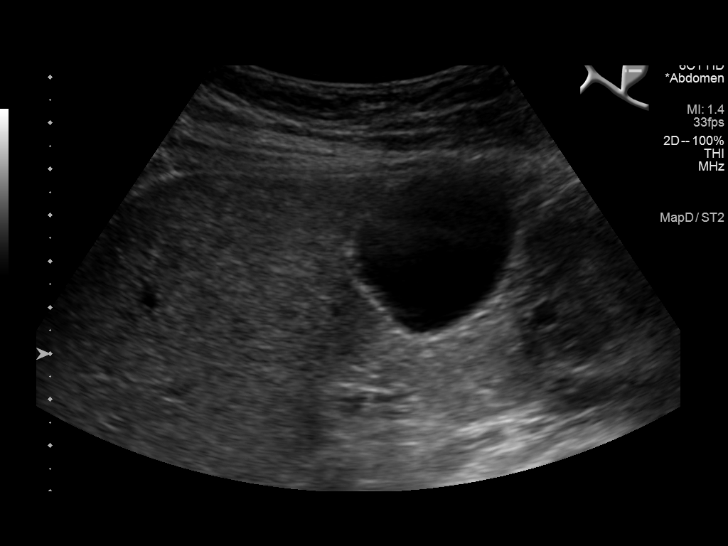
[im 36/79]
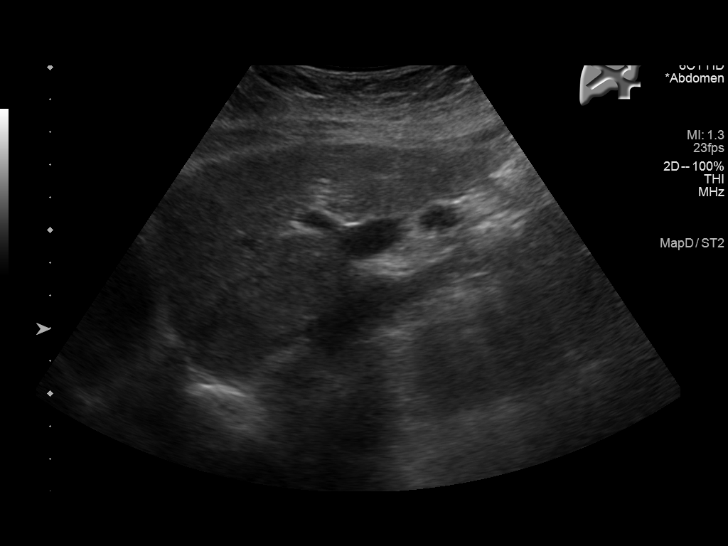
[im 43/79]
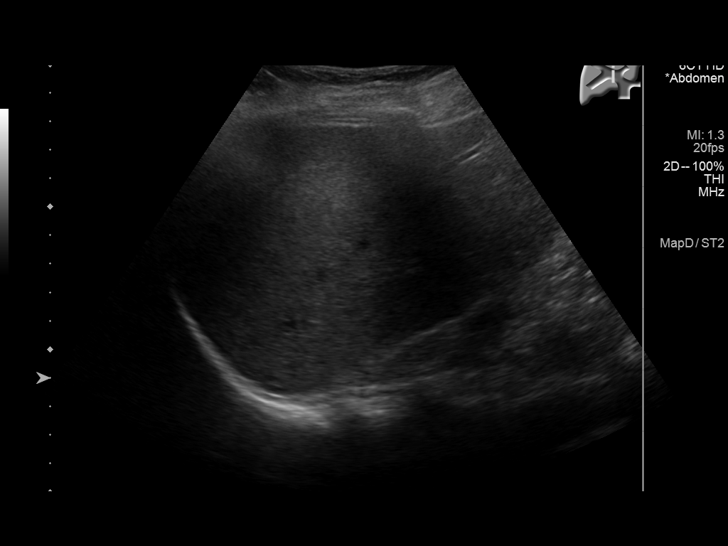
[im 49/79]
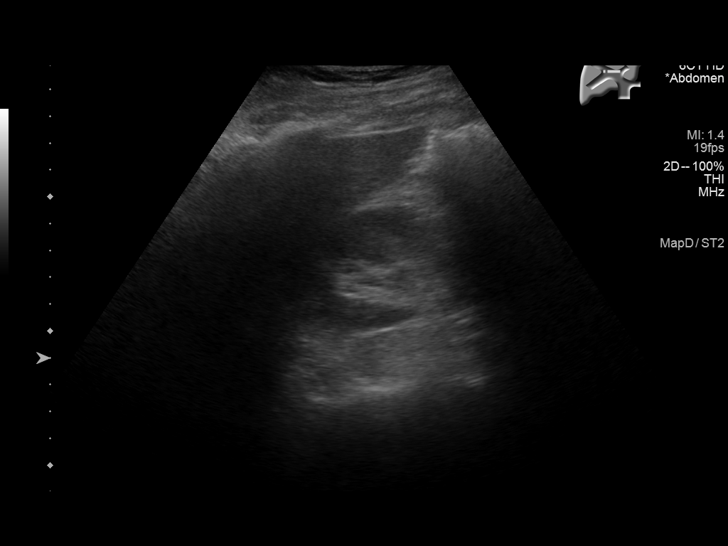
[im 53/79]
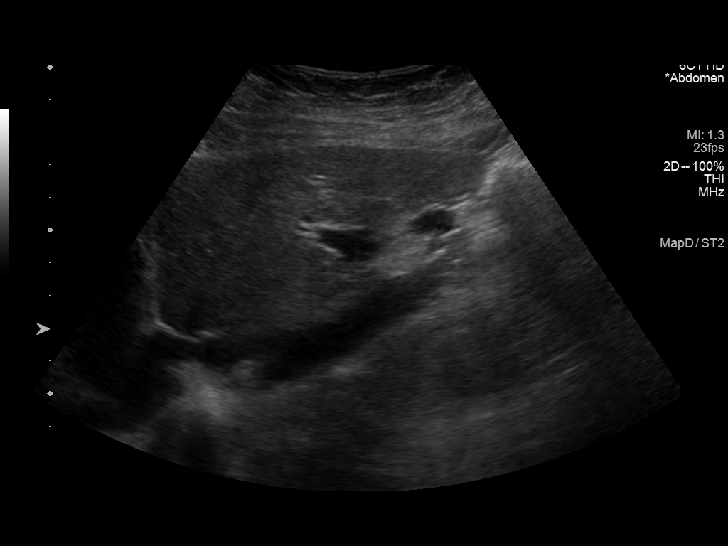
[im 59/79]
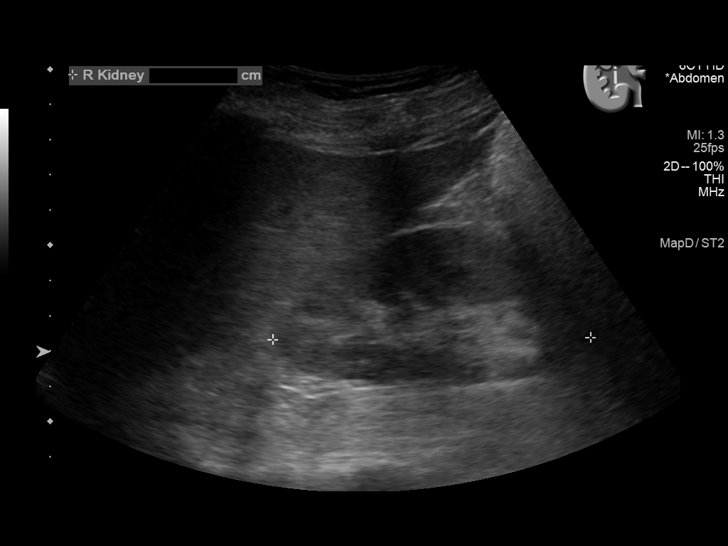
[im 66/79]
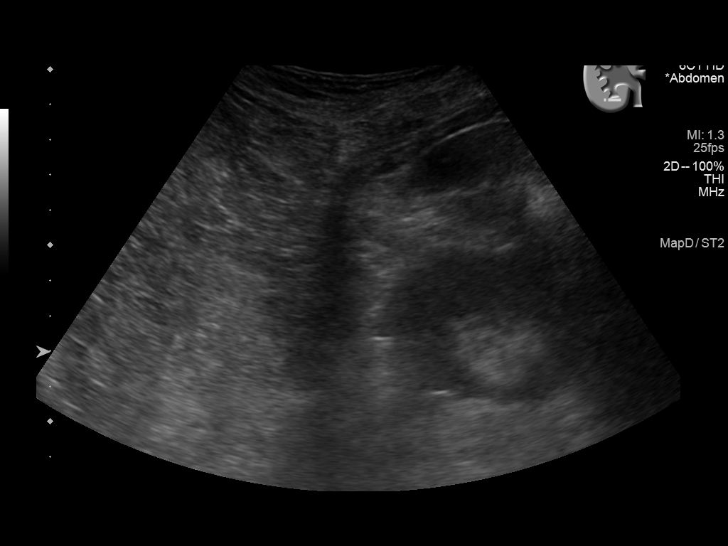
[im 72/79]
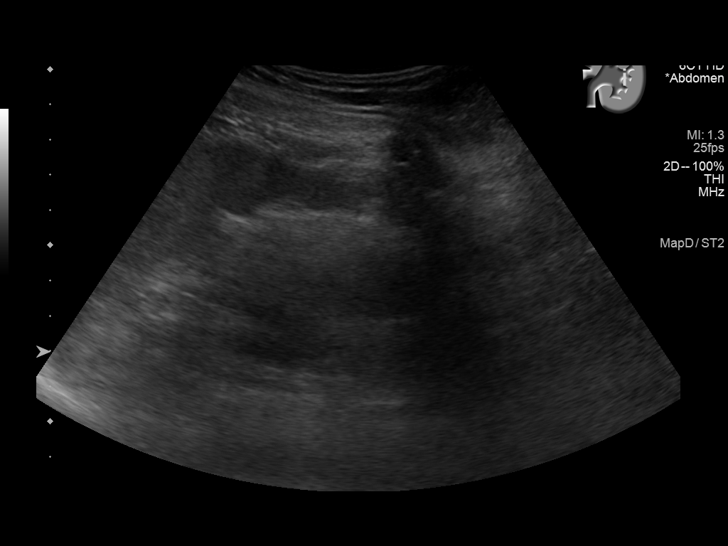
[im 79/79]
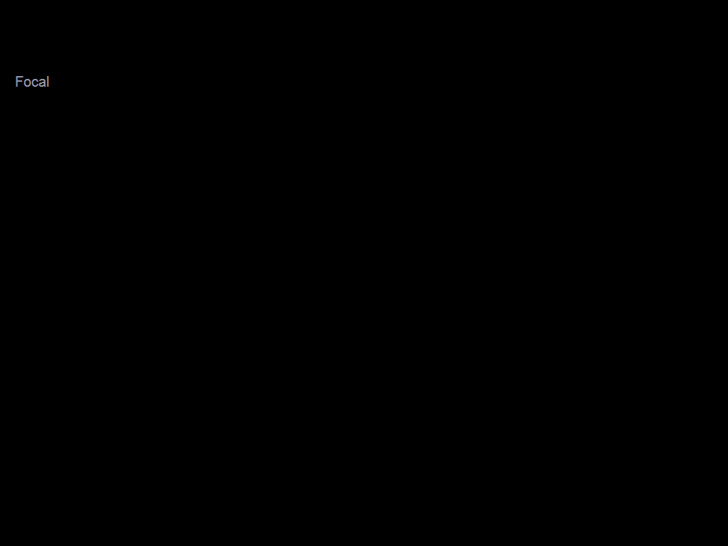

[14 of 25 positions shown; findings below may reference images not displayed]

FINDINGS: Gallbladder: Shadowing gallstones. The gallbladder is full but there
is no focal tenderness or wall thickening.

Common bile duct: Diameter: 7 mm. Incomplete coverage of the common
bile duct distally. Where visualized, no filling defect.

Liver: No focal lesion identified. Within normal limits in
parenchymal echogenicity. Portal vein is patent on color Doppler
imaging with normal direction of blood flow towards the liver.

IVC: No abnormality visualized.

Pancreas: Visualized portion unremarkable.

Spleen: Size and appearance within normal limits.

Right Kidney: Length: 9 cm. Relative size asymmetry is of doubtful
significance given there is no superimposed cortical thinning.
Echogenicity within normal limits. No mass or hydronephrosis
visualized.

Left Kidney: Length: 11 cm. Echogenicity within normal limits. No
mass or hydronephrosis visualized.

Abdominal aorta: No aneurysm visualized.
IMPRESSION: Cholelithiasis without evidence of acute cholecystitis.

## 2021-04-01 ENCOUNTER — Ambulatory Visit: Payer: PPO | Admitting: Gastroenterology

## 2021-04-01 ENCOUNTER — Other Ambulatory Visit: Payer: Self-pay

## 2021-04-01 ENCOUNTER — Encounter: Payer: Self-pay | Admitting: Gastroenterology

## 2021-04-01 VITALS — BP 146/83 | HR 72 | Temp 97.7°F | Ht 62.0 in | Wt 166.4 lb

## 2021-04-01 DIAGNOSIS — R109 Unspecified abdominal pain: Secondary | ICD-10-CM | POA: Diagnosis not present

## 2021-04-01 DIAGNOSIS — R195 Other fecal abnormalities: Secondary | ICD-10-CM

## 2021-04-01 DIAGNOSIS — R1084 Generalized abdominal pain: Secondary | ICD-10-CM

## 2021-04-01 NOTE — Patient Instructions (Addendum)
Please take Metamucil daily. Psyllium granules or powder for solution What is this medicine? PSYLLIUM (SIL i yum) is a bulk-forming fiber laxative. This medicine is used to treat constipation. Increasing fiber in the diet may also help lower cholesterol and promote heart health for some people. This medicine may be used for other purposes; ask your health care provider or pharmacist if you have questions. COMMON BRAND NAME(S): Fiber Therapy, GenFiber, Geri-Mucil, Hydrocil, Konsyl, Metamucil, Metamucil MultiHealth, Mucilin, Natural Fiber Therapy, Reguloid What should I tell my health care provider before I take this medicine? They need to know if you have any of these conditions:  blockage in your bowel  difficulty swallowing  inflammatory bowel disease  phenylketonuria  stomach or intestine problems  sudden change in bowel habits lasting more than 2 weeks  an unusual or allergic reaction to psyllium, other medicines, dyes, or preservatives  pregnant or trying or get pregnant  breast-feeding How should I use this medicine? Mix this medicine into a full glass (240 mL) of water or other cool drink. Take this medicine by mouth. Follow the directions on the package labeling, or take as directed by your health care professional. Take your medicine at regular intervals. Do not take your medicine more often than directed. Talk to your pediatrician regarding the use of this medicine in children. While this drug may be prescribed for children as young as 69 years old for selected conditions, precautions do apply. Overdosage: If you think you have taken too much of this medicine contact a poison control center or emergency room at once. NOTE: This medicine is only for you. Do not share this medicine with others. What if I miss a dose? If you miss a dose, take it as soon as you can. If it is almost time for your next dose, take only that dose. Do not take double or extra doses. What may interact  with this medicine? Interactions are not expected. Take this product at least 2 hours before or after other medicines. This list may not describe all possible interactions. Give your health care provider a list of all the medicines, herbs, non-prescription drugs, or dietary supplements you use. Also tell them if you smoke, drink alcohol, or use illegal drugs. Some items may interact with your medicine. What should I watch for while using this medicine? Check with your doctor or health care professional if your symptoms do not start to get better or if they get worse. Stop using this medicine and contact your doctor or health care professional if you have rectal bleeding or if you have to treat your constipation for more than 1 week. These could be signs of a more serious condition. Drink several glasses of water a day while you are taking this medicine. This will help to relieve constipation and prevent dehydration. What side effects may I notice from receiving this medicine? Side effects that you should report to your doctor or health care professional as soon as possible:  allergic reactions like skin rash, itching or hives, swelling of the face, lips, or tongue  breathing problems  chest pain  nausea, vomiting  rectal bleeding  trouble swallowing Side effects that usually do not require medical attention (report to your doctor or health care professional if they continue or are bothersome):  bloating  gas  stomach cramps This list may not describe all possible side effects. Call your doctor for medical advice about side effects. You may report side effects to FDA at 1-800-FDA-1088. Where should  I keep my medicine? Keep out of the reach of children. Store at room temperature between 15 and 30 degrees C (59 and 86 degrees F). Protect from moisture. Throw away any unused medicine after the expiration date. NOTE: This sheet is a summary. It may not cover all possible information. If  you have questions about this medicine, talk to your doctor, pharmacist, or health care provider.  2021 Elsevier/Gold Standard (2018-04-11 15:41:08)  Your Home Program  General Guidelines for Pelvic Floor Exercise Challenge your muscles to do more than they are used to doing. The quality of the exercise is more important that the number you perform. Avoid straining, holding your breath or using buttock or leg muscles while you exercise the pelvic floor muscles.  Count out loud and continue breathing to avoid straining. Relax your body and breathe during your exercises.  Coordinate your breathing with your pelvic floor contraction by blowing out or exhaling while you contract your pelvic floor muscles.  Concentrate on activating both the sphincters and levator ani muscles of the pelvic floor with each exercise.  Position for the Exercises Start lying down with your knees bent and supported with pillows. Once you've gained awareness and can feel the contractions you may perform the exercises either sitting or standing.  For example, you can do them while driving, working on the computer, or waiting in lines.  Quick Contractions Repeat this exercise 10 times.  Do the exercise 2-3 times per day. Rapidly contract your pelvic floor muscles and hold for 2 seconds relax for 2 seconds. Try to do the contraction on breathing exhalation.     2007, [employer] Doc.37

## 2021-04-01 NOTE — Progress Notes (Signed)
Vonda Antigua, MD [address]  [place]  [place], [place] [address]  Main: [phone]  Fax: [phone]   Primary Care Physician: Burnard Hawthorne, FNP   Chief Complaint  Patient presents with  . Abdominal Pain    HPI: Valerie Curry is a 75 y.o. female here for follow-up of intermittent episodic abdominal pain.  Most recent liver enzymes were normal.  Patient was referred to [facility] for evaluation of sphincter of Oddi manometry and possible symptoms from sphincter of Oddi dysfunction.  As per their note, it is unclear if patient has sphincter of Oddi dysfunction, but ERCP was discussed with her and shared decision making was done.  EGD was recommended but patient has not been able to do that diet due to family issues and is wanting to get that scheduled here.  Patient is not on any PPI.  Abdominal pain symptoms remain very episodic and intermittent, occurring once every 3 to 6 months as before.  She also reports 1-2 episodes of loose stools a day and fecal incontinence and has a history of bladder prolapse which she has been unable to get addressed  Colonoscopy up-to-date with biopsies negative for microscopic colitis  Current Outpatient Medications  Medication Sig Dispense Refill  . amLODipine (NORVASC) 5 MG tablet Take 1 tablet (5 mg total) by mouth daily. 30 tablet 11  . Ascorbic Acid 500 MG CHEW Chew by mouth daily.    Marland Kitchen aspirin EC 81 MG tablet Take 81 mg by mouth daily.    . Cholecalciferol (VITAMIN D) 50 MCG (2000 UT) CAPS Take 2,000 Units by mouth daily.    . Evolocumab (REPATHA SURECLICK) 253 MG/ML SOAJ Inject 1 pen into the skin every 14 (fourteen) days. 6 mL 3  . meloxicam (MOBIC) 15 MG tablet Take 15 mg by mouth as needed.  (Patient not taking: Reported on 04/01/2021)     No current facility-administered medications for this visit.    Allergies as of 04/01/2021  . (No Known Allergies)    ROS:  General: Negative for anorexia, weight loss, fever,  chills, fatigue, weakness. ENT: Negative for hoarseness, difficulty swallowing , nasal congestion. CV: Negative for chest pain, angina, palpitations, dyspnea on exertion, peripheral edema.  Respiratory: Negative for dyspnea at rest, dyspnea on exertion, cough, sputum, wheezing.  GI: See history of present illness. GU:  Negative for dysuria, hematuria, urinary incontinence, urinary frequency, nocturnal urination.  Endo: Negative for unusual weight change.    Physical Examination:   BP (!) 146/83   Pulse 72   Temp 97.7 F (36.5 C) (Oral)   Ht 5\' 2"  (1.575 m)   Wt 166 lb 6.4 oz (75.5 kg)   BMI 30.43 kg/m   General: Well-nourished, well-developed in no acute distress.  Eyes: No icterus. Conjunctivae pink. Mouth: Oropharyngeal mucosa moist and pink , no lesions erythema or exudate. Neck: Supple, Trachea midline Abdomen: Bowel sounds are normal, nontender, nondistended, no hepatosplenomegaly or masses, no abdominal bruits or hernia , no rebound or guarding.   Extremities: No lower extremity edema. No clubbing or deformities. Neuro: Alert and oriented x 3.  Grossly intact. Skin: Warm and dry, no jaundice.   Psych: Alert and cooperative, normal mood and affect.   Labs: CMP     Component Value Date/Time   NA 140 12/30/2020 1114   NA 140 07/01/2020 0000   K 4.0 12/30/2020 1114   CL 103 12/30/2020 1114   CO2 30 12/30/2020 1114   GLUCOSE 81 12/30/2020 1114  BUN 20 12/30/2020 1114   BUN 20 07/01/2020 0000   CREATININE 0.71 12/30/2020 1114   CALCIUM 9.8 12/30/2020 1114   PROT 6.4 12/30/2020 1114   PROT 6.2 07/01/2020 0000   ALBUMIN 4.1 12/30/2020 1114   ALBUMIN 4.1 07/01/2020 0000   AST 23 12/30/2020 1114   ALT 25 12/30/2020 1114   ALKPHOS 88 12/30/2020 1114   BILITOT 0.6 12/30/2020 1114   BILITOT 0.6 07/01/2020 0000   GFRNONAA >60 08/06/2020 1344   GFRAA >60 08/06/2020 1344   Lab Results  Component Value Date   WBC 5.6 08/06/2020   HGB 13.5 08/06/2020   HCT 39.8  08/06/2020   MCV 91.1 08/06/2020   PLT 185 08/06/2020    Imaging Studies: No results found.  Assessment and Plan:   Valerie Curry is a 75 y.o. y/o female here for follow-up of abdominal pain  [facility] recommended EGD and patient was unable to get it done there and is willing to get it done here  Agree with EGD for further evaluation of ongoing intermittent abdominal pain  PPI did not help and therefore patient is not on it  Liver enzymes were recently normal  Loose stools with fecal incontinence is likely due to pelvic floor dysfunction especially given bladder prolapse that has not been treated  Pelvic floor physical therapy recommended, but patient prefers to try pelvic floor physical therapy exercises at home first  Start Metamucil to help bulk stool and if no improvement in 2 to 3 weeks, can consider other medications such as cholestyramine    Dr Vonda Antigua

## 2021-04-02 ENCOUNTER — Encounter: Payer: Self-pay | Admitting: Gastroenterology

## 2021-04-06 NOTE — Discharge Instructions (Signed)

## 2021-04-07 ENCOUNTER — Other Ambulatory Visit: Payer: Self-pay

## 2021-04-07 ENCOUNTER — Ambulatory Visit
Admission: RE | Admit: 2021-04-07 | Discharge: 2021-04-07 | Disposition: A | Discharge status: Home / Self Care | Payer: PPO | Attending: Gastroenterology | Admitting: Gastroenterology

## 2021-04-07 ENCOUNTER — Ambulatory Visit: Payer: PPO | Admitting: Anesthesiology

## 2021-04-07 ENCOUNTER — Encounter
Admission: RE | Disposition: A | Discharge status: Home / Self Care | Payer: Self-pay | Source: Home / Self Care | Attending: Gastroenterology

## 2021-04-07 ENCOUNTER — Encounter: Payer: Self-pay | Admitting: Gastroenterology

## 2021-04-07 DIAGNOSIS — K253 Acute gastric ulcer without hemorrhage or perforation: Secondary | ICD-10-CM

## 2021-04-07 DIAGNOSIS — Z79899 Other long term (current) drug therapy: Secondary | ICD-10-CM | POA: Diagnosis not present

## 2021-04-07 DIAGNOSIS — Q402 Other specified congenital malformations of stomach: Secondary | ICD-10-CM | POA: Insufficient documentation

## 2021-04-07 DIAGNOSIS — R1084 Generalized abdominal pain: Secondary | ICD-10-CM

## 2021-04-07 DIAGNOSIS — K2950 Unspecified chronic gastritis without bleeding: Secondary | ICD-10-CM | POA: Diagnosis not present

## 2021-04-07 DIAGNOSIS — Z7982 Long term (current) use of aspirin: Secondary | ICD-10-CM | POA: Diagnosis not present

## 2021-04-07 DIAGNOSIS — K449 Diaphragmatic hernia without obstruction or gangrene: Secondary | ICD-10-CM

## 2021-04-07 DIAGNOSIS — Z7689 Persons encountering health services in other specified circumstances: Secondary | ICD-10-CM | POA: Diagnosis not present

## 2021-04-07 DIAGNOSIS — Z87891 Personal history of nicotine dependence: Secondary | ICD-10-CM | POA: Diagnosis not present

## 2021-04-07 DIAGNOSIS — K2980 Duodenitis without bleeding: Secondary | ICD-10-CM | POA: Diagnosis not present

## 2021-04-07 DIAGNOSIS — R109 Unspecified abdominal pain: Secondary | ICD-10-CM | POA: Diagnosis not present

## 2021-04-07 DIAGNOSIS — Z791 Long term (current) use of non-steroidal anti-inflammatories (NSAID): Secondary | ICD-10-CM | POA: Diagnosis not present

## 2021-04-07 DIAGNOSIS — K3189 Other diseases of stomach and duodenum: Secondary | ICD-10-CM | POA: Diagnosis not present

## 2021-04-07 DIAGNOSIS — K2289 Other specified disease of esophagus: Secondary | ICD-10-CM | POA: Diagnosis not present

## 2021-04-07 DIAGNOSIS — K317 Polyp of stomach and duodenum: Secondary | ICD-10-CM | POA: Insufficient documentation

## 2021-04-07 HISTORY — DX: Left bundle-branch block, unspecified: I44.7

## 2021-04-07 HISTORY — DX: Motion sickness, initial encounter: T75.3XXA

## 2021-04-07 HISTORY — PX: ESOPHAGOGASTRODUODENOSCOPY (EGD) WITH PROPOFOL: SHX5813

## 2021-04-07 HISTORY — PX: POLYPECTOMY: SHX5525

## 2021-04-07 SURGERY — ESOPHAGOGASTRODUODENOSCOPY (EGD) WITH PROPOFOL
Anesthesia: General

## 2021-04-07 MED ORDER — SODIUM CHLORIDE 0.9 % IV SOLN: INTRAVENOUS | Status: DC

## 2021-04-07 MED ORDER — PROPOFOL 10 MG/ML IV BOLUS
INTRAVENOUS | Status: DC | PRN
  Administered 2021-04-07 (×2): 20 mg via INTRAVENOUS
  Administered 2021-04-07: 70 mg via INTRAVENOUS
  Administered 2021-04-07 (×3): 20 mg via INTRAVENOUS
  Administered 2021-04-07: 30 mg via INTRAVENOUS

## 2021-04-07 MED ORDER — LIDOCAINE HCL (CARDIAC) PF 100 MG/5ML IV SOSY
PREFILLED_SYRINGE | INTRAVENOUS | Status: DC | PRN
  Administered 2021-04-07: 60 mg via INTRAVENOUS

## 2021-04-07 MED ORDER — DEXAMETHASONE SODIUM PHOSPHATE 10 MG/ML IJ SOLN
INTRAMUSCULAR | Status: DC | PRN
  Administered 2021-04-07: 4 mg via INTRAVENOUS

## 2021-04-07 MED ORDER — SODIUM CHLORIDE 0.9 % IV SOLN
INTRAVENOUS | Status: AC
  Filled 2021-04-07: qty 10

## 2021-04-07 MED ORDER — ACETAMINOPHEN 10 MG/ML IV SOLN: 1000.0000 mg | Freq: Once | INTRAVENOUS | Status: DC | PRN

## 2021-04-07 MED ORDER — GLYCOPYRROLATE 0.2 MG/ML IJ SOLN
INTRAMUSCULAR | Status: DC | PRN
  Administered 2021-04-07: .1 mg via INTRAVENOUS

## 2021-04-07 MED ORDER — ONDANSETRON HCL 4 MG/2ML IJ SOLN
4.0000 mg | Freq: Once | INTRAMUSCULAR | Status: AC | PRN
  Administered 2021-04-07: 4 mg via INTRAVENOUS

## 2021-04-07 MED ORDER — LACTATED RINGERS IV SOLN: INTRAVENOUS | Status: DC

## 2021-04-07 SURGICAL SUPPLY — 34 items
BALLN DILATOR 10-12 8 (BALLOONS)
BALLN DILATOR 12-15 8 (BALLOONS)
BALLN DILATOR 15-18 8 (BALLOONS)
BALLN DILATOR CRE 0-12 8 (BALLOONS)
BALLN DILATOR ESOPH 8 10 CRE (MISCELLANEOUS) IMPLANT
BALLOON DILATOR 12-15 8 (BALLOONS) IMPLANT
BALLOON DILATOR 15-18 8 (BALLOONS) IMPLANT
BALLOON DILATOR CRE 0-12 8 (BALLOONS) IMPLANT
BLOCK BITE 60FR ADLT L/F GRN (MISCELLANEOUS) ×3 IMPLANT
CLIP HMST 235XBRD CATH ROT (MISCELLANEOUS) IMPLANT
CLIP RESOLUTION 360 11X235 (MISCELLANEOUS)
ELECT REM PT RETURN 9FT ADLT (ELECTROSURGICAL)
ELECTRODE REM PT RTRN 9FT ADLT (ELECTROSURGICAL) IMPLANT
FCP ESCP3.2XJMB 240X2.8X (MISCELLANEOUS) ×2
FORCEPS BIOP RAD 4 LRG CAP 4 (CUTTING FORCEPS) ×3 IMPLANT
FORCEPS BIOP RJ4 240 W/NDL (MISCELLANEOUS) ×3
FORCEPS ESCP3.2XJMB 240X2.8X (MISCELLANEOUS) ×2 IMPLANT
GOWN CVR UNV OPN BCK APRN NK (MISCELLANEOUS) ×4 IMPLANT
GOWN ISOL THUMB LOOP REG UNIV (MISCELLANEOUS) ×6
INJECTOR VARIJECT VIN23 (MISCELLANEOUS) IMPLANT
KIT DEFENDO VALVE AND CONN (KITS) IMPLANT
KIT PRC NS LF DISP ENDO (KITS) ×2 IMPLANT
KIT PROCEDURE OLYMPUS (KITS) ×3
MANIFOLD NEPTUNE II (INSTRUMENTS) ×3 IMPLANT
MARKER SPOT ENDO TATTOO 5ML (MISCELLANEOUS) IMPLANT
RETRIEVER NET PLAT FOOD (MISCELLANEOUS) IMPLANT
SNARE SHORT THROW 13M SML OVAL (MISCELLANEOUS) IMPLANT
SNARE SHORT THROW 30M LRG OVAL (MISCELLANEOUS) IMPLANT
SPOT EX ENDOSCOPIC TATTOO (MISCELLANEOUS)
SYR INFLATION 60ML (SYRINGE) IMPLANT
TRAP ETRAP POLY (MISCELLANEOUS) IMPLANT
VARIJECT INJECTOR VIN23 (MISCELLANEOUS)
WATER STERILE IRR 250ML POUR (IV SOLUTION) ×3 IMPLANT
WIRE CRE 18-20MM 8CM F G (MISCELLANEOUS) IMPLANT

## 2021-04-07 NOTE — Anesthesia Postprocedure Evaluation (Signed)
Anesthesia Post Note  Patient: CORYNN SOLBERG  Procedure(s) Performed: ESOPHAGOGASTRODUODENOSCOPY (EGD) WITH PROPOFOL (N/A ) POLYPECTOMY     Patient location during evaluation: PACU Anesthesia Type: General Level of consciousness: awake and alert Pain management: pain level controlled Vital Signs Assessment: post-procedure vital signs reviewed and stable Respiratory status: spontaneous breathing, nonlabored ventilation, respiratory function stable and patient connected to nasal cannula oxygen Cardiovascular status: blood pressure returned to baseline and stable Postop Assessment: no apparent nausea or vomiting Anesthetic complications: no   No complications documented.  Mikeila Burgen A  Kailin Principato

## 2021-04-07 NOTE — Anesthesia Procedure Notes (Signed)
Date/Time: 04/07/2021 10:37 AM Performed by: Dionne Bucy, CRNA Pre-anesthesia Checklist: Patient identified, Emergency Drugs available, Suction available, Patient being monitored and Timeout performed Patient Re-evaluated:Patient Re-evaluated prior to induction Oxygen Delivery Method: Nasal cannula Placement Confirmation: positive ETCO2

## 2021-04-07 NOTE — Transfer of Care (Signed)
Immediate Anesthesia Transfer of Care Note  Patient: Valerie Curry  Procedure(s) Performed: ESOPHAGOGASTRODUODENOSCOPY (EGD) WITH PROPOFOL (N/A ) POLYPECTOMY  Patient Location: PACU  Anesthesia Type: General  Level of Consciousness: awake, alert  and patient cooperative  Airway and Oxygen Therapy: Patient Spontanous Breathing and Patient connected to supplemental oxygen  Post-op Assessment: Post-op Vital signs reviewed, Patient's Cardiovascular Status Stable, Respiratory Function Stable, Patent Airway and No signs of Nausea or vomiting  Post-op Vital Signs: Reviewed and stable  Complications: No complications documented.

## 2021-04-07 NOTE — H&P (Signed)
Vonda Antigua, MD [address], [place], [place], [place], [address] [address], [place], [place], [place], [address] Phone: [phone]  Fax: [phone]  Primary Care Physician:  Burnard Hawthorne, FNP   Pre-Procedure History & Physical: HPI:  Valerie Curry is a 75 y.o. female is here for an EGD.   Past Medical History:  Diagnosis Date  . Arthritis   . Car sickness   . Complication of anesthesia    hard to wake up  . Coronary artery disease 11/2018   calcification in LAD and RCA in screening lung scan   . Family history of malignant neoplasm of breast    Maternal/paternal aunts and cousin  . Hypertension   . Left bundle branch block   . Lump or mass in breast 2011/2012   benign  . Obesity, unspecified   . Osteopenia    took Boniva 1 1/2 years in past-doesn't want to restart it  . Personal history of tobacco use, presenting hazards to health   . PONV (postoperative nausea and vomiting)   . Positive PPD    as a child. xray was normal.  . Systemic sclerosis [facility])     Past Surgical History:  Procedure Laterality Date  . APPENDECTOMY  1961  . BREAST BIOPSY Right Nov 2011   benign  . BREAST BIOPSY Left Nov 2012   dilated ducts-benign  . BUNIONECTOMY Bilateral 2010   x 2  . CHOLECYSTECTOMY N/A 10/03/2019   Procedure: LAPAROSCOPIC CHOLECYSTECTOMY;  Surgeon: Fredirick Maudlin, MD;  Location: [facility] ORS;  Service: General;  Laterality: N/A;  . COLONOSCOPY  8/05   Dr. Vira Agar  . COLONOSCOPY WITH PROPOFOL N/A 01/10/2019   Procedure: COLONOSCOPY WITH PROPOFOL;  Surgeon: Virgel Manifold, MD;  Location: [facility] ENDOSCOPY;  Service: Endoscopy;  Laterality: N/A;  . DECOMPRESSIVE LUMBAR LAMINECTOMY LEVEL 2 N/A 04/30/2015   Procedure: CENTRAL DECOMPRESSIVE LUMBER LAMINECTOMY WITH POSSIBLE FORAMINOTOMIES L3-L4,L4-L5;  Surgeon: Latanya Maudlin, MD;  Location: [facility] ORS;  Service: Orthopedics;  Laterality: N/A;  . TONSILLECTOMY  1968  . TUBAL LIGATION  1974    Prior to  Admission medications   Medication Sig Start Date End Date Taking? Authorizing Provider  amLODipine (NORVASC) 5 MG tablet Take 1 tablet (5 mg total) by mouth daily. 02/12/21  Yes Agbor-Etang, Aaron Edelman, MD  Ascorbic Acid 500 MG CHEW Chew by mouth daily.   Yes [provider]  Cholecalciferol (VITAMIN D) 50 MCG (2000 UT) CAPS Take 2,000 Units by mouth daily.   Yes [provider]  Evolocumab (REPATHA SURECLICK) 431 MG/ML SOAJ Inject 1 pen into the skin every 14 (fourteen) days. 11/20/20  Yes Agbor-Etang, Aaron Edelman, MD  meloxicam (MOBIC) 15 MG tablet Take 15 mg by mouth as needed. 06/02/20  Yes [provider]  Multiple Vitamins-Minerals (PRESERVISION AREDS 2 PO) Take by mouth daily.   Yes [provider]  aspirin EC 81 MG tablet Take 81 mg by mouth daily.    [provider]    Allergies as of 04/01/2021  . (No Known Allergies)    Family History  Problem Relation Age of Onset  . Lung cancer Father   . Heart disease Father 40       Cardiac Arrest  . Heart disease Mother   . Hypertension Mother   . Stroke Mother   . Other Brother        blood clots  . Other Other        hypercoag-family history  . Cancer Maternal Aunt  Breast cancer  . Breast cancer Maternal Aunt 13  . Breast cancer Paternal Aunt 4  . Breast cancer Cousin     Social History   Socioeconomic History  . Marital status: Married    Spouse name: Not on file  . Number of children: 2  . Years of education: 62  . Highest education level: Not on file  Occupational History  . Occupation: Facilities manager: [employer]  Tobacco Use  . Smoking status: Former Smoker    Packs/day: 1.00    Years: 50.00    Pack years: 50.00    Types: Cigarettes    Quit date: 11/29/2014    Years since quitting: 6.3  . Smokeless tobacco: Former Systems developer    Quit date: 04/30/2015  Vaping Use  . Vaping Use: Never used  Substance and Sexual Activity  . Alcohol use: Yes     Alcohol/week: 0.0 standard drinks    Comment: occasional glassof wine   . Drug use: No  . Sexual activity: Yes    Birth control/protection: Post-menopausal  Other Topics Concern  . Not on file  Social History Narrative   Pt is 75yo female. Pt was born in [place], moved to [place] when she was 89.  Pt is married to husband of 22 years.  Pt has 2 sons from a previous marriage and 4 step sons and 21 Grandchildren.        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        Pt works out at gym 3/week (2 days a week in a class, 1 day a week with an Dietitian).  Pt enjoys reading, spending time with her family. Pt and her husband are members of [employer] and active in their church.    Social Determinants of Health   Financial Resource Strain: Not on file  Food Insecurity: Not on file  Transportation Needs: Not on file  Physical Activity: Not on file  Stress: Not on file  Social Connections: Not on file  Intimate Partner Violence: Not on file    Review of Systems: See HPI, otherwise negative ROS  Physical Exam: BP 131/68   Pulse 73   Temp (!) 97.5 F (36.4 C) (Temporal)   Resp 18   Ht 5\' 2"  (1.575 m)   Wt 75.3 kg   SpO2 99%   BMI 30.36 kg/m  General:   Alert,  pleasant and cooperative in NAD Head:  Normocephalic and atraumatic. Neck:  Supple; no masses or thyromegaly. Lungs:  Clear throughout to auscultation, normal respiratory effort.    Heart:  +S1, +S2, Regular rate and rhythm, No edema. Abdomen:  Soft, nontender and nondistended. Normal bowel sounds, without guarding, and without rebound.   Neurologic:  Alert and  oriented x4;  grossly normal neurologically.  Impression/Plan: Valerie Curry is here for an EGD for Abdominal pain  Risks, benefits, limitations, and alternatives regarding the procedure have been reviewed with the patient.  Questions have been answered.  All parties agreeable.   Virgel Manifold, MD   04/07/2021, 9:33 AM

## 2021-04-07 NOTE — Anesthesia Preprocedure Evaluation (Signed)
Anesthesia Evaluation  Patient identified by MRN, date of birth, ID band Patient awake    Reviewed: Allergy & Precautions, NPO status , Patient's Chart, lab work & pertinent test results, reviewed documented beta blocker date and time   History of Anesthesia Complications (+) PONV and history of anesthetic complications  Airway Mallampati: III  TM Distance: <3 FB Neck ROM: Full    Dental   Pulmonary former smoker,    breath sounds clear to auscultation       Cardiovascular hypertension, (-) angina+ CAD and + Peripheral Vascular Disease  (-) DOE + dysrhythmias (RBBB, LBBB)  Rhythm:Regular Rate:Normal   HLD   Neuro/Psych  Neuromuscular disease (Spinal stenosis)    GI/Hepatic neg GERD  ,  Endo/Other    Renal/GU      Musculoskeletal  (+) Arthritis , Osteoarthritis,    Abdominal (+) + obese (BMI 30),   Peds  Hematology   Anesthesia Other Findings   Reproductive/Obstetrics                             Anesthesia Physical Anesthesia Plan  ASA: II  Anesthesia Plan: General   Post-op Pain Management:    Induction: Intravenous  PONV Risk Score and Plan: 4 or greater and Propofol infusion, TIVA, Treatment may vary due to age or medical condition, Ondansetron and Dexamethasone  Airway Management Planned: Natural Airway and Nasal Cannula  Additional Equipment:   Intra-op Plan:   Post-operative Plan:   Informed Consent: I have reviewed the patients History and Physical, chart, labs and discussed the procedure including the risks, benefits and alternatives for the proposed anesthesia with the patient or authorized representative who has indicated his/her understanding and acceptance.       Plan Discussed with: CRNA and Anesthesiologist  Anesthesia Plan Comments:         Anesthesia Quick Evaluation

## 2021-04-07 NOTE — Op Note (Signed)
[facility] Patient Name: Valerie Curry Procedure Date: 04/07/2021 10:28 AM MRN: [id number] Account #: [id number] Date of Birth: February 26, 1946 Admit Type: Outpatient Age: 75 Room: [facility] OR ROOM 01 Gender: Female Note Status: Finalized Procedure:             Upper GI endoscopy Indications:           Abdominal pain Providers:             Ethen Bannan B. Bonna Gains MD, MD Referring MD:          Yvetta Coder. Arnett (Referring MD) Medicines:             Monitored Anesthesia Care Complications:         No immediate complications. Procedure:             Pre-Anesthesia Assessment:                        - Prior to the procedure, a History and Physical was                         performed, and patient medications, allergies and                         sensitivities were reviewed. The patient's tolerance                         of previous anesthesia was reviewed.                        - The risks and benefits of the procedure and the                         sedation options and risks were discussed with the                         patient. All questions were answered and informed                         consent was obtained.                        - Patient identification and proposed procedure were                         verified prior to the procedure by the physician, the                         nurse, the anesthesiologist, the anesthetist and the                         technician. The procedure was verified in the                         procedure room.                        - ASA Grade Assessment: II - A patient with mild                         systemic disease.  After obtaining informed consent, the endoscope was                         passed under direct vision. Throughout the procedure,                         the patient's blood pressure, pulse, and oxygen                         saturations were monitored continuously. The was                          introduced through the mouth, and advanced to the                         second part of duodenum. The upper GI endoscopy was                         accomplished with ease. The patient tolerated the                         procedure well. Findings:      2 small island seen. No other visible abnormalities were present. Mucosa       was biopsied with a cold forceps for histology in a targeted manner and       in 4 quadrants. Biopsies were taken with a cold forceps for histology.      4 areas of ectopic gastric mucosa were found in the proximal esophagus.       Biopsies were taken with a cold forceps for histology.      The exam of the esophagus was otherwise normal.      A single 4 mm erosion with no bleeding and no stigmata of recent       bleeding was found in the gastric antrum. Biopsies were taken with a       cold forceps for histology.      Patchy mildly erythematous mucosa without bleeding was found in the       gastric antrum. Biopsies were taken with a cold forceps for histology.       Biopsies were obtained in the gastric body, at the incisura and in the       gastric antrum with cold forceps for histology.      A small hiatal hernia was present.      The exam of the stomach was otherwise normal.      A single 6 mm sessile polyp was found in the duodenal bulb. Biopsies       were taken with a cold forceps for histology. Only a small biopsy was       done and most of the polyp was left in place.      The exam of the duodenum was otherwise normal.      The second portion of the duodenum and examined duodenum were normal.       Biopsies were taken with a cold forceps for histology. Impression:            - Salmon-colored mucosa suspicious for short-segment                         Barrett's esophagus. Biopsied.                        -  Ectopic gastric mucosa in the proximal esophagus.                         Biopsied.                        - Erosive  gastropathy with no bleeding and no stigmata                         of recent bleeding. Biopsied.                        - Erythematous mucosa in the antrum. Biopsied.                        - Small hiatal hernia.                        - A single duodenal polyp. Biopsied.                        - Normal second portion of the duodenum and examined                         duodenum. Biopsied.                        - Biopsies were obtained in the gastric body, at the                         incisura and in the gastric antrum. Recommendation:        - Await pathology results.                        - Consider referral to hiatal hernia surgery after                         pathology results due to patient's ongoing                         intermittent symptoms.                        - Follow an antireflux regimen.                        - Discharge patient to home (with escort).                        - Advance diet as tolerated.                        - Continue present medications.                        - Patient has a contact number available for                         emergencies. The signs and symptoms of potential                         delayed complications  were discussed with the patient.                         Return to normal activities tomorrow. Written                         discharge instructions were provided to the patient.                        - Discharge patient to home (with escort).                        - The findings and recommendations were discussed with                         the patient.                        - The findings and recommendations were discussed with                         the patient's family. Procedure Code(s):     --- Professional ---                        [phone], Esophagogastroduodenoscopy, flexible,                         transoral; with biopsy, single or multiple Diagnosis Code(s):     --- Professional ---                         K22.8, Other specified diseases of esophagus                        Q40.2, Other specified congenital malformations of                         stomach                        K31.89, Other diseases of stomach and duodenum                        K44.9, Diaphragmatic hernia without obstruction or                         gangrene                        K31.7, Polyp of stomach and duodenum                        R10.9, Unspecified abdominal pain CPT copyright 2019 American Medical Association. All rights reserved. The codes documented in this report are preliminary and upon coder review may  be revised to meet current compliance requirements.  Vonda Antigua, MD Margretta Sidle B. Bonna Gains MD, MD 04/07/2021 11:03:41 AM This report has been signed electronically. Number of Addenda: 0 Note Initiated On: 04/07/2021 10:28 AM Total Procedure Duration: 0 hours 14 minutes 47 seconds  Estimated Blood Loss:  Estimated blood loss: none.      [facility]

## 2021-04-08 ENCOUNTER — Encounter: Payer: Self-pay | Admitting: Gastroenterology

## 2021-04-08 LAB — SURGICAL PATHOLOGY

## 2021-04-19 ENCOUNTER — Other Ambulatory Visit: Payer: Self-pay | Admitting: Family

## 2021-04-19 DIAGNOSIS — I10 Essential (primary) hypertension: Secondary | ICD-10-CM

## 2021-04-21 ENCOUNTER — Telehealth (INDEPENDENT_AMBULATORY_CARE_PROVIDER_SITE_OTHER): Payer: PPO | Admitting: Gastroenterology

## 2021-04-21 DIAGNOSIS — R1013 Epigastric pain: Secondary | ICD-10-CM | POA: Diagnosis not present

## 2021-04-21 DIAGNOSIS — K449 Diaphragmatic hernia without obstruction or gangrene: Secondary | ICD-10-CM

## 2021-04-21 NOTE — Progress Notes (Signed)
Vonda Antigua, MD [address]  [place]  [place], [place] [address]  Main: [phone]  Fax: [phone]   Primary Care Physician: Burnard Hawthorne, FNP  Virtual Visit via Video Note  I connected with patient on 04/21/21 at  1:30 PM EDT by video and verified that I am speaking with the correct person using two identifiers.   I discussed the limitations, risks, security and privacy concerns of performing an evaluation and management service by video and the availability of in person appointments. I also discussed with the patient that there may be a patient responsible charge related to this service. The patient expressed understanding and agreed to proceed.  Location of Patient: Home Location of Provider: Home Persons involved: Patient and provider only (Nursing staff checked in patient via phone but were not physically involved in the video interaction - see their notes)   History of Present Illness: Chief Complaint  Patient presents with  . Follow-up    Discuss referral hernia    HPI: Valerie Curry is a 75 y.o. female here for follow-up of mid epigastric, episodic abdominal pain.  Patient underwent EGD which showed salmon-colored mucosa in the distal esophagus, gastric erosions, ectopic gastric mucosa in the proximal esophagus, gastric erythema, small hiatal hernia, single duodenal polyp.  Duodenal polyp showed nodular peptic duodenitis.  Gastric erosions did not show any evidence of H. pylori.  Esophageal biopsies showed mild chronic inflammation, no intestinal metaplasia.  Proximal esophagus biopsy showed gastric heterotopia.  Patient remains completely asymptomatic between episodes that occur every 3 to 6 months.  No weight loss.  Current Outpatient Medications  Medication Sig Dispense Refill  . amLODipine (NORVASC) 5 MG tablet Take 1 tablet (5 mg total) by mouth daily. 30 tablet 11  . Ascorbic Acid 500 MG CHEW Chew by mouth daily.    Marland Kitchen aspirin EC 81 MG  tablet Take 81 mg by mouth daily.    . Cholecalciferol (VITAMIN D) 50 MCG (2000 UT) CAPS Take 2,000 Units by mouth daily.    . Evolocumab (REPATHA SURECLICK) 818 MG/ML SOAJ Inject 1 pen into the skin every 14 (fourteen) days. 6 mL 3  . meloxicam (MOBIC) 15 MG tablet Take 15 mg by mouth as needed.    . Multiple Vitamins-Minerals (PRESERVISION AREDS 2 PO) Take by mouth daily.     No current facility-administered medications for this visit.    Allergies as of 04/21/2021  . (No Known Allergies)    Review of Systems:    All systems reviewed and negative except where noted in HPI.   Observations/Objective:  Labs: CMP     Component Value Date/Time   NA 140 12/30/2020 1114   NA 140 07/01/2020 0000   K 4.0 12/30/2020 1114   CL 103 12/30/2020 1114   CO2 30 12/30/2020 1114   GLUCOSE 81 12/30/2020 1114   BUN 20 12/30/2020 1114   BUN 20 07/01/2020 0000   CREATININE 0.71 12/30/2020 1114   CALCIUM 9.8 12/30/2020 1114   PROT 6.4 12/30/2020 1114   PROT 6.2 07/01/2020 0000   ALBUMIN 4.1 12/30/2020 1114   ALBUMIN 4.1 07/01/2020 0000   AST 23 12/30/2020 1114   ALT 25 12/30/2020 1114   ALKPHOS 88 12/30/2020 1114   BILITOT 0.6 12/30/2020 1114   BILITOT 0.6 07/01/2020 0000   GFRNONAA >60 08/06/2020 1344   GFRAA >60 08/06/2020 1344   Lab Results  Component Value Date   WBC 5.6 08/06/2020   HGB 13.5 08/06/2020  HCT 39.8 08/06/2020   MCV 91.1 08/06/2020   PLT 185 08/06/2020    Imaging Studies: No results found.  Assessment and Plan:   Valerie Curry is a 75 y.o. y/o female here for follow-up of abdominal pain  Assessment and Plan: Given extensive work-up, including referral to tertiary care center has not revealed any specific significant findings to explain her very episodic abdominal pain symptoms, I would recommend surgical opinion given that a hiatal hernia was seen and a hiatal hernia can sometimes cause episodic symptoms  I would not expect the small gastric erosions or  gastric erythema seen in her upper endoscopy to be causing the very infrequent episodic abdominal pain symptoms that patient has, that did not improve with PPI  I discussed the above findings with patient's in detail and patient is agreeable to surgical referral.  Patient has had gallbladder surgery with [facility] before, and would like any future surgeries to be done at [facility], therefore I have reached out to Dr. Dahlia Byes who performs hiatal hernia surgeries at [facility] and if he is agreeable to seeing her, we can place  official referral for the patient.    Follow Up Instructions:    I discussed the assessment and treatment plan with the patient. The patient was provided an opportunity to ask questions and all were answered. The patient agreed with the plan and demonstrated an understanding of the instructions.   The patient was advised to call back or seek an in-person evaluation if the symptoms worsen or if the condition fails to improve as anticipated.  I provided 10 minutes of face-to-face time via video software during this encounter. Additional time was spent in reviewing patient's chart, placing orders etc.   Virgel Manifold, MD  Speech recognition software was used to dictate this note.

## 2021-04-22 ENCOUNTER — Other Ambulatory Visit: Payer: Self-pay

## 2021-04-22 DIAGNOSIS — K449 Diaphragmatic hernia without obstruction or gangrene: Secondary | ICD-10-CM

## 2021-05-04 ENCOUNTER — Other Ambulatory Visit: Payer: Self-pay

## 2021-05-04 ENCOUNTER — Encounter: Payer: Self-pay | Admitting: Surgery

## 2021-05-04 ENCOUNTER — Ambulatory Visit: Payer: PPO | Admitting: Surgery

## 2021-05-04 VITALS — BP 130/78 | HR 77 | Temp 98.3°F | Ht 62.0 in | Wt 168.0 lb

## 2021-05-04 DIAGNOSIS — K449 Diaphragmatic hernia without obstruction or gangrene: Secondary | ICD-10-CM | POA: Diagnosis not present

## 2021-05-04 DIAGNOSIS — R1013 Epigastric pain: Secondary | ICD-10-CM

## 2021-05-04 NOTE — Patient Instructions (Addendum)
We will get you scheduled for a Barium Swallow study and Gastric Emptying study, these are done at [facility].  We will also get you set up for a [place] Probe study. This is done at [facility], and they will call you to schedule this with them.  We will have you come back here to talk about the results once done.    You are scheduled for a Barium swallow at [facility], [place] entrance on 05/15/21 at 9:30 am. You will need to arrive there by 9:00 am and have nothing to eat or drink for 3 hours prior.  You are scheduled for a Gastric Emptying Study at [facility], [place] entrance on 06/03/21 at 8:00 am. You will need to arrive there by 7:30 am and have nothing to eat or drink after midnight the night prior.

## 2021-05-07 NOTE — Progress Notes (Signed)
Patient ID: Valerie Curry, female   DOB: 1946/08/13, 75 y.o.   MRN: [id number]  HPI Valerie Curry is a 75 y.o. female seen in consultation at the request of Dr. Bonna Gains.  She comes in with chronic midepigastric abdominal pain that is colicky intermittent without a specific pattern.  Please also note that this episodes occurred intermittently and a sporadically.  They come and go every few months.  She denies any specific heartburn.  The pain is in the mid epigastric area .  She recently underwent an endoscopic evaluation by Dr. Bonna Gains and the EGD showed some superficial gastric erosions with a small hiatal hernia and some esophagitis.  She has been on PPI without any major changes in symptomatology. She already had a cholecystectomy and at that time the symptoms were thought to be coming from her gallbladder.  She is states that cholecystectomy did not do much for her symptoms.  She did have a CT scan of the chest few months ago and there is very minimal sliding hiatal hernia.  Lab work including normal CMP, normal CBC and TSH.  She is able to perform more than 4 METS of activity without any shortness of or chest pain.  Please note that I have personally reviewed endoscopic images as well as CT scan.  HPI  Past Medical History:  Diagnosis Date   Arthritis    Car sickness    Complication of anesthesia    hard to wake up   Coronary artery disease 11/2018   calcification in LAD and RCA in screening lung scan    Family history of malignant neoplasm of breast    Maternal/paternal aunts and cousin   Hypertension    Left bundle branch block    Lump or mass in breast 2011/2012   benign   Obesity, unspecified    Osteopenia    took Boniva 1 1/2 years in past-doesn't want to restart it   Personal history of tobacco use, presenting hazards to health    PONV (postoperative nausea and vomiting)    Positive PPD    as a child. xray was normal.   Systemic sclerosis [facility])     Past Surgical  History:  Procedure Laterality Date   APPENDECTOMY  1961   BREAST BIOPSY Right Nov 2011   benign   BREAST BIOPSY Left Nov 2012   dilated ducts-benign   BUNIONECTOMY Bilateral 2010   x 2   CHOLECYSTECTOMY N/A 10/03/2019   Procedure: LAPAROSCOPIC CHOLECYSTECTOMY;  Surgeon: Fredirick Maudlin, MD;  Location: [facility] ORS;  Service: General;  Laterality: N/A;   COLONOSCOPY  8/05   Dr. Vira Agar   COLONOSCOPY WITH PROPOFOL N/A 01/10/2019   Procedure: COLONOSCOPY WITH PROPOFOL;  Surgeon: Virgel Manifold, MD;  Location: [facility] END[facility]COPY;  Service: Endoscopy;  Laterality: N/A;   DECOMPRESSIVE LUMBAR LAMINECTOMY LEVEL 2 N/A 04/30/2015   Procedure: CENTRAL DECOMPRESSIVE LUMBER LAMINECTOMY WITH POSSIBLE FORAMINOTOMIES L3-L4,L4-L5;  Surgeon: Latanya Maudlin, MD;  Location: [facility] OR[facility];  Service: Orthopedics;  Laterality: N/A;   ESOPHAGOGASTRODUODENOSCOPY (EGD) WITH PROPOFOL N/A 04/07/2021   Procedure: ESOPHAGOGASTRODUODENOSCOPY (EGD) WITH PROPOFOL;  Surgeon: Virgel Manifold, MD;  Location: [place];  Service: Endoscopy;  Laterality: N/A;   POLYPECTOMY  04/07/2021   Procedure: POLYPECTOMY;  Surgeon: Virgel Manifold, MD;  Location: [place];  Service: Endoscopy;;   TONSILLECTOMY  1968   TUBAL LIGATION  1974    Family History  Problem Relation Age of Onset   Lung cancer Father    Heart  disease Father 74       Cardiac Arrest   Heart disease Mother    Hypertension Mother    Stroke Mother    Other Brother        blood clots   Other Other        hypercoag-family history   Cancer Maternal Aunt        Breast cancer   Breast cancer Maternal Aunt 51   Breast cancer Paternal Aunt 63   Breast cancer Cousin     Social History Social History   Tobacco Use   Smoking status: Former    Packs/day: 1.00    Years: 50.00    Pack years: 50.00    Types: Cigarettes    Quit date: 11/29/2014    Years since quitting: 6.4   Smokeless tobacco: Former    Quit date: 04/30/2015  Vaping Use    Vaping Use: Never used  Substance Use Topics   Alcohol use: Yes    Alcohol/week: 0.0 standard drinks    Comment: occasional glassof wine    Drug use: No    No Known Allergies  Current Outpatient Medications  Medication Sig Dispense Refill   amLODipine (NORVASC) 5 MG tablet Take 1 tablet (5 mg total) by mouth daily. 30 tablet 11   Ascorbic Acid 500 MG CHEW Chew by mouth daily.     aspirin EC 81 MG tablet Take 81 mg by mouth daily.     Cholecalciferol (VITAMIN D) 50 MCG (2000 UT) CAPS Take 2,000 Units by mouth daily.     Evolocumab (REPATHA SURECLICK) 419 MG/ML SOAJ Inject 1 pen into the skin every 14 (fourteen) days. 6 mL 3   meloxicam (MOBIC) 15 MG tablet Take 15 mg by mouth as needed.     Multiple Vitamins-Minerals (PRESERVISION AREDS 2 PO) Take by mouth daily.     No current facility-administered medications for this visit.     Review of Systems Full ROS  was asked and was negative except for the information on the HPI  Physical Exam Blood pressure 130/78, pulse 77, temperature 98.3 F (36.8 C), height 5\' 2"  (1.575 m), weight 168 lb (76.2 kg), SpO2 97 %. CONSTITUTIONAL: NAD EYES: Pupils are equal, round, a Sclera are non-icteric. EARS, NOSE, MOUTH AND THROAT: She is wearing a mask Hearing is intact to voice. LYMPH NODES:  Lymph nodes in the neck are normal. RESPIRATORY:  Lungs are clear. There is normal respiratory effort, with equal breath sounds bilaterally, and without pathologic use of accessory muscles. CARDIOVASCULAR: Heart is regular without murmurs, gallops, or rubs. GI: The abdomen is  soft, nontender, and nondistended. There are no palpable masses. There is no hepatosplenomegaly. There are normal bowel sounds in all quadrants. GU: Rectal deferred.   MUSCULOSKELETAL: Normal muscle strength and tone. No cyanosis or edema.   SKIN: Turgor is good and there are no pathologic skin lesions or ulcers. NEUROLOGIC: Motor and sensation is grossly normal. Cranial nerves are  grossly intact. PSYCH:  Oriented to person, place and time. Affect is normal.  Data Reviewed  I have personally reviewed the patient's imaging, laboratory findings and medical records.    Assessment/Plan 75 yo female with history of abdominal pain chronically and functional GI issues with a small sliding hiatal hernia.  Difficult to establish a causal relationship of the hiatal hernia with her current symptoms.  I do think that she will benefit from further work-up to include a ph probe as well as a barium swallow.  I  will be happy to see her after the work-up is completed and talk to her more about potential hiatal hernia as cause of her symptoms.  At this time I am not necessarily convinced that we can attribute your symptoms to the hiatal hernia.  I was very candid and honest with her about my thought process and she was very appreciative.  I have discussed with Dr. Bonna Gains about this case in detail.  We will follow her once she completes her work-up She may have gastroparesis and might need gastric emtying study as well. Time spent with the patient was 65 minutes, with more than 50% of the time spent in face-to-face education, counseling and care coordination.     Caroleen Hamman, MD FACS General Surgeon 05/07/2021, 2:28 PM

## 2021-05-15 ENCOUNTER — Other Ambulatory Visit: Payer: Self-pay | Admitting: Surgery

## 2021-05-15 ENCOUNTER — Ambulatory Visit
Admission: RE | Admit: 2021-05-15 | Discharge: 2021-05-15 | Disposition: A | Discharge status: Home / Self Care | Payer: PPO | Source: Ambulatory Visit | Attending: Surgery | Admitting: Surgery

## 2021-05-15 ENCOUNTER — Other Ambulatory Visit: Payer: Self-pay

## 2021-05-15 DIAGNOSIS — R1013 Epigastric pain: Secondary | ICD-10-CM | POA: Diagnosis not present

## 2021-05-15 DIAGNOSIS — K449 Diaphragmatic hernia without obstruction or gangrene: Secondary | ICD-10-CM | POA: Diagnosis not present

## 2021-05-15 DIAGNOSIS — K219 Gastro-esophageal reflux disease without esophagitis: Secondary | ICD-10-CM | POA: Diagnosis not present

## 2021-05-18 ENCOUNTER — Telehealth: Payer: Self-pay

## 2021-05-18 NOTE — Telephone Encounter (Signed)
Pt notified of BS results. Verbalizes understanding.

## 2021-05-19 DIAGNOSIS — H353131 Nonexudative age-related macular degeneration, bilateral, early dry stage: Secondary | ICD-10-CM | POA: Diagnosis not present

## 2021-05-25 DIAGNOSIS — D225 Melanocytic nevi of trunk: Secondary | ICD-10-CM | POA: Diagnosis not present

## 2021-05-25 DIAGNOSIS — L309 Dermatitis, unspecified: Secondary | ICD-10-CM | POA: Diagnosis not present

## 2021-05-25 DIAGNOSIS — D2262 Melanocytic nevi of left upper limb, including shoulder: Secondary | ICD-10-CM | POA: Diagnosis not present

## 2021-05-25 DIAGNOSIS — D2261 Melanocytic nevi of right upper limb, including shoulder: Secondary | ICD-10-CM | POA: Diagnosis not present

## 2021-05-25 DIAGNOSIS — D2271 Melanocytic nevi of right lower limb, including hip: Secondary | ICD-10-CM | POA: Diagnosis not present

## 2021-05-25 DIAGNOSIS — D2272 Melanocytic nevi of left lower limb, including hip: Secondary | ICD-10-CM | POA: Diagnosis not present

## 2021-05-27 ENCOUNTER — Encounter: Payer: Self-pay | Admitting: Family

## 2021-06-03 ENCOUNTER — Other Ambulatory Visit: Payer: PPO

## 2021-06-05 ENCOUNTER — Other Ambulatory Visit: Payer: PPO

## 2021-06-15 ENCOUNTER — Ambulatory Visit: Payer: PPO | Admitting: Surgery

## 2021-06-19 ENCOUNTER — Other Ambulatory Visit: Payer: Self-pay

## 2021-06-19 ENCOUNTER — Ambulatory Visit
Admission: RE | Admit: 2021-06-19 | Discharge: 2021-06-19 | Disposition: A | Discharge status: Home / Self Care | Payer: PPO | Source: Ambulatory Visit | Attending: Surgery | Admitting: Surgery

## 2021-06-19 DIAGNOSIS — R112 Nausea with vomiting, unspecified: Secondary | ICD-10-CM | POA: Diagnosis not present

## 2021-06-19 DIAGNOSIS — R1013 Epigastric pain: Secondary | ICD-10-CM | POA: Insufficient documentation

## 2021-06-19 MED ORDER — TECHNETIUM TC 99M SULFUR COLLOID
2.2100 | Freq: Once | INTRAVENOUS | Status: AC | PRN
  Administered 2021-06-19: 2.21 via ORAL

## 2021-06-24 ENCOUNTER — Ambulatory Visit: Payer: PPO | Admitting: Surgery

## 2021-06-24 ENCOUNTER — Other Ambulatory Visit: Payer: Self-pay

## 2021-06-24 ENCOUNTER — Encounter: Payer: Self-pay | Admitting: Surgery

## 2021-06-24 VITALS — BP 136/80 | HR 69 | Temp 98.7°F | Ht 62.0 in | Wt 168.0 lb

## 2021-06-24 DIAGNOSIS — K449 Diaphragmatic hernia without obstruction or gangrene: Secondary | ICD-10-CM

## 2021-06-24 NOTE — Patient Instructions (Signed)
   Follow-up with our office as needed.  Please call and ask to speak with a nurse if you develop questions or concerns.  

## 2021-06-25 ENCOUNTER — Encounter: Payer: Self-pay | Admitting: Family

## 2021-06-25 NOTE — Progress Notes (Signed)
Outpatient Surgical Follow Up  06/25/2021  Valerie Curry is an 75 y.o. female.   Chief Complaint  Patient presents with   Follow-up    Hiatal hernia     HPI: Valerie Curry is a 75 year old female well-known to me she is following out for a small paraesophageal hernia.  We did complete gastric emptying study as well as a barium swallow.  Please note that I have personally reviewed the images.  There is evidence of a small hiatal hernia and reflux.  She had a normal gastric emptying study.  She is states that she has some occasional abdominal discomfort or pain.  I was very candid with her.  I am not sure whether or not repair of paraesophageal hernia will alleviate her symptoms.  She expresses that she has no dysphagia or heartburn.  Past Medical History:  Diagnosis Date   Arthritis    Car sickness    Complication of anesthesia    hard to wake up   Coronary artery disease 11/2018   calcification in LAD and RCA in screening lung scan    Family history of malignant neoplasm of breast    Maternal/paternal aunts and cousin   Hypertension    Left bundle branch block    Lump or mass in breast 2011/2012   benign   Obesity, unspecified    Osteopenia    took Boniva 1 1/2 years in past-doesn't want to restart it   Personal history of tobacco use, presenting hazards to health    PONV (postoperative nausea and vomiting)    Positive PPD    as a child. xray was normal.   Systemic sclerosis [facility])     Past Surgical History:  Procedure Laterality Date   APPENDECTOMY  1961   BREAST BIOPSY Right Nov 2011   benign   BREAST BIOPSY Left Nov 2012   dilated ducts-benign   BUNIONECTOMY Bilateral 2010   x 2   CHOLECYSTECTOMY N/A 10/03/2019   Procedure: LAPAROSCOPIC CHOLECYSTECTOMY;  Surgeon: Fredirick Maudlin, MD;  Location: [facility];  Service: General;  Laterality: N/A;   COLONOSCOPY  8/05   Dr. Vira Agar   COLONOSCOPY WITH PROPOFOL N/A 01/10/2019   Procedure: COLONOSCOPY WITH PROPOFOL;  Surgeon:  Virgel Manifold, MD;  Location: [facility];  Service: Endoscopy;  Laterality: N/A;   DECOMPRESSIVE LUMBAR LAMINECTOMY LEVEL 2 N/A 04/30/2015   Procedure: CENTRAL DECOMPRESSIVE LUMBER LAMINECTOMY WITH POSSIBLE FORAMINOTOMIES L3-L4,L4-L5;  Surgeon: Latanya Maudlin, MD;  Location: [facility];  Service: Orthopedics;  Laterality: N/A;   ESOPHAGOGASTRODUODENOSCOPY (EGD) WITH PROPOFOL N/A 04/07/2021   Procedure: ESOPHAGOGASTRODUODENOSCOPY (EGD) WITH PROPOFOL;  Surgeon: Virgel Manifold, MD;  Location: [place];  Service: Endoscopy;  Laterality: N/A;   POLYPECTOMY  04/07/2021   Procedure: POLYPECTOMY;  Surgeon: Virgel Manifold, MD;  Location: [place];  Service: Endoscopy;;   [place]    Family History  Problem Relation Age of Onset   Lung cancer Father    Heart disease Father 43       Cardiac Arrest   Heart disease Mother    Hypertension Mother    Stroke Mother    Other Brother        blood clots   Other Other        hypercoag-family history   Cancer Maternal Aunt        Breast cancer   Breast cancer Maternal Aunt 59   Breast cancer Paternal Aunt 49   Breast  cancer Cousin     Social History:  reports that she quit smoking about 6 years ago. Her smoking use included cigarettes. She has a 50.00 pack-year smoking history. She quit smokeless tobacco use about 6 years ago. She reports current alcohol use. She reports that she does not use drugs.  Allergies: No Known Allergies  Medications reviewed.    ROS Full ROS performed and is otherwise negative other than what is stated in HPI   BP 136/80   Pulse 69   Temp 98.7 F (37.1 C)   Ht '5\' 2"'$  (1.575 m)   Wt 168 lb (76.2 kg)   SpO2 97%   BMI 30.73 kg/m   Physical Exam Vitals and nursing note reviewed. Exam conducted with a chaperone present.  Constitutional:      General: She is not in acute distress.    Appearance: Normal appearance.  Eyes:     General:         Right eye: No discharge.        Left eye: No discharge.  Cardiovascular:     Rate and Rhythm: Normal rate and regular rhythm.  Pulmonary:     Effort: Pulmonary effort is normal.     Breath sounds: Normal breath sounds. No wheezing.  Abdominal:     General: There is no distension.     Palpations: There is no mass.     Tenderness: There is no abdominal tenderness. There is no guarding or rebound.     Hernia: No hernia is present.  Musculoskeletal:     Cervical back: Normal range of motion and neck supple. No rigidity or tenderness.  Skin:    General: Skin is warm and dry.     Capillary Refill: Capillary refill takes less than 2 seconds.  Neurological:     General: No focal deficit present.     Mental Status: She is alert and oriented to person, place, and time.  Psychiatric:        Mood and Affect: Mood normal.        Behavior: Behavior normal.        Thought Content: Thought content normal.        Judgment: Judgment normal.      Assessment/Plan:  75 year old female with reflux and a small sliding hiatal hernia.  She does have some upper abdominal pains.  I cannot completely attribute symptoms to hiatal hernia however this might be a contributing factor.  Patient is very hesitant about doing any surgical intervention at this time.  For now we will recommend continuation of medical management.  She is welcome to come back and have another discussion regarding repair of her hiatal hernia.  Extensive counseling provided   I spent 40 minutes in this  visit including personally reviewing the images, documenting the chart and counseling/coordination of her care   Caroleen Hamman, MD [place]

## 2021-06-26 ENCOUNTER — Other Ambulatory Visit: Payer: Self-pay

## 2021-06-26 DIAGNOSIS — I10 Essential (primary) hypertension: Secondary | ICD-10-CM

## 2021-06-26 DIAGNOSIS — I700 Atherosclerosis of aorta: Secondary | ICD-10-CM

## 2021-06-26 DIAGNOSIS — R5383 Other fatigue: Secondary | ICD-10-CM

## 2021-06-26 DIAGNOSIS — R748 Abnormal levels of other serum enzymes: Secondary | ICD-10-CM

## 2021-06-29 ENCOUNTER — Other Ambulatory Visit: Payer: Self-pay

## 2021-06-29 ENCOUNTER — Other Ambulatory Visit (INDEPENDENT_AMBULATORY_CARE_PROVIDER_SITE_OTHER): Payer: PPO

## 2021-06-29 DIAGNOSIS — I10 Essential (primary) hypertension: Secondary | ICD-10-CM

## 2021-06-29 DIAGNOSIS — R748 Abnormal levels of other serum enzymes: Secondary | ICD-10-CM

## 2021-06-29 DIAGNOSIS — R5383 Other fatigue: Secondary | ICD-10-CM | POA: Diagnosis not present

## 2021-06-29 DIAGNOSIS — I700 Atherosclerosis of aorta: Secondary | ICD-10-CM

## 2021-06-29 LAB — CBC WITH DIFFERENTIAL/PLATELET
Basophils Absolute: 0 10*3/uL (ref 0.0–0.1)
Basophils Relative: 0.4 % (ref 0.0–3.0)
Eosinophils Absolute: 0.1 10*3/uL (ref 0.0–0.7)
Eosinophils Relative: 1.6 % (ref 0.0–5.0)
HCT: 40.2 % (ref 36.0–46.0)
Hemoglobin: 13.4 g/dL (ref 12.0–15.0)
Lymphocytes Relative: 30.1 % (ref 12.0–46.0)
Lymphs Abs: 1.2 10*3/uL (ref 0.7–4.0)
MCHC: 33.5 g/dL (ref 30.0–36.0)
MCV: 92.4 fl (ref 78.0–100.0)
Monocytes Absolute: 0.4 10*3/uL (ref 0.1–1.0)
Monocytes Relative: 9.2 % (ref 3.0–12.0)
Neutro Abs: 2.4 10*3/uL (ref 1.4–7.7)
Neutrophils Relative %: 58.7 % (ref 43.0–77.0)
Platelets: 182 10*3/uL (ref 150.0–400.0)
RBC: 4.34 Mil/uL (ref 3.87–5.11)
RDW: 14.3 % (ref 11.5–15.5)
WBC: 4 10*3/uL (ref 4.0–10.5)

## 2021-06-29 LAB — COMPREHENSIVE METABOLIC PANEL
ALT: 14 U/L (ref 0–35)
AST: 19 U/L (ref 0–37)
Albumin: 3.9 g/dL (ref 3.5–5.2)
Alkaline Phosphatase: 86 U/L (ref 39–117)
BUN: 22 mg/dL (ref 6–23)
CO2: 32 mEq/L (ref 19–32)
Calcium: 9 mg/dL (ref 8.4–10.5)
Chloride: 105 mEq/L (ref 96–112)
Creatinine, Ser: 0.72 mg/dL (ref 0.40–1.20)
GFR: 82.27 mL/min (ref >60.00)
Glucose, Bld: 89 mg/dL (ref 70–99)
Potassium: 4.1 mEq/L (ref 3.5–5.1)
Sodium: 142 mEq/L (ref 135–145)
Total Bilirubin: 0.6 mg/dL (ref 0.2–1.2)
Total Protein: 6.2 g/dL (ref 6.0–8.3)

## 2021-06-29 LAB — LIPID PANEL
Cholesterol: 130 mg/dL (ref 0–200)
HDL: 71.6 mg/dL (ref >39.00)
LDL Cholesterol: 43 mg/dL (ref 0–99)
NonHDL: 58.27
Total CHOL/HDL Ratio: 2
Triglycerides: 77 mg/dL (ref 0.0–149.0)
VLDL: 15.4 mg/dL (ref 0.0–40.0)

## 2021-06-29 LAB — VITAMIN B12: Vitamin B-12: 252 pg/mL (ref 211–911)

## 2021-06-29 LAB — TSH: TSH: 1.56 u[IU]/mL (ref 0.35–5.50)

## 2021-06-30 ENCOUNTER — Ambulatory Visit (INDEPENDENT_AMBULATORY_CARE_PROVIDER_SITE_OTHER): Payer: PPO | Admitting: Family

## 2021-06-30 ENCOUNTER — Other Ambulatory Visit: Payer: Self-pay

## 2021-06-30 ENCOUNTER — Encounter: Payer: Self-pay | Admitting: Family

## 2021-06-30 VITALS — BP 122/78 | HR 88 | Temp 98.0°F | Ht 62.0 in | Wt 168.4 lb

## 2021-06-30 DIAGNOSIS — R1013 Epigastric pain: Secondary | ICD-10-CM | POA: Diagnosis not present

## 2021-06-30 DIAGNOSIS — E041 Nontoxic single thyroid nodule: Secondary | ICD-10-CM

## 2021-06-30 DIAGNOSIS — I10 Essential (primary) hypertension: Secondary | ICD-10-CM | POA: Diagnosis not present

## 2021-06-30 DIAGNOSIS — I700 Atherosclerosis of aorta: Secondary | ICD-10-CM | POA: Diagnosis not present

## 2021-06-30 NOTE — Assessment & Plan Note (Signed)
Chronic, stable.  Continue Repatha 140 mg

## 2021-06-30 NOTE — Assessment & Plan Note (Signed)
Chronic.  No epigastric pain today.  No formal diagnosis.  She is following with  gastroenterology.  We will continue to follow.

## 2021-06-30 NOTE — Assessment & Plan Note (Signed)
Controlled, continue amlodipine '5mg'$ 

## 2021-06-30 NOTE — Progress Notes (Signed)
Denise's scheduled should call to get patient set up for CT Lung Cancer screening.

## 2021-06-30 NOTE — Patient Instructions (Addendum)
Call to make an appointment for Annual Lung cancer screen , due August 2020    CT Chest: [phone]. Let me know if any issues in doing so.  Referral US thyroid.   Let us know if you dont hear back within a week in regards to an appointment being scheduled.   Nice to see you!

## 2021-06-30 NOTE — Progress Notes (Signed)
Subjective:    Patient ID: Valerie Curry, female    DOB: 1946-11-04, 75 y.o.   MRN: [id number]  CC: Valerie Curry is a 75 y.o. female who presents today for follow up.   HPI: Feels well today, no new complaints.  She continues to have intermittent, infrequent epigastric pain. Unchanged.  She is followed with Dr. Bonna Gains this week  Hypertension-compliant with amlodipine 5 mg Hyperlipidemia-compliant with Repatha 140 mg every 2 weeks.   Mammogram up-to-date.  She is due for CT lung cancer screening short interval follow-up from prior exam 01/20/2021   History of thyroid nodules.  No trouble swallowing.  consult with general surgery Dr. Dahlia Byes in regards to actual hernia 06/24/2021.  Continue medication management, deferring any surgical intervention HISTORY:  Past Medical History:  Diagnosis Date   Arthritis    Car sickness    Complication of anesthesia    hard to wake up   Coronary artery disease 11/2018   calcification in LAD and RCA in screening lung scan    Family history of malignant neoplasm of breast    Maternal/paternal aunts and cousin   Hypertension    Left bundle branch block    Lump or mass in breast 2011/2012   benign   Obesity, unspecified    Osteopenia    took Boniva 1 1/2 years in past-doesn't want to restart it   Personal history of tobacco use, presenting hazards to health    PONV (postoperative nausea and vomiting)    Positive PPD    as a child. xray was normal.   Systemic sclerosis [facility])    Past Surgical History:  Procedure Laterality Date   APPENDECTOMY  1961   BREAST BIOPSY Right Nov 2011   benign   BREAST BIOPSY Left Nov 2012   dilated ducts-benign   BUNIONECTOMY Bilateral 2010   x 2   CHOLECYSTECTOMY N/A 10/03/2019   Procedure: LAPAROSCOPIC CHOLECYSTECTOMY;  Surgeon: Fredirick Maudlin, MD;  Location: [facility];  Service: General;  Laterality: N/A;   COLONOSCOPY  8/05   Dr. Vira Agar   COLONOSCOPY WITH PROPOFOL N/A 01/10/2019   Procedure:  COLONOSCOPY WITH PROPOFOL;  Surgeon: Virgel Manifold, MD;  Location: [facility];  Service: Endoscopy;  Laterality: N/A;   DECOMPRESSIVE LUMBAR LAMINECTOMY LEVEL 2 N/A 04/30/2015   Procedure: CENTRAL DECOMPRESSIVE LUMBER LAMINECTOMY WITH POSSIBLE FORAMINOTOMIES L3-L4,L4-L5;  Surgeon: Latanya Maudlin, MD;  [facility];  Service: Orthopedics;  Laterality: N/A;   ESOPHAGOGASTRODUODENOSCOPY (EGD) WITH PROPOFOL N/A 04/07/2021   Procedure: ESOPHAGOGASTRODUODENOSCOPY (EGD) WITH PROPOFOL;  Surgeon: Virgel Manifold, MD;  Location: [place];  Service: Endoscopy;  Laterality: N/A;   POLYPECTOMY  04/07/2021   Procedure: POLYPECTOMY;  Surgeon: Virgel Manifold, MD;  Location: [place];  Service: Endoscopy;;   [place]   Family History  Problem Relation Age of Onset   Lung cancer Father    Heart disease Father 81       Cardiac Arrest   Heart disease Mother    Hypertension Mother    Stroke Mother    Other Brother        blood clots   Other Other        hypercoag-family history   Cancer Maternal Aunt        Breast cancer   Breast cancer Maternal Aunt 21   Breast cancer Paternal Aunt 109   Breast cancer Cousin     Allergies: Patient has no known  allergies. Current Outpatient Medications on File Prior to Visit  Medication Sig Dispense Refill   amLODipine (NORVASC) 5 MG tablet Take 1 tablet (5 mg total) by mouth daily. 30 tablet 11   Ascorbic Acid 500 MG CHEW Chew by mouth daily.     aspirin EC 81 MG tablet Take 81 mg by mouth daily.     Cholecalciferol (VITAMIN D) 50 MCG (2000 UT) CAPS Take 2,000 Units by mouth daily.     Evolocumab (REPATHA SURECLICK) XX123456 MG/ML SOAJ Inject 1 pen into the skin every 14 (fourteen) days. 6 mL 3   meloxicam (MOBIC) 15 MG tablet Take 15 mg by mouth as needed.     Multiple Vitamins-Minerals (PRESERVISION AREDS 2 PO) Take by mouth daily.     No current facility-administered medications on file  prior to visit.    Social History   Tobacco Use   Smoking status: Former    Packs/day: 1.00    Years: 50.00    Pack years: 50.00    Types: Cigarettes    Quit date: 11/29/2014    Years since quitting: 6.5   Smokeless tobacco: Former    Quit date: 04/30/2015  Vaping Use   Vaping Use: Never used  Substance Use Topics   Alcohol use: Yes    Alcohol/week: 0.0 standard drinks    Comment: occasional glassof wine    Drug use: No    Review of Systems  Constitutional:  Negative for chills and fever.  HENT:  Negative for trouble swallowing.   Respiratory:  Negative for cough.   Cardiovascular:  Negative for chest pain and palpitations.  Gastrointestinal:  Negative for abdominal pain (none today, intermittent), nausea and vomiting.     Objective:    BP 122/78 (BP Location: Left Arm, Patient Position: Sitting, Cuff Size: Large)   Pulse 88   Temp 98 F (36.7 C) (Oral)   Ht '5\' 2"'$  (1.575 m)   Wt 168 lb 6.4 oz (76.4 kg)   SpO2 98%   BMI 30.80 kg/m  BP Readings from Last 3 Encounters:  06/30/21 122/78  06/24/21 136/80  05/04/21 130/78   Wt Readings from Last 3 Encounters:  06/30/21 168 lb 6.4 oz (76.4 kg)  06/24/21 168 lb (76.2 kg)  05/04/21 168 lb (76.2 kg)    Physical Exam Vitals reviewed.  Constitutional:      Appearance: She is well-developed.  Eyes:     Conjunctiva/sclera: Conjunctivae normal.  Neck:     Thyroid: No thyroid mass, thyromegaly or thyroid tenderness.  Cardiovascular:     Rate and Rhythm: Normal rate and regular rhythm.     Pulses: Normal pulses.     Heart sounds: Normal heart sounds.  Pulmonary:     Effort: Pulmonary effort is normal.     Breath sounds: Normal breath sounds. No wheezing, rhonchi or rales.  Skin:    General: Skin is warm and dry.  Neurological:     Mental Status: She is alert.  Psychiatric:        Speech: Speech normal.        Behavior: Behavior normal.        Thought Content: Thought content normal.       Assessment & Plan:    Problem List Items Addressed This Visit       Cardiovascular and Mediastinum   Atherosclerosis of aorta (HCC)    Chronic, stable.  Continue Repatha 140 mg       HTN (hypertension)    Controlled, continue amlodipine  $'5mg'Z$          Endocrine   Thyroid nodule - Primary   Relevant Orders   US THYROID     Other   Epigastric pain    Chronic.  No epigastric pain today.  No formal diagnosis.  She is following with  gastroenterology.  We will continue to follow.       Due for CT chest lung cancer screening.  Provided patient with phone number to call and schedule this.  She verbalized understanding.  I am having Valerie Curry maintain her Vitamin D, aspirin EC, meloxicam, Ascorbic Acid, Repatha SureClick, amLODipine, and Multiple Vitamins-Minerals (PRESERVISION AREDS 2 PO).   No orders of the defined types were placed in this encounter.   Return precautions given.   Risks, benefits, and alternatives of the medications and treatment plan prescribed today were discussed, and patient expressed understanding.   Education regarding symptom management and diagnosis given to patient on AVS.  Continue to follow with Burnard Hawthorne, FNP for routine health maintenance.   Valerie Curry and I agreed with plan.   Mable Paris, FNP

## 2021-07-02 ENCOUNTER — Ambulatory Visit: Payer: PPO | Admitting: Gastroenterology

## 2021-07-02 ENCOUNTER — Other Ambulatory Visit: Payer: Self-pay

## 2021-07-02 VITALS — BP 125/79 | HR 86 | Temp 98.2°F | Ht 62.0 in | Wt 166.8 lb

## 2021-07-02 DIAGNOSIS — R197 Diarrhea, unspecified: Secondary | ICD-10-CM | POA: Diagnosis not present

## 2021-07-02 DIAGNOSIS — R109 Unspecified abdominal pain: Secondary | ICD-10-CM

## 2021-07-02 NOTE — Patient Instructions (Signed)
IBgard samples given, take as instructed on back of box

## 2021-07-03 NOTE — Progress Notes (Addendum)
Valerie Antigua, MD [address]  Valerie Curry  [place],  [address]  Main: [phone]  Fax: [phone]   Primary Care Physician: Burnard Hawthorne, FNP   Chief Complaint  Patient presents with   Follow-up    Pt reports she is doing well and denies any new concerns  Follow-up for abdominal pain  HPI: Valerie Curry is a 75 y.o. female here for follow-up of abdominal pain.  Patient states there has been no changes in symptoms, however, does state that abdominal pain episodes are much less frequent.  And states that sometimes, she may feel that the pain is coming on and take some Tylenol and that subsides the pain.  She has not had any severe pain episodes.  No nausea or vomiting.  Good appetite.  Does report having loose stools, but states she will have 2 or 3 loose stools in the morning usually and then no others.  Was evaluated by Dr. Dahlia Byes in regard to her hiatal hernia.  Notes reviewed, from July 2022, and states that pain may not be from hiatal hernia completely, but this may be contributing factor.  As per his note patient is hesitant about surgical intervention therefore medical management was recommended.    ROS: All ROS reviewed and negative except as per HPI   Past Medical History:  Diagnosis Date   Arthritis    Car sickness    Complication of anesthesia    hard to wake up   Coronary artery disease 11/2018   calcification in LAD and RCA in screening lung scan    Family history of malignant neoplasm of breast    Maternal/paternal aunts and cousin   Hypertension    Left bundle branch block    Lump or mass in breast 2011/2012   benign   Obesity, unspecified    Osteopenia    took Boniva 1 1/2 years in past-doesn't want to restart it   Personal history of tobacco use, presenting hazards to health    PONV (postoperative nausea and vomiting)    Positive PPD    as a child. xray was normal.   Systemic sclerosis [facility])     Past Surgical History:   Procedure Laterality Date   APPENDECTOMY  1961   BREAST BIOPSY Right Nov 2011   benign   BREAST BIOPSY Left Nov 2012   dilated ducts-benign   BUNIONECTOMY Bilateral 2010   x 2   CHOLECYSTECTOMY N/A 10/03/2019   Procedure: LAPAROSCOPIC CHOLECYSTECTOMY;  Surgeon: Fredirick Maudlin, MD;  Location: [facility];  Service: General;  Laterality: N/A;   COLONOSCOPY  8/05   Dr. Vira Agar   COLONOSCOPY WITH PROPOFOL N/A 01/10/2019   Procedure: COLONOSCOPY WITH PROPOFOL;  Surgeon: Virgel Manifold, MD;  Location: [facility];  Service: Endoscopy;  Laterality: N/A;   DECOMPRESSIVE LUMBAR LAMINECTOMY LEVEL 2 N/A 04/30/2015   Procedure: CENTRAL DECOMPRESSIVE LUMBER LAMINECTOMY WITH POSSIBLE FORAMINOTOMIES L3-L4,L4-L5;  Surgeon: Latanya Maudlin, MD;  [facility];  Service: Orthopedics;  Laterality: N/A;   ESOPHAGOGASTRODUODENOSCOPY (EGD) WITH PROPOFOL N/A 04/07/2021   Procedure: ESOPHAGOGASTRODUODENOSCOPY (EGD) WITH PROPOFOL;  Surgeon: Virgel Manifold, MD;  Location: [place];  Service: Endoscopy;  Laterality: N/A;   POLYPECTOMY  04/07/2021   Procedure: POLYPECTOMY;  Surgeon: Virgel Manifold, MD;  Location: [place];  Service: Endoscopy;;   [place]    Prior to Admission medications   Medication Sig Start Date End Date Taking? Authorizing Provider  amLODipine (NORVASC) 5 MG tablet Take 1 tablet (5 mg total) by mouth daily. 02/12/21  Yes Agbor-Etang, Aaron Edelman, MD  Ascorbic Acid 500 MG CHEW Chew by mouth daily.   Yes [provider]  aspirin EC 81 MG tablet Take 81 mg by mouth daily.   Yes [provider]  Cholecalciferol (VITAMIN D) 50 MCG (2000 UT) CAPS Take 2,000 Units by mouth daily.   Yes [provider]  Evolocumab (REPATHA SURECLICK) [id number] MG/ML SOAJ Inject 1 pen into the skin every 14 (fourteen) days. 11/20/20  Yes Agbor-Etang, Aaron Edelman, MD  meloxicam (MOBIC) 15 MG tablet Take 15 mg by mouth as needed. 06/02/20   Yes [provider]  Multiple Vitamins-Minerals (PRESERVISION AREDS 2 PO) Take by mouth daily.   Yes [provider]    Family History  Problem Relation Age of Onset   Lung cancer Father    Heart disease Father 41       Cardiac Arrest   Heart disease Mother    Hypertension Mother    Stroke Mother    Other Brother        blood clots   Other Other        hypercoag-family history   Cancer Maternal Aunt        Breast cancer   Breast cancer Maternal Aunt 106   Breast cancer Paternal Aunt 61   Breast cancer Cousin      Social History   Tobacco Use   Smoking status: Former    Packs/day: 1.00    Years: 50.00    Pack years: 50.00    Types: Cigarettes    Quit date: 11/29/2014    Years since quitting: 6.5   Smokeless tobacco: Former    Quit date: 04/30/2015  Vaping Use   Vaping Use: Never used  Substance Use Topics   Alcohol use: Yes    Alcohol/week: 0.0 standard drinks    Comment: occasional glassof wine    Drug use: No    Allergies as of 07/02/2021   (No Known Allergies)    Physical Examination:  Constitutional: General:   Alert,  Well-developed, well-nourished, pleasant and cooperative in NAD BP 125/79   Pulse 86   Temp 98.2 F (36.8 C) (Oral)   Ht '5\' 2"'$  (1.575 m)   Wt 166 lb 12.8 oz (75.7 kg)   BMI 30.51 kg/m   Respiratory: Normal respiratory effort  Gastrointestinal:  Soft, non-tender and non-distended without masses, hepatosplenomegaly or hernias noted.  No guarding or rebound tenderness.     Cardiac: No clubbing or edema.  No cyanosis. Normal posterior tibial pedal pulses noted.  Psych:  Alert and cooperative. Normal mood and affect.  Musculoskeletal:  Normal gait. Head normocephalic, atraumatic. Symmetrical without gross deformities. 5/5 Lower extremity strength bilaterally.  Skin: Warm. Intact without significant lesions or rashes. No jaundice.  Neck: Supple, trachea midline  Lymph: No cervical lymphadenopathy  Psych:  Alert and  oriented x3, Alert and cooperative. Normal mood and affect.  Labs: CMP     Component Value Date/Time   NA 142 06/29/2021 0918   NA 140 07/01/2020 0000   K 4.1 06/29/2021 0918   CL 105 06/29/2021 0918   CO2 32 06/29/2021 0918   GLUCOSE 89 06/29/2021 0918   BUN 22 06/29/2021 0918   BUN 20 07/01/2020 0000   CREATININE 0.72 06/29/2021 0918   CALCIUM 9.0 06/29/2021 0918   PROT 6.2 06/29/2021 0918   PROT 6.2 07/01/2020 0000   ALBUMIN 3.9 06/29/2021 0918  ALBUMIN 4.1 07/01/2020 0000   AST 19 06/29/2021 0918   ALT 14 06/29/2021 0918   ALKPHOS 86 06/29/2021 0918   BILITOT 0.6 06/29/2021 0918   BILITOT 0.6 07/01/2020 0000   GFRNONAA >60 08/06/2020 1344   GFRAA >60 08/06/2020 1344   Lab Results  Component Value Date   WBC 4.0 06/29/2021   HGB 13.4 06/29/2021   HCT 40.2 06/29/2021   MCV 92.4 06/29/2021   PLT 182.0 06/29/2021    Imaging Studies: Gastric emptying study July 2022 normal  Upper GI series June 2022, small hiatal hernia, moderate gastroesophageal reflux  Assessment and Plan:   Valerie Curry is a 75 y.o. y/o female here for follow-up of very intermittent episodes of abdominal pain, that have become less frequent and improved after Tylenol use as per the patient, with tertiary care referral for work-up at the same for possible sphincter of Oddi dysfunction, did not recommend ERCP  Abdominal pain (chronic, very intermittent intermittent, improved)  Symptoms have continued to improve at this time, and patient does not have any alarm symptoms to indicate further interventions  She underwent EGD recently, with small gastric erosions, and evaluation by surgery for evaluation of hiatal hernia repair.  Most recent imaging including esophagram showed small hiatal hernia that we had previously known from her EGD.  Gastric emptying study was also reassuring.  Labs including CBC CMP recently very reassuring as well  She is off PPI since it was not helping  IB Gard  samples also given to try during the time of her next pain episode to see if it helps as well as symptoms may be due to abdominal spasm and patient is willing to try this  She is reassured by less frequent symptoms and improvement with Tylenol.  She may have a component of musculoskeletal pain given improvement with Tylenol.  Diarrhea (chronic, stable) Her loose stools which are mostly occurring in the morning have previously been attributed to pelvic floor dysfunction, given previous history of bladder prolapse   However, I will check fecal pancreatic elastase  Would recommend pelvic floor physical therapy for pelvic floor exercises    Dr Valerie Curry

## 2021-07-06 DIAGNOSIS — R197 Diarrhea, unspecified: Secondary | ICD-10-CM | POA: Diagnosis not present

## 2021-07-07 ENCOUNTER — Encounter: Payer: Self-pay | Admitting: Family

## 2021-07-07 ENCOUNTER — Other Ambulatory Visit: Payer: Self-pay | Admitting: Family

## 2021-07-07 DIAGNOSIS — M62830 Muscle spasm of back: Secondary | ICD-10-CM

## 2021-07-07 MED ORDER — CYCLOBENZAPRINE HCL 10 MG PO TABS: 5.0000 mg | ORAL_TABLET | Freq: Every evening | ORAL | 0 refills | Status: DC | PRN

## 2021-07-08 LAB — PANCREATIC ELASTASE, FECAL: Pancreatic Elastase, Fecal: 228 ug Elast./g (ref >200)

## 2021-07-14 DIAGNOSIS — M545 Low back pain, unspecified: Secondary | ICD-10-CM | POA: Diagnosis not present

## 2021-07-15 ENCOUNTER — Ambulatory Visit
Admission: RE | Admit: 2021-07-15 | Discharge: 2021-07-15 | Disposition: A | Discharge status: Home / Self Care | Payer: PPO | Source: Ambulatory Visit | Attending: Family | Admitting: Family

## 2021-07-15 ENCOUNTER — Other Ambulatory Visit: Payer: Self-pay

## 2021-07-15 ENCOUNTER — Encounter: Payer: Self-pay | Admitting: Family

## 2021-07-15 ENCOUNTER — Other Ambulatory Visit: Payer: Self-pay | Admitting: Family

## 2021-07-15 DIAGNOSIS — E041 Nontoxic single thyroid nodule: Secondary | ICD-10-CM | POA: Insufficient documentation

## 2021-07-15 DIAGNOSIS — E042 Nontoxic multinodular goiter: Secondary | ICD-10-CM | POA: Diagnosis not present

## 2021-08-04 DIAGNOSIS — M5459 Other low back pain: Secondary | ICD-10-CM | POA: Diagnosis not present

## 2021-08-07 ENCOUNTER — Telehealth: Payer: Self-pay

## 2021-08-07 DIAGNOSIS — E785 Hyperlipidemia, unspecified: Secondary | ICD-10-CM

## 2021-08-07 NOTE — Telephone Encounter (Signed)
Called and spoke with patient. I saw that Dr. Garen Lah had taken her off Rosuvastatin for elevated LFTs. Patient was wondering if it could have been other medications she was on at the time for her stomach. She would be willing to try that again, or Zetia. I also gave her Nexlizet to look up in her formulary for possible coverage. I informed her that I would speak with Dr. Garen Lah on Monday 08/10/21 for his recommendations and give her a call back. Patient agreed with plan   [place] Triage (supporting Kate Sable, MD) 6 hours ago (9:10 AM)   Following up on my previous message on Repath. [facility]  [place], [place] E, RPH-CPP  You 2 days ago   We don't consistently have samples of Repatha, her plan will reset in January and her copay should return to normal at that time. Cheaper alternatives to Repatha in the mean time would be statins or ezetimibe. I see rosuvastatin '10mg'$  daily listed under previously prescribed meds - if she didn't tolerate that, could try a lower dose or try pravastatin instead which is typically tolerated well.    You routed conversation to [facility] 2 days ago   Parris-Godley, Izell Morrison, RN routed conversation to [facility] 2 days ago   [place] Triage (supporting Kate Sable, MD) 3 days ago   I have fallen into the "Coverage Gap" with my prescription for  Repatha Srclk. My cost is $400.00/6. Are there any samples available for this medication? If not is there an alternative for this medication? Thank you for your help. [facility]

## 2021-08-10 MED ORDER — ROSUVASTATIN CALCIUM 20 MG PO TABS: 20.0000 mg | ORAL_TABLET | Freq: Every day | ORAL | 3 refills | Status: DC

## 2021-08-10 NOTE — Telephone Encounter (Signed)
Spoke with Dr. Garen Lah and he recommended we start Rosuvastatin 20 MG once a day, and then have patient come in for a LFT lab in 1 month.   Called patient and informed her of the recommendation. Patient agreed with the plan. Scheduled office lab draw for 09/09/21 at 0800. Patient confirmed that she will be fasting before she comes in.

## 2021-08-10 NOTE — Addendum Note (Signed)
Addended by: Kavin Leech on: 08/10/2021 02:31 PM   Modules accepted: Orders

## 2021-09-01 ENCOUNTER — Other Ambulatory Visit: Payer: Self-pay | Admitting: Gastroenterology

## 2021-09-01 ENCOUNTER — Ambulatory Visit: Payer: PPO | Admitting: Gastroenterology

## 2021-09-01 ENCOUNTER — Other Ambulatory Visit: Payer: Self-pay

## 2021-09-01 VITALS — BP 129/84 | HR 76 | Temp 98.1°F | Wt 167.6 lb

## 2021-09-01 DIAGNOSIS — R109 Unspecified abdominal pain: Secondary | ICD-10-CM | POA: Diagnosis not present

## 2021-09-01 DIAGNOSIS — R748 Abnormal levels of other serum enzymes: Secondary | ICD-10-CM

## 2021-09-02 DIAGNOSIS — R109 Unspecified abdominal pain: Secondary | ICD-10-CM | POA: Diagnosis not present

## 2021-09-02 LAB — COMPREHENSIVE METABOLIC PANEL
ALT: 321 IU/L — ABNORMAL HIGH (ref 0–32)
AST: 87 IU/L — ABNORMAL HIGH (ref 0–40)
Albumin/Globulin Ratio: 2 (ref 1.2–2.2)
Albumin: 4.3 g/dL (ref 3.7–4.7)
Alkaline Phosphatase: 510 IU/L — ABNORMAL HIGH (ref 44–121)
BUN/Creatinine Ratio: 25 (ref 12–28)
BUN: 17 mg/dL (ref 8–27)
Bilirubin Total: 0.6 mg/dL (ref 0.0–1.2)
CO2: 24 mmol/L (ref 20–29)
Calcium: 9.5 mg/dL (ref 8.7–10.3)
Chloride: 102 mmol/L (ref 96–106)
Creatinine, Ser: 0.69 mg/dL (ref 0.57–1.00)
Globulin, Total: 2.1 g/dL (ref 1.5–4.5)
Glucose: 90 mg/dL (ref 70–99)
Potassium: 4.2 mmol/L (ref 3.5–5.2)
Sodium: 141 mmol/L (ref 134–144)
Total Protein: 6.4 g/dL (ref 6.0–8.5)
eGFR: 91 mL/min/{1.73_m2} (ref >59)

## 2021-09-02 LAB — CBC
Hematocrit: 40.5 % (ref 34.0–46.6)
Hemoglobin: 13.5 g/dL (ref 11.1–15.9)
MCH: 31.2 pg (ref 26.6–33.0)
MCHC: 33.3 g/dL (ref 31.5–35.7)
MCV: 94 fL (ref 79–97)
Platelets: 236 10*3/uL (ref 150–450)
RBC: 4.33 x10E6/uL (ref 3.77–5.28)
RDW: 13 % (ref 11.7–15.4)
WBC: 5.3 10*3/uL (ref 3.4–10.8)

## 2021-09-02 LAB — CREATININE, URINE, RANDOM: Creatinine, Urine: 47.4 mg/dL

## 2021-09-02 NOTE — Progress Notes (Signed)
Vonda Antigua, MD [address]  [place], [place] [address]  Main: [phone]  Fax: [phone]   Primary Care Physician: Burnard Hawthorne, FNP   Chief Complaint  Patient presents with   Follow-up    Feeling weak, nauseous has vomited x 3, abd pain,     HPI: Valerie Curry is a 75 y.o. female here for follow-up of abdominal pain.  Patient continues to have very episodic symptoms of abdominal pain, with the last episode occurring about 5 days ago and lasting for 10 hours.  It starts in the umbilical region to epigastric region and radiates up to the xiphoid notch.  The pain is about 8/10, dull, nonradiating.  Unrelated to meals.  Once she vomits, the pain subsides.  These episodes are occurring far and few in between, and anywhere from once a month, to less frequently.  In between episodes she is either normal or may have mild epigastric discomfort that she takes Tylenol for which helps.  During the severe episodes like the one described above, she has taken Tylenol that did not help last time.  She also try taking IBgard that did not help.  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  ERCP was discussed but not planned.  She has had evaluation with Dr. Dahlia Byes given small hiatal hernia and conservative management has been recommended so far.  Gastric emptying study, MRCP has been unrevealing.   ROS: All ROS reviewed and negative except as per HPI   Past Medical History:  Diagnosis Date   Arthritis    Car sickness    Complication of anesthesia    hard to wake up   Coronary artery disease 11/2018   calcification in LAD and RCA in screening lung scan    Family history of malignant neoplasm of breast    Maternal/paternal aunts and cousin   Hypertension    Left bundle branch block    Lump or mass in breast 2011/2012   benign   Obesity, unspecified     Osteopenia    took Boniva 1 1/2 years in past-doesn't want to restart it   Personal history of tobacco use, presenting hazards to health    PONV (postoperative nausea and vomiting)    Positive PPD    as a child. xray was normal.   Systemic sclerosis [facility])     Past Surgical History:  Procedure Laterality Date   APPENDECTOMY  1961   BREAST BIOPSY Right Nov 2011   benign   BREAST BIOPSY Left Nov 2012   dilated ducts-benign   BUNIONECTOMY Bilateral 2010   x 2   CHOLECYSTECTOMY N/A 10/03/2019   Procedure: LAPAROSCOPIC CHOLECYSTECTOMY;  Surgeon: Fredirick Maudlin, MD;  Location: [facility] ORS;  Service: General;  Laterality: N/A;   COLONOSCOPY  8/05   Dr. Vira Agar   COLONOSCOPY WITH PROPOFOL N/A 01/10/2019   Procedure: COLONOSCOPY WITH PROPOFOL;  Surgeon: Virgel Manifold, MD;  Location: [facility]COPY;  Service: Endoscopy;  Laterality: N/A;   DECOMPRESSIVE LUMBAR LAMINECTOMY LEVEL 2 N/A 04/30/2015   Procedure: CENTRAL DECOMPRESSIVE LUMBER LAMINECTOMY WITH POSSIBLE FORAMINOTOMIES L3-L4,L4-L5;  Surgeon: Latanya Maudlin, MD;  Location: [facility];  Service: Orthopedics;  Laterality: N/A;   ESOPHAGOGASTRODUODENOSCOPY (EGD) WITH PROPOFOL N/A 04/07/2021   Procedure: ESOPHAGOGASTRODUODENOSCOPY (EGD) WITH PROPOFOL;  Surgeon: Virgel Manifold, MD;  Location: [place];  Service: Endoscopy;  Laterality: N/A;  POLYPECTOMY  04/07/2021   Procedure: POLYPECTOMY;  Surgeon: Virgel Manifold, MD;  Location: [place];  Service: Endoscopy;;   [place]    Prior to Admission medications   Medication Sig Start Date End Date Taking? Authorizing Provider  amLODipine (NORVASC) 5 MG tablet Take 1 tablet (5 mg total) by mouth daily. 02/12/21  Yes Agbor-Etang, Aaron Edelman, MD  Ascorbic Acid 500 MG CHEW Chew by mouth daily.   Yes [provider]  aspirin EC 81 MG tablet Take 81 mg by mouth daily.   Yes [provider]  Cholecalciferol (VITAMIN D)  50 MCG (2000 UT) CAPS Take 2,000 Units by mouth daily.   Yes [provider]  cyclobenzaprine (FLEXERIL) 10 MG tablet Take 0.5-1 tablets (5-10 mg total) by mouth at bedtime as needed for muscle spasms. 07/07/21  Yes Burnard Hawthorne, FNP  meloxicam (MOBIC) 15 MG tablet Take 15 mg by mouth as needed. 06/02/20  Yes [provider]  Multiple Vitamins-Minerals (PRESERVISION AREDS 2 PO) Take by mouth daily.   Yes [provider]  rosuvastatin (CRESTOR) 20 MG tablet Take 1 tablet (20 mg total) by mouth daily. 08/10/21 11/08/21 Yes Kate Sable, MD    Family History  Problem Relation Age of Onset   Lung cancer Father    Heart disease Father 31       Cardiac Arrest   Heart disease Mother    Hypertension Mother    Stroke Mother    Other Brother        blood clots   Other Other        hypercoag-family history   Cancer Maternal Aunt        Breast cancer   Breast cancer Maternal Aunt 34   Breast cancer Paternal Aunt 75   Breast cancer Cousin      Social History   Tobacco Use   Smoking status: Former    Packs/day: 1.00    Years: 50.00    Pack years: 50.00    Types: Cigarettes    Quit date: 11/29/2014    Years since quitting: 6.7   Smokeless tobacco: Former    Quit date: 04/30/2015  Vaping Use   Vaping Use: Never used  Substance Use Topics   Alcohol use: Yes    Alcohol/week: 0.0 standard drinks    Comment: occasional glassof wine    Drug use: No    Allergies as of 09/01/2021   (No Known Allergies)    Physical Examination:  Constitutional: General:   Alert,  Well-developed, well-nourished, pleasant and cooperative in NAD BP 129/84   Pulse 76   Temp 98.1 F (36.7 C) (Oral)   Wt 167 lb 9.6 oz (76 kg)   BMI 30.65 kg/m   Respiratory: Normal respiratory effort  Gastrointestinal:  Soft, non-tender and non-distended without masses, hepatosplenomegaly or hernias noted.  No guarding or rebound tenderness.     Cardiac: No clubbing or edema.  No  cyanosis. Normal posterior tibial pedal pulses noted.  Psych:  Alert and cooperative. Normal mood and affect.  Musculoskeletal:  Normal gait. Head normocephalic, atraumatic. Symmetrical without gross deformities. 5/5 Lower extremity strength bilaterally.  Skin: Warm. Intact without significant lesions or rashes. No jaundice.  Neck: Supple, trachea midline  Lymph: No cervical lymphadenopathy  Psych:  Alert and oriented x3, Alert and cooperative. Normal mood and affect.  Labs: CMP     Component Value Date/Time   NA 141 09/01/2021 1536  K 4.2 09/01/2021 1536   CL 102 09/01/2021 1536   CO2 24 09/01/2021 1536   GLUCOSE 90 09/01/2021 1536   GLUCOSE 89 06/29/2021 0918   BUN 17 09/01/2021 1536   CREATININE 0.69 09/01/2021 1536   CALCIUM 9.5 09/01/2021 1536   PROT 6.4 09/01/2021 1536   ALBUMIN 4.3 09/01/2021 1536   AST 87 (H) 09/01/2021 1536   ALT 321 (H) 09/01/2021 1536   ALKPHOS 510 (H) 09/01/2021 1536   BILITOT 0.6 09/01/2021 1536   GFRNONAA >60 08/06/2020 1344   GFRAA >60 08/06/2020 1344   Lab Results  Component Value Date   WBC 5.3 09/01/2021   HGB 13.5 09/01/2021   HCT 40.5 09/01/2021   MCV 94 09/01/2021   PLT 236 09/01/2021    Imaging Studies:   Assessment and Plan:   Valerie Curry is a 75 y.o. y/o female here for follow-up of continued intermittent abdominal pain symptoms  Given her very intermittent symptoms, I do not believe that this is related to functional symptoms since the last 10 hours and she is mostly normal to have very mild symptoms but in between attacks.  I will initiate work-up for acute intermittent porphyria, which is best done at the time of an acute episode.  I will place standing orders for urine porphyrin testing and have advised her to go to the lab The symptoms do start.  However, given that she did have some symptoms that occurred 5 days ago, I will also obtain labs at this time  The urine testing that I ordered today is still  pending, but labs done today do show significant elevation in alk phos and transaminases.  It is important to note that her liver enzymes have previously been elevated at the time of acute attacks and this was the reason for evaluation of sphincter of Oddi manometry/dysfunction at [facility].  Given significant elevation in liver enzymes noted again, nature of Oddi dysfunction still remains of this potential.  She also meets the Rome IV criteria used for a Sphincter of Oddi dysfunction diagnosis.  I will obtain HIDA scan at this time.  Patient has history of cholecystectomy.  As per sphincter of Oddi dysfunction algorithm for diagnosis, if this is positive, manometry would be the next step in the algorithm.  I will also reach out to [facility] that saw her and discuss possible ERCP to discuss her recurrent symptoms and elevated liver enzymes again  I will continue with acute intermittent porphyria work-up as well, although this is looking less likely given otherwise normal electrolytes, and looking more like possible sphincter of Oddi dysfunction  She had been referred by Dr. Adora Fridge to [facility] for pH study, which I think is reasonable.  Because of this is positive, she may benefit from hiatal hernia repair.  We will contact [place] to assist with this appointment since she has not heard from them  Dr Vonda Antigua

## 2021-09-03 ENCOUNTER — Telehealth: Payer: Self-pay

## 2021-09-03 DIAGNOSIS — R1084 Generalized abdominal pain: Secondary | ICD-10-CM

## 2021-09-03 DIAGNOSIS — R748 Abnormal levels of other serum enzymes: Secondary | ICD-10-CM

## 2021-09-03 DIAGNOSIS — E785 Hyperlipidemia, unspecified: Secondary | ICD-10-CM

## 2021-09-03 MED ORDER — EZETIMIBE 10 MG PO TABS: 10.0000 mg | ORAL_TABLET | Freq: Every day | ORAL | 3 refills | Status: DC

## 2021-09-03 NOTE — Telephone Encounter (Signed)
Per Dr Bonna Gains  Please let her know I am recommending a HIDA scan because her liver enzymes are high again. This is to evaluate for sphincter of oddi dysfunction again.   I will try to reach out to the GI she saw at [facility] as well.   Please schedule for HIDA scan and continue with these labs we had planned on  She will need hepatic function panel today for elevated liver enzymes

## 2021-09-03 NOTE — Telephone Encounter (Signed)
Called patient and informed her of Dr. Thereasa Solo recommendations as documented below. She will pick up her prescription and start on Zetia. We are not able to schedule her labs 4 weeks out yet, I will call her in 1-2 weeks when our schedule is out to schedule the Lipid and Lft labs. Need to schedule patient for Tues 11/1 or Thurs 11/3 and link the LFT and Lipid Panel in active requests.

## 2021-09-03 NOTE — Telephone Encounter (Signed)
-----  Message from Kate Sable, MD sent at 09/03/2021 12:55 PM EDT ----- Hi Dr. Bonna Gains,  Thanks for reaching out.  I have not seen this kind of elevation with Crestor before.  We will plan to stop Crestor,  and start Zetia instead.  We will see if this helps to improve her liver enzymes.  Valerie Curry, Please stop Crestor and start Zetia 10 mg daily.  Repeat fasting lipid profile and LFTs in 4 weeks.  Thank you BA   ----- Message ----- From: Virgel Manifold, MD Sent: 09/03/2021  10:35 AM EDT To: Kate Sable, MD  Hi Dr. Mylo Red,  I am seeing this patient for intermittent abdominal pain.  She had an episode of abdominal pain last week and her liver enzymes are high.  She states that she was recently restarted on Crestor.  However, with the level of liver enzyme elevation, especially her alk phos, I am not sure if Crestor would explain this.  She has stopped her medication on her own.  Have you seen this level of elevation from Crestor?  I am also getting other work-up done including HIDA scan, repeating liver enzymes, getting a second opinion at [facility] for sphincter of Oddi dysfunction as well.  Can you also reach out to her in regard to her cholesterol medications.  Thank you

## 2021-09-04 LAB — HEPATIC FUNCTION PANEL
ALT: 168 IU/L — ABNORMAL HIGH (ref 0–32)
AST: 36 IU/L (ref 0–40)
Albumin: 4.2 g/dL (ref 3.7–4.7)
Alkaline Phosphatase: 377 IU/L — ABNORMAL HIGH (ref 44–121)
Bilirubin Total: 0.4 mg/dL (ref 0.0–1.2)
Bilirubin, Direct: 0.23 mg/dL (ref 0.00–0.40)
Total Protein: 6.3 g/dL (ref 6.0–8.5)

## 2021-09-04 NOTE — Addendum Note (Signed)
Addended by: Lurlean Nanny on: 09/04/2021 09:15 AM   Modules accepted: Orders

## 2021-09-05 LAB — PORPHYRINS, FRACTIONATED, RANDOM URINE
Coproporphyrin (CP) I: 11 ug/L (ref 0–15)
Coproporphyrin (CP) III: 22 ug/L (ref 0–49)
Heptacarboxyl (7-CP): 1 ug/L (ref 0–2)
Hexacarboxyl (6-CP): <1 ug/L (ref 0–1)
Pentacarboxyl (5-CP): <1 ug/L (ref 0–2)
Uroporphyrins (UP): 8 ug/L (ref 0–20)

## 2021-09-05 LAB — PORPHOBILINOGEN, RANDOM URINE: Porphobilinogen, Rand Ur: 0.8 mg/L (ref 0.0–2.0)

## 2021-09-07 ENCOUNTER — Encounter: Payer: Self-pay | Admitting: Gastroenterology

## 2021-09-08 ENCOUNTER — Other Ambulatory Visit: Payer: Self-pay

## 2021-09-08 ENCOUNTER — Ambulatory Visit (INDEPENDENT_AMBULATORY_CARE_PROVIDER_SITE_OTHER): Payer: PPO

## 2021-09-08 DIAGNOSIS — Z23 Encounter for immunization: Secondary | ICD-10-CM | POA: Diagnosis not present

## 2021-09-09 ENCOUNTER — Other Ambulatory Visit: Payer: PPO

## 2021-09-10 NOTE — Telephone Encounter (Signed)
Scheduled for 11/03 - will need order for LFT placed - unless we are able to use order from Dr Bonna Gains

## 2021-09-11 DIAGNOSIS — R748 Abnormal levels of other serum enzymes: Secondary | ICD-10-CM | POA: Diagnosis not present

## 2021-09-12 LAB — HEPATIC FUNCTION PANEL
ALT: 475 IU/L — ABNORMAL HIGH (ref 0–32)
AST: 313 IU/L — ABNORMAL HIGH (ref 0–40)
Albumin: 4.5 g/dL (ref 3.7–4.7)
Alkaline Phosphatase: 582 IU/L — ABNORMAL HIGH (ref 44–121)
Bilirubin Total: 0.8 mg/dL (ref 0.0–1.2)
Bilirubin, Direct: 0.45 mg/dL — ABNORMAL HIGH (ref 0.00–0.40)
Total Protein: 6.7 g/dL (ref 6.0–8.5)

## 2021-09-14 ENCOUNTER — Other Ambulatory Visit: Payer: Self-pay | Admitting: Gastroenterology

## 2021-09-14 ENCOUNTER — Ambulatory Visit
Admission: RE | Admit: 2021-09-14 | Discharge: 2021-09-14 | Disposition: A | Discharge status: Home / Self Care | Payer: PPO | Source: Ambulatory Visit | Attending: Gastroenterology | Admitting: Gastroenterology

## 2021-09-14 DIAGNOSIS — R1084 Generalized abdominal pain: Secondary | ICD-10-CM | POA: Insufficient documentation

## 2021-09-14 DIAGNOSIS — R748 Abnormal levels of other serum enzymes: Secondary | ICD-10-CM

## 2021-09-14 DIAGNOSIS — Z9049 Acquired absence of other specified parts of digestive tract: Secondary | ICD-10-CM | POA: Diagnosis not present

## 2021-09-14 DIAGNOSIS — R945 Abnormal results of liver function studies: Secondary | ICD-10-CM | POA: Diagnosis not present

## 2021-09-14 MED ORDER — TECHNETIUM TC 99M MEBROFENIN IV KIT
5.0000 | PACK | Freq: Once | INTRAVENOUS | Status: AC | PRN
  Administered 2021-09-14: 4.71 via INTRAVENOUS

## 2021-09-17 ENCOUNTER — Encounter: Payer: Self-pay | Admitting: General Surgery

## 2021-09-17 DIAGNOSIS — R748 Abnormal levels of other serum enzymes: Secondary | ICD-10-CM | POA: Diagnosis not present

## 2021-09-18 LAB — HEPATIC FUNCTION PANEL
ALT: 128 IU/L — ABNORMAL HIGH (ref 0–32)
AST: 33 IU/L (ref 0–40)
Albumin: 4.4 g/dL (ref 3.7–4.7)
Alkaline Phosphatase: 421 IU/L — ABNORMAL HIGH (ref 44–121)
Bilirubin Total: 0.5 mg/dL (ref 0.0–1.2)
Bilirubin, Direct: 0.27 mg/dL (ref 0.00–0.40)
Total Protein: 6.8 g/dL (ref 6.0–8.5)

## 2021-09-28 DIAGNOSIS — K834 Spasm of sphincter of Oddi: Secondary | ICD-10-CM | POA: Diagnosis not present

## 2021-09-28 DIAGNOSIS — R748 Abnormal levels of other serum enzymes: Secondary | ICD-10-CM | POA: Diagnosis not present

## 2021-09-28 DIAGNOSIS — R109 Unspecified abdominal pain: Secondary | ICD-10-CM | POA: Diagnosis not present

## 2021-09-28 DIAGNOSIS — K838 Other specified diseases of biliary tract: Secondary | ICD-10-CM | POA: Diagnosis not present

## 2021-09-28 DIAGNOSIS — R9389 Abnormal findings on diagnostic imaging of other specified body structures: Secondary | ICD-10-CM | POA: Diagnosis not present

## 2021-10-01 ENCOUNTER — Other Ambulatory Visit (INDEPENDENT_AMBULATORY_CARE_PROVIDER_SITE_OTHER): Payer: PPO

## 2021-10-01 ENCOUNTER — Other Ambulatory Visit: Payer: Self-pay

## 2021-10-01 DIAGNOSIS — E785 Hyperlipidemia, unspecified: Secondary | ICD-10-CM | POA: Diagnosis not present

## 2021-10-02 LAB — LIPID PANEL
Chol/HDL Ratio: 2.2 ratio (ref 0.0–4.4)
Cholesterol, Total: 149 mg/dL (ref 100–199)
HDL: 67 mg/dL (ref >39)
LDL Chol Calc (NIH): 70 mg/dL (ref 0–99)
Triglycerides: 56 mg/dL (ref 0–149)
VLDL Cholesterol Cal: 12 mg/dL (ref 5–40)

## 2021-10-02 LAB — HEPATIC FUNCTION PANEL
ALT: 14 IU/L (ref 0–32)
AST: 20 IU/L (ref 0–40)
Albumin: 4.2 g/dL (ref 3.7–4.7)
Alkaline Phosphatase: 172 IU/L — ABNORMAL HIGH (ref 44–121)
Bilirubin Total: 0.5 mg/dL (ref 0.0–1.2)
Bilirubin, Direct: 0.22 mg/dL (ref 0.00–0.40)
Total Protein: 5.9 g/dL — ABNORMAL LOW (ref 6.0–8.5)

## 2021-10-08 DIAGNOSIS — K834 Spasm of sphincter of Oddi: Secondary | ICD-10-CM | POA: Diagnosis not present

## 2021-10-08 DIAGNOSIS — R748 Abnormal levels of other serum enzymes: Secondary | ICD-10-CM | POA: Diagnosis not present

## 2021-10-08 DIAGNOSIS — Z9049 Acquired absence of other specified parts of digestive tract: Secondary | ICD-10-CM | POA: Diagnosis not present

## 2021-10-08 DIAGNOSIS — E785 Hyperlipidemia, unspecified: Secondary | ICD-10-CM | POA: Diagnosis not present

## 2021-10-08 DIAGNOSIS — K5080 Crohn's disease of both small and large intestine without complications: Secondary | ICD-10-CM | POA: Diagnosis not present

## 2021-10-08 DIAGNOSIS — K769 Liver disease, unspecified: Secondary | ICD-10-CM | POA: Diagnosis not present

## 2021-10-08 DIAGNOSIS — I10 Essential (primary) hypertension: Secondary | ICD-10-CM | POA: Diagnosis not present

## 2021-10-08 DIAGNOSIS — K838 Other specified diseases of biliary tract: Secondary | ICD-10-CM | POA: Diagnosis not present

## 2021-10-08 DIAGNOSIS — R932 Abnormal findings on diagnostic imaging of liver and biliary tract: Secondary | ICD-10-CM | POA: Diagnosis not present

## 2021-10-08 DIAGNOSIS — K8050 Calculus of bile duct without cholangitis or cholecystitis without obstruction: Secondary | ICD-10-CM | POA: Diagnosis not present

## 2021-10-30 ENCOUNTER — Encounter: Payer: Self-pay | Admitting: Family

## 2021-11-03 ENCOUNTER — Encounter: Payer: Self-pay | Admitting: Gastroenterology

## 2021-11-03 ENCOUNTER — Ambulatory Visit (INDEPENDENT_AMBULATORY_CARE_PROVIDER_SITE_OTHER): Payer: PPO | Admitting: Gastroenterology

## 2021-11-03 VITALS — BP 130/81 | HR 88 | Temp 98.4°F | Wt 166.0 lb

## 2021-11-03 DIAGNOSIS — R748 Abnormal levels of other serum enzymes: Secondary | ICD-10-CM | POA: Diagnosis not present

## 2021-11-03 DIAGNOSIS — R109 Unspecified abdominal pain: Secondary | ICD-10-CM | POA: Diagnosis not present

## 2021-11-03 NOTE — Progress Notes (Signed)
Valerie Antigua, MD [address]  [place]  [place], [place] [address]  Main: [phone]  Fax: [phone]   Primary Care Physician: Burnard Hawthorne, FNP   Chief complaint: Abdominal pain   HPI: Valerie Curry is a 75 y.o. female with history of intermittent abdominal pain, very episodic in nature, here for follow-up.  Since last visit patient has undergone ERCP at [facility] on 10/08/2021 for possible sphincter of Oddi dysfunction.  Impression:            - The major papilla appeared normal.                         - The entire main bile duct was dilated and tortuous.                         - The patient has had a cholecystectomy.                         - Choledocholithiasis was potentially found based on                         sweep but no clear evidence of this based on                         cholangiogram and endoscopic examination. Complete                         removal was accomplished by biliary sphincterotomy and                         balloon extraction.                         - No specimens collected.   Patient reports that she has not had any further abdominal pain since her procedure.  No episodic symptoms, no constant abdominal pain at baseline either that she had previously.  Reports good appetite.  No nausea or vomiting.  Reports 1 bowel movement today that is loose or soft, but no other bowel movements throughout the day.  No blood in stool.  Previous history on last clinic note: She had undergone HIDA scan, work-up for acute intermittent porphyria, and her episodic abdominal pain symptoms have been correlated with the elevation in liver enzymes, meeting Rome IV criteria for sphincter of Oddi dysfunction as well.  She was referred to [facility] for consideration of ERCP for this and initially they managed her conservatively, but due to continued episodic symptoms, patient underwent ERCP as above.  Please note that there was some question of elevation in liver  enzymes being related to her cholesterol medications which were subsequently changed but liver enzymes continued to increase intermittently despite that.  Patient continues to have very episodic symptoms of abdominal pain, with the last episode occurring about 5 days ago and lasting for 10 hours.  It starts in the umbilical region to epigastric region and radiates up to the xiphoid notch.  The pain is about 8/10, dull, nonradiating.  Unrelated to meals.  Once she vomits, the pain subsides.  These episodes are occurring far and few in between, and anywhere from once a month, to less frequently.  In between episodes she is either normal or may  have mild epigastric discomfort that she takes Tylenol for which helps.  During the severe episodes like the one described above, she has taken Tylenol that did not help last time.  She also try taking IBgard that did not help.   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  ERCP was discussed but not planned.  She has had evaluation with Dr. Dahlia Byes given small hiatal hernia and conservative management has been recommended so far.  Gastric emptying study, MRCP has been unrevealing.  ROS: All ROS reviewed and negative except as per HPI   Past Medical History:  Diagnosis Date   Arthritis    Car sickness    Complication of anesthesia    hard to wake up   Coronary artery disease 11/2018   calcification in LAD and RCA in screening lung scan    Family history of malignant neoplasm of breast    Maternal/paternal aunts and cousin   Hypertension    Left bundle branch block    Lump or mass in breast 2011/2012   benign   Obesity, unspecified    Osteopenia    took Boniva 1 1/2 years in past-doesn't want to restart it   Personal history of tobacco use, presenting hazards to health    PONV (postoperative nausea and vomiting)    Positive PPD    as a child.  xray was normal.   Systemic sclerosis [facility])     Past Surgical History:  Procedure Laterality Date   APPENDECTOMY  1961   BREAST BIOPSY Right Nov 2011   benign   BREAST BIOPSY Left Nov 2012   dilated ducts-benign   BUNIONECTOMY Bilateral 2010   x 2   CHOLECYSTECTOMY N/A 10/03/2019   Procedure: LAPAROSCOPIC CHOLECYSTECTOMY;  Surgeon: Fredirick Maudlin, MD;  Location: [facility] OR[facility];  Service: General;  Laterality: N/A;   COLONOSCOPY  8/05   Dr. Vira Agar   COLONOSCOPY WITH PROPOFOL N/A 01/10/2019   Procedure: COLONOSCOPY WITH PROPOFOL;  Surgeon: Virgel Manifold, MD;  Location: [facility];  Service: Endoscopy;  Laterality: N/A;   DECOMPRESSIVE LUMBAR LAMINECTOMY LEVEL 2 N/A 04/30/2015   Procedure: CENTRAL DECOMPRESSIVE LUMBER LAMINECTOMY WITH POSSIBLE FORAMINOTOMIES L3-L4,L4-L5;  Surgeon: Latanya Maudlin, MD;  Location[facility];  Service: Orthopedics;  Laterality: N/A;   ESOPHAGOGASTRODUODENOSCOPY (EGD) WITH PROPOFOL N/A 04/07/2021   Procedure: ESOPHAGOGASTRODUODENOSCOPY (EGD) WITH PROPOFOL;  Surgeon: Virgel Manifold, MD;  Location: [place];  Service: Endoscopy;  Laterality: N/A;   POLYPECTOMY  04/07/2021   Procedure: POLYPECTOMY;  Surgeon: Virgel Manifold, MD;  Location: [place];  Service: Endoscopy;;   [place]    Prior to Admission medications   Medication Sig Start Date End Date Taking? Authorizing Provider  amLODipine (NORVASC) 5 MG tablet Take 1 tablet (5 mg total) by mouth daily. 02/12/21   Kate Sable, MD  Ascorbic Acid 500 MG CHEW Chew by mouth daily.    [provider]  aspirin EC 81 MG tablet Take 81 mg by mouth daily.    [provider]  Cholecalciferol (VITAMIN D) 50 MCG (2000 UT) CAPS Take 2,000 Units by mouth daily.    [provider]  cyclobenzaprine (FLEXERIL) 10 MG tablet Take 0.5-1 tablets (5-10 mg total) by mouth at bedtime as needed for muscle spasms. 07/07/21   Burnard Hawthorne, FNP  ezetimibe (ZETIA) 10 MG tablet Take 1  tablet (10 mg total) by mouth daily. 09/03/21 12/02/21  Kate Sable, MD  meloxicam (MOBIC) 15 MG tablet Take 15 mg by mouth as needed. 06/02/20   [provider]  Multiple Vitamins-Minerals (PRESERVISION AREDS 2 PO) Take by mouth daily.    [provider]    Family History  Problem Relation Age of Onset   Lung cancer Father    Heart disease Father 19       Cardiac Arrest   Heart disease Mother    Hypertension Mother    Stroke Mother    Other Brother        blood clots   Other Other        hypercoag-family history   Cancer Maternal Aunt        Breast cancer   Breast cancer Maternal Aunt 88   Breast cancer Paternal Aunt 39   Breast cancer Cousin      Social History   Tobacco Use   Smoking status: Former    Packs/day: 1.00    Years: 50.00    Pack years: 50.00    Types: Cigarettes    Quit date: 11/29/2014    Years since quitting: 6.9   Smokeless tobacco: Former    Quit date: 04/30/2015  Vaping Use   Vaping Use: Never used  Substance Use Topics   Alcohol use: Yes    Alcohol/week: 0.0 standard drinks    Comment: occasional glassof wine    Drug use: No    Allergies as of 11/03/2021   (No Known Allergies)    Physical Examination:  Constitutional: General:   Alert,  Well-developed, well-nourished, pleasant and cooperative in NAD Vitals:   11/03/21 1325  BP: 130/81  Pulse: 88  Temp: 98.4 F (36.9 C)  TempSrc: Oral  Weight: 166 lb (75.3 kg)     Respiratory: Normal respiratory effort  Gastrointestinal:  Soft, non-tender and non-distended without masses, hepatosplenomegaly or hernias noted.  No guarding or rebound tenderness.     Cardiac: No clubbing or edema.  No cyanosis. Normal posterior tibial pedal pulses noted.  Psych:  Alert and cooperative. Normal mood and affect.  Musculoskeletal:  Normal gait. Head normocephalic, atraumatic. Symmetrical without gross deformities. 5/5 Lower  extremity strength bilaterally.  Skin: Warm. Intact without significant lesions or rashes. No jaundice.  Neck: Supple, trachea midline  Lymph: No cervical lymphadenopathy  Psych:  Alert and oriented x3, Alert and cooperative. Normal mood and affect.  Labs: CMP     Component Value Date/Time   NA 141 09/01/2021 1536   K 4.2 09/01/2021 1536   CL 102 09/01/2021 1536   CO2 24 09/01/2021 1536   GLUCOSE 90 09/01/2021 1536   GLUCOSE 89 06/29/2021 0918   BUN 17 09/01/2021 1536   CREATININE 0.69 09/01/2021 1536   CALCIUM 9.5 09/01/2021 1536   PROT 5.9 (L) 10/01/2021 0823   ALBUMIN 4.2 10/01/2021 0823   AST 20 10/01/2021 0823   ALT 14 10/01/2021 0823   ALKPHOS 172 (H) 10/01/2021 0823   BILITOT 0.5 10/01/2021 0823   GFRNONAA >60 08/06/2020 1344   GFRAA >60 08/06/2020 1344   Lab Results  Component Value Date   WBC 5.3 09/01/2021   HGB 13.5 09/01/2021   HCT 40.5 09/01/2021   MCV 94 09/01/2021   PLT 236 09/01/2021    Imaging Studies:   Assessment and Plan:   Valerie Curry is a 75 y.o. y/o female here for follow-up of intermittent abdominal pain symptoms associated with elevation in liver  enzymes, currently asymptomatic after ERCP with sphincterotomy at [facility] in November 2022  Patient states the endoscopist at [facility] told her before the procedure that he is 60% sure that the procedure may resolve her symptoms before the procedure.  However, after the findings of the procedure he told her that he is 90% sure.  Patient states she feels much better since the procedure and has not had any further abdominal pain  Given complete resolution of symptoms, her diagnosis is most likely consistent with sphincter of Oddi dysfunction pain that has resolved at this time with sphincterotomy  Recheck liver enzymes to ensure they have normalized  She is not having true diarrhea but rather just 1 loose bowel movement daily.  We did discussed trial of colestipol, but patient refuses at this time.   However, if she would like to try this in the future she states she will message [place] on MyChart and we have encouraged her to do so  Chronic recall for follow-up set for 6 months.  Patient encouraged to follow-up earlier if symptoms recur   Dr Valerie Curry

## 2021-11-04 LAB — HEPATIC FUNCTION PANEL
ALT: 14 IU/L (ref 0–32)
AST: 20 IU/L (ref 0–40)
Albumin: 4.3 g/dL (ref 3.7–4.7)
Alkaline Phosphatase: 95 IU/L (ref 44–121)
Bilirubin Total: 0.4 mg/dL (ref 0.0–1.2)
Bilirubin, Direct: 0.13 mg/dL (ref 0.00–0.40)
Total Protein: 6.2 g/dL (ref 6.0–8.5)

## 2021-11-06 ENCOUNTER — Other Ambulatory Visit: Payer: Self-pay

## 2021-11-06 DIAGNOSIS — E041 Nontoxic single thyroid nodule: Secondary | ICD-10-CM

## 2021-11-12 ENCOUNTER — Telehealth: Payer: Self-pay | Admitting: Family

## 2021-11-12 NOTE — Telephone Encounter (Signed)
Rejection Reason - Not taking new patients - Unfortunately, due to extremely limited availability, Dr. Gabriel Carina and Dr. Honor Junes are unable to accept new patients at this time. Please re-direct this referral to a different endocrinology office. We apologize for the inconvenience." Doyle Askew said on Nov 11, 2021 4:03 PM  Msg from [facility] endo

## 2021-11-17 ENCOUNTER — Other Ambulatory Visit: Payer: Self-pay

## 2021-11-17 DIAGNOSIS — E041 Nontoxic single thyroid nodule: Secondary | ICD-10-CM

## 2021-11-17 NOTE — Telephone Encounter (Signed)
noted 

## 2021-11-17 NOTE — Telephone Encounter (Signed)
Valerie Curry  Would you call Dr Debbora Dus office below regarding Valerie Curry thyroid nodules which require a biopsy.   She has left side thyroid nodule 2.8cm which per ultrasound meets criteria for biopsy.   Please explain that we are having difficulty getting into in endocrine and Dr Nicki Reaper recommended him for biopsy.    [email or web address]  Address: [address], [place], [place] [address] Hours:  Open ? Closes 5PM Phone: [phone]   Let me know what you find out and it we can refer her there. We can then discuss with patient and move

## 2021-11-17 NOTE — Telephone Encounter (Signed)
I called [facility] & Dr. Scherrie Merritts does take referrals for thyroid biopsy. I have placed referral to him. I called patient to discuss with her & she was okay with my doing this. I gave her clinic & physician info. She will let us know if she does not hear from their office in the next two weeks to be scheduled.

## 2021-12-14 ENCOUNTER — Encounter: Payer: Self-pay | Admitting: Cardiology

## 2021-12-15 ENCOUNTER — Other Ambulatory Visit: Payer: Self-pay

## 2021-12-15 MED ORDER — EZETIMIBE 10 MG PO TABS: 10.0000 mg | ORAL_TABLET | Freq: Every day | ORAL | 3 refills | Status: DC

## 2021-12-28 ENCOUNTER — Other Ambulatory Visit: Payer: Self-pay

## 2021-12-28 ENCOUNTER — Encounter: Payer: Self-pay | Admitting: Family

## 2021-12-28 DIAGNOSIS — Z87891 Personal history of nicotine dependence: Secondary | ICD-10-CM

## 2021-12-28 DIAGNOSIS — Z0000 Encounter for general adult medical examination without abnormal findings: Secondary | ICD-10-CM

## 2021-12-29 ENCOUNTER — Other Ambulatory Visit: Payer: Self-pay

## 2021-12-30 DIAGNOSIS — E042 Nontoxic multinodular goiter: Secondary | ICD-10-CM | POA: Diagnosis not present

## 2021-12-31 ENCOUNTER — Other Ambulatory Visit (INDEPENDENT_AMBULATORY_CARE_PROVIDER_SITE_OTHER): Payer: PPO

## 2021-12-31 ENCOUNTER — Other Ambulatory Visit: Payer: Self-pay

## 2021-12-31 DIAGNOSIS — Z0000 Encounter for general adult medical examination without abnormal findings: Secondary | ICD-10-CM | POA: Diagnosis not present

## 2021-12-31 LAB — COMPREHENSIVE METABOLIC PANEL
ALT: 13 U/L (ref 0–35)
AST: 20 U/L (ref 0–37)
Albumin: 4 g/dL (ref 3.5–5.2)
Alkaline Phosphatase: 74 U/L (ref 39–117)
BUN: 20 mg/dL (ref 6–23)
CO2: 33 mEq/L — ABNORMAL HIGH (ref 19–32)
Calcium: 9.2 mg/dL (ref 8.4–10.5)
Chloride: 104 mEq/L (ref 96–112)
Creatinine, Ser: 0.81 mg/dL (ref 0.40–1.20)
GFR: 71.17 mL/min (ref >60.00)
Glucose, Bld: 85 mg/dL (ref 70–99)
Potassium: 4.1 mEq/L (ref 3.5–5.1)
Sodium: 142 mEq/L (ref 135–145)
Total Bilirubin: 0.6 mg/dL (ref 0.2–1.2)
Total Protein: 6.2 g/dL (ref 6.0–8.3)

## 2021-12-31 LAB — CBC WITH DIFFERENTIAL/PLATELET
Basophils Absolute: 0 10*3/uL (ref 0.0–0.1)
Basophils Relative: 0.4 % (ref 0.0–3.0)
Eosinophils Absolute: 0.1 10*3/uL (ref 0.0–0.7)
Eosinophils Relative: 1.7 % (ref 0.0–5.0)
HCT: 40 % (ref 36.0–46.0)
Hemoglobin: 13.2 g/dL (ref 12.0–15.0)
Lymphocytes Relative: 37.7 % (ref 12.0–46.0)
Lymphs Abs: 1.6 10*3/uL (ref 0.7–4.0)
MCHC: 33.1 g/dL (ref 30.0–36.0)
MCV: 92.8 fl (ref 78.0–100.0)
Monocytes Absolute: 0.4 10*3/uL (ref 0.1–1.0)
Monocytes Relative: 9.8 % (ref 3.0–12.0)
Neutro Abs: 2.2 10*3/uL (ref 1.4–7.7)
Neutrophils Relative %: 50.4 % (ref 43.0–77.0)
Platelets: 197 10*3/uL (ref 150.0–400.0)
RBC: 4.31 Mil/uL (ref 3.87–5.11)
RDW: 13.9 % (ref 11.5–15.5)
WBC: 4.3 10*3/uL (ref 4.0–10.5)

## 2021-12-31 LAB — VITAMIN D 25 HYDROXY (VIT D DEFICIENCY, FRACTURES): VITD: 70.65 ng/mL (ref 30.00–100.00)

## 2021-12-31 LAB — TSH: TSH: 1.36 u[IU]/mL (ref 0.35–5.50)

## 2021-12-31 LAB — HEMOGLOBIN A1C: Hgb A1c MFr Bld: 5.4 % (ref 4.6–6.5)

## 2022-01-04 ENCOUNTER — Other Ambulatory Visit: Payer: Self-pay | Admitting: Surgery

## 2022-01-04 DIAGNOSIS — E042 Nontoxic multinodular goiter: Secondary | ICD-10-CM

## 2022-01-04 NOTE — Progress Notes (Signed)
Subjective:    Patient ID: Valerie Curry, female    DOB: 04-Aug-1946, 76 y.o.   MRN: [id number]  CC: Valerie Curry is a 76 y.o. female who presents today for follow up.   HPI: Feels well today No new complaints.  No recurrence of abdominal pain.    HTN- compliant with amlodipine 5mg . No cp, sob.   CAD- She wasn't able to continue repatha due cost. She has since started zetia 10mg .    Compliant with 2000units vitamin d daily  She had consult with Dr Harlow Asa for multiple thyroid nodules 12/30/21.  Thyroid biopsy has been scheduled 01/19/22  Upcoming follow-up with Dr. Charlestine Night 02/15/2022  11/03/21 follow up GI, Dr. Bonna Gains regarding intermittent abdominal pain. She has ERCP with sphincterotomy at [facility] Dr Dell Ponto 09/2021. 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 Liver enzymes normalized  12/31/21 HISTORY:  Past Medical History:  Diagnosis Date   Arthritis    Car sickness    Complication of anesthesia    hard to wake up   Coronary artery disease 11/2018   calcification in LAD and RCA in screening lung scan    Family history of malignant neoplasm of breast    Maternal/paternal aunts and cousin   Hypertension    Left bundle branch block    Lump or mass in breast 2011/2012   benign   Obesity, unspecified    Osteopenia    took Boniva 1 1/2 years in past-doesn't want to restart it   Personal history of tobacco use, presenting hazards to health    PONV (postoperative nausea and vomiting)    Positive PPD    as a child. xray was normal.   Systemic sclerosis [facility])    Past Surgical History:  Procedure Laterality Date   APPENDECTOMY  1961   BREAST BIOPSY Right Nov 2011   benign   BREAST BIOPSY Left Nov 2012   dilated ducts-benign   BUNIONECTOMY Bilateral 2010   x 2   CHOLECYSTECTOMY N/A 10/03/2019   Procedure: LAPAROSCOPIC CHOLECYSTECTOMY;  Surgeon: Fredirick Maudlin, MD;  Location: [facility] ORS;  Service: General;  Laterality: N/A;   COLONOSCOPY  8/05    Dr. Vira Agar   COLONOSCOPY WITH PROPOFOL N/A 01/10/2019   Procedure: COLONOSCOPY WITH PROPOFOL;  Surgeon: Virgel Manifold, MD;  Location: [facility] ENDOSCOPY;  Service: Endoscopy;  Laterality: N/A;   DECOMPRESSIVE LUMBAR LAMINECTOMY LEVEL 2 N/A 04/30/2015   Procedure: CENTRAL DECOMPRESSIVE LUMBER LAMINECTOMY WITH POSSIBLE FORAMINOTOMIES L3-L4,L4-L5;  Surgeon: Latanya Maudlin, MD;  Location: [facility];  Service: Orthopedics;  Laterality: N/A;   ESOPHAGOGASTRODUODENOSCOPY (EGD) WITH PROPOFOL N/A 04/07/2021   Procedure: ESOPHAGOGASTRODUODENOSCOPY (EGD) WITH PROPOFOL;  Surgeon: Virgel Manifold, MD;  Location: [place];  Service: Endoscopy;  Laterality: N/A;   POLYPECTOMY  04/07/2021   Procedure: POLYPECTOMY;  Surgeon: Virgel Manifold, MD;  Location: [place];  Service: Endoscopy;;   [place]   Family History  Problem Relation Age of Onset   Lung cancer Father    Heart disease Father 60       Cardiac Arrest   Heart disease Mother    Hypertension Mother    Stroke Mother    Other Brother        blood clots   Other Other        hypercoag-family history   Cancer Maternal Aunt        Breast cancer   Breast cancer Maternal Aunt  64   Breast cancer Paternal Aunt 8   Breast cancer Cousin     Allergies: Patient has no known allergies. Current Outpatient Medications on File Prior to Visit  Medication Sig Dispense Refill   amLODipine (NORVASC) 5 MG tablet Take 1 tablet (5 mg total) by mouth daily. 30 tablet 11   Ascorbic Acid 500 MG CHEW Chew by mouth daily.     aspirin EC 81 MG tablet Take 81 mg by mouth daily.     Cholecalciferol (VITAMIN D) 50 MCG (2000 UT) CAPS Take 1,000 Units by mouth daily.     meloxicam (MOBIC) 15 MG tablet Take 15 mg by mouth as needed.     Multiple Vitamins-Minerals (PRESERVISION AREDS 2 PO) Take by mouth daily.     No current facility-administered medications on file prior to visit.    Social History    Tobacco Use   Smoking status: Former    Packs/day: 1.00    Years: 50.00    Pack years: 50.00    Types: Cigarettes    Quit date: 11/29/2014    Years since quitting: 7.1   Smokeless tobacco: Former    Quit date: 04/30/2015  Vaping Use   Vaping Use: Never used  Substance Use Topics   Alcohol use: Yes    Alcohol/week: 0.0 standard drinks    Comment: occasional glassof wine    Drug use: No    Review of Systems  Constitutional:  Negative for chills and fever.  Respiratory:  Negative for cough.   Cardiovascular:  Negative for chest pain and palpitations.  Gastrointestinal:  Negative for abdominal pain, nausea and vomiting.     Objective:    BP 128/72 (BP Location: Left Arm, Patient Position: Sitting, Cuff Size: Large)    Pulse 82    Temp 98.5 F (36.9 C) (Oral)    Ht 5\' 2"  (1.575 m)    Wt 168 lb 1.9 oz (76.3 kg)    SpO2 97%    BMI 30.75 kg/m  BP Readings from Last 3 Encounters:  01/05/22 128/72  11/03/21 130/81  09/01/21 129/84   Wt Readings from Last 3 Encounters:  01/05/22 168 lb 1.9 oz (76.3 kg)  11/03/21 166 lb (75.3 kg)  09/01/21 167 lb 9.6 oz (76 kg)    Physical Exam Vitals reviewed.  Constitutional:      Appearance: She is well-developed.  Eyes:     Conjunctiva/sclera: Conjunctivae normal.  Cardiovascular:     Rate and Rhythm: Normal rate and regular rhythm.     Pulses: Normal pulses.     Heart sounds: Normal heart sounds.  Pulmonary:     Effort: Pulmonary effort is normal.     Breath sounds: Normal breath sounds. No wheezing, rhonchi or rales.  Skin:    General: Skin is warm and dry.  Neurological:     Mental Status: She is alert.  Psychiatric:        Speech: Speech normal.        Behavior: Behavior normal.        Thought Content: Thought content normal.       Assessment & Plan:   Problem List Items Addressed This Visit       Cardiovascular and Mediastinum   Coronary artery disease - Primary    Briefly elevated LFTs in 2022, and suspect related  to center of Oddi dysfunction.  LFTs normalized.  We jointly agreed to retrial Crestor 20 mg.  She will stop Zetia.  Continue aspirin 81 mg.  She will maintain follow-up with cardiology and recheck LFTs in 6 weeks time.       Relevant Medications   rosuvastatin (CRESTOR) 20 MG tablet   Other Relevant Orders   Comprehensive metabolic panel   HTN (hypertension)    Chronic, stable.  Continue amlodipine 5 mg      Relevant Medications   rosuvastatin (CRESTOR) 20 MG tablet     Other   Epigastric pain    Extremely pleased that symptoms have completely resolved after  ERCP with sphincterotomy at [facility] Dr Dell Ponto 09/2021 . No recurrence in the last couple of months.  We will continue to monitor patient and she will let me know if anything were to change.     Note: advised to decrease vit D to 1000units D as discussed my concern that she could acquire vitamin D toxicity   I have discontinued Zaia Carre. Salmela's ezetimibe. I am also having her start on rosuvastatin. Additionally, I am having her maintain her Vitamin D, aspirin EC, meloxicam, Ascorbic Acid, amLODipine, and Multiple Vitamins-Minerals (PRESERVISION AREDS 2 PO).   Meds ordered this encounter  Medications   rosuvastatin (CRESTOR) 20 MG tablet    Sig: Take 1 tablet (20 mg total) by mouth every evening.    Dispense:  90 tablet    Refill:  3    Order Specific Question:   Supervising Provider    Answer:   Crecencio Mc [[id number]]    Return precautions given.   Risks, benefits, and alternatives of the medications and treatment plan prescribed today were discussed, and patient expressed understanding.   Education regarding symptom management and diagnosis given to patient on AVS.  Continue to follow with Burnard Hawthorne, FNP for routine health maintenance.   Valerie Curry and I agreed with plan.   Mable Paris, FNP

## 2022-01-05 ENCOUNTER — Ambulatory Visit (INDEPENDENT_AMBULATORY_CARE_PROVIDER_SITE_OTHER): Payer: PPO | Admitting: Family

## 2022-01-05 ENCOUNTER — Encounter: Payer: Self-pay | Admitting: Family

## 2022-01-05 ENCOUNTER — Other Ambulatory Visit: Payer: Self-pay

## 2022-01-05 VITALS — BP 128/72 | HR 82 | Temp 98.5°F | Ht 62.0 in | Wt 168.1 lb

## 2022-01-05 DIAGNOSIS — I2510 Atherosclerotic heart disease of native coronary artery without angina pectoris: Secondary | ICD-10-CM | POA: Diagnosis not present

## 2022-01-05 DIAGNOSIS — R1013 Epigastric pain: Secondary | ICD-10-CM | POA: Diagnosis not present

## 2022-01-05 DIAGNOSIS — I10 Essential (primary) hypertension: Secondary | ICD-10-CM

## 2022-01-05 MED ORDER — ROSUVASTATIN CALCIUM 20 MG PO TABS
20.0000 mg | ORAL_TABLET | Freq: Every evening | ORAL | 3 refills | Status: DC
Start: 2022-01-05 — End: 2022-10-05

## 2022-01-05 NOTE — Assessment & Plan Note (Signed)
Chronic, stable. Continue amlodipine 5mg 

## 2022-01-05 NOTE — Assessment & Plan Note (Signed)
Briefly elevated LFTs in 2022, and suspect related to center of Oddi dysfunction.  LFTs normalized.  We jointly agreed to retrial Crestor 20 mg.  She will stop Zetia.  Continue aspirin 81 mg.  She will maintain follow-up with cardiology and recheck LFTs in 6 weeks time.

## 2022-01-05 NOTE — Patient Instructions (Addendum)
Consider CT calcium score test, echocardiogram, or stress test.   Take vitamin D 1000 units daily  As discussed, please resume Crestor 20 mg.  We will recheck liver enzymes in 6 weeks

## 2022-01-05 NOTE — Assessment & Plan Note (Addendum)
Extremely pleased that symptoms have completely resolved after  ERCP with sphincterotomy at [facility] Dr Dell Ponto 09/2021 . No recurrence in the last couple of months.  We will continue to monitor patient and she will let me know if anything were to change.

## 2022-01-14 ENCOUNTER — Other Ambulatory Visit: Payer: Self-pay | Admitting: Family

## 2022-01-14 DIAGNOSIS — Z1231 Encounter for screening mammogram for malignant neoplasm of breast: Secondary | ICD-10-CM

## 2022-01-19 ENCOUNTER — Ambulatory Visit
Admission: RE | Admit: 2022-01-19 | Discharge: 2022-01-19 | Disposition: A | Discharge status: Home / Self Care | Payer: PPO | Source: Ambulatory Visit | Attending: Surgery | Admitting: Surgery

## 2022-01-19 ENCOUNTER — Other Ambulatory Visit (HOSPITAL_COMMUNITY)
Admission: RE | Admit: 2022-01-19 | Discharge: 2022-01-19 | Disposition: A | Discharge status: Home / Self Care | Payer: PPO | Source: Ambulatory Visit | Attending: Surgery | Admitting: Surgery

## 2022-01-19 DIAGNOSIS — E042 Nontoxic multinodular goiter: Secondary | ICD-10-CM | POA: Diagnosis not present

## 2022-01-19 DIAGNOSIS — E041 Nontoxic single thyroid nodule: Secondary | ICD-10-CM | POA: Diagnosis not present

## 2022-01-20 LAB — CYTOLOGY - NON PAP

## 2022-01-24 NOTE — Progress Notes (Signed)
FNA biopsy results are benign.  Good news!  Will plan follow up visit in one year with repeat USN and TSH level prior to the office visit.  tmg  Armandina Gemma, [facility] [place] practice Office: [phone]

## 2022-01-26 ENCOUNTER — Ambulatory Visit
Admission: RE | Admit: 2022-01-26 | Discharge: 2022-01-26 | Disposition: A | Discharge status: Home / Self Care | Payer: PPO | Source: Ambulatory Visit | Attending: Acute Care | Admitting: Acute Care

## 2022-01-26 ENCOUNTER — Other Ambulatory Visit: Payer: Self-pay

## 2022-01-26 DIAGNOSIS — Z87891 Personal history of nicotine dependence: Secondary | ICD-10-CM | POA: Diagnosis not present

## 2022-01-28 ENCOUNTER — Other Ambulatory Visit: Payer: Self-pay

## 2022-01-28 DIAGNOSIS — Z87891 Personal history of nicotine dependence: Secondary | ICD-10-CM

## 2022-02-09 ENCOUNTER — Encounter: Payer: Self-pay | Admitting: Family

## 2022-02-16 ENCOUNTER — Other Ambulatory Visit: Payer: Self-pay

## 2022-02-16 ENCOUNTER — Other Ambulatory Visit (INDEPENDENT_AMBULATORY_CARE_PROVIDER_SITE_OTHER): Payer: PPO

## 2022-02-16 DIAGNOSIS — I2510 Atherosclerotic heart disease of native coronary artery without angina pectoris: Secondary | ICD-10-CM

## 2022-02-16 LAB — COMPREHENSIVE METABOLIC PANEL
ALT: 14 U/L (ref 0–35)
AST: 22 U/L (ref 0–37)
Albumin: 4.3 g/dL (ref 3.5–5.2)
Alkaline Phosphatase: 80 U/L (ref 39–117)
BUN: 22 mg/dL (ref 6–23)
CO2: 30 mEq/L (ref 19–32)
Calcium: 9.3 mg/dL (ref 8.4–10.5)
Chloride: 105 mEq/L (ref 96–112)
Creatinine, Ser: 0.68 mg/dL (ref 0.40–1.20)
GFR: 85.31 mL/min (ref >60.00)
Glucose, Bld: 78 mg/dL (ref 70–99)
Potassium: 4.3 mEq/L (ref 3.5–5.1)
Sodium: 141 mEq/L (ref 135–145)
Total Bilirubin: 0.6 mg/dL (ref 0.2–1.2)
Total Protein: 6.3 g/dL (ref 6.0–8.3)

## 2022-02-18 ENCOUNTER — Encounter: Payer: Self-pay | Admitting: Cardiology

## 2022-02-18 ENCOUNTER — Other Ambulatory Visit: Payer: Self-pay

## 2022-02-18 ENCOUNTER — Ambulatory Visit: Payer: PPO | Admitting: Cardiology

## 2022-02-18 VITALS — BP 142/82 | HR 67 | Ht 62.0 in | Wt 172.0 lb

## 2022-02-18 DIAGNOSIS — I10 Essential (primary) hypertension: Secondary | ICD-10-CM | POA: Diagnosis not present

## 2022-02-18 DIAGNOSIS — I2510 Atherosclerotic heart disease of native coronary artery without angina pectoris: Secondary | ICD-10-CM

## 2022-02-18 NOTE — Progress Notes (Signed)
?Cardiology Office Note:   ? ?Date:  02/18/2022  ? ?ID:  Valerie Curry, DOB 02-13-1946, MRN [id number] ? ?PCP:  Burnard Hawthorne, FNP  ?Cardiologist:  Kate Sable, MD  ?Electrophysiologist:  None  ? ?Referring MD: Burnard Hawthorne, FNP  ? ?Chief Complaint  ?Patient presents with  ? OTher  ?  12 month follow up -- Meds reviewed verbally with patient.   ? ? ?History of Present Illness:   ? ?Valerie Curry is a 76 y.o. female with a hx of hypertension, CAD (calcifications in RCA, LAD on chest CT, former smoker x40+ years who presents follow-up.  ? ?Being seen for hyperlipidemia and coronary calcifications.  Previously had elevated LFTs due to sphincter of Oddi dysfunction.   This has been successfully managed with gastroenterology.  LFTs have since normalized.  Crestor which was previously held was restarted.  LFTs have stayed normal on last check.  Amlodipine increased to 5 mg daily, blood pressure has been controlled.  She feels well, tolerating all medications as prescribed, denies chest pain or shortness of breath. ? ? ?Prior notes ?CT chest lung cancer screening 11/2018 showed calcified plaque in LAD and RCA. 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 ? ? ?Past Medical History:  ?Diagnosis Date  ? Arthritis   ? Car sickness   ? Complication of anesthesia   ? hard to wake up  ? Coronary artery disease 11/2018  ? calcification in LAD and RCA in screening lung scan   ? Family history of malignant neoplasm of breast   ? Maternal/paternal aunts and cousin  ? Hypertension   ? Left bundle branch block   ? Lump or mass in breast 2011/2012  ? benign  ? Obesity, unspecified   ? Osteopenia   ? took Boniva 1 1/2 years in past-doesn't want to restart it  ? Personal history of tobacco use, presenting hazards to health   ? PONV (postoperative nausea and vomiting)   ? Positive PPD   ? as a child. xray was normal.  ? Systemic sclerosis ([place])   ? ? ?Past Surgical History:  ?Procedure Laterality Date  ?  APPENDECTOMY  1961  ? BREAST BIOPSY Right Nov 2011  ? benign  ? BREAST BIOPSY Left Nov 2012  ? dilated ducts-benign  ? BUNIONECTOMY Bilateral 2010  ? x 2  ? CHOLECYSTECTOMY N/A 10/03/2019  ? Procedure: LAPAROSCOPIC CHOLECYSTECTOMY;  Surgeon: Fredirick Maudlin, MD;  Location: [facility] ORS;  Service: General;  Laterality: N/A;  ? COLONOSCOPY  8/05  ? Dr. Vira Agar  ? COLONOSCOPY WITH PROPOFOL N/A 01/10/2019  ? Procedure: COLONOSCOPY WITH PROPOFOL;  Surgeon: Virgel Manifold, MD;  Location: [facility] ENDOSCOPY;  Service: Endoscopy;  Laterality: N/A;  ? DECOMPRESSIVE LUMBAR LAMINECTOMY LEVEL 2 N/A 04/30/2015  ? Procedure: CENTRAL DECOMPRESSIVE LUMBER LAMINECTOMY WITH POSSIBLE FORAMINOTOMIES L3-L4,L4-L5;  Surgeon: Latanya Maudlin, MD;  Location: [facility];  Service: Orthopedics;  Laterality: N/A;  ? ESOPHAGOGASTRODUODENOSCOPY (EGD) WITH PROPOFOL N/A 04/07/2021  ? Procedure: ESOPHAGOGASTRODUODENOSCOPY (EGD) WITH PROPOFOL;  Surgeon: Virgel Manifold, MD;  Location: [place];  Service: Endoscopy;  Laterality: N/A;  ? POLYPECTOMY  04/07/2021  ? Procedure: POLYPECTOMY;  Surgeon: Virgel Manifold, MD;  Location: [place];  Service: Endoscopy;;  ? TONSILLECTOMY  1968  ? TUBAL LIGATION  1974  ? ? ?Current Medications: ?Current Meds  ?Medication Sig  ? amLODipine (NORVASC) 5 MG tablet Take 1 tablet (5 mg total) by mouth daily.  ? Ascorbic Acid 500 MG CHEW  Chew by mouth daily.  ? aspirin EC 81 MG tablet Take 81 mg by mouth daily.  ? Cholecalciferol (VITAMIN D) 50 MCG (2000 UT) CAPS Take 1,000 Units by mouth daily.  ? meloxicam (MOBIC) 15 MG tablet Take 15 mg by mouth as needed.  ? Multiple Vitamins-Minerals (PRESERVISION AREDS 2 PO) Take by mouth daily.  ? Probiotic Product (PROBIOTIC BLEND) CAPS   ? rosuvastatin (CRESTOR) 20 MG tablet Take 1 tablet (20 mg total) by mouth every evening.  ?  ? ?Allergies:   Patient has no known allergies.  ? ?Social History  ? ?Socioeconomic History  ? Marital status: Married  ?  Spouse  name: Not on file  ? Number of children: 2  ? Years of education: 14  ? Highest education level: Not on file  ?Occupational History  ? Occupation: Web designer  ?  Employer: [employer]  ?Tobacco Use  ? Smoking status: Former  ?  Packs/day: 1.00  ?  Years: 50.00  ?  Pack years: 50.00  ?  Types: Cigarettes  ?  Quit date: 11/29/2014  ?  Years since quitting: 7.2  ? Smokeless tobacco: Former  ?  Quit date: 04/30/2015  ?Vaping Use  ? Vaping Use: Never used  ?Substance and Sexual Activity  ? Alcohol use: Yes  ?  Alcohol/week: 0.0 standard drinks  ?  Comment: occasional glassof wine   ? Drug use: No  ? Sexual activity: Yes  ?  Birth control/protection: Post-menopausal  ?Other Topics Concern  ? Not on file  ?Social History Narrative  ? Pt is 76yo female. Pt was born in [place], moved to [place] when she was 58.  Pt is married to husband of 22 years.  Pt has 2 sons from a previous marriage and 4 step sons and 68 Grandchildren.   ?   ?  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   ?   ?  Pt works out at gym 3/week (2 days a week in a class, 1 day a week with an Dietitian).  Pt enjoys reading, spending time with her family. Pt and her husband are members of [employer] and active in their church.   ? ?Social Determinants of Health  ? ?Financial Resource Strain: Not on file  ?Food Insecurity: Not on file  ?Transportation Needs: Not on file  ?Physical Activity: Not on file  ?Stress: Not on file  ?Social Connections: Not on file  ?  ? ?Family History: ?The patient's family history includes Breast cancer in her cousin; Breast cancer (age of onset: 49) in her maternal aunt and paternal aunt; Cancer in her maternal aunt; Heart disease in her mother; Heart disease (age of onset: 37) in her father; Hypertension in her mother; Lung cancer in her father; Other in her brother and another family member; Stroke in her mother. ? ?ROS:   ?Please see the history of present  illness.    ? All other systems reviewed and are negative. ? ?EKGs/Labs/Other Studies Reviewed:   ? ?The following studies were reviewed today: ? ? ?EKG:  EKG is ordered today.  EKG shows normal sinus rhythm, left bundle branch block. ? ?Recent Labs: ?12/31/2021: Hemoglobin 13.2; Platelets 197.0; TSH 1.36 ?02/16/2022: ALT 14; BUN 22; Creatinine, Ser 0.68; Potassium 4.3; Sodium 141  ?Recent Lipid Panel ?   ?Component Value Date/Time  ? CHOL 149 10/01/2021 0823  ? TRIG 56 10/01/2021 0823  ? HDL 67 10/01/2021 0823  ?  CHOLHDL 2.2 10/01/2021 0823  ? CHOLHDL 2 06/29/2021 0918  ? VLDL 15.4 06/29/2021 0918  ? [place] 70 10/01/2021 0823  ? LDLDIRECT 100.2 07/30/2008 1017  ? ? ?Physical Exam:   ? ?VS:  BP (!) 142/82 (BP Location: Left Arm, Patient Position: Sitting, Cuff Size: Normal)   Pulse 67   Ht '5\' 2"'$  (1.575 m)   Wt 172 lb (78 kg)   SpO2 98%   BMI 31.46 kg/m?    ? ?Wt Readings from Last 3 Encounters:  ?02/18/22 172 lb (78 kg)  ?01/05/22 168 lb 1.9 oz (76.3 kg)  ?11/03/21 166 lb (75.3 kg)  ?  ? ?GEN:  Well nourished, well developed in no acute distress ?HEENT: Normal ?NECK: No JVD; No carotid bruits ?LYMPHATICS: No lymphadenopathy ?CARDIAC: RRR, no murmurs, rubs, gallops ?RESPIRATORY:  Clear to auscultation without rales, wheezing or rhonchi  ?ABDOMEN: Soft, non-tender, non-distended ?MUSCULOSKELETAL:  No edema; No deformity  ?SKIN: Warm and dry ?NEUROLOGIC:  Alert and oriented x 3 ?PSYCHIATRIC:  Normal affect  ? ?ASSESSMENT:   ? ?1. Coronary artery disease involving native coronary artery of native heart without angina pectoris   ?2. Primary hypertension   ? ?PLAN:   ? ?In order of problems listed above: ? ?CAD, calcifications in the RCA and LAD. no chest pain. Has a history of abnormal LFT's secondary to sphincter of artery dysfunction. Continue aspirin, Crestor.  Obtain echocardiogram to evaluate any structural abnormality. ?Hypertension, BP slightly elevated, usually controlled.  Continue amlodipine to 5 mg  daily. ? ?Follow-up yearly ? ?This note was generated in part or whole with voice recognition software. Voice recognition is usually quite accurate but there are transcription errors that can and very often do occur. I apol

## 2022-02-18 NOTE — Patient Instructions (Signed)
Medication Instructions:  ? ?Your physician recommends that you continue on your current medications as directed. Please refer to the Current Medication list given to you today. ? ?*If you need a refill on your cardiac medications before your next appointment, please call your pharmacy* ? ? ?Lab Work: ?None ordered ?If you have labs (blood work) drawn today and your tests are completely normal, you will receive your results only by: ?MyChart Message (if you have MyChart) OR ?A paper copy in the mail ?If you have any lab test that is abnormal or we need to change your treatment, we will call you to review the results. ? ? ?Testing/Procedures: ? ?Your physician has requested that you have an echocardiogram. Echocardiography is a painless test that uses sound waves to create images of your heart. It provides your doctor with information about the size and shape of your heart and how well your heart?s chambers and valves are working. This procedure takes approximately one hour. There are no restrictions for this procedure. ? ? ?Follow-Up: ?At [facility], you and your health needs are our priority.  As part of our continuing mission to provide you with exceptional heart care, we have created designated Provider Care Teams.  These Care Teams include your primary Cardiologist (physician) and Advanced Practice Providers (APPs -  Physician Assistants and Nurse Practitioners) who all work together to provide you with the care you need, when you need it. ? ?We recommend signing up for the patient portal called "MyChart".  Sign up information is provided on this After Visit Summary.  MyChart is used to connect with patients for Virtual Visits (Telemedicine).  Patients are able to view lab/test results, encounter notes, upcoming appointments, etc.  Non-urgent messages can be sent to your provider as well.   ?To learn more about what you can do with MyChart, go to [email or web address].   ? ?Your next appointment:   ?1  year(s) ? ?The format for your next appointment:   ?In Person ? ?Provider:   ?You may see Kate Sable, MD or one of the following Advanced Practice Providers on your designated Care Team:   ?Murray Hodgkins, NP ?Christell Faith, PA-C ?Cadence Kathlen Mody, PA-C ? ? ?Other Instructions ? ? ?

## 2022-02-23 ENCOUNTER — Other Ambulatory Visit: Payer: Self-pay

## 2022-02-23 ENCOUNTER — Ambulatory Visit
Admission: RE | Admit: 2022-02-23 | Discharge: 2022-02-23 | Disposition: A | Discharge status: Home / Self Care | Payer: PPO | Source: Ambulatory Visit | Attending: Family | Admitting: Family

## 2022-02-23 DIAGNOSIS — Z1231 Encounter for screening mammogram for malignant neoplasm of breast: Secondary | ICD-10-CM | POA: Insufficient documentation

## 2022-02-26 ENCOUNTER — Other Ambulatory Visit: Payer: Self-pay | Admitting: *Deleted

## 2022-02-26 ENCOUNTER — Encounter: Payer: Self-pay | Admitting: Cardiology

## 2022-02-26 DIAGNOSIS — I10 Essential (primary) hypertension: Secondary | ICD-10-CM

## 2022-02-26 MED ORDER — AMLODIPINE BESYLATE 5 MG PO TABS: 5.0000 mg | ORAL_TABLET | Freq: Every day | ORAL | 3 refills | Status: DC

## 2022-02-27 DIAGNOSIS — I5020 Unspecified systolic (congestive) heart failure: Secondary | ICD-10-CM

## 2022-02-27 HISTORY — DX: Unspecified systolic (congestive) heart failure: I50.20

## 2022-03-09 ENCOUNTER — Ambulatory Visit (INDEPENDENT_AMBULATORY_CARE_PROVIDER_SITE_OTHER): Payer: PPO

## 2022-03-09 DIAGNOSIS — I2510 Atherosclerotic heart disease of native coronary artery without angina pectoris: Secondary | ICD-10-CM | POA: Diagnosis not present

## 2022-03-09 LAB — ECHOCARDIOGRAM COMPLETE
AR max vel: 2.81 cm2
AV Area VTI: 3.43 cm2
AV Area mean vel: 2.99 cm2
AV Mean grad: 3 mmHg
AV Peak grad: 6.4 mmHg
Ao pk vel: 1.26 m/s
Area-P 1/2: 4.29 cm2
Calc EF: 41.7 %
P 1/2 time: 646 msec
S' Lateral: 3.55 cm
Single Plane A2C EF: 43.8 %
Single Plane A4C EF: 37 %

## 2022-03-12 ENCOUNTER — Ambulatory Visit: Payer: PPO | Admitting: Cardiology

## 2022-03-12 ENCOUNTER — Encounter: Payer: Self-pay | Admitting: Cardiology

## 2022-03-12 ENCOUNTER — Telehealth: Payer: Self-pay

## 2022-03-12 VITALS — BP 128/90 | HR 82 | Ht 62.0 in | Wt 169.1 lb

## 2022-03-12 DIAGNOSIS — I10 Essential (primary) hypertension: Secondary | ICD-10-CM

## 2022-03-12 DIAGNOSIS — I429 Cardiomyopathy, unspecified: Secondary | ICD-10-CM | POA: Diagnosis not present

## 2022-03-12 DIAGNOSIS — I2510 Atherosclerotic heart disease of native coronary artery without angina pectoris: Secondary | ICD-10-CM | POA: Diagnosis not present

## 2022-03-12 MED ORDER — METOPROLOL SUCCINATE ER 25 MG PO TB24: 25.0000 mg | ORAL_TABLET | Freq: Every day | ORAL | 3 refills | Status: DC

## 2022-03-12 NOTE — H&P (View-Only) (Signed)
?Cardiology Office Note:   ? ?Date:  03/12/2022  ? ?ID:  Valerie Curry, DOB [birth year], MRN [id number] ? ?PCP:  Burnard Hawthorne, FNP  ?Cardiologist:  Kate Sable, MD  ?Electrophysiologist:  None  ? ?Referring MD: Burnard Hawthorne, FNP  ? ?Chief Complaint  ?Patient presents with  ? Other  ?  F/u echo/possible cath no complaints today. Meds reviewed verbally with pt.  ? ? ?History of Present Illness:   ? ?Valerie Curry is a 76 y.o. female with a hx of hypertension, CAD (calcifications in RCA, LAD on chest CT, former smoker x40+ years who presents follow-up.  ? ?Patient previously seen due to coronary artery disease.  Echocardiogram was obtained to evaluate any structural abnormalities.  Denies chest pain or shortness of breath.  Presents for echocardiogram results.  Denies chest pain or shortness of breath.  Denies edema. ? ? ?Prior notes ?CT chest lung cancer screening 11/2018 showed calcified plaque in LAD and RCA. 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 ? ? ?Past Medical History:  ?Diagnosis Date  ? Arthritis   ? Car sickness   ? Complication of anesthesia   ? hard to wake up  ? Coronary artery disease 11/2018  ? calcification in LAD and RCA in screening lung scan   ? Family history of malignant neoplasm of breast   ? Maternal/paternal aunts and cousin  ? Hypertension   ? Left bundle branch block   ? Lump or mass in breast 2011/2012  ? benign  ? Obesity, unspecified   ? Osteopenia   ? took Boniva 1 1/2 years in past-doesn't want to restart it  ? Personal history of tobacco use, presenting hazards to health   ? PONV (postoperative nausea and vomiting)   ? Positive PPD   ? as a child. xray was normal.  ? Systemic sclerosis ([place])   ? ? ?Past Surgical History:  ?Procedure Laterality Date  ? APPENDECTOMY  1961  ? BREAST BIOPSY Right Nov 2011  ? benign  ? BREAST BIOPSY Left Nov 2012  ? dilated ducts-benign  ? BUNIONECTOMY Bilateral 2010  ? x 2  ? CHOLECYSTECTOMY N/A 10/03/2019  ? Procedure:  LAPAROSCOPIC CHOLECYSTECTOMY;  Surgeon: Fredirick Maudlin, MD;  Location: [facility];  Service: General;  Laterality: N/A;  ? COLONOSCOPY  8/05  ? Dr. Vira Agar  ? COLONOSCOPY WITH PROPOFOL N/A 01/10/2019  ? Procedure: COLONOSCOPY WITH PROPOFOL;  Surgeon: Virgel Manifold, MD;  Location: [facility];  Service: Endoscopy;  Laterality: N/A;  ? DECOMPRESSIVE LUMBAR LAMINECTOMY LEVEL 2 N/A 04/30/2015  ? Procedure: CENTRAL DECOMPRESSIVE LUMBER LAMINECTOMY WITH POSSIBLE FORAMINOTOMIES L3-L4,L4-L5;  Surgeon: Latanya Maudlin, MD;  Location: [facility];  Service: Orthopedics;  Laterality: N/A;  ? ESOPHAGOGASTRODUODENOSCOPY (EGD) WITH PROPOFOL N/A 04/07/2021  ? Procedure: ESOPHAGOGASTRODUODENOSCOPY (EGD) WITH PROPOFOL;  Surgeon: Virgel Manifold, MD;  Location: [place];  Service: Endoscopy;  Laterality: N/A;  ? POLYPECTOMY  04/07/2021  ? Procedure: POLYPECTOMY;  Surgeon: Virgel Manifold, MD;  Location: [place];  Service: Endoscopy;;  ? TONSILLECTOMY  1968  ? TUBAL LIGATION  1974  ? ? ?Current Medications: ?Current Meds  ?Medication Sig  ? Ascorbic Acid 500 MG CHEW Chew by mouth daily.  ? aspirin EC 81 MG tablet Take 81 mg by mouth daily.  ? Cholecalciferol (VITAMIN D) 50 MCG (2000 UT) CAPS Take 1,000 Units by mouth daily.  ? meloxicam (MOBIC) 15 MG tablet Take 15 mg by mouth as needed.  ? metoprolol succinate (  TOPROL XL) 25 MG 24 hr tablet Take 1 tablet (25 mg total) by mouth daily.  ? Multiple Vitamins-Minerals (PRESERVISION AREDS 2 PO) Take by mouth daily.  ? Probiotic Product (PROBIOTIC BLEND) CAPS   ? rosuvastatin (CRESTOR) 20 MG tablet Take 1 tablet (20 mg total) by mouth every evening.  ? [DISCONTINUED] amLODipine (NORVASC) 5 MG tablet Take 1 tablet (5 mg total) by mouth daily.  ?  ? ?Allergies:   Patient has no known allergies.  ? ?Social History  ? ?Socioeconomic History  ? Marital status: Married  ?  Spouse name: Not on file  ? Number of children: 2  ? Years of education: 80  ? Highest  education level: Not on file  ?Occupational History  ? Occupation: Web designer  ?  Employer: [employer]  ?Tobacco Use  ? Smoking status: Former  ?  Packs/day: 1.00  ?  Years: 50.00  ?  Pack years: 50.00  ?  Types: Cigarettes  ?  Quit date: 11/29/2014  ?  Years since quitting: 7.2  ? Smokeless tobacco: Former  ?  Quit date: 04/30/2015  ?Vaping Use  ? Vaping Use: Never used  ?Substance and Sexual Activity  ? Alcohol use: Yes  ?  Alcohol/week: 0.0 standard drinks  ?  Comment: occasional glassof wine   ? Drug use: No  ? Sexual activity: Yes  ?  Birth control/protection: Post-menopausal  ?Other Topics Concern  ? Not on file  ?Social History Narrative  ? Pt is 76yo female. Pt was born in [place], moved to [place] when she was 26.  Pt is married to husband of 22 years.  Pt has 2 sons from a previous marriage and 4 step sons and 26 Grandchildren.   ?   ?  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   ?   ?  Pt works out at gym 3/week (2 days a week in a class, 1 day a week with an Dietitian).  Pt enjoys reading, spending time with her family. Pt and her husband are members of [employer] and active in their church.   ? ?Social Determinants of Health  ? ?Financial Resource Strain: Not on file  ?Food Insecurity: Not on file  ?Transportation Needs: Not on file  ?Physical Activity: Not on file  ?Stress: Not on file  ?Social Connections: Not on file  ?  ? ?Family History: ?The patient's family history includes Breast cancer in her cousin; Breast cancer (age of onset: 51) in her maternal aunt and paternal aunt; Cancer in her maternal aunt; Heart disease in her mother; Heart disease (age of onset: 75) in her father; Hypertension in her mother; Lung cancer in her father; Other in her brother and another family member; Stroke in her mother. ? ?ROS:   ?Please see the history of present illness.    ? All other systems reviewed and are negative. ? ?EKGs/Labs/Other  Studies Reviewed:   ? ?The following studies were reviewed today: ? ? ?EKG:  EKG is ordered today.  EKG shows normal sinus rhythm, left bundle branch block. ? ?Recent Labs: ?12/31/2021: Hemoglobin 13.2; Platelets 197.0; TSH 1.36 ?02/16/2022: ALT 14; BUN 22; Creatinine, Ser 0.68; Potassium 4.3; Sodium 141  ?Recent Lipid Panel ?   ?Component Value Date/Time  ? CHOL 149 10/01/2021 0823  ? TRIG 56 10/01/2021 0823  ? HDL 67 10/01/2021 0823  ? CHOLHDL 2.2 10/01/2021 0823  ? CHOLHDL 2 06/29/2021 0918  ? VLDL  15.4 06/29/2021 0918  ? [place] 70 10/01/2021 0823  ? LDLDIRECT 100.2 07/30/2008 1017  ? ? ?Physical Exam:   ? ?VS:  BP 128/90 (BP Location: Left Arm, Patient Position: Sitting, Cuff Size: Normal)   Pulse 82   Ht '5\' 2"'$  (1.575 m)   Wt 169 lb 2 oz (76.7 kg)   SpO2 97%   BMI 30.93 kg/m?    ? ?Wt Readings from Last 3 Encounters:  ?03/12/22 169 lb 2 oz (76.7 kg)  ?02/18/22 172 lb (78 kg)  ?01/05/22 168 lb 1.9 oz (76.3 kg)  ?  ? ?GEN:  Well nourished, well developed in no acute distress ?HEENT: Normal ?NECK: No JVD; No carotid bruits ?LYMPHATICS: No lymphadenopathy ?CARDIAC: RRR, no murmurs, rubs, gallops ?RESPIRATORY:  Clear to auscultation without rales, wheezing or rhonchi  ?ABDOMEN: Soft, non-tender, non-distended ?MUSCULOSKELETAL:  No edema; No deformity  ?SKIN: Warm and dry ?NEUROLOGIC:  Alert and oriented x 3 ?PSYCHIATRIC:  Normal affect  ? ?ASSESSMENT:   ? ?1. Coronary artery disease involving native coronary artery of native heart without angina pectoris   ?2. Cardiomyopathy, unspecified type ([place])   ?3. Primary hypertension   ? ?PLAN:   ? ?In order of problems listed above: ? ?CAD, calcifications in the RCA and LAD. no chest pain.  Echocardiogram showed moderate to severely reduced EF 30 to 35%.  Plan left heart cath to evaluate ischemic cardiomyopathy.  Continue aspirin, Crestor 20 mg.  LDL at goal, cholesterol well controlled. ?Cardiomyopathy, EF 30 to 35%.  Schedule left heart cath as above.  Stop amlodipine,  start Toprol-XL 25 mg daily. ?Hypertension, BP controlled.  Stop amlodipine, start Toprol-XL 25 mg daily. ? ?Follow-up after left heart cath in 4 to 6 weeks. ? ? ? ?Medication Adjustments/Labs and Tests Ordered: ?Current

## 2022-03-12 NOTE — Progress Notes (Signed)
?Cardiology Office Note:   ? ?Date:  03/12/2022  ? ?ID:  Valerie Curry, DOB 1946/06/05, MRN [id number] ? ?PCP:  Burnard Hawthorne, FNP  ?Cardiologist:  Kate Sable, MD  ?Electrophysiologist:  None  ? ?Referring MD: Burnard Hawthorne, FNP  ? ?Chief Complaint  ?Patient presents with  ? Other  ?  F/u echo/possible cath no complaints today. Meds reviewed verbally with pt.  ? ? ?History of Present Illness:   ? ?Valerie Curry is a 76 y.o. female with a hx of hypertension, CAD (calcifications in RCA, LAD on chest CT, former smoker x40+ years who presents follow-up.  ? ?Patient previously seen due to coronary artery disease.  Echocardiogram was obtained to evaluate any structural abnormalities.  Denies chest pain or shortness of breath.  Presents for echocardiogram results.  Denies chest pain or shortness of breath.  Denies edema. ? ? ?Prior notes ?CT chest lung cancer screening 11/2018 showed calcified plaque in LAD and RCA. 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 ? ? ?Past Medical History:  ?Diagnosis Date  ? Arthritis   ? Car sickness   ? Complication of anesthesia   ? hard to wake up  ? Coronary artery disease 11/2018  ? calcification in LAD and RCA in screening lung scan   ? Family history of malignant neoplasm of breast   ? Maternal/paternal aunts and cousin  ? Hypertension   ? Left bundle branch block   ? Lump or mass in breast 2011/2012  ? benign  ? Obesity, unspecified   ? Osteopenia   ? took Boniva 1 1/2 years in past-doesn't want to restart it  ? Personal history of tobacco use, presenting hazards to health   ? PONV (postoperative nausea and vomiting)   ? Positive PPD   ? as a child. xray was normal.  ? Systemic sclerosis ([place])   ? ? ?Past Surgical History:  ?Procedure Laterality Date  ? APPENDECTOMY  1961  ? BREAST BIOPSY Right Nov 2011  ? benign  ? BREAST BIOPSY Left Nov 2012  ? dilated ducts-benign  ? BUNIONECTOMY Bilateral 2010  ? x 2  ? CHOLECYSTECTOMY N/A 10/03/2019  ? Procedure:  LAPAROSCOPIC CHOLECYSTECTOMY;  Surgeon: Fredirick Maudlin, MD;  Location: [facility];  Service: General;  Laterality: N/A;  ? COLONOSCOPY  8/05  ? Dr. Vira Agar  ? COLONOSCOPY WITH PROPOFOL N/A 01/10/2019  ? Procedure: COLONOSCOPY WITH PROPOFOL;  Surgeon: Virgel Manifold, MD;  Location: [facility];  Service: Endoscopy;  Laterality: N/A;  ? DECOMPRESSIVE LUMBAR LAMINECTOMY LEVEL 2 N/A 04/30/2015  ? Procedure: CENTRAL DECOMPRESSIVE LUMBER LAMINECTOMY WITH POSSIBLE FORAMINOTOMIES L3-L4,L4-L5;  Surgeon: Latanya Maudlin, MD;  [facility];  Service: Orthopedics;  Laterality: N/A;  ? ESOPHAGOGASTRODUODENOSCOPY (EGD) WITH PROPOFOL N/A 04/07/2021  ? Procedure: ESOPHAGOGASTRODUODENOSCOPY (EGD) WITH PROPOFOL;  Surgeon: Virgel Manifold, MD;  Location: [place];  Service: Endoscopy;  Laterality: N/A;  ? POLYPECTOMY  04/07/2021  ? Procedure: POLYPECTOMY;  Surgeon: Virgel Manifold, MD;  Location: [place];  Service: Endoscopy;;  ? TONSILLECTOMY  1968  ? TUBAL LIGATION  1974  ? ? ?Current Medications: ?Current Meds  ?Medication Sig  ? Ascorbic Acid 500 MG CHEW Chew by mouth daily.  ? aspirin EC 81 MG tablet Take 81 mg by mouth daily.  ? Cholecalciferol (VITAMIN D) 50 MCG (2000 UT) CAPS Take 1,000 Units by mouth daily.  ? meloxicam (MOBIC) 15 MG tablet Take 15 mg by mouth as needed.  ? metoprolol succinate (  TOPROL XL) 25 MG 24 hr tablet Take 1 tablet (25 mg total) by mouth daily.  ? Multiple Vitamins-Minerals (PRESERVISION AREDS 2 PO) Take by mouth daily.  ? Probiotic Product (PROBIOTIC BLEND) CAPS   ? rosuvastatin (CRESTOR) 20 MG tablet Take 1 tablet (20 mg total) by mouth every evening.  ? [DISCONTINUED] amLODipine (NORVASC) 5 MG tablet Take 1 tablet (5 mg total) by mouth daily.  ?  ? ?Allergies:   Patient has no known allergies.  ? ?Social History  ? ?Socioeconomic History  ? Marital status: Married  ?  Spouse name: Not on file  ? Number of children: 2  ? Years of education: 61  ? Highest  education level: Not on file  ?Occupational History  ? Occupation: Web designer  ?  Employer: [employer]  ?Tobacco Use  ? Smoking status: Former  ?  Packs/day: 1.00  ?  Years: 50.00  ?  Pack years: 50.00  ?  Types: Cigarettes  ?  Quit date: 11/29/2014  ?  Years since quitting: 7.2  ? Smokeless tobacco: Former  ?  Quit date: 04/30/2015  ?Vaping Use  ? Vaping Use: Never used  ?Substance and Sexual Activity  ? Alcohol use: Yes  ?  Alcohol/week: 0.0 standard drinks  ?  Comment: occasional glassof wine   ? Drug use: No  ? Sexual activity: Yes  ?  Birth control/protection: Post-menopausal  ?Other Topics Concern  ? Not on file  ?Social History Narrative  ? Pt is 76yo female. Pt was born in [place], moved to [place] when she was 5.  Pt is married to husband of 22 years.  Pt has 2 sons from a previous marriage and 4 step sons and 61 Grandchildren.   ?   ?  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   ?   ?  Pt works out at gym 3/week (2 days a week in a class, 1 day a week with an Dietitian).  Pt enjoys reading, spending time with her family. Pt and her husband are members of [employer] and active in their church.   ? ?Social Determinants of Health  ? ?Financial Resource Strain: Not on file  ?Food Insecurity: Not on file  ?Transportation Needs: Not on file  ?Physical Activity: Not on file  ?Stress: Not on file  ?Social Connections: Not on file  ?  ? ?Family History: ?The patient's family history includes Breast cancer in her cousin; Breast cancer (age of onset: 35) in her maternal aunt and paternal aunt; Cancer in her maternal aunt; Heart disease in her mother; Heart disease (age of onset: 47) in her father; Hypertension in her mother; Lung cancer in her father; Other in her brother and another family member; Stroke in her mother. ? ?ROS:   ?Please see the history of present illness.    ? All other systems reviewed and are negative. ? ?EKGs/Labs/Other  Studies Reviewed:   ? ?The following studies were reviewed today: ? ? ?EKG:  EKG is ordered today.  EKG shows normal sinus rhythm, left bundle branch block. ? ?Recent Labs: ?12/31/2021: Hemoglobin 13.2; Platelets 197.0; TSH 1.36 ?02/16/2022: ALT 14; BUN 22; Creatinine, Ser 0.68; Potassium 4.3; Sodium 141  ?Recent Lipid Panel ?   ?Component Value Date/Time  ? CHOL 149 10/01/2021 0823  ? TRIG 56 10/01/2021 0823  ? HDL 67 10/01/2021 0823  ? CHOLHDL 2.2 10/01/2021 0823  ? CHOLHDL 2 06/29/2021 0918  ? VLDL  15.4 06/29/2021 0918  ? [place] 70 10/01/2021 0823  ? LDLDIRECT 100.2 07/30/2008 1017  ? ? ?Physical Exam:   ? ?VS:  BP 128/90 (BP Location: Left Arm, Patient Position: Sitting, Cuff Size: Normal)   Pulse 82   Ht '5\' 2"'$  (1.575 m)   Wt 169 lb 2 oz (76.7 kg)   SpO2 97%   BMI 30.93 kg/m?    ? ?Wt Readings from Last 3 Encounters:  ?03/12/22 169 lb 2 oz (76.7 kg)  ?02/18/22 172 lb (78 kg)  ?01/05/22 168 lb 1.9 oz (76.3 kg)  ?  ? ?GEN:  Well nourished, well developed in no acute distress ?HEENT: Normal ?NECK: No JVD; No carotid bruits ?LYMPHATICS: No lymphadenopathy ?CARDIAC: RRR, no murmurs, rubs, gallops ?RESPIRATORY:  Clear to auscultation without rales, wheezing or rhonchi  ?ABDOMEN: Soft, non-tender, non-distended ?MUSCULOSKELETAL:  No edema; No deformity  ?SKIN: Warm and dry ?NEUROLOGIC:  Alert and oriented x 3 ?PSYCHIATRIC:  Normal affect  ? ?ASSESSMENT:   ? ?1. Coronary artery disease involving native coronary artery of native heart without angina pectoris   ?2. Cardiomyopathy, unspecified type ([place])   ?3. Primary hypertension   ? ?PLAN:   ? ?In order of problems listed above: ? ?CAD, calcifications in the RCA and LAD. no chest pain.  Echocardiogram showed moderate to severely reduced EF 30 to 35%.  Plan left heart cath to evaluate ischemic cardiomyopathy.  Continue aspirin, Crestor 20 mg.  LDL at goal, cholesterol well controlled. ?Cardiomyopathy, EF 30 to 35%.  Schedule left heart cath as above.  Stop amlodipine,  start Toprol-XL 25 mg daily. ?Hypertension, BP controlled.  Stop amlodipine, start Toprol-XL 25 mg daily. ? ?Follow-up after left heart cath in 4 to 6 weeks. ? ? ? ?Medication Adjustments/Labs and Tests Ordered: ?Current

## 2022-03-12 NOTE — Patient Instructions (Signed)
Medication Instructions:  ? ?Your physician has recommended you make the following change in your medication:  ? ? STOP taking your Amlodipine. ? ?2.    START taking Metoprolol Succinate (Toprol XL) 25 MG once a day. ? ? ?*If you need a refill on your cardiac medications before your next appointment, please call your pharmacy* ? ? ?Lab Work: ? ?CBC, BMP to be drawn in office today. ? ?If you have labs (blood work) drawn today and your tests are completely normal, you will receive your results only by: ?MyChart Message (if you have MyChart) OR ?A paper copy in the mail ?If you have any lab test that is abnormal or we need to change your treatment, we will call you to review the results. ? ? ?Testing/Procedures: ? ?You are scheduled for a Cardiac Catheterization on Wednesday, April 19 with Dr. Harrell Gave End. ? ?1. Please arrive at the [facility]  at 12:30 PM (This time is two hours before your procedure to ensure your preparation). Free valet parking service is available.  ? ?Special note: Every effort is made to have your procedure done on time. Please understand that emergencies sometimes delay scheduled procedures. ? ?2. Diet: Do not eat solid foods after midnight.  You may have clear liquids until 5 AM upon the day of the procedure. ? ?3. Labs: Drawn in office today. ? ?4. Medication instructions in preparation for your procedure: ? ? Contrast Allergy: No ? ?On the morning of your procedure, take Aspirin 81 MG and any morning medicines NOT listed above.  You may use sips of water. ? ?5. Plan to go home the same day, you will only stay overnight if medically necessary. ?6. You MUST have a responsible adult to drive you home. ?7. An adult MUST be with you the first 24 hours after you arrive home. ?8. Bring a current list of your medications, and the last time and date medication taken. ?9. Bring ID and current insurance cards. ?10.Please wear clothes that are easy to get on and off and wear slip-on  shoes. ? ?Thank you for allowing [place] to care for you! ?  -- [facility] ? ? ? ?Follow-Up: ?At [facility], you and your health needs are our priority.  As part of our continuing mission to provide you with exceptional heart care, we have created designated Provider Care Teams.  These Care Teams include your primary Cardiologist (physician) and Advanced Practice Providers (APPs -  Physician Assistants and Nurse Practitioners) who all work together to provide you with the care you need, when you need it. ? ?We recommend signing up for the patient portal called "MyChart".  Sign up information is provided on this After Visit Summary.  MyChart is used to connect with patients for Virtual Visits (Telemedicine).  Patients are able to view lab/test results, encounter notes, upcoming appointments, etc.  Non-urgent messages can be sent to your provider as well.   ?To learn more about what you can do with MyChart, go to [email or web address].   ? ?Your next appointment:   ?4-6 week(s) ? ?The format for your next appointment:   ?In Person ? ?Provider:   ?You may see Kate Sable, MD or one of the following Advanced Practice Providers on your designated Care Team:   ?Murray Hodgkins, NP ?Christell Faith, PA-C ?Cadence Kathlen Mody, PA-C  ? ? ? ?Important Information About [place] ? ? ? ? ? ? ?

## 2022-03-12 NOTE — Telephone Encounter (Signed)
Called patient and scheduled her to come in today at 3:20 to see Dr. Garen Lah. ?

## 2022-03-12 NOTE — Telephone Encounter (Signed)
-----   Message from Kate Sable, MD sent at 03/12/2022  8:40 AM EDT ----- ?Echocardiogram shows moderate to severely reduced ejection fraction.  Please schedule follow-up appointment as soon as possible, patient will likely need a left heart cath.  Continue aspirin, Lipitor as prescribed.  We will start medications for cardiomyopathy at follow-up visit. ?

## 2022-03-13 LAB — BASIC METABOLIC PANEL
BUN/Creatinine Ratio: 28 (ref 12–28)
BUN: 20 mg/dL (ref 8–27)
CO2: 26 mmol/L (ref 20–29)
Calcium: 9.5 mg/dL (ref 8.7–10.3)
Chloride: 108 mmol/L — ABNORMAL HIGH (ref 96–106)
Creatinine, Ser: 0.72 mg/dL (ref 0.57–1.00)
Glucose: 84 mg/dL (ref 70–99)
Potassium: 4.4 mmol/L (ref 3.5–5.2)
Sodium: 146 mmol/L — ABNORMAL HIGH (ref 134–144)
eGFR: 87 mL/min/{1.73_m2} (ref >59)

## 2022-03-13 LAB — CBC
Hematocrit: 39.7 % (ref 34.0–46.6)
Hemoglobin: 13.5 g/dL (ref 11.1–15.9)
MCH: 31 pg (ref 26.6–33.0)
MCHC: 34 g/dL (ref 31.5–35.7)
MCV: 91 fL (ref 79–97)
Platelets: 196 10*3/uL (ref 150–450)
RBC: 4.35 x10E6/uL (ref 3.77–5.28)
RDW: 12.9 % (ref 11.7–15.4)
WBC: 5.2 10*3/uL (ref 3.4–10.8)

## 2022-03-15 ENCOUNTER — Telehealth: Payer: Self-pay | Admitting: Internal Medicine

## 2022-03-15 NOTE — Telephone Encounter (Signed)
Called patient and informed her that as long as she was not running a fever that she would be able to proceed with her scheduled procedure. Patient also stated that she tested negative for Covid. Patient verbalized understanding and was grateful for the call back. ?

## 2022-03-15 NOTE — Telephone Encounter (Signed)
Patient states she has a cold and she would like to know if she will need to reschedule 4/19 heart cath with Dr. Saunders Revel. Please advise. ?

## 2022-03-15 NOTE — Telephone Encounter (Signed)
This is an Dr. Garen Lah pt. ?

## 2022-03-17 ENCOUNTER — Encounter: Payer: Self-pay | Admitting: Internal Medicine

## 2022-03-17 ENCOUNTER — Ambulatory Visit
Admission: RE | Admit: 2022-03-17 | Discharge: 2022-03-17 | Disposition: A | Discharge status: Home / Self Care | Payer: PPO | Attending: Internal Medicine | Admitting: Internal Medicine

## 2022-03-17 ENCOUNTER — Encounter
Admission: RE | Disposition: A | Discharge status: Home / Self Care | Payer: Self-pay | Source: Home / Self Care | Attending: Internal Medicine

## 2022-03-17 ENCOUNTER — Other Ambulatory Visit: Payer: Self-pay

## 2022-03-17 DIAGNOSIS — Z87891 Personal history of nicotine dependence: Secondary | ICD-10-CM | POA: Insufficient documentation

## 2022-03-17 DIAGNOSIS — I110 Hypertensive heart disease with heart failure: Secondary | ICD-10-CM | POA: Diagnosis not present

## 2022-03-17 DIAGNOSIS — I5020 Unspecified systolic (congestive) heart failure: Secondary | ICD-10-CM | POA: Diagnosis present

## 2022-03-17 DIAGNOSIS — I429 Cardiomyopathy, unspecified: Secondary | ICD-10-CM | POA: Insufficient documentation

## 2022-03-17 DIAGNOSIS — Z79899 Other long term (current) drug therapy: Secondary | ICD-10-CM | POA: Insufficient documentation

## 2022-03-17 DIAGNOSIS — I2510 Atherosclerotic heart disease of native coronary artery without angina pectoris: Secondary | ICD-10-CM | POA: Diagnosis not present

## 2022-03-17 DIAGNOSIS — I5022 Chronic systolic (congestive) heart failure: Secondary | ICD-10-CM | POA: Diagnosis not present

## 2022-03-17 DIAGNOSIS — I428 Other cardiomyopathies: Secondary | ICD-10-CM

## 2022-03-17 HISTORY — PX: LEFT HEART CATH AND CORONARY ANGIOGRAPHY: CATH118249

## 2022-03-17 HISTORY — DX: Unspecified systolic (congestive) heart failure: I50.20

## 2022-03-17 SURGERY — LEFT HEART CATH AND CORONARY ANGIOGRAPHY
Anesthesia: Moderate Sedation | Laterality: Left

## 2022-03-17 MED ORDER — MIDAZOLAM HCL 2 MG/2ML IJ SOLN
INTRAMUSCULAR | Status: AC
  Filled 2022-03-17: qty 2

## 2022-03-17 MED ORDER — SODIUM CHLORIDE 0.9 % IV SOLN: 250.0000 mL | INTRAVENOUS | Status: DC | PRN

## 2022-03-17 MED ORDER — SODIUM CHLORIDE 0.9% FLUSH: 3.0000 mL | Freq: Two times a day (BID) | INTRAVENOUS | Status: DC

## 2022-03-17 MED ORDER — SODIUM CHLORIDE 0.9 % IV SOLN: INTRAVENOUS | Status: DC

## 2022-03-17 MED ORDER — SODIUM CHLORIDE 0.9% FLUSH: 3.0000 mL | INTRAVENOUS | Status: DC | PRN

## 2022-03-17 MED ORDER — ONDANSETRON HCL 4 MG/2ML IJ SOLN: 4.0000 mg | Freq: Four times a day (QID) | INTRAMUSCULAR | Status: DC | PRN

## 2022-03-17 MED ORDER — HEPARIN (PORCINE) IN NACL 1000-0.9 UT/500ML-% IV SOLN
INTRAVENOUS | Status: AC
  Filled 2022-03-17: qty 1000

## 2022-03-17 MED ORDER — HEPARIN (PORCINE) IN NACL 1000-0.9 UT/500ML-% IV SOLN
INTRAVENOUS | Status: DC | PRN
Start: 2022-03-17 — End: 2022-03-17
  Administered 2022-03-17: 1000 mL

## 2022-03-17 MED ORDER — LIDOCAINE HCL 1 % IJ SOLN
INTRAMUSCULAR | Status: AC
  Filled 2022-03-17: qty 20

## 2022-03-17 MED ORDER — MIDAZOLAM HCL 2 MG/2ML IJ SOLN
INTRAMUSCULAR | Status: DC | PRN
Start: 2022-03-17 — End: 2022-03-17
  Administered 2022-03-17: 1 mg via INTRAVENOUS

## 2022-03-17 MED ORDER — HEPARIN SODIUM (PORCINE) 1000 UNIT/ML IJ SOLN
INTRAMUSCULAR | Status: DC | PRN
Start: 2022-03-17 — End: 2022-03-17
  Administered 2022-03-17: 3500 [IU] via INTRAVENOUS

## 2022-03-17 MED ORDER — LABETALOL HCL 5 MG/ML IV SOLN: 10.0000 mg | INTRAVENOUS | Status: DC | PRN

## 2022-03-17 MED ORDER — VERAPAMIL HCL 2.5 MG/ML IV SOLN
INTRAVENOUS | Status: DC | PRN
Start: 2022-03-17 — End: 2022-03-17
  Administered 2022-03-17 (×2): 2.5 mg via INTRA_ARTERIAL

## 2022-03-17 MED ORDER — IOHEXOL 300 MG/ML  SOLN
INTRAMUSCULAR | Status: DC | PRN
  Administered 2022-03-17: 20 mL

## 2022-03-17 MED ORDER — ACETAMINOPHEN 325 MG PO TABS: 650.0000 mg | ORAL_TABLET | ORAL | Status: DC | PRN

## 2022-03-17 MED ORDER — LIDOCAINE HCL (PF) 1 % IJ SOLN
INTRAMUSCULAR | Status: DC | PRN
  Administered 2022-03-17: 2 mL

## 2022-03-17 MED ORDER — HYDRALAZINE HCL 20 MG/ML IJ SOLN: 10.0000 mg | INTRAMUSCULAR | Status: DC | PRN

## 2022-03-17 MED ORDER — FENTANYL CITRATE (PF) 100 MCG/2ML IJ SOLN
INTRAMUSCULAR | Status: AC
  Filled 2022-03-17: qty 2

## 2022-03-17 MED ORDER — FENTANYL CITRATE (PF) 100 MCG/2ML IJ SOLN
INTRAMUSCULAR | Status: DC | PRN
  Administered 2022-03-17: 25 ug via INTRAVENOUS

## 2022-03-17 MED ORDER — HEPARIN SODIUM (PORCINE) 1000 UNIT/ML IJ SOLN
INTRAMUSCULAR | Status: AC
  Filled 2022-03-17: qty 10

## 2022-03-17 MED ORDER — VERAPAMIL HCL 2.5 MG/ML IV SOLN
INTRAVENOUS | Status: AC
  Filled 2022-03-17: qty 2

## 2022-03-17 SURGICAL SUPPLY — 10 items
CATH 5F 110X4 TIG (CATHETERS) ×1 IMPLANT
DEVICE RAD TR BAND REGULAR (VASCULAR PRODUCTS) ×1 IMPLANT
DRAPE BRACHIAL (DRAPES) ×1 IMPLANT
GLIDESHEATH SLEND SS 6F .021 (SHEATH) ×1 IMPLANT
GUIDEWIRE INQWIRE 1.5J.035X260 (WIRE) IMPLANT
INQWIRE 1.5J .035X260CM (WIRE) ×2
PACK CARDIAC CATH (CUSTOM PROCEDURE TRAY) ×2 IMPLANT
PROTECTION STATION PRESSURIZED (MISCELLANEOUS) ×2
SET ATX SIMPLICITY (MISCELLANEOUS) ×1 IMPLANT
STATION PROTECTION PRESSURIZED (MISCELLANEOUS) IMPLANT

## 2022-03-17 NOTE — Interval H&P Note (Signed)
History and Physical Interval Note: ? ?03/17/2022 ?1:37 PM ? ?Valerie Curry  has presented today for surgery, with the diagnosis of HFrEF.  The various methods of treatment have been discussed with the patient and family. After consideration of risks, benefits and other options for treatment, the patient has consented to  Procedure(s): ?LEFT HEART CATH AND CORONARY ANGIOGRAPHY (Left) as a surgical intervention.  The patient's history has been reviewed, patient examined, no change in status, stable for surgery.  I have reviewed the patient's chart and labs.  Questions were answered to the patient's satisfaction.   ? ?Cath Lab Visit (complete for each Cath Lab visit) ? ?Clinical Evaluation Leading to the Procedure:  ? ?ACS: No. ? ?Non-ACS:   ? ?Anginal Classification: No Symptoms ? ?Anti-ischemic medical therapy: Minimal Therapy (1 class of medications) ? ?Non-Invasive Test Results: No non-invasive testing performed (LVEF 30-35% by echo) ? ?Prior CABG: No previous CABG ? ?Valerie Curry ? ? ?

## 2022-03-18 ENCOUNTER — Encounter: Payer: Self-pay | Admitting: Internal Medicine

## 2022-04-22 ENCOUNTER — Ambulatory Visit: Payer: PPO | Admitting: Cardiology

## 2022-04-22 ENCOUNTER — Encounter: Payer: Self-pay | Admitting: Cardiology

## 2022-04-22 VITALS — BP 158/90 | HR 60 | Ht 62.0 in | Wt 173.0 lb

## 2022-04-22 DIAGNOSIS — I429 Cardiomyopathy, unspecified: Secondary | ICD-10-CM

## 2022-04-22 DIAGNOSIS — I10 Essential (primary) hypertension: Secondary | ICD-10-CM | POA: Diagnosis not present

## 2022-04-22 DIAGNOSIS — I2510 Atherosclerotic heart disease of native coronary artery without angina pectoris: Secondary | ICD-10-CM

## 2022-04-22 MED ORDER — ENTRESTO 24-26 MG PO TABS: 1.0000 | ORAL_TABLET | Freq: Two times a day (BID) | ORAL | 3 refills | Status: DC

## 2022-04-22 NOTE — Progress Notes (Signed)
Cardiology Office Note:    Date:  04/22/2022   ID:  [place], [place] 1946/06/02, MRN [id number]  PCP:  Burnard Hawthorne, FNP  Cardiologist:  Kate Sable, MD  Electrophysiologist:  None   Referring MD: Burnard Hawthorne, FNP   Chief Complaint  Patient presents with   Follow-up    F/U after cardiac cath    History of Present Illness:    Valerie Curry is a 76 y.o. female with a hx of hypertension, NICM EF 30 to 35%, nonobstructive CAD (LHC 02/2022-20% RCA and LAD) , former smoker x40+ years who presents follow-up.   Patient was previously seen due to cardiomyopathy, left heart cath was performed to evaluate ischemia.  Left heart cath showed nonobstructive CAD.  Patient had easy bruisability with aspirin, aspirin was stopped.  Denies chest pain, denies shortness of breath, denies edema.  Started on Toprol-XL, tolerating without any adverse effects.   Prior notes CT chest lung cancer screening 11/2018 showed calcified plaque in LAD and RCA. 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   Past Medical History:  Diagnosis Date   Arthritis    Car sickness    Complication of anesthesia    hard to wake up   Coronary artery disease 11/2018   calcification in LAD and RCA in screening lung scan    Family history of malignant neoplasm of breast    Maternal/paternal aunts and cousin   Hypertension    Left bundle branch block    Lump or mass in breast 2011/2012   benign   Obesity, unspecified    Osteopenia    took Boniva 1 1/2 years in past-doesn't want to restart it   Personal history of tobacco use, presenting hazards to health    PONV (postoperative nausea and vomiting)    Positive PPD    as a child. xray was normal.   Systemic sclerosis [facility])     Past Surgical History:  Procedure Laterality Date   APPENDECTOMY  1961   BREAST BIOPSY Right 09/2010   benign   BREAST BIOPSY Left 09/2011   dilated ducts-benign   BUNIONECTOMY Bilateral 2010   x 2    CHOLECYSTECTOMY N/A 10/03/2019   Procedure: LAPAROSCOPIC CHOLECYSTECTOMY;  Surgeon: Fredirick Maudlin, MD;  Location: [facility];  Service: General;  Laterality: N/A;   COLONOSCOPY  06/2004   Dr. Vira Agar   COLONOSCOPY WITH PROPOFOL N/A 01/10/2019   Procedure: COLONOSCOPY WITH PROPOFOL;  Surgeon: Virgel Manifold, MD;  Location: [facility];  Service: Endoscopy;  Laterality: N/A;   DECOMPRESSIVE LUMBAR LAMINECTOMY LEVEL 2 N/A 04/30/2015   Procedure: CENTRAL DECOMPRESSIVE LUMBER LAMINECTOMY WITH POSSIBLE FORAMINOTOMIES L3-L4,L4-L5;  Surgeon: Latanya Maudlin, MD;  [facility];  Service: Orthopedics;  Laterality: N/A;   ERCP W/ SPHICTEROTOMY     ESOPHAGOGASTRODUODENOSCOPY (EGD) WITH PROPOFOL N/A 04/07/2021   Procedure: ESOPHAGOGASTRODUODENOSCOPY (EGD) WITH PROPOFOL;  Surgeon: Virgel Manifold, MD;  Location: [place];  Service: Endoscopy;  Laterality: N/A;   LEFT HEART CATH AND CORONARY ANGIOGRAPHY Left 03/17/2022   Procedure: LEFT HEART CATH AND CORONARY ANGIOGRAPHY;  Surgeon: Nelva Bush, MD;  Location: [place] [facility];  Service: Cardiovascular;  Laterality: Left;   POLYPECTOMY  04/07/2021   Procedure: POLYPECTOMY;  Surgeon: Virgel Manifold, MD;  Location: [place];  Service: Endoscopy;;   TONSILLECTOMY  1968   TUBAL LIGATION  1974    Current Medications: Current Meds  Medication Sig   acetaminophen (TYLENOL) 500 MG tablet Take 500-1,000  mg by mouth every 6 (six) hours as needed for moderate pain.   Ascorbic Acid 500 MG CHEW Chew 500 mg by mouth daily.   MELATONIN PO Take 12 mg by mouth at bedtime as needed (sleep).   meloxicam (MOBIC) 15 MG tablet Take 15 mg by mouth daily as needed for pain.   metoprolol succinate (TOPROL XL) 25 MG 24 hr tablet Take 1 tablet (25 mg total) by mouth daily.   Multiple Vitamins-Minerals (PRESERVISION AREDS 2 PO) Take 1 tablet by mouth 2 (two) times daily.   rosuvastatin (CRESTOR) 20 MG tablet Take 1 tablet (20  mg total) by mouth every evening.   sacubitril-valsartan (ENTRESTO) 24-26 MG Take 1 tablet by mouth 2 (two) times daily.     Allergies:   Patient has no known allergies.   Social History   Socioeconomic History   Marital status: Married    Spouse name: Not on file   Number of children: 2   Years of education: 15   Highest education level: Not on file  Occupational History   Occupation: Facilities manager: [employer]  Tobacco Use   Smoking status: Former    Packs/day: 1.00    Years: 50.00    Pack years: 50.00    Types: Cigarettes    Quit date: 11/29/2014    Years since quitting: 7.4   Smokeless tobacco: Former    Quit date: 04/30/2015  Vaping Use   Vaping Use: Never used  Substance and Sexual Activity   Alcohol use: Yes    Comment: occasional glass of wine   Drug use: No   Sexual activity: Yes    Birth control/protection: Post-menopausal  Other Topics Concern   Not on file  Social History Narrative   Pt is 76 yo female. Pt was born in [place], moved to [place] when she was 75.  Pt is married to husband of 22 years.  Pt has 2 sons from a previous marriage and 4 step sons and 60 Grandchildren.        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        Pt works out at gym 3/week (2 days a week in a class, 1 day a week with an Dietitian).  Pt enjoys reading, spending time with her family. Pt and her husband are members of [employer] and active in their church.    Social Determinants of Health   Financial Resource Strain: Not on file  Food Insecurity: Not on file  Transportation Needs: Not on file  Physical Activity: Not on file  Stress: Not on file  Social Connections: Not on file     Family History: The patient's family history includes Breast cancer in her cousin; Breast cancer (age of onset: 66) in her maternal aunt and paternal aunt; Cancer in her maternal aunt; Heart disease in her mother;  Heart disease (age of onset: 24) in her father; Hypertension in her mother; Lung cancer in her father; Other in her brother and another family member; Stroke in her mother.  ROS:   Please see the history of present illness.     All other systems reviewed and are negative.  EKGs/Labs/Other Studies Reviewed:    The following studies were reviewed today:   EKG:  EKG is ordered today.  EKG shows normal sinus rhythm, left bundle branch block.  Recent Labs: 12/31/2021: TSH 1.36 02/16/2022: ALT 14 03/12/2022: BUN 20; Creatinine, Ser 0.72; Hemoglobin  13.5; Platelets 196; Potassium 4.4; Sodium 146  Recent Lipid Panel    Component Value Date/Time   CHOL 149 10/01/2021 0823   TRIG 56 10/01/2021 0823   HDL 67 10/01/2021 0823   CHOLHDL 2.2 10/01/2021 0823   CHOLHDL 2 06/29/2021 0918   VLDL 15.4 06/29/2021 0918   LDLCALC 70 10/01/2021 0823   LDLDIRECT 100.2 07/30/2008 1017    Physical Exam:    VS:  BP (!) 158/90 (BP Location: Left Arm, Patient Position: Sitting, Cuff Size: Large)   Pulse 60   Ht '5\' 2"'$  (1.575 m)   Wt 173 lb (78.5 kg)   SpO2 97%   BMI 31.64 kg/m     Wt Readings from Last 3 Encounters:  04/22/22 173 lb (78.5 kg)  03/17/22 165 lb (74.8 kg)  03/12/22 169 lb 2 oz (76.7 kg)     GEN:  Well nourished, well developed in no acute distress HEENT: Normal NECK: No JVD; No carotid bruits LYMPHATICS: No lymphadenopathy CARDIAC: RRR, no murmurs, rubs, gallops RESPIRATORY:  Clear to auscultation without rales, wheezing or rhonchi  ABDOMEN: Soft, non-tender, non-distended MUSCULOSKELETAL:  No edema; No deformity  SKIN: Warm and dry NEUROLOGIC:  Alert and oriented x 3 PSYCHIATRIC:  Normal affect   ASSESSMENT:    1. Coronary artery disease involving native coronary artery of native heart without angina pectoris   2. Cardiomyopathy, unspecified type ([place])   3. Primary hypertension     PLAN:    In order of problems listed above:  Nonobstructive CAD, LHC 02/2022- 20%  proximal LAD and mid RCA ).  Echocardiogram showed moderate to severely reduced EF 30 to 35%.  Continue aspirin, Crestor 20 mg.  LDL at goal. Nonischemic cardiomyopathy, EF 30 to 35%.  Start Entresto 24-26 mg twice daily, continue Toprol-XL 25 mg daily.  Appears euvolemic. Hypertension, BP elevated.  Start [place], continue Toprol-XL as above.  Follow-up  in 4 to 6 weeks for medication titration.    Medication Adjustments/Labs and Tests Ordered: Current medicines are reviewed at length with the patient today.  Concerns regarding medicines are outlined above.  Orders Placed This Encounter  Procedures   EKG 12-Lead   Meds ordered this encounter  Medications   sacubitril-valsartan (ENTRESTO) 24-26 MG    Sig: Take 1 tablet by mouth 2 (two) times daily.    Dispense:  60 tablet    Refill:  3    Patient Instructions  Medication Instructions:   Your physician has recommended you make the following change in your medication:    START taking Entresto 24-26 MG TWICE a day.  *If you need a refill on your cardiac medications before your next appointment, please call your pharmacy*   Lab Work:  None ordered  If you have labs (blood work) drawn today and your tests are completely normal, you will receive your results only by: [place] (if you have MyChart) OR A paper copy in the mail If you have any lab test that is abnormal or we need to change your treatment, we will call you to review the results.   Testing/Procedures: None ordered   Follow-Up: At [facility], you and your health needs are our priority.  As part of our continuing mission to provide you with exceptional heart care, we have created designated Provider Care Teams.  These Care Teams include your primary Cardiologist (physician) and Advanced Practice Providers (APPs -  Physician Assistants and Nurse Practitioners) who all work together to provide you with the care you  need, when you need it.  We recommend  signing up for the patient portal called "MyChart".  Sign up information is provided on this After Visit Summary.  MyChart is used to connect with patients for Virtual Visits (Telemedicine).  Patients are able to view lab/test results, encounter notes, upcoming appointments, etc.  Non-urgent messages can be sent to your provider as well.   To learn more about what you can do with MyChart, go to [email or web address].    Your next appointment:   7 week(s)  The format for your next appointment:   In Person  Provider:   Kate Sable, MD    Other Instructions   Important Information About Sugar         Signed, Kate Sable, MD  04/22/2022 12:25 PM    [place]

## 2022-04-22 NOTE — Patient Instructions (Signed)
Medication Instructions:   Your physician has recommended you make the following change in your medication:    START taking Entresto 24-26 MG TWICE a day.  *If you need a refill on your cardiac medications before your next appointment, please call your pharmacy*   Lab Work:  None ordered  If you have labs (blood work) drawn today and your tests are completely normal, you will receive your results only by: [place] (if you have MyChart) OR A paper copy in the mail If you have any lab test that is abnormal or we need to change your treatment, we will call you to review the results.   Testing/Procedures: None ordered   Follow-Up: At [facility], you and your health needs are our priority.  As part of our continuing mission to provide you with exceptional heart care, we have created designated Provider Care Teams.  These Care Teams include your primary Cardiologist (physician) and Advanced Practice Providers (APPs -  Physician Assistants and Nurse Practitioners) who all work together to provide you with the care you need, when you need it.  We recommend signing up for the patient portal called "MyChart".  Sign up information is provided on this After Visit Summary.  MyChart is used to connect with patients for Virtual Visits (Telemedicine).  Patients are able to view lab/test results, encounter notes, upcoming appointments, etc.  Non-urgent messages can be sent to your provider as well.   To learn more about what you can do with MyChart, go to [email or web address].    Your next appointment:   7 week(s)  The format for your next appointment:   In Person  Provider:   Kate Sable, MD    Other Instructions   Important Information About Sugar

## 2022-06-17 ENCOUNTER — Ambulatory Visit: Payer: PPO | Admitting: Cardiology

## 2022-06-17 ENCOUNTER — Encounter: Payer: Self-pay | Admitting: Cardiology

## 2022-06-17 VITALS — BP 120/80 | HR 67 | Ht 62.0 in | Wt 170.5 lb

## 2022-06-17 DIAGNOSIS — I429 Cardiomyopathy, unspecified: Secondary | ICD-10-CM

## 2022-06-17 DIAGNOSIS — I10 Essential (primary) hypertension: Secondary | ICD-10-CM | POA: Diagnosis not present

## 2022-06-17 DIAGNOSIS — I2510 Atherosclerotic heart disease of native coronary artery without angina pectoris: Secondary | ICD-10-CM

## 2022-06-17 MED ORDER — METOPROLOL SUCCINATE ER 25 MG PO TB24: 25.0000 mg | ORAL_TABLET | Freq: Every day | ORAL | 1 refills | Status: DC

## 2022-06-17 MED ORDER — METOPROLOL SUCCINATE ER 25 MG PO TB24: 25.0000 mg | ORAL_TABLET | Freq: Every day | ORAL | 3 refills | Status: DC

## 2022-06-17 MED ORDER — LOSARTAN POTASSIUM 25 MG PO TABS: 25.0000 mg | ORAL_TABLET | Freq: Every day | ORAL | 3 refills | Status: DC

## 2022-06-17 NOTE — Progress Notes (Signed)
Cardiology Office Note:    Date:  06/17/2022   ID:  [place], [place] 1946-06-17, MRN [id number]  PCP:  Burnard Hawthorne, FNP  Cardiologist:  Kate Sable, MD  Electrophysiologist:  None   Referring MD: Burnard Hawthorne, FNP   Chief Complaint  Patient presents with   Other    8 wk f/u pt would like to discuss Entresto causing fatigue/coughing/itching. Meds reviewed verbally with pt.    History of Present Illness:    Valerie Curry is a 76 y.o. female with a hx of hypertension, NICM EF 30 to 35%, nonobstructive CAD (LHC 02/2022-20% RCA and LAD) , former smoker x40+ years who presents follow-up.   Being seen for nonischemic cardiomyopathy, medication titration.  Started on [place] after last visit, Toprol-XL 25 mg daily was continued.  She states feeling fatigued, coughing and also itching since starting Entresto.  Blood pressures are adequately controlled, otherwise feels okay.   Prior notes CT chest lung cancer screening 11/2018 showed calcified plaque in LAD and RCA. 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   Past Medical History:  Diagnosis Date   Arthritis    Car sickness    Complication of anesthesia    hard to wake up   Coronary artery disease 11/2018   calcification in LAD and RCA in screening lung scan    Family history of malignant neoplasm of breast    Maternal/paternal aunts and cousin   Hypertension    Left bundle branch block    Lump or mass in breast 2011/2012   benign   Obesity, unspecified    Osteopenia    took Boniva 1 1/2 years in past-doesn't want to restart it   Personal history of tobacco use, presenting hazards to health    PONV (postoperative nausea and vomiting)    Positive PPD    as a child. xray was normal.   Systemic sclerosis [facility])     Past Surgical History:  Procedure Laterality Date   APPENDECTOMY  1961   BREAST BIOPSY Right 09/2010   benign   BREAST BIOPSY Left 09/2011   dilated ducts-benign    BUNIONECTOMY Bilateral 2010   x 2   CHOLECYSTECTOMY N/A 10/03/2019   Procedure: LAPAROSCOPIC CHOLECYSTECTOMY;  Surgeon: Fredirick Maudlin, MD;  Location: [facility];  Service: General;  Laterality: N/A;   COLONOSCOPY  06/2004   Dr. Vira Agar   COLONOSCOPY WITH PROPOFOL N/A 01/10/2019   Procedure: COLONOSCOPY WITH PROPOFOL;  Surgeon: Virgel Manifold, MD;  Location: [facility];  Service: Endoscopy;  Laterality: N/A;   DECOMPRESSIVE LUMBAR LAMINECTOMY LEVEL 2 N/A 04/30/2015   Procedure: CENTRAL DECOMPRESSIVE LUMBER LAMINECTOMY WITH POSSIBLE FORAMINOTOMIES L3-L4,L4-L5;  Surgeon: Latanya Maudlin, MD;  Location: [facility];  Service: Orthopedics;  Laterality: N/A;   ERCP W/ SPHICTEROTOMY     ESOPHAGOGASTRODUODENOSCOPY (EGD) WITH PROPOFOL N/A 04/07/2021   Procedure: ESOPHAGOGASTRODUODENOSCOPY (EGD) WITH PROPOFOL;  Surgeon: Virgel Manifold, MD;  Location: [place];  Service: Endoscopy;  Laterality: N/A;   LEFT HEART CATH AND CORONARY ANGIOGRAPHY Left 03/17/2022   Procedure: LEFT HEART CATH AND CORONARY ANGIOGRAPHY;  Surgeon: Nelva Bush, MD;  Location: [place] [facility];  Service: Cardiovascular;  Laterality: Left;   POLYPECTOMY  04/07/2021   Procedure: POLYPECTOMY;  Surgeon: Virgel Manifold, MD;  Location: [place];  Service: Endoscopy;;   TONSILLECTOMY  1968   TUBAL LIGATION  1974    Current Medications: Current Meds  Medication Sig   acetaminophen (TYLENOL) 500 MG tablet  Take 500-1,000 mg by mouth every 6 (six) hours as needed for moderate pain.   losartan (COZAAR) 25 MG tablet Take 1 tablet (25 mg total) by mouth daily.   MELATONIN PO Take 12 mg by mouth at bedtime as needed (sleep).   meloxicam (MOBIC) 15 MG tablet Take 15 mg by mouth daily as needed for pain.   Multiple Vitamins-Minerals (PRESERVISION AREDS 2 PO) Take 1 tablet by mouth 2 (two) times daily.   psyllium (METAMUCIL) 58.6 % packet Take 1 packet by mouth daily at 6 (six) AM.   rosuvastatin  (CRESTOR) 20 MG tablet Take 1 tablet (20 mg total) by mouth every evening.   [DISCONTINUED] metoprolol succinate (TOPROL XL) 25 MG 24 hr tablet Take 1 tablet (25 mg total) by mouth daily.   [DISCONTINUED] sacubitril-valsartan (ENTRESTO) 24-26 MG Take 1 tablet by mouth 2 (two) times daily.     Allergies:   Patient has no known allergies.   Social History   Socioeconomic History   Marital status: Married    Spouse name: Not on file   Number of children: 2   Years of education: 15   Highest education level: Not on file  Occupational History   Occupation: Facilities manager: [employer]  Tobacco Use   Smoking status: Former    Packs/day: 1.00    Years: 50.00    Total pack years: 50.00    Types: Cigarettes    Quit date: 11/29/2014    Years since quitting: 7.5   Smokeless tobacco: Former    Quit date: 04/30/2015  Vaping Use   Vaping Use: Never used  Substance and Sexual Activity   Alcohol use: Yes    Comment: occasional glass of wine   Drug use: No   Sexual activity: Yes    Birth control/protection: Post-menopausal  Other Topics Concern   Not on file  Social History Narrative   Pt is 76 yo female. Pt was born in [place], moved to [place] when she was 43.  Pt is married to husband of 22 years.  Pt has 2 sons from a previous marriage and 4 step sons and 33 Grandchildren.        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        Pt works out at gym 3/week (2 days a week in a class, 1 day a week with an Dietitian).  Pt enjoys reading, spending time with her family. Pt and her husband are members of [employer] and active in their church.    Social Determinants of Health   Financial Resource Strain: Not on file  Food Insecurity: Not on file  Transportation Needs: Not on file  Physical Activity: Not on file  Stress: Not on file  Social Connections: Not on file     Family History: The patient's family  history includes Breast cancer in her cousin; Breast cancer (age of onset: 43) in her maternal aunt and paternal aunt; Cancer in her maternal aunt; Heart disease in her mother; Heart disease (age of onset: 43) in her father; Hypertension in her mother; Lung cancer in her father; Other in her brother and another family member; Stroke in her mother.  ROS:   Please see the history of present illness.     All other systems reviewed and are negative.  EKGs/Labs/Other Studies Reviewed:    The following studies were reviewed today:   EKG:  EKG not ordered today.  Recent Labs: 12/31/2021: TSH 1.36 02/16/2022: ALT 14 03/12/2022: BUN 20; Creatinine, Ser 0.72; Hemoglobin 13.5; Platelets 196; Potassium 4.4; Sodium 146  Recent Lipid Panel    Component Value Date/Time   CHOL 149 10/01/2021 0823   TRIG 56 10/01/2021 0823   HDL 67 10/01/2021 0823   CHOLHDL 2.2 10/01/2021 0823   CHOLHDL 2 06/29/2021 0918   VLDL 15.4 06/29/2021 0918   LDLCALC 70 10/01/2021 0823   LDLDIRECT 100.2 07/30/2008 1017    Physical Exam:    VS:  BP 120/80 (BP Location: Left Arm, Patient Position: Sitting, Cuff Size: Normal)   Pulse 67   Ht '5\' 2"'$  (1.575 m)   Wt 170 lb 8 oz (77.3 kg)   SpO2 98%   BMI 31.18 kg/m     Wt Readings from Last 3 Encounters:  06/17/22 170 lb 8 oz (77.3 kg)  04/22/22 173 lb (78.5 kg)  03/17/22 165 lb (74.8 kg)     GEN:  Well nourished, well developed in no acute distress HEENT: Normal NECK: No JVD; No carotid bruits CARDIAC: RRR, no murmurs, rubs, gallops RESPIRATORY:  Clear to auscultation without rales, wheezing or rhonchi  ABDOMEN: Soft, non-tender, non-distended MUSCULOSKELETAL:  No edema; No deformity  SKIN: Warm and dry NEUROLOGIC:  Alert and oriented x 3 PSYCHIATRIC:  Normal affect   ASSESSMENT:    1. Coronary artery disease involving native coronary artery of native heart without angina pectoris   2. Cardiomyopathy, unspecified type ([place])   3. Primary hypertension     PLAN:    In order of problems listed above:  Nonobstructive CAD, LHC 02/2022- 20% proximal LAD and mid RCA ).  Echocardiogram showed moderate to severely reduced EF 30 to 35%.  Continue aspirin, Crestor 20 mg.  LDL at goal. Nonischemic cardiomyopathy, EF 30 to 35%.  Stop Entresto due to itching, and fatigue.  Start losartan 25 mg daily, continue Toprol-XL 25 mg daily.  Patient Appears euvolemic.  Check BMP and LFTs with starting losartan due to history of splinter of oddi dysfunction Hypertension, BP controlled.  Toprol-XL, losartan as above.  Follow-up  in 2 months.   Medication Adjustments/Labs and Tests Ordered: Current medicines are reviewed at length with the patient today.  Concerns regarding medicines are outlined above.  Orders Placed This Encounter  Procedures   Hepatic function panel   Basic metabolic panel   Meds ordered this encounter  Medications   DISCONTD: metoprolol succinate (TOPROL XL) 25 MG 24 hr tablet    Sig: Take 1 tablet (25 mg total) by mouth daily.    Dispense:  30 tablet    Refill:  3   losartan (COZAAR) 25 MG tablet    Sig: Take 1 tablet (25 mg total) by mouth daily.    Dispense:  30 tablet    Refill:  3   metoprolol succinate (TOPROL XL) 25 MG 24 hr tablet    Sig: Take 1 tablet (25 mg total) by mouth daily.    Dispense:  90 tablet    Refill:  1    Please fill for a 90 day supply    Patient Instructions  Medication Instructions:   Your physician has recommended you make the following change in your medication:    STOP taking Entresto.  2.    START taking Losartan 25 MG once a day.  *If you need a refill on your cardiac medications before your next appointment, please call your pharmacy*   Lab Work:  Your physician recommends that you  return for lab work (BMP, LFT) in: 1 WEEK  - Please go to the [facility]. You will check in at the front desk to the right as you walk into the atrium. Valet Parking is offered if needed. - No  appointment needed. You may go any day between 7 am and 6 pm.    Follow-Up: At [facility], you and your health needs are our priority.  As part of our continuing mission to provide you with exceptional heart care, we have created designated Provider Care Teams.  These Care Teams include your primary Cardiologist (physician) and Advanced Practice Providers (APPs -  Physician Assistants and Nurse Practitioners) who all work together to provide you with the care you need, when you need it.  We recommend signing up for the patient portal called "MyChart".  Sign up information is provided on this After Visit Summary.  MyChart is used to connect with patients for Virtual Visits (Telemedicine).  Patients are able to view lab/test results, encounter notes, upcoming appointments, etc.  Non-urgent messages can be sent to your provider as well.   To learn more about what you can do with MyChart, go to [email or web address].    Your next appointment:   3 month(s)  The format for your next appointment:   In Person  Provider:   You may see Kate Sable, MD or one of the following Advanced Practice Providers on your designated Care Team:   Murray Hodgkins, NP Christell Faith, PA-C Cadence Kathlen Mody, [place]    Other Instructions   Important Information About Sugar         Signed, Kate Sable, MD  06/17/2022 12:22 PM    [place]

## 2022-06-17 NOTE — Patient Instructions (Signed)
Medication Instructions:   Your physician has recommended you make the following change in your medication:    STOP taking Entresto.  2.    START taking Losartan 25 MG once a day.  *If you need a refill on your cardiac medications before your next appointment, please call your pharmacy*   Lab Work:  Your physician recommends that you return for lab work (BMP, LFT) in: 1 WEEK  - Please go to the [facility]. You will check in at the front desk to the right as you walk into the atrium. Valet Parking is offered if needed. - No appointment needed. You may go any day between 7 am and 6 pm.    Follow-Up: At [facility], you and your health needs are our priority.  As part of our continuing mission to provide you with exceptional heart care, we have created designated Provider Care Teams.  These Care Teams include your primary Cardiologist (physician) and Advanced Practice Providers (APPs -  Physician Assistants and Nurse Practitioners) who all work together to provide you with the care you need, when you need it.  We recommend signing up for the patient portal called "MyChart".  Sign up information is provided on this After Visit Summary.  MyChart is used to connect with patients for Virtual Visits (Telemedicine).  Patients are able to view lab/test results, encounter notes, upcoming appointments, etc.  Non-urgent messages can be sent to your provider as well.   To learn more about what you can do with MyChart, go to [email or web address].    Your next appointment:   3 month(s)  The format for your next appointment:   In Person  Provider:   You may see Kate Sable, MD or one of the following Advanced Practice Providers on your designated Care Team:   Murray Hodgkins, NP Christell Faith, PA-C Cadence Kathlen Mody, [place]    Other Instructions   Important Information About Sugar

## 2022-06-23 ENCOUNTER — Telehealth: Payer: Self-pay | Admitting: Cardiology

## 2022-06-23 NOTE — Telephone Encounter (Signed)
Attempted to remind patient of labs .    Lmov x cell and home

## 2022-06-25 ENCOUNTER — Other Ambulatory Visit
Admission: RE | Admit: 2022-06-25 | Discharge: 2022-06-25 | Disposition: A | Discharge status: Home / Self Care | Payer: PPO | Attending: Cardiology | Admitting: Cardiology

## 2022-06-25 DIAGNOSIS — I429 Cardiomyopathy, unspecified: Secondary | ICD-10-CM | POA: Insufficient documentation

## 2022-06-25 LAB — HEPATIC FUNCTION PANEL
ALT: 17 U/L (ref 0–44)
AST: 23 U/L (ref 15–41)
Albumin: 4 g/dL (ref 3.5–5.0)
Alkaline Phosphatase: 61 U/L (ref 38–126)
Bilirubin, Direct: 0.1 mg/dL (ref 0.0–0.2)
Indirect Bilirubin: 0.8 mg/dL (ref 0.3–0.9)
Total Bilirubin: 0.9 mg/dL (ref 0.3–1.2)
Total Protein: 6.5 g/dL (ref 6.5–8.1)

## 2022-06-25 LAB — BASIC METABOLIC PANEL
Anion gap: 6 (ref 5–15)
BUN: 19 mg/dL (ref 8–23)
CO2: 28 mmol/L (ref 22–32)
Calcium: 8.9 mg/dL (ref 8.9–10.3)
Chloride: 108 mmol/L (ref 98–111)
Creatinine, Ser: 0.7 mg/dL (ref 0.44–1.00)
GFR, Estimated: >60 mL/min (ref >60)
Glucose, Bld: 96 mg/dL (ref 70–99)
Potassium: 3.9 mmol/L (ref 3.5–5.1)
Sodium: 142 mmol/L (ref 135–145)

## 2022-06-29 ENCOUNTER — Encounter: Payer: Self-pay | Admitting: Cardiology

## 2022-09-07 ENCOUNTER — Ambulatory Visit: Payer: PPO

## 2022-09-07 ENCOUNTER — Ambulatory Visit (INDEPENDENT_AMBULATORY_CARE_PROVIDER_SITE_OTHER): Payer: PPO

## 2022-09-07 DIAGNOSIS — Z23 Encounter for immunization: Secondary | ICD-10-CM

## 2022-09-10 ENCOUNTER — Other Ambulatory Visit: Payer: Self-pay

## 2022-09-10 MED ORDER — LOSARTAN POTASSIUM 25 MG PO TABS: 25.0000 mg | ORAL_TABLET | Freq: Every day | ORAL | 3 refills | Status: DC

## 2022-09-23 ENCOUNTER — Encounter: Payer: Self-pay | Admitting: Cardiology

## 2022-09-23 ENCOUNTER — Ambulatory Visit: Payer: PPO | Attending: Cardiology | Admitting: Cardiology

## 2022-09-23 VITALS — BP 144/84 | HR 67 | Ht 62.0 in | Wt 171.0 lb

## 2022-09-23 DIAGNOSIS — I2510 Atherosclerotic heart disease of native coronary artery without angina pectoris: Secondary | ICD-10-CM | POA: Diagnosis not present

## 2022-09-23 DIAGNOSIS — I428 Other cardiomyopathies: Secondary | ICD-10-CM | POA: Diagnosis not present

## 2022-09-23 DIAGNOSIS — I10 Essential (primary) hypertension: Secondary | ICD-10-CM

## 2022-09-23 MED ORDER — LOSARTAN POTASSIUM 50 MG PO TABS: 50.0000 mg | ORAL_TABLET | Freq: Every day | ORAL | 3 refills | Status: DC

## 2022-09-23 NOTE — Progress Notes (Signed)
Cardiology Office Note:    Date:  09/23/2022   ID:  [place], [place] 1946/06/24, MRN [id number]  PCP:  Burnard Hawthorne, FNP  Cardiologist:  Kate Sable, MD  Electrophysiologist:  None   Referring MD: Burnard Hawthorne, FNP   Chief Complaint  Patient presents with   Follow-up    3 month f/u, no new Cardiac concerns    History of Present Illness:    Valerie Curry is a 76 y.o. female with a hx of hypertension, NICM EF 30 to 35%, nonobstructive CAD (LHC 02/2022-20% RCA and LAD) , former smoker x40+ years who presents follow-up.   Started on losartan after last visit as patient did not tolerate Entresto due to itching.  States feeling well on current medications including Toprol-XL 25 mg daily, losartan 25 mg daily.  Denies chest pain or shortness of breath.  Denies edema.   Prior notes Left heart cath 02/2022 20% RCA and LAD stenosis Echo 02/2022 EF 30 to 35% Did not tolerate Entresto due to itching CT chest lung cancer screening 11/2018 showed calcified plaque in LAD and RCA. 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   Past Medical History:  Diagnosis Date   Arthritis    Car sickness    Complication of anesthesia    hard to wake up   Coronary artery disease 11/2018   calcification in LAD and RCA in screening lung scan    Family history of malignant neoplasm of breast    Maternal/paternal aunts and cousin   Hypertension    Left bundle branch block    Lump or mass in breast 2011/2012   benign   Obesity, unspecified    Osteopenia    took Boniva 1 1/2 years in past-doesn't want to restart it   Personal history of tobacco use, presenting hazards to health    PONV (postoperative nausea and vomiting)    Positive PPD    as a child. xray was normal.   Systemic sclerosis [facility])     Past Surgical History:  Procedure Laterality Date   APPENDECTOMY  1961   BREAST BIOPSY Right 09/2010   benign   BREAST BIOPSY Left 09/2011   dilated ducts-benign    BUNIONECTOMY Bilateral 2010   x 2   CHOLECYSTECTOMY N/A 10/03/2019   Procedure: LAPAROSCOPIC CHOLECYSTECTOMY;  Surgeon: Fredirick Maudlin, MD;  Location: [facility] ORS;  Service: General;  Laterality: N/A;   COLONOSCOPY  06/2004   Dr. Vira Agar   COLONOSCOPY WITH PROPOFOL N/A 01/10/2019   Procedure: COLONOSCOPY WITH PROPOFOL;  Surgeon: Virgel Manifold, MD;  Location: [facility] END[facility];  Service: Endoscopy;  Laterality: N/A;   DECOMPRESSIVE LUMBAR LAMINECTOMY LEVEL 2 N/A 04/30/2015   Procedure: CENTRAL DECOMPRESSIVE LUMBER LAMINECTOMY WITH POSSIBLE FORAMINOTOMIES L3-L4,L4-L5;  Surgeon: Latanya Maudlin, MD;  Location: [facility];  Service: Orthopedics;  Laterality: N/A;   ERCP W/ SPHICTEROTOMY     ESOPHAGOGASTRODUODENOSCOPY (EGD) WITH PROPOFOL N/A 04/07/2021   Procedure: ESOPHAGOGASTRODUODENOSCOPY (EGD) WITH PROPOFOL;  Surgeon: Virgel Manifold, MD;  Location: [place];  Service: Endoscopy;  Laterality: N/A;   LEFT HEART CATH AND CORONARY ANGIOGRAPHY Left 03/17/2022   Procedure: LEFT HEART CATH AND CORONARY ANGIOGRAPHY;  Surgeon: Nelva Bush, MD;  Location: [place] CV LAB;  Service: Cardiovascular;  Laterality: Left;   POLYPECTOMY  04/07/2021   Procedure: POLYPECTOMY;  Surgeon: Virgel Manifold, MD;  Location: [place];  Service: Endoscopy;;   [place]  Current Medications: Current Meds  Medication Sig   acetaminophen (TYLENOL) 500 MG tablet Take 500-1,000 mg by mouth every 6 (six) hours as needed for moderate pain.   MELATONIN PO Take 12 mg by mouth at bedtime as needed (sleep).   meloxicam (MOBIC) 15 MG tablet Take 15 mg by mouth daily as needed for pain.   metoprolol succinate (TOPROL XL) 25 MG 24 hr tablet Take 1 tablet (25 mg total) by mouth daily.   Multiple Vitamins-Minerals (PRESERVISION AREDS 2 PO) Take 1 tablet by mouth 2 (two) times daily.   psyllium (METAMUCIL) 58.6 % packet Take 1 packet by mouth daily at 6 (six)  AM.   rosuvastatin (CRESTOR) 20 MG tablet Take 1 tablet (20 mg total) by mouth every evening.   [DISCONTINUED] losartan (COZAAR) 25 MG tablet Take 1 tablet (25 mg total) by mouth daily.     Allergies:   Patient has no known allergies.   Social History   Socioeconomic History   Marital status: Married    Spouse name: Not on file   Number of children: 2   Years of education: 15   Highest education level: Not on file  Occupational History   Occupation: Facilities manager: [employer]  Tobacco Use   Smoking status: Former    Packs/day: 1.00    Years: 50.00    Total pack years: 50.00    Types: Cigarettes    Quit date: 11/29/2014    Years since quitting: 7.8   Smokeless tobacco: Former    Quit date: 04/30/2015  Vaping Use   Vaping Use: Never used  Substance and Sexual Activity   Alcohol use: Yes    Comment: occasional glass of wine   Drug use: No   Sexual activity: Yes    Birth control/protection: Post-menopausal  Other Topics Concern   Not on file  Social History Narrative   Pt is 76 yo female. Pt was born in [place], moved to [place] when she was 52.  Pt is married to husband of 22 years.  Pt has 2 sons from a previous marriage and 4 step sons and 49 Grandchildren.        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        Pt works out at gym 3/week (2 days a week in a class, 1 day a week with an Dietitian).  Pt enjoys reading, spending time with her family. Pt and her husband are members of [employer] and active in their church.    Social Determinants of Health   Financial Resource Strain: Not on file  Food Insecurity: Not on file  Transportation Needs: Not on file  Physical Activity: Not on file  Stress: Not on file  Social Connections: Not on file     Family History: The patient's family history includes Breast cancer in her cousin; Breast cancer (age of onset: 35) in her maternal aunt and  paternal aunt; Cancer in her maternal aunt; Heart disease in her mother; Heart disease (age of onset: 23) in her father; Hypertension in her mother; Lung cancer in her father; Other in her brother and another family member; Stroke in her mother.  ROS:   Please see the history of present illness.     All other systems reviewed and are negative.  EKGs/Labs/Other Studies Reviewed:    The following studies were reviewed today:   EKG:  EKG not ordered today.  Recent Labs: 12/31/2021: TSH 1.36 03/12/2022: Hemoglobin 13.5; Platelets 196 06/25/2022: ALT 17; BUN 19; Creatinine, Ser 0.70; Potassium 3.9; Sodium 142  Recent Lipid Panel    Component Value Date/Time   CHOL 149 10/01/2021 0823   TRIG 56 10/01/2021 0823   HDL 67 10/01/2021 0823   CHOLHDL 2.2 10/01/2021 0823   CHOLHDL 2 06/29/2021 0918   VLDL 15.4 06/29/2021 0918   LDLCALC 70 10/01/2021 0823   LDLDIRECT 100.2 07/30/2008 1017    Physical Exam:    VS:  BP (!) 144/84 (BP Location: Left Arm, Patient Position: Sitting, Cuff Size: Normal)   Pulse 67   Ht '5\' 2"'$  (1.575 m)   Wt 171 lb (77.6 kg)   SpO2 98%   BMI 31.28 kg/m     Wt Readings from Last 3 Encounters:  09/23/22 171 lb (77.6 kg)  06/17/22 170 lb 8 oz (77.3 kg)  04/22/22 173 lb (78.5 kg)     GEN:  Well nourished, well developed in no acute distress HEENT: Normal NECK: No JVD; No carotid bruits CARDIAC: RRR, no murmurs, rubs, gallops RESPIRATORY:  Clear to auscultation without rales, wheezing or rhonchi  ABDOMEN: Soft, non-tender, non-distended MUSCULOSKELETAL:  No edema; No deformity  SKIN: Warm and dry NEUROLOGIC:  Alert and oriented x 3 PSYCHIATRIC:  Normal affect   ASSESSMENT:    1. Coronary artery disease involving native coronary artery of native heart without angina pectoris   2. NICM (nonischemic cardiomyopathy) ([place])   3. Primary hypertension    PLAN:    In order of problems listed above:  Nonobstructive CAD, LHC 02/2022- 20% proximal LAD and mid  RCA ).  Continue aspirin, Crestor.  LDL at goal.   Nonischemic cardiomyopathy, EF 30 to 35%.  Increase losartan to 50 mg daily.  Continue Toprol-XL 25 mg daily.  Patient is euvolemic.  Plan to add Aldactone at follow-up visit if BP permits.  Hypertension, BP controlled.  Toprol-XL, losartan as above.  Follow-up  in 4 weeks for medication titration.   Medication Adjustments/Labs and Tests Ordered: Current medicines are reviewed at length with the patient today.  Concerns regarding medicines are outlined above.  Orders Placed This Encounter  Procedures   EKG 12-Lead   Meds ordered this encounter  Medications   losartan (COZAAR) 50 MG tablet    Sig: Take 1 tablet (50 mg total) by mouth daily.    Dispense:  30 tablet    Refill:  3    Patient Instructions  Medication Instructions:   Your physician has recommended you make the following change in your medication:   INCREASE your Losartan to 50 MG once a day.   *If you need a refill on your cardiac medications before your next appointment, please call your pharmacy*    Follow-Up: At [facility], you and your health needs are our priority.  As part of our continuing mission to provide you with exceptional heart care, we have created designated Provider Care Teams.  These Care Teams include your primary Cardiologist (physician) and Advanced Practice Providers (APPs -  Physician Assistants and Nurse Practitioners) who all work together to provide you with the care you need, when you need it.  We recommend signing up for the patient portal called "MyChart".  Sign up information is provided on this After Visit Summary.  MyChart is used to connect with patients for Virtual Visits (Telemedicine).  Patients are able to view lab/test results, encounter notes, upcoming appointments, etc.  Non-urgent messages can be sent  to your provider as well.   To learn more about what you can do with MyChart, go to [email or web address].    Your  next appointment:   4 week(s)  The format for your next appointment:   In Person  Provider:   Kate Sable, MD    Other Instructions   Important Information About Sugar         Signed, Kate Sable, MD  09/23/2022 12:45 PM    [place]

## 2022-09-23 NOTE — Patient Instructions (Signed)
Medication Instructions:   Your physician has recommended you make the following change in your medication:   INCREASE your Losartan to 50 MG once a day.   *If you need a refill on your cardiac medications before your next appointment, please call your pharmacy*    Follow-Up: At [facility], you and your health needs are our priority.  As part of our continuing mission to provide you with exceptional heart care, we have created designated Provider Care Teams.  These Care Teams include your primary Cardiologist (physician) and Advanced Practice Providers (APPs -  Physician Assistants and Nurse Practitioners) who all work together to provide you with the care you need, when you need it.  We recommend signing up for the patient portal called "MyChart".  Sign up information is provided on this After Visit Summary.  MyChart is used to connect with patients for Virtual Visits (Telemedicine).  Patients are able to view lab/test results, encounter notes, upcoming appointments, etc.  Non-urgent messages can be sent to your provider as well.   To learn more about what you can do with MyChart, go to [email or web address].    Your next appointment:   4 week(s)  The format for your next appointment:   In Person  Provider:   Kate Sable, MD    Other Instructions   Important Information About Sugar

## 2022-10-01 DIAGNOSIS — M7062 Trochanteric bursitis, left hip: Secondary | ICD-10-CM | POA: Diagnosis not present

## 2022-10-01 DIAGNOSIS — M5416 Radiculopathy, lumbar region: Secondary | ICD-10-CM | POA: Diagnosis not present

## 2022-10-05 ENCOUNTER — Other Ambulatory Visit: Payer: Self-pay | Admitting: Family

## 2022-10-05 DIAGNOSIS — I2510 Atherosclerotic heart disease of native coronary artery without angina pectoris: Secondary | ICD-10-CM

## 2022-10-28 ENCOUNTER — Encounter: Payer: Self-pay | Admitting: Cardiology

## 2022-10-28 ENCOUNTER — Ambulatory Visit: Payer: PPO | Attending: Cardiology | Admitting: Cardiology

## 2022-10-28 VITALS — BP 144/88 | HR 60 | Ht 62.0 in | Wt 172.0 lb

## 2022-10-28 DIAGNOSIS — I10 Essential (primary) hypertension: Secondary | ICD-10-CM

## 2022-10-28 DIAGNOSIS — I2510 Atherosclerotic heart disease of native coronary artery without angina pectoris: Secondary | ICD-10-CM

## 2022-10-28 DIAGNOSIS — M5416 Radiculopathy, lumbar region: Secondary | ICD-10-CM | POA: Diagnosis not present

## 2022-10-28 DIAGNOSIS — I428 Other cardiomyopathies: Secondary | ICD-10-CM

## 2022-10-28 MED ORDER — SPIRONOLACTONE 25 MG PO TABS: 12.5000 mg | ORAL_TABLET | Freq: Every day | ORAL | 0 refills | Status: DC

## 2022-10-28 NOTE — Progress Notes (Signed)
Cardiology Office Note:    Date:  10/28/2022   ID:  [place], [place] [birth year], MRN [id number]  PCP:  Burnard Hawthorne, FNP  Cardiologist:  Kate Sable, MD  Electrophysiologist:  None   Referring MD: Burnard Hawthorne, FNP   Chief Complaint  Patient presents with   Follow-up    4 week follow up, no new cardiac concerns     History of Present Illness:    Valerie Curry is a 76 y.o. female with a hx of hypertension, NICM EF 30 to 35%, nonobstructive CAD (LHC 02/2022-20% RCA and LAD) , former smoker x40+ years who presents follow-up.   Being seen for nonischemic cardiomyopathy, losartan previously increased to 50 mg daily.  Patient tolerating medications, denies chest pain or shortness of breath, denies edema.  Feels well, has no concerns at this time.   Prior notes Left heart cath 02/2022 20% RCA and LAD stenosis Echo 02/2022 EF 30 to 35% Did not tolerate Entresto due to itching CT chest lung cancer screening 11/2018 showed calcified plaque in LAD and RCA. 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   Past Medical History:  Diagnosis Date   Arthritis    Car sickness    Complication of anesthesia    hard to wake up   Coronary artery disease 11/2018   calcification in LAD and RCA in screening lung scan    Family history of malignant neoplasm of breast    Maternal/paternal aunts and cousin   Hypertension    Left bundle branch block    Lump or mass in breast 2011/2012   benign   Obesity, unspecified    Osteopenia    took Boniva 1 1/2 years in past-doesn't want to restart it   Personal history of tobacco use, presenting hazards to health    PONV (postoperative nausea and vomiting)    Positive PPD    as a child. xray was normal.   Systemic sclerosis [facility])     Past Surgical History:  Procedure Laterality Date   APPENDECTOMY  1961   BREAST BIOPSY Right 09/2010   benign   BREAST BIOPSY Left 09/2011   dilated ducts-benign   BUNIONECTOMY  Bilateral 2010   x 2   CHOLECYSTECTOMY N/A 10/03/2019   Procedure: LAPAROSCOPIC CHOLECYSTECTOMY;  Surgeon: Fredirick Maudlin, MD;  Location: [facility] ORS;  Service: General;  Laterality: N/A;   COLONOSCOPY  06/2004   Dr. Vira Agar   COLONOSCOPY WITH PROPOFOL N/A 01/10/2019   Procedure: COLONOSCOPY WITH PROPOFOL;  Surgeon: Virgel Manifold, MD;  Location: [facility];  Service: Endoscopy;  Laterality: N/A;   DECOMPRESSIVE LUMBAR LAMINECTOMY LEVEL 2 N/A 04/30/2015   Procedure: CENTRAL DECOMPRESSIVE LUMBER LAMINECTOMY WITH POSSIBLE FORAMINOTOMIES L3-L4,L4-L5;  Surgeon: Latanya Maudlin, MD;  [facility];  Service: Orthopedics;  Laterality: N/A;   ERCP W/ SPHICTEROTOMY     ESOPHAGOGASTRODUODENOSCOPY (EGD) WITH PROPOFOL N/A 04/07/2021   Procedure: ESOPHAGOGASTRODUODENOSCOPY (EGD) WITH PROPOFOL;  Surgeon: Virgel Manifold, MD;  Location: [place];  Service: Endoscopy;  Laterality: N/A;   LEFT HEART CATH AND CORONARY ANGIOGRAPHY Left 03/17/2022   Procedure: LEFT HEART CATH AND CORONARY ANGIOGRAPHY;  Surgeon: Nelva Bush, MD;  Location: [place] CV LAB;  Service: Cardiovascular;  Laterality: Left;   POLYPECTOMY  04/07/2021   Procedure: POLYPECTOMY;  Surgeon: Virgel Manifold, MD;  Location: [place];  Service: Endoscopy;;   TONSILLECTOMY  1968   TUBAL LIGATION  1974    Current Medications: Current Meds  Medication  Sig   acetaminophen (TYLENOL) 500 MG tablet Take 500-1,000 mg by mouth every 6 (six) hours as needed for moderate pain.   gabapentin (NEURONTIN) 100 MG capsule Take 1 capsule by mouth 3 (three) times daily.   losartan (COZAAR) 50 MG tablet Take 1 tablet (50 mg total) by mouth daily.   MELATONIN PO Take 12 mg by mouth at bedtime as needed (sleep).   meloxicam (MOBIC) 15 MG tablet Take 15 mg by mouth daily as needed for pain.   metoprolol succinate (TOPROL XL) 25 MG 24 hr tablet Take 1 tablet (25 mg total) by mouth daily.   Multiple  Vitamins-Minerals (PRESERVISION AREDS 2 PO) Take 1 tablet by mouth 2 (two) times daily.   psyllium (METAMUCIL) 58.6 % packet Take 1 packet by mouth daily at 6 (six) AM.   rosuvastatin (CRESTOR) 20 MG tablet TAKE 1 TABLET(20 MG) BY MOUTH EVERY EVENING   spironolactone (ALDACTONE) 25 MG tablet Take 0.5 tablets (12.5 mg total) by mouth daily.     Allergies:   Patient has no known allergies.   Social History   Socioeconomic History   Marital status: Married    Spouse name: Not on file   Number of children: 2   Years of education: 15   Highest education level: Not on file  Occupational History   Occupation: Facilities manager: [employer]  Tobacco Use   Smoking status: Former    Packs/day: 1.00    Years: 50.00    Total pack years: 50.00    Types: Cigarettes    Quit date: 11/29/2014    Years since quitting: 7.9   Smokeless tobacco: Former    Quit date: 04/30/2015  Vaping Use   Vaping Use: Never used  Substance and Sexual Activity   Alcohol use: Yes    Comment: occasional glass of wine   Drug use: No   Sexual activity: Yes    Birth control/protection: Post-menopausal  Other Topics Concern   Not on file  Social History Narrative   Pt is 76 yo female. Pt was born in [place], moved to [place] when she was 15.  Pt is married to husband of 22 years.  Pt has 2 sons from a previous marriage and 4 step sons and 70 Grandchildren.        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        Pt works out at gym 3/week (2 days a week in a class, 1 day a week with an Dietitian).  Pt enjoys reading, spending time with her family. Pt and her husband are members of [employer] and active in their church.    Social Determinants of Health   Financial Resource Strain: Not on file  Food Insecurity: Not on file  Transportation Needs: Not on file  Physical Activity: Not on file  Stress: Not on file  Social Connections: Not on  file     Family History: The patient's family history includes Breast cancer in her cousin; Breast cancer (age of onset: 56) in her maternal aunt and paternal aunt; Cancer in her maternal aunt; Heart disease in her mother; Heart disease (age of onset: 42) in her father; Hypertension in her mother; Lung cancer in her father; Other in her brother and another family member; Stroke in her mother.  ROS:   Please see the history of present illness.     All other systems reviewed and are negative.  EKGs/Labs/Other Studies Reviewed:    The following studies were reviewed today:   EKG:  EKG not ordered today.    Recent Labs: 12/31/2021: TSH 1.36 03/12/2022: Hemoglobin 13.5; Platelets 196 06/25/2022: ALT 17; BUN 19; Creatinine, Ser 0.70; Potassium 3.9; Sodium 142  Recent Lipid Panel    Component Value Date/Time   CHOL 149 10/01/2021 0823   TRIG 56 10/01/2021 0823   HDL 67 10/01/2021 0823   CHOLHDL 2.2 10/01/2021 0823   CHOLHDL 2 06/29/2021 0918   VLDL 15.4 06/29/2021 0918   LDLCALC 70 10/01/2021 0823   LDLDIRECT 100.2 07/30/2008 1017    Physical Exam:    VS:  BP (!) 144/88 (BP Location: Left Arm, Patient Position: Sitting, Cuff Size: Normal)   Pulse 60   Ht '5\' 2"'$  (1.575 m)   Wt 172 lb (78 kg)   SpO2 98%   BMI 31.46 kg/m     Wt Readings from Last 3 Encounters:  10/28/22 172 lb (78 kg)  09/23/22 171 lb (77.6 kg)  06/17/22 170 lb 8 oz (77.3 kg)     GEN:  Well nourished, well developed in no acute distress HEENT: Normal NECK: No JVD; No carotid bruits CARDIAC: RRR, no murmurs, rubs, gallops RESPIRATORY:  Clear to auscultation without rales, wheezing or rhonchi  ABDOMEN: Soft, non-tender, non-distended MUSCULOSKELETAL:  No edema; No deformity  SKIN: Warm and dry NEUROLOGIC:  Alert and oriented x 3 PSYCHIATRIC:  Normal affect   ASSESSMENT:    1. Coronary artery disease involving native coronary artery of native heart without angina pectoris   2. NICM (nonischemic  cardiomyopathy) ([place])   3. Primary hypertension     PLAN:    In order of problems listed above:  Nonobstructive CAD, LHC 02/2022- 20% proximal LAD and mid RCA ).  Continue aspirin, Crestor.  LDL at goal.   Nonischemic cardiomyopathy, EF 30 to 35%.  Heart rate 60.  Appears euvolemic.  NYHA class II symptoms.  Start Aldactone 12.5 mg daily, continue losartan 50 mg daily, Toprol-XL 25 mg daily. Hypertension, BP slightly elevated,   Toprol-XL, losartan, Aldactone possible.  Follow-up  in 4-6 weeks for medication titration.    Medication Adjustments/Labs and Tests Ordered: Current medicines are reviewed at length with the patient today.  Concerns regarding medicines are outlined above.  No orders of the defined types were placed in this encounter.  Meds ordered this encounter  Medications   spironolactone (ALDACTONE) 25 MG tablet    Sig: Take 0.5 tablets (12.5 mg total) by mouth daily.    Dispense:  45 tablet    Refill:  0    Patient Instructions  Medication Instructions:   START Spironolactone - take half tablet (12.'5mg'$ ) by mouth daily.   *If you need a refill on your cardiac medications before your next appointment, please call your pharmacy*    Follow-Up: At [facility], you and your health needs are our priority.  As part of our continuing mission to provide you with exceptional heart care, we have created designated Provider Care Teams.  These Care Teams include your primary Cardiologist (physician) and Advanced Practice Providers (APPs -  Physician Assistants and Nurse Practitioners) who all work together to provide you with the care you need, when you need it.  We recommend signing up for the patient portal called "MyChart".  Sign up information is provided on this After Visit Summary.  MyChart is used to connect with patients for Virtual Visits (Telemedicine).  Patients are able to view lab/test  results, encounter notes, upcoming appointments, etc.  Non-urgent  messages can be sent to your provider as well.   To learn more about what you can do with MyChart, go to [email or web address].    Your next appointment:   4 -6 week(s)  The format for your next appointment:   In Person  Provider:   Kate Sable, MD ONLY        Signed, Kate Sable, MD  10/28/2022 12:05 PM    [place]

## 2022-10-28 NOTE — Patient Instructions (Signed)
Medication Instructions:   START Spironolactone - take half tablet (12.'5mg'$ ) by mouth daily.   *If you need a refill on your cardiac medications before your next appointment, please call your pharmacy*    Follow-Up: At [facility], you and your health needs are our priority.  As part of our continuing mission to provide you with exceptional heart care, we have created designated Provider Care Teams.  These Care Teams include your primary Cardiologist (physician) and Advanced Practice Providers (APPs -  Physician Assistants and Nurse Practitioners) who all work together to provide you with the care you need, when you need it.  We recommend signing up for the patient portal called "MyChart".  Sign up information is provided on this After Visit Summary.  MyChart is used to connect with patients for Virtual Visits (Telemedicine).  Patients are able to view lab/test results, encounter notes, upcoming appointments, etc.  Non-urgent messages can be sent to your provider as well.   To learn more about what you can do with MyChart, go to [email or web address].    Your next appointment:   4 -6 week(s)  The format for your next appointment:   In Person  Provider:   Kate Sable, MD ONLY

## 2022-10-29 DIAGNOSIS — M5416 Radiculopathy, lumbar region: Secondary | ICD-10-CM | POA: Diagnosis not present

## 2022-11-09 DIAGNOSIS — M5416 Radiculopathy, lumbar region: Secondary | ICD-10-CM | POA: Diagnosis not present

## 2022-11-12 DIAGNOSIS — M5416 Radiculopathy, lumbar region: Secondary | ICD-10-CM | POA: Diagnosis not present

## 2022-11-17 DIAGNOSIS — M5416 Radiculopathy, lumbar region: Secondary | ICD-10-CM | POA: Diagnosis not present

## 2022-11-26 DIAGNOSIS — H353131 Nonexudative age-related macular degeneration, bilateral, early dry stage: Secondary | ICD-10-CM | POA: Diagnosis not present

## 2022-11-29 DIAGNOSIS — Z9841 Cataract extraction status, right eye: Secondary | ICD-10-CM

## 2022-11-29 HISTORY — DX: Cataract extraction status, left eye: Z98.41

## 2022-12-03 DIAGNOSIS — M5416 Radiculopathy, lumbar region: Secondary | ICD-10-CM | POA: Diagnosis not present

## 2022-12-14 ENCOUNTER — Ambulatory Visit: Payer: PPO | Admitting: Cardiology

## 2022-12-29 ENCOUNTER — Encounter: Payer: Self-pay | Admitting: Cardiology

## 2022-12-29 ENCOUNTER — Ambulatory Visit: Payer: PPO | Attending: Cardiology | Admitting: Cardiology

## 2022-12-29 VITALS — BP 140/84 | HR 81 | Ht 62.0 in | Wt 167.4 lb

## 2022-12-29 DIAGNOSIS — I2510 Atherosclerotic heart disease of native coronary artery without angina pectoris: Secondary | ICD-10-CM

## 2022-12-29 DIAGNOSIS — I10 Essential (primary) hypertension: Secondary | ICD-10-CM | POA: Diagnosis not present

## 2022-12-29 DIAGNOSIS — I428 Other cardiomyopathies: Secondary | ICD-10-CM | POA: Diagnosis not present

## 2022-12-29 MED ORDER — SPIRONOLACTONE 25 MG PO TABS: 25.0000 mg | ORAL_TABLET | Freq: Every day | ORAL | 0 refills | Status: DC

## 2022-12-29 NOTE — Patient Instructions (Signed)
Medication Instructions:   INCREASE Spirolactone - take 1 tablet (25 mg) by mouth daily.   *If you need a refill on your cardiac medications before your next appointment, please call your pharmacy*   Lab Work:  None Ordered  If you have labs (blood work) drawn today and your tests are completely normal, you will receive your results only by: [employer] (if you have MyChart) OR A paper copy in the mail If you have any lab test that is abnormal or we need to change your treatment, we will call you to review the results.   Testing/Procedures:  Your physician has requested that you have an echocardiogram in 3 months . Echocardiography is a painless test that uses sound waves to create images of your heart. It provides your doctor with information about the size and shape of your heart and how well your heart's chambers and valves are working. This procedure takes approximately one hour. There are no restrictions for this procedure. Please do NOT wear cologne, perfume, aftershave, or lotions (deodorant is allowed). Please arrive 15 minutes prior to your appointment time.    Follow-Up: At [facility], you and your health needs are our priority.  As part of our continuing mission to provide you with exceptional heart care, we have created designated Provider Care Teams.  These Care Teams include your primary Cardiologist (physician) and Advanced Practice Providers (APPs -  Physician Assistants and Nurse Practitioners) who all work together to provide you with the care you need, when you need it.  We recommend signing up for the patient portal called "MyChart".  Sign up information is provided on this After Visit Summary.  MyChart is used to connect with patients for Virtual Visits (Telemedicine).  Patients are able to view lab/test results, encounter notes, upcoming appointments, etc.  Non-urgent messages can be sent to your provider as well.   To learn more about what you can do  with MyChart, go to [email or web address].    Your next appointment:    After Echocardiogram  Provider:   You may see Kate Sable, MD or one of the following Advanced Practice Providers on your designated Care Team:   Murray Hodgkins, NP Christell Faith, PA-C Cadence Kathlen Mody, PA-C Gerrie Nordmann, NP

## 2022-12-29 NOTE — Progress Notes (Signed)
Cardiology Office Note:    Date:  12/29/2022   ID:  A[place], [place] September 09, 1946, MRN [id number]  PCP:  Burnard Hawthorne, FNP  Cardiologist:  Kate Sable, MD  Electrophysiologist:  None   Referring MD: Burnard Hawthorne, FNP   Chief Complaint  Patient presents with   4-6 week follow up     "Doing well." Medications reviewed by the patient verbally.     History of Present Illness:    Valerie Curry is a 77 y.o. female with a hx of hypertension, NICM EF 30 to 35%, nonobstructive CAD (LHC 02/2022-20% RCA and LAD) , former smoker x40+ years who presents follow-up.   She feels well, Aldactone started after last visit, tolerating medications without any adverse effects.  Previously did not tolerate Entresto, this was stopped.  Started on losartan, has no effects with taking losartan.  Denies edema, chest pain, shortness of breath.   Prior notes Left heart cath 02/2022 20% RCA and LAD stenosis Echo 02/2022 EF 30 to 35% Did not tolerate Entresto due to itching CT chest lung cancer screening 11/2018 showed calcified plaque in LAD and RCA. 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   Past Medical History:  Diagnosis Date   Arthritis    Car sickness    Complication of anesthesia    hard to wake up   Coronary artery disease 11/2018   calcification in LAD and RCA in screening lung scan    Family history of malignant neoplasm of breast    Maternal/paternal aunts and cousin   Hypertension    Left bundle branch block    Lump or mass in breast 2011/2012   benign   Obesity, unspecified    Osteopenia    took Boniva 1 1/2 years in past-doesn't want to restart it   Personal history of tobacco use, presenting hazards to health    PONV (postoperative nausea and vomiting)    Positive PPD    as a child. xray was normal.   Systemic sclerosis [facility])     Past Surgical History:  Procedure Laterality Date   APPENDECTOMY  1961   BREAST BIOPSY Right 09/2010   benign    BREAST BIOPSY Left 09/2011   dilated ducts-benign   BUNIONECTOMY Bilateral 2010   x 2   CHOLECYSTECTOMY N/A 10/03/2019   Procedure: LAPAROSCOPIC CHOLECYSTECTOMY;  Surgeon: Fredirick Maudlin, MD;  Location: [facility] ORS;  Service: General;  Laterality: N/A;   COLONOSCOPY  06/2004   Dr. Vira Agar   COLONOSCOPY WITH PROPOFOL N/A 01/10/2019   Procedure: COLONOSCOPY WITH PROPOFOL;  Surgeon: Virgel Manifold, MD;  Location: [facility] ENDOSCOPY;  Service: Endoscopy;  Laterality: N/A;   DECOMPRESSIVE LUMBAR LAMINECTOMY LEVEL 2 N/A 04/30/2015   Procedure: CENTRAL DECOMPRESSIVE LUMBER LAMINECTOMY WITH POSSIBLE FORAMINOTOMIES L3-L4,L4-L5;  Surgeon: Latanya Maudlin, MD;  Location: [facility];  Service: Orthopedics;  Laterality: N/A;   ERCP W/ SPHICTEROTOMY     ESOPHAGOGASTRODUODENOSCOPY (EGD) WITH PROPOFOL N/A 04/07/2021   Procedure: ESOPHAGOGASTRODUODENOSCOPY (EGD) WITH PROPOFOL;  Surgeon: Virgel Manifold, MD;  Location: [place];  Service: Endoscopy;  Laterality: N/A;   LEFT HEART CATH AND CORONARY ANGIOGRAPHY Left 03/17/2022   Procedure: LEFT HEART CATH AND CORONARY ANGIOGRAPHY;  Surgeon: Nelva Bush, MD;  Location: [place] CV LAB;  Service: Cardiovascular;  Laterality: Left;   POLYPECTOMY  04/07/2021   Procedure: POLYPECTOMY;  Surgeon: Virgel Manifold, MD;  Location: [place];  Service: Endoscopy;;   [place]  1974    Current Medications: Current Meds  Medication Sig   acetaminophen (TYLENOL) 500 MG tablet Take 500-1,000 mg by mouth every 6 (six) hours as needed for moderate pain.   losartan (COZAAR) 50 MG tablet Take 1 tablet (50 mg total) by mouth daily.   MELATONIN PO Take 12 mg by mouth at bedtime as needed (sleep).   meloxicam (MOBIC) 15 MG tablet Take 15 mg by mouth daily as needed for pain.   metoprolol succinate (TOPROL XL) 25 MG 24 hr tablet Take 1 tablet (25 mg total) by mouth daily.   Multiple Vitamins-Minerals (PRESERVISION  AREDS 2 PO) Take 1 tablet by mouth 2 (two) times daily.   psyllium (METAMUCIL) 58.6 % packet Take 1 packet by mouth daily at 6 (six) AM.   rosuvastatin (CRESTOR) 20 MG tablet TAKE 1 TABLET(20 MG) BY MOUTH EVERY EVENING   [DISCONTINUED] spironolactone (ALDACTONE) 25 MG tablet Take 0.5 tablets (12.5 mg total) by mouth daily.     Allergies:   Patient has no known allergies.   Social History   Socioeconomic History   Marital status: Married    Spouse name: Not on file   Number of children: 2   Years of education: 15   Highest education level: Not on file  Occupational History   Occupation: Facilities manager: [employer]  Tobacco Use   Smoking status: Former    Packs/day: 1.00    Years: 50.00    Total pack years: 50.00    Types: Cigarettes    Quit date: 11/29/2014    Years since quitting: 8.0   Smokeless tobacco: Former    Quit date: 04/30/2015  Vaping Use   Vaping Use: Never used  Substance and Sexual Activity   Alcohol use: Yes    Comment: occasional glass of wine   Drug use: No   Sexual activity: Yes    Birth control/protection: Post-menopausal  Other Topics Concern   Not on file  Social History Narrative   Pt is 77 yo female. Pt was born in [place], moved to [place] when she was 74.  Pt is married to husband of 22 years.  Pt has 2 sons from a previous marriage and 4 step sons and 57 Grandchildren.        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        Pt works out at gym 3/week (2 days a week in a class, 1 day a week with an Dietitian).  Pt enjoys reading, spending time with her family. Pt and her husband are members of [employer] and active in their church.    Social Determinants of Health   Financial Resource Strain: Not on file  Food Insecurity: Not on file  Transportation Needs: Not on file  Physical Activity: Not on file  Stress: Not on file  Social Connections: Not on file      Family History: The patient's family history includes Breast cancer in her cousin; Breast cancer (age of onset: 81) in her maternal aunt and paternal aunt; Cancer in her maternal aunt; Heart disease in her mother; Heart disease (age of onset: 38) in her father; Hypertension in her mother; Lung cancer in her father; Other in her brother and another family member; Stroke in her mother.  ROS:   Please see the history of present illness.     All other systems reviewed and are negative.  EKGs/Labs/Other Studies Reviewed:  The following studies were reviewed today:   EKG:  EKG not ordered today.    Recent Labs: 12/31/2021: TSH 1.36 03/12/2022: Hemoglobin 13.5; Platelets 196 06/25/2022: ALT 17; BUN 19; Creatinine, Ser 0.70; Potassium 3.9; Sodium 142  Recent Lipid Panel    Component Value Date/Time   CHOL 149 10/01/2021 0823   TRIG 56 10/01/2021 0823   HDL 67 10/01/2021 0823   CHOLHDL 2.2 10/01/2021 0823   CHOLHDL 2 06/29/2021 0918   VLDL 15.4 06/29/2021 0918   LDLCALC 70 10/01/2021 0823   LDLDIRECT 100.2 07/30/2008 1017    Physical Exam:    VS:  BP (!) 140/84 (BP Location: Left Arm, Patient Position: Sitting, Cuff Size: Normal)   Pulse 81   Ht '5\' 2"'$  (1.575 m)   Wt 167 lb 6 oz (75.9 kg)   SpO2 98%   BMI 30.61 kg/m     Wt Readings from Last 3 Encounters:  12/29/22 167 lb 6 oz (75.9 kg)  10/28/22 172 lb (78 kg)  09/23/22 171 lb (77.6 kg)     GEN:  Well nourished, well developed in no acute distress HEENT: Normal NECK: No JVD; No carotid bruits CARDIAC: RRR, no murmurs, rubs, gallops RESPIRATORY:  Clear to auscultation without rales, wheezing or rhonchi  ABDOMEN: Soft, non-tender, non-distended MUSCULOSKELETAL:  No edema; No deformity  SKIN: Warm and dry NEUROLOGIC:  Alert and oriented x 3 PSYCHIATRIC:  Normal affect   ASSESSMENT:    1. Coronary artery disease involving native coronary artery of native heart without angina pectoris   2. NICM (nonischemic  cardiomyopathy) ([place])   3. Primary hypertension     PLAN:    In order of problems listed above:  Nonobstructive CAD, LHC 02/2022- 20% proximal LAD and mid RCA ).  Continue aspirin, Crestor.  LDL at goal.   Nonischemic cardiomyopathy, EF 30 to 35%.  Heart rate 60.  Appears euvolemic.  NYHA class II symptoms.  Increase Aldactone to 25 mg daily,  continue losartan 50 mg daily, Toprol-XL 25 mg daily.  Check BMP in 1 week.  Repeat echocardiogram in 3 months. Hypertension, BP slightly elevated.  Increase Aldactone as above.  Continue losartan 50, Toprol-XL.  Consider increasing losartan if BP stays elevated at follow-up visit.  Follow-up in 3 months after repeat echo.   Medication Adjustments/Labs and Tests Ordered: Current medicines are reviewed at length with the patient today.  Concerns regarding medicines are outlined above.  Orders Placed This Encounter  Procedures   Basic Metabolic Panel (BMET)   ECHOCARDIOGRAM COMPLETE   Meds ordered this encounter  Medications   spironolactone (ALDACTONE) 25 MG tablet    Sig: Take 1 tablet (25 mg total) by mouth daily.    Dispense:  90 tablet    Refill:  0    Patient Instructions  Medication Instructions:   INCREASE Spirolactone - take 1 tablet (25 mg) by mouth daily.   *If you need a refill on your cardiac medications before your next appointment, please call your pharmacy*   Lab Work:  None Ordered  If you have labs (blood work) drawn today and your tests are completely normal, you will receive your results only by: [place] (if you have MyChart) OR A paper copy in the mail If you have any lab test that is abnormal or we need to change your treatment, we will call you to review the results.   Testing/Procedures:  Your physician has requested that you have an echocardiogram in 3 months . Echocardiography is  a painless test that uses sound waves to create images of your heart. It provides your doctor with information about  the size and shape of your heart and how well your heart's chambers and valves are working. This procedure takes approximately one hour. There are no restrictions for this procedure. Please do NOT wear cologne, perfume, aftershave, or lotions (deodorant is allowed). Please arrive 15 minutes prior to your appointment time.    Follow-Up: At [facility], you and your health needs are our priority.  As part of our continuing mission to provide you with exceptional heart care, we have created designated Provider Care Teams.  These Care Teams include your primary Cardiologist (physician) and Advanced Practice Providers (APPs -  Physician Assistants and Nurse Practitioners) who all work together to provide you with the care you need, when you need it.  We recommend signing up for the patient portal called "MyChart".  Sign up information is provided on this After Visit Summary.  MyChart is used to connect with patients for Virtual Visits (Telemedicine).  Patients are able to view lab/test results, encounter notes, upcoming appointments, etc.  Non-urgent messages can be sent to your provider as well.   To learn more about what you can do with MyChart, go to [email or web address].    Your next appointment:    After Echocardiogram  Provider:   You may see Kate Sable, MD or one of the following Advanced Practice Providers on your designated Care Team:   Murray Hodgkins, NP Christell Faith, PA-C Cadence Kathlen Mody, PA-C Gerrie Nordmann, NP    Signed, Kate Sable, MD  12/29/2022 12:09 PM    [place]

## 2022-12-30 ENCOUNTER — Other Ambulatory Visit: Payer: Self-pay

## 2022-12-30 DIAGNOSIS — Z122 Encounter for screening for malignant neoplasm of respiratory organs: Secondary | ICD-10-CM

## 2022-12-30 DIAGNOSIS — Z87891 Personal history of nicotine dependence: Secondary | ICD-10-CM

## 2023-01-03 ENCOUNTER — Other Ambulatory Visit: Payer: Self-pay

## 2023-01-03 MED ORDER — METOPROLOL SUCCINATE ER 25 MG PO TB24: 25.0000 mg | ORAL_TABLET | Freq: Every day | ORAL | 0 refills | Status: DC

## 2023-01-06 ENCOUNTER — Other Ambulatory Visit: Payer: Self-pay | Admitting: Surgery

## 2023-01-06 DIAGNOSIS — E042 Nontoxic multinodular goiter: Secondary | ICD-10-CM

## 2023-01-07 ENCOUNTER — Other Ambulatory Visit
Admission: RE | Admit: 2023-01-07 | Discharge: 2023-01-07 | Disposition: A | Discharge status: Home / Self Care | Payer: PPO | Attending: Cardiology | Admitting: Cardiology

## 2023-01-07 DIAGNOSIS — I2510 Atherosclerotic heart disease of native coronary artery without angina pectoris: Secondary | ICD-10-CM | POA: Diagnosis not present

## 2023-01-07 LAB — BASIC METABOLIC PANEL
Anion gap: 7 (ref 5–15)
BUN: 19 mg/dL (ref 8–23)
CO2: 27 mmol/L (ref 22–32)
Calcium: 9.1 mg/dL (ref 8.9–10.3)
Chloride: 101 mmol/L (ref 98–111)
Creatinine, Ser: 0.74 mg/dL (ref 0.44–1.00)
GFR, Estimated: >60 mL/min (ref >60)
Glucose, Bld: 95 mg/dL (ref 70–99)
Potassium: 4.1 mmol/L (ref 3.5–5.1)
Sodium: 135 mmol/L (ref 135–145)

## 2023-01-11 ENCOUNTER — Ambulatory Visit: Payer: PPO | Admitting: Family

## 2023-01-14 DIAGNOSIS — E042 Nontoxic multinodular goiter: Secondary | ICD-10-CM | POA: Diagnosis not present

## 2023-01-19 ENCOUNTER — Encounter: Payer: Self-pay | Admitting: Family

## 2023-01-19 ENCOUNTER — Ambulatory Visit (INDEPENDENT_AMBULATORY_CARE_PROVIDER_SITE_OTHER): Payer: PPO | Admitting: Family

## 2023-01-19 VITALS — BP 138/80 | HR 76 | Temp 98.0°F | Ht 62.0 in | Wt 170.2 lb

## 2023-01-19 DIAGNOSIS — M545 Low back pain, unspecified: Secondary | ICD-10-CM | POA: Diagnosis not present

## 2023-01-19 DIAGNOSIS — G8929 Other chronic pain: Secondary | ICD-10-CM

## 2023-01-19 DIAGNOSIS — I10 Essential (primary) hypertension: Secondary | ICD-10-CM | POA: Diagnosis not present

## 2023-01-19 DIAGNOSIS — Z1231 Encounter for screening mammogram for malignant neoplasm of breast: Secondary | ICD-10-CM

## 2023-01-19 MED ORDER — TRAMADOL HCL 50 MG PO TABS: 50.0000 mg | ORAL_TABLET | Freq: Two times a day (BID) | ORAL | 1 refills | Status: DC | PRN

## 2023-01-19 MED ORDER — METHOCARBAMOL 750 MG PO TABS: 750.0000 mg | ORAL_TABLET | Freq: Three times a day (TID) | ORAL | 1 refills | Status: DC | PRN

## 2023-01-19 NOTE — Patient Instructions (Addendum)
[email or web address]  Trial tramadol for moderate to severe pain days.  Please store in a safe place.  You may also try meloxicam versus Aleve to see one or the other is more helpful for you.  Please remember both of these are anti-inflammatories and you do not need to use both at the same time.  Please call  and schedule your 3D mammogram and /or bone density scan as we discussed.   [facility]  ( new location in 2023)  [address], [place], [place] [address]  [place], [place]

## 2023-01-19 NOTE — Assessment & Plan Note (Signed)
Chronic, following with Dr Kayleen Memos. Considering second ESI.  Discussed QOL and importance of exercise.   Discussed she may use meloxicam or Aleve however not at same time as same drug class.  Discussed gabapentin likely to be ineffective in that no radicular symptoms at this time.  We discussed trial of tramadol for moderate to severe pain days.  Provided her with robaxin to trial as flexeril ineffective. counseled on side effects and advised patient to start a safe place.

## 2023-01-19 NOTE — Progress Notes (Signed)
Assessment & Plan:  Chronic bilateral low back pain without sciatica Assessment & Plan: Chronic, following with Dr Kayleen Memos. Considering second ESI.  Discussed QOL and importance of exercise.   Discussed she may use meloxicam or Aleve however not at same time as same drug class.  Discussed gabapentin likely to be ineffective in that no radicular symptoms at this time.  We discussed trial of tramadol for moderate to severe pain days.  Provided her with robaxin to trial as flexeril ineffective. counseled on side effects and advised patient to start a safe place.    Orders: -     Methocarbamol; Take 1 tablet (750 mg total) by mouth 3 (three) times daily as needed for muscle spasms.  Dispense: 30 tablet; Refill: 1 -     traMADol HCl; Take 1 tablet (50 mg total) by mouth 2 (two) times daily as needed.  Dispense: 30 tablet; Refill: 1 -     Lipid panel; Future -     Hemoglobin A1c; Future  Encounter for screening mammogram for malignant neoplasm of breast -     3D Screening Mammogram, Left and Right; Future  Primary hypertension Assessment & Plan: Slightly elevated today.  Offered increasing losartan from 50 mg to 75 mg as recommended by Dr Charlestine Night.  Patient politely declines today.  She prefers to continue on Aldactone to 25 mg, losartan 50 mg daily, Toprol 25 mg daily and follow up with Dr Charlestine Night this spring.        Return precautions given.   Risks, benefits, and alternatives of the medications and treatment plan prescribed today were discussed, and patient expressed understanding.   Education regarding symptom management and diagnosis given to patient on AVS either electronically or printed.  Return in about 4 months (around 05/20/2023) for Fasting labs in 2-3 weeks.  Valerie Paris, FNP  Subjective:    Patient ID: Valerie Curry, female    DOB: 1946-11-08, 77 y.o.   MRN: [id number]  CC: Valerie Curry is a 77 y.o. female who presents today for follow up.   HPI: She complains of  low back pain, chronic.    No hip pain, numbness or weakness in legs   she is following with Dr Kayleen Memos for LBP.  She reports that she had an MRI ordered by [facility] (unable to see this image)  Improved after [facility] 10/2022.  Gabapentin 157m TID was not effective nor flexerl.   She is not using mobic.   She has some days where pain is severe and interferes with exercise.    No h/o seizure    Aleve is most helpful for pain.   She had follow-up with Dr ECharlestine Night1/31/2024 for nonobstructive CAD, nonischemic cardiomyopathy.  Continue aspirin, Crestor.  Increase Aldactone to 25 mg.  She was maintained on losartan 77 mg daily, Toprol 25 mg daily.  Pending echocardiogram in 3 months Allergies: Patient has no known allergies. Current Outpatient Medications on File Prior to Visit  Medication Sig Dispense Refill   acetaminophen (TYLENOL) 500 MG tablet Take 500-1,000 mg by mouth every 6 (six) hours as needed for moderate pain.     losartan (COZAAR) 50 MG tablet Take 1 tablet (50 mg total) by mouth daily. 30 tablet 3   meloxicam (MOBIC) 15 MG tablet Take 15 mg by mouth daily as needed for pain.     metoprolol succinate (TOPROL XL) 25 MG 24 hr tablet Take 1 tablet (25 mg total) by mouth daily. 90 tablet 0   Multiple  Vitamins-Minerals (PRESERVISION AREDS 2 PO) Take 1 tablet by mouth 2 (two) times daily.     psyllium (METAMUCIL) 58.6 % packet Take 1 packet by mouth daily at 6 (six) AM.     rosuvastatin (CRESTOR) 20 MG tablet TAKE 1 TABLET(20 MG) BY MOUTH EVERY EVENING 90 tablet 3   spironolactone (ALDACTONE) 25 MG tablet Take 1 tablet (25 mg total) by mouth daily. 90 tablet 0   gabapentin (NEURONTIN) 100 MG capsule Take 1 capsule by mouth 3 (three) times daily. (Patient not taking: Reported on 12/29/2022)     MELATONIN PO Take 12 mg by mouth at bedtime as needed (sleep). (Patient not taking: Reported on 01/19/2023)     No current facility-administered medications on file prior to visit.    Review  of Systems  Constitutional:  Negative for chills and fever.  Respiratory:  Negative for cough.   Cardiovascular:  Negative for chest pain and palpitations.  Gastrointestinal:  Negative for nausea and vomiting.  Musculoskeletal:  Positive for back pain.  Neurological:  Negative for numbness.      Objective:    BP 138/80   Pulse 76   Temp 98 F (36.7 C) (Oral)   Ht 5' 2"$  (1.575 m)   Wt 170 lb 3.2 oz (77.2 kg)   SpO2 96%   BMI 31.13 kg/m  BP Readings from Last 3 Encounters:  01/19/23 138/80  12/29/22 (!) 140/84  10/28/22 (!) 144/88   Wt Readings from Last 3 Encounters:  01/19/23 170 lb 3.2 oz (77.2 kg)  12/29/22 167 lb 6 oz (75.9 kg)  10/28/22 172 lb (78 kg)    Physical Exam Vitals reviewed.  Constitutional:      Appearance: She is well-developed.  Eyes:     Conjunctiva/sclera: Conjunctivae normal.  Cardiovascular:     Rate and Rhythm: Normal rate and regular rhythm.     Pulses: Normal pulses.     Heart sounds: Normal heart sounds.  Pulmonary:     Effort: Pulmonary effort is normal.     Breath sounds: Normal breath sounds. No wheezing, rhonchi or rales.  Musculoskeletal:       Arms:     Lumbar back: No swelling, edema, spasms, tenderness or bony tenderness. Normal range of motion.     Comments: Full range of motion with flexion, tension, lateral side bends. No bony tenderness. No pain, numbness, tingling elicited with single leg raise bilaterally.   Skin:    General: Skin is warm and dry.  Neurological:     Mental Status: She is alert.     Sensory: No sensory deficit.     Deep Tendon Reflexes:     Reflex Scores:      Patellar reflexes are 2+ on the right side and 2+ on the left side.    Comments: Sensation and strength intact bilateral lower extremities.  Psychiatric:        Speech: Speech normal.        Behavior: Behavior normal.        Thought Content: Thought content normal.

## 2023-01-19 NOTE — Assessment & Plan Note (Signed)
Slightly elevated today.  Offered increasing losartan from 50 mg to 75 mg as recommended by Dr Charlestine Night.  Patient politely declines today.  She prefers to continue on Aldactone to 25 mg, losartan 50 mg daily, Toprol 25 mg daily and follow up with Dr Charlestine Night this spring.

## 2023-01-27 ENCOUNTER — Inpatient Hospital Stay: Admission: RE | Admit: 2023-01-27 | Payer: PPO | Source: Ambulatory Visit

## 2023-01-28 ENCOUNTER — Ambulatory Visit
Admission: RE | Admit: 2023-01-28 | Discharge: 2023-01-28 | Disposition: A | Discharge status: Home / Self Care | Payer: PPO | Source: Ambulatory Visit | Attending: Surgery | Admitting: Surgery

## 2023-01-28 DIAGNOSIS — E042 Nontoxic multinodular goiter: Secondary | ICD-10-CM

## 2023-01-28 IMAGING — US US THYROID
1 series · 12 of 25 positions shown · non-contrast
Comparison: None.

CLINICAL DATA: Bilateral thyroid nodules

EXAM:
THYROID ULTRASOUND
TECHNIQUE: Ultrasound examination of the thyroid gland and adjacent soft
tissues was performed.

[Series 1: us thyroid · 0.07mm/px · 12 of 47 slices shown]
[im 2/47]
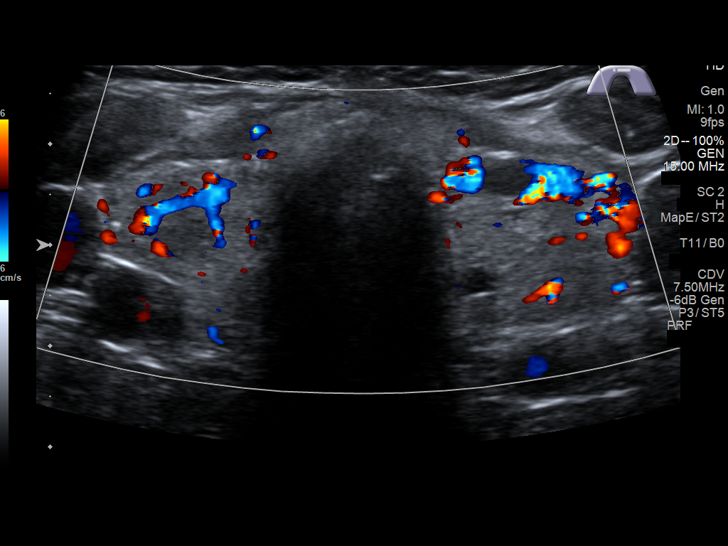
[im 6/47]
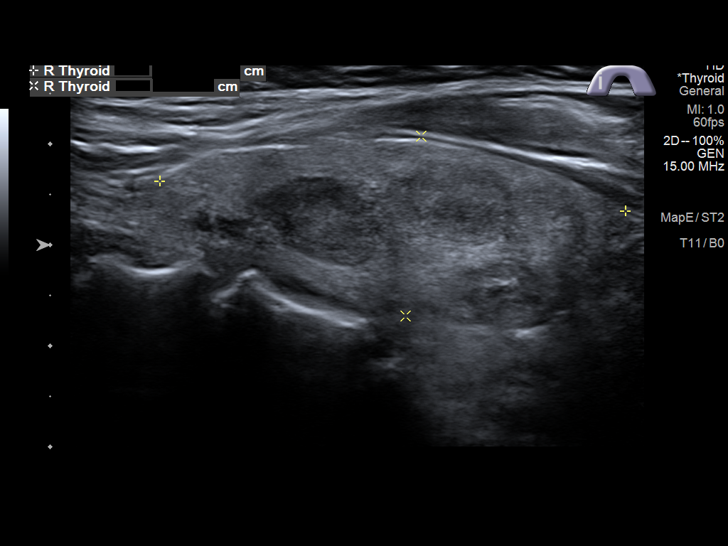
[im 10/47]
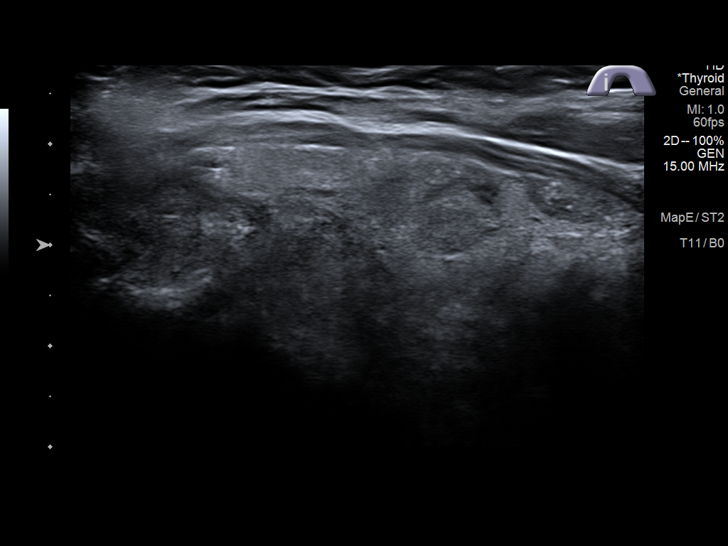
[im 14/47]
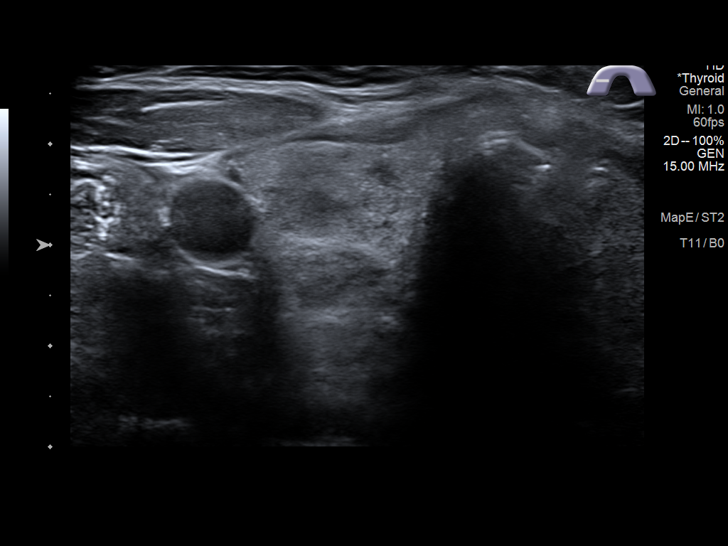
[im 18/47]
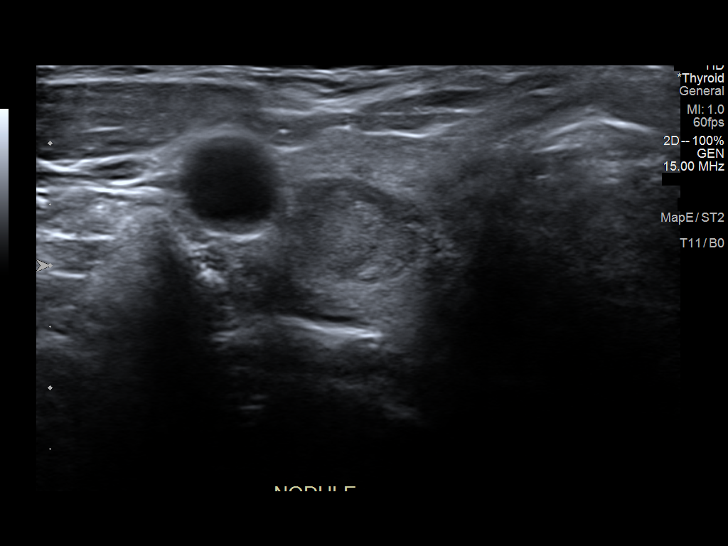
[im 22/47]
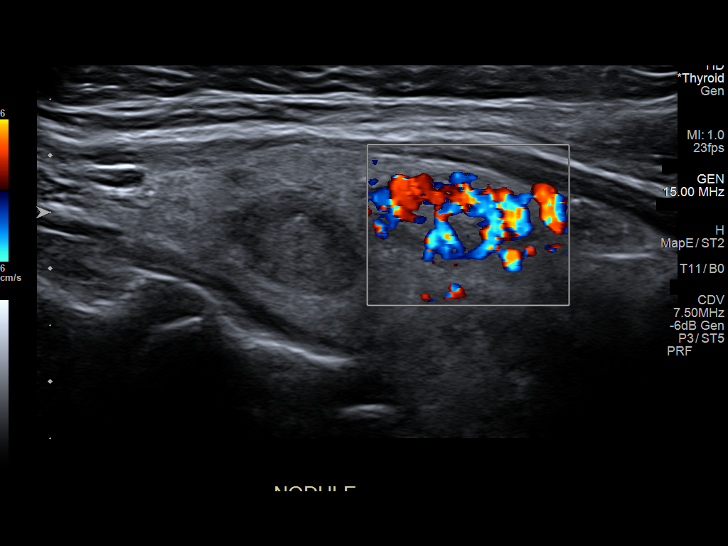
[im 25/47]
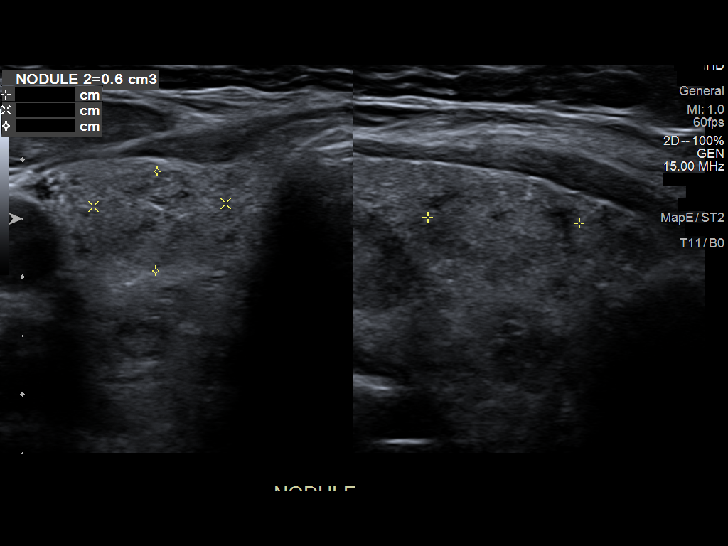
[im 29/47]
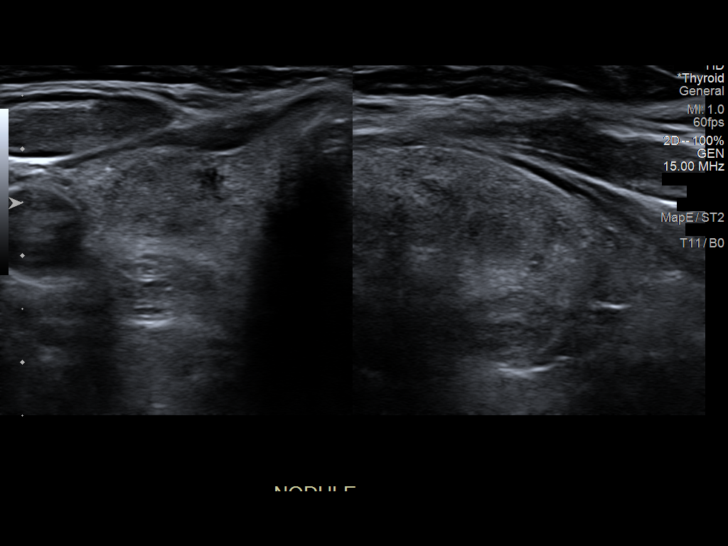
[im 33/47]
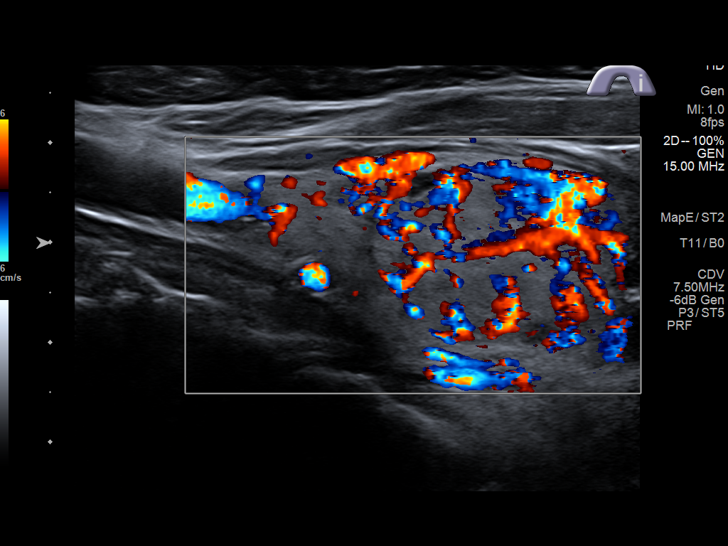
[im 37/47]
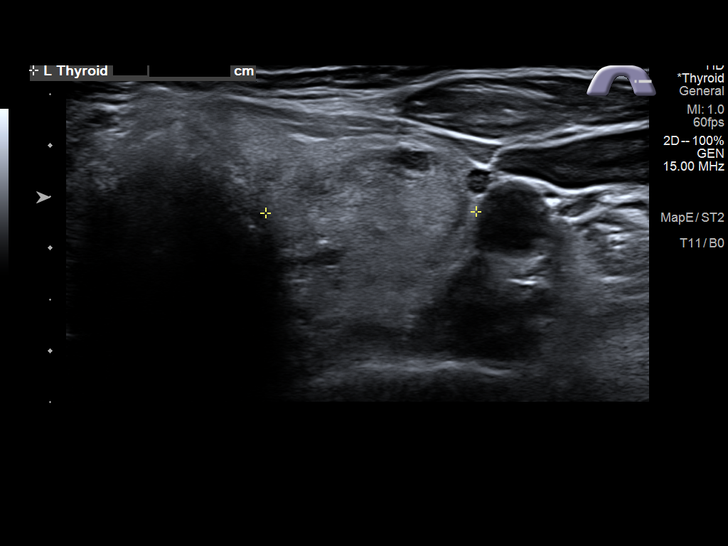
[im 41/47]
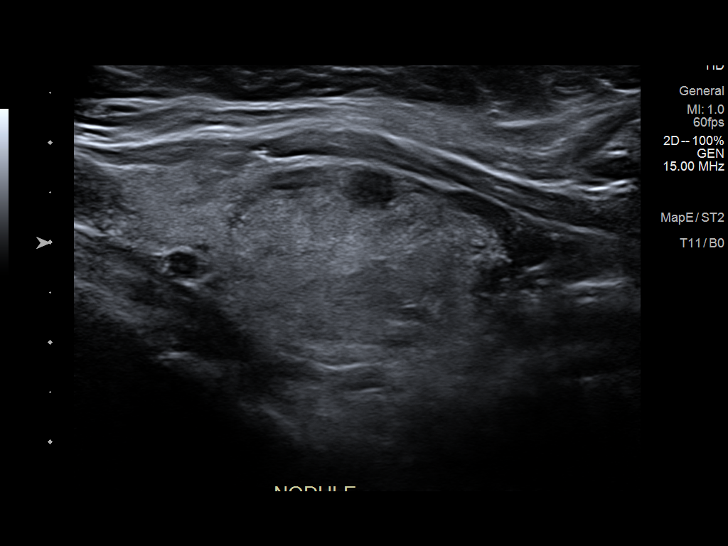
[im 45/47]
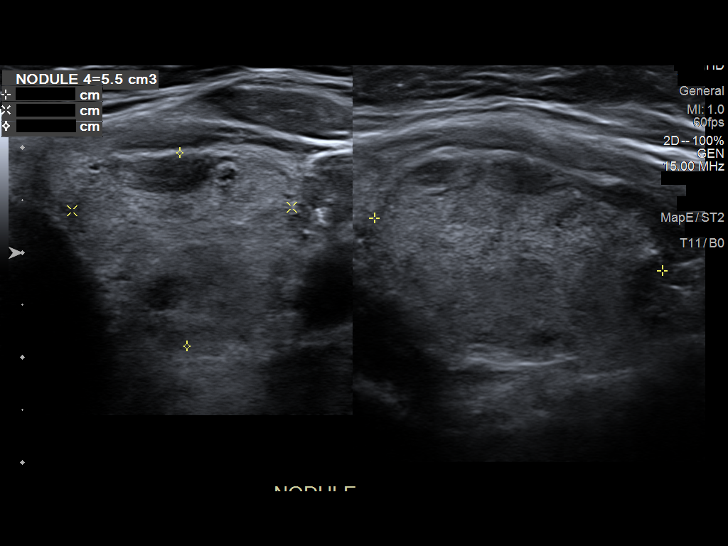

[12 of 25 positions shown; findings below may reference images not displayed]

FINDINGS: Parenchymal Echotexture: Mildly heterogenous

Isthmus: 0.3 cm

Right lobe: 4.6 x 1.8 x 1.8 cm

Left lobe: 5.1 x 2.3 x

_________________________________________________________

Estimated total number of nodules >/= 1 cm: 3

Number of spongiform nodules >/=  2 cm not described below (TR1): 0

Number of mixed cystic and solid nodules >/= 1.5 cm not described
below (TR2): 0

_________________________________________________________

Nodule # 1:

Location: Right; Superior

Maximum size: 1.3 cm; Other 2 dimensions: 0.9 x 0 8 cm

Composition: solid/almost completely solid (2)

Echogenicity: hypoechoic (2)

Shape: not taller-than-wide (0)

Margins: smooth (0)

Echogenic foci: none (0)

ACR TI-RADS total points: 4.

ACR TI-RADS risk category: TR4 (4-6 points).

ACR TI-RADS recommendations:

*Given size (>/= 1 - 1.4 cm) and appearance, a follow-up ultrasound
in 1 year should be considered based on TI-RADS criteria.

_________________________________________________________

Nodule # 2:

Location: Right; Mid

Maximum size: 1.3 cm; Other 2 dimensions: 1.1 x 0.9 cm

Composition: solid/almost completely solid (2)

Echogenicity: isoechoic (1)

Shape: not taller-than-wide (0)

Margins: ill-defined (0)

Echogenic foci: none (0)

ACR TI-RADS total points: 3.

ACR TI-RADS risk category: TR3 (3 points).

ACR TI-RADS recommendations:

Given size (<1.4 cm) and appearance, this nodule does NOT meet
TI-RADS criteria for biopsy or dedicated follow-up.

_________________________________________________________

Nodule # 3:

Location: Right; Inferior

Maximum size: 0.9 cm; Other 2 dimensions: 0.9 x 0.6 cm

Composition: solid/almost completely solid (2)

Echogenicity: hypoechoic (2)

Shape: not taller-than-wide (0)

Margins: ill-defined (0)

Echogenic foci: none (0)

ACR TI-RADS total points: 4.

ACR TI-RADS risk category: TR4 (4-6 points).

ACR TI-RADS recommendations:

Given size (<0.9 cm) and appearance, this nodule does NOT meet
TI-RADS criteria for biopsy or dedicated follow-up.

_________________________________________________________

Nodule # 4:

Location: Left; Mid

Maximum size: 2.8 cm; Other 2 dimensions: 2.1 x 1.8 cm

Composition: solid/almost completely solid (2)

Echogenicity: isoechoic (1)

Shape: not taller-than-wide (0)

Margins: ill-defined (0)

Echogenic foci: none (0)

ACR TI-RADS total points: 3.

ACR TI-RADS risk category: TR3 (3 points).

ACR TI-RADS recommendations:

**Given size (>/= 2.5 cm) and appearance, fine needle aspiration of
this mildly suspicious nodule should be considered based on TI-RADS
criteria.

_________________________________________________________
IMPRESSION: None

1. Multinodular thyroid gland.
2. Nodule labeled 4 in the left thyroid lobe meets criteria for
biopsy.
3. Nodule labeled 1 meets criteria for follow-up ultrasound in 1
year. No other suspicious nodules by TI-RADS criteria.

The above is in keeping with the ACR TI-RADS recommendations - [HOSPITAL] 3089;[DATE].

## 2023-02-01 ENCOUNTER — Ambulatory Visit
Admission: RE | Admit: 2023-02-01 | Discharge: 2023-02-01 | Disposition: A | Discharge status: Home / Self Care | Payer: PPO | Source: Ambulatory Visit | Attending: Family | Admitting: Family

## 2023-02-01 ENCOUNTER — Other Ambulatory Visit: Payer: Self-pay

## 2023-02-01 ENCOUNTER — Encounter: Payer: Self-pay | Admitting: Family

## 2023-02-01 DIAGNOSIS — J432 Centrilobular emphysema: Secondary | ICD-10-CM | POA: Diagnosis not present

## 2023-02-01 DIAGNOSIS — I700 Atherosclerosis of aorta: Secondary | ICD-10-CM | POA: Diagnosis not present

## 2023-02-01 DIAGNOSIS — I2510 Atherosclerotic heart disease of native coronary artery without angina pectoris: Secondary | ICD-10-CM | POA: Insufficient documentation

## 2023-02-01 DIAGNOSIS — Z87891 Personal history of nicotine dependence: Secondary | ICD-10-CM

## 2023-02-01 DIAGNOSIS — Z122 Encounter for screening for malignant neoplasm of respiratory organs: Secondary | ICD-10-CM | POA: Insufficient documentation

## 2023-02-01 MED ORDER — SPIRONOLACTONE 25 MG PO TABS: 25.0000 mg | ORAL_TABLET | Freq: Every day | ORAL | 3 refills | Status: DC

## 2023-02-03 ENCOUNTER — Other Ambulatory Visit: Payer: Self-pay | Admitting: Acute Care

## 2023-02-03 ENCOUNTER — Other Ambulatory Visit (INDEPENDENT_AMBULATORY_CARE_PROVIDER_SITE_OTHER): Payer: PPO

## 2023-02-03 DIAGNOSIS — G8929 Other chronic pain: Secondary | ICD-10-CM

## 2023-02-03 DIAGNOSIS — Z122 Encounter for screening for malignant neoplasm of respiratory organs: Secondary | ICD-10-CM

## 2023-02-03 DIAGNOSIS — M545 Low back pain, unspecified: Secondary | ICD-10-CM

## 2023-02-03 DIAGNOSIS — Z87891 Personal history of nicotine dependence: Secondary | ICD-10-CM

## 2023-02-03 LAB — HEMOGLOBIN A1C: Hgb A1c MFr Bld: 5.6 % (ref 4.6–6.5)

## 2023-02-03 LAB — LIPID PANEL
Cholesterol: 129 mg/dL (ref 0–200)
HDL: 70.8 mg/dL (ref >39.00)
LDL Cholesterol: 46 mg/dL (ref 0–99)
NonHDL: 58.06
Total CHOL/HDL Ratio: 2
Triglycerides: 58 mg/dL (ref 0.0–149.0)
VLDL: 11.6 mg/dL (ref 0.0–40.0)

## 2023-02-11 ENCOUNTER — Telehealth: Payer: Self-pay | Admitting: Family

## 2023-02-11 DIAGNOSIS — M5416 Radiculopathy, lumbar region: Secondary | ICD-10-CM | POA: Diagnosis not present

## 2023-02-11 NOTE — Telephone Encounter (Signed)
Call pt  I received results from your annual CT lung scan from [facility] which showed benign findings of lungs. You will need do this again next year so please ensure you are contacted directed from [facility] to schedule.   Please call our office if you are not contacted for another test.   It was noted again on the exam that you have coronary artery atherosclerosis, or often referred to as hardening of the arteries. Please continue repatha, follow up with cardiology

## 2023-02-14 NOTE — Telephone Encounter (Signed)
Spoke to pt and informed her that she needs to do Lung Cancer screening next year as well and if she does not hear from [facility] to give [place] a call the patient informed pt to continue [place] and her f/up with Cardiology as well. Pt verbalized understanding

## 2023-02-25 ENCOUNTER — Other Ambulatory Visit: Payer: Self-pay

## 2023-02-25 MED ORDER — LOSARTAN POTASSIUM 50 MG PO TABS: 50.0000 mg | ORAL_TABLET | Freq: Every day | ORAL | 3 refills | Status: DC

## 2023-03-09 ENCOUNTER — Ambulatory Visit
Admission: RE | Admit: 2023-03-09 | Discharge: 2023-03-09 | Disposition: A | Discharge status: Home / Self Care | Payer: PPO | Source: Ambulatory Visit | Attending: Family | Admitting: Family

## 2023-03-09 DIAGNOSIS — Z1231 Encounter for screening mammogram for malignant neoplasm of breast: Secondary | ICD-10-CM | POA: Diagnosis not present

## 2023-03-29 ENCOUNTER — Telehealth: Payer: Self-pay | Admitting: Family

## 2023-03-29 NOTE — Telephone Encounter (Signed)
Contacted Valerie Curry [place] to schedule their annual wellness visit. Patient declined to schedule AWV at this time.  Thank you,  [facility] Direct dial  [phone]

## 2023-03-31 ENCOUNTER — Ambulatory Visit: Payer: PPO | Attending: Cardiology

## 2023-03-31 DIAGNOSIS — I2510 Atherosclerotic heart disease of native coronary artery without angina pectoris: Secondary | ICD-10-CM | POA: Diagnosis not present

## 2023-03-31 LAB — ECHOCARDIOGRAM COMPLETE
AR max vel: 2.49 cm2
AV Area VTI: 2.81 cm2
AV Area mean vel: 2.5 cm2
AV Mean grad: 3.3 mmHg
AV Peak grad: 6 mmHg
Ao pk vel: 1.22 m/s
Area-P 1/2: 3.31 cm2
Calc EF: 36.6 %
P 1/2 time: 478 msec
S' Lateral: 3.9 cm
Single Plane A2C EF: 38.3 %
Single Plane A4C EF: 31.2 %

## 2023-04-04 ENCOUNTER — Other Ambulatory Visit: Payer: Self-pay

## 2023-04-04 DIAGNOSIS — I5020 Unspecified systolic (congestive) heart failure: Secondary | ICD-10-CM

## 2023-04-07 ENCOUNTER — Encounter: Payer: Self-pay | Admitting: Cardiology

## 2023-04-07 ENCOUNTER — Other Ambulatory Visit
Admission: RE | Admit: 2023-04-07 | Discharge: 2023-04-07 | Disposition: A | Discharge status: Home / Self Care | Payer: PPO | Attending: Cardiology | Admitting: Cardiology

## 2023-04-07 ENCOUNTER — Ambulatory Visit: Payer: PPO | Attending: Cardiology | Admitting: Cardiology

## 2023-04-07 VITALS — BP 132/84 | HR 67 | Ht 62.0 in | Wt 172.2 lb

## 2023-04-07 DIAGNOSIS — I10 Essential (primary) hypertension: Secondary | ICD-10-CM | POA: Diagnosis not present

## 2023-04-07 DIAGNOSIS — I428 Other cardiomyopathies: Secondary | ICD-10-CM

## 2023-04-07 DIAGNOSIS — I2510 Atherosclerotic heart disease of native coronary artery without angina pectoris: Secondary | ICD-10-CM

## 2023-04-07 LAB — HEMOGLOBIN AND HEMATOCRIT, BLOOD
HCT: 39.6 % (ref 36.0–46.0)
Hemoglobin: 13.2 g/dL (ref 12.0–15.0)

## 2023-04-07 MED ORDER — LOSARTAN POTASSIUM 100 MG PO TABS: 100.0000 mg | ORAL_TABLET | Freq: Every day | ORAL | 3 refills | Status: DC

## 2023-04-07 NOTE — Progress Notes (Signed)
Cardiology Office Note:    Date:  04/07/2023   ID:  Valerie Curry, [place] Oct 02, 1946, MRN [id number]  PCP:  Allegra Grana, FNP  Cardiologist:  Debbe Odea, MD  Electrophysiologist:  None   Referring MD: Allegra Grana, FNP   Chief Complaint  Patient presents with   Follow-up    Echo on 03/31/23.  Patient denies new or acute cardiac problems/concerns today.      History of Present Illness:    Valerie Curry is a 77 y.o. female with a hx of hypertension, NICM EF 30 to 35%, nonobstructive CAD (LHC 02/2022-20% RCA and LAD) , former smoker x40+ years who presents follow-up.   Being seen for nonischemic cardiomyopathy.  Tolerating Toprol-XL, losartan, Aldactone as prescribed.  Repeat echo last week showed unchanged ejection fraction 30 to 35%.  She denies chest pain, shortness of breath, edema.  Did not tolerate Entresto previously.   Prior notes Echo 03/2023 EF 30 to 35% Left heart cath 02/2022 20% RCA and LAD stenosis Echo 02/2022 EF 30 to 35% Did not tolerate Entresto due to itching CT chest lung cancer screening 11/2018 showed calcified plaque in LAD and RCA. 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   Past Medical History:  Diagnosis Date   Arthritis    Car sickness    Complication of anesthesia    hard to wake up   Coronary artery disease 11/2018   calcification in LAD and RCA in screening lung scan    Family history of malignant neoplasm of breast    Maternal/paternal aunts and cousin   Hypertension    Left bundle branch block    Lump or mass in breast 2011/2012   benign   Obesity, unspecified    Osteopenia    took Boniva 1 1/2 years in past-doesn't want to restart it   Personal history of tobacco use, presenting hazards to health    PONV (postoperative nausea and vomiting)    Positive PPD    as a child. xray was normal.   Systemic sclerosis [facility])     Past Surgical History:  Procedure Laterality Date   APPENDECTOMY  1961   BREAST BIOPSY  Right 09/2010   benign   BREAST BIOPSY Left 09/2011   dilated ducts-benign   BUNIONECTOMY Bilateral 2010   x 2   CHOLECYSTECTOMY N/A 10/03/2019   Procedure: LAPAROSCOPIC CHOLECYSTECTOMY;  Surgeon: Duanne Guess, MD;  Location: [facility] ORS;  Service: General;  Laterality: N/A;   COLONOSCOPY  06/2004   Dr. Mechele Collin   COLONOSCOPY WITH PROPOFOL N/A 01/10/2019   Procedure: COLONOSCOPY WITH PROPOFOL;  Surgeon: Pasty Spillers, MD;  Location: [facility] ENDOSCOPY;  Service: Endoscopy;  Laterality: N/A;   DECOMPRESSIVE LUMBAR LAMINECTOMY LEVEL 2 N/A 04/30/2015   Procedure: CENTRAL DECOMPRESSIVE LUMBER LAMINECTOMY WITH POSSIBLE FORAMINOTOMIES L3-L4,L4-L5;  Surgeon: Ranee Gosselin, MD;  Location: [facility] ORS;  Service: Orthopedics;  Laterality: N/A;   ERCP W/ SPHICTEROTOMY     ESOPHAGOGASTRODUODENOSCOPY (EGD) WITH PROPOFOL N/A 04/07/2021   Procedure: ESOPHAGOGASTRODUODENOSCOPY (EGD) WITH PROPOFOL;  Surgeon: Pasty Spillers, MD;  Location: [facility];  Service: Endoscopy;  Laterality: N/A;   LEFT HEART CATH AND CORONARY ANGIOGRAPHY Left 03/17/2022   Procedure: LEFT HEART CATH AND CORONARY ANGIOGRAPHY;  Surgeon: Yvonne Kendall, MD;  Location: [facility] INVASIVE CV LAB;  Service: Cardiovascular;  Laterality: Left;   POLYPECTOMY  04/07/2021   Procedure: POLYPECTOMY;  Surgeon: Pasty Spillers, MD;  Location: [facility];  Service: Endoscopy;;   TONSILLECTOMY  1968   TUBAL LIGATION  1974    Current Medications: Current Meds  Medication Sig   acetaminophen (TYLENOL) 500 MG tablet Take 500-1,000 mg by mouth every 6 (six) hours as needed for moderate pain.   gabapentin (NEURONTIN) 100 MG capsule Take 1 capsule by mouth 3 (three) times daily as needed.   meloxicam (MOBIC) 15 MG tablet Take 15 mg by mouth daily as needed for pain.   methocarbamol (ROBAXIN-750) 750 MG tablet Take 1 tablet (750 mg total) by mouth 3 (three) times daily as needed for muscle spasms.   metoprolol succinate  (TOPROL XL) 25 MG 24 hr tablet Take 1 tablet (25 mg total) by mouth daily.   Multiple Vitamins-Minerals (PRESERVISION AREDS 2 PO) Take 1 tablet by mouth 2 (two) times daily.   psyllium (METAMUCIL) 58.6 % packet Take 1 packet by mouth daily at 6 (six) AM.   rosuvastatin (CRESTOR) 20 MG tablet TAKE 1 TABLET(20 MG) BY MOUTH EVERY EVENING   spironolactone (ALDACTONE) 25 MG tablet Take 1 tablet (25 mg total) by mouth daily.   traMADol (ULTRAM) 50 MG tablet Take 1 tablet (50 mg total) by mouth 2 (two) times daily as needed.   [DISCONTINUED] losartan (COZAAR) 50 MG tablet Take 1 tablet (50 mg total) by mouth daily.     Allergies:   Patient has no known allergies.   Social History   Socioeconomic History   Marital status: Married    Spouse name: Not on file   Number of children: 2   Years of education: 15   Highest education level: Not on file  Occupational History   Occupation: Geophysical data processor: [employer]  Tobacco Use   Smoking status: Former    Packs/day: 1.00    Years: 50.00    Additional pack years: 0.00    Total pack years: 50.00    Types: Cigarettes    Quit date: 11/29/2014    Years since quitting: 8.3   Smokeless tobacco: Former    Quit date: 04/30/2015  Vaping Use   Vaping Use: Never used  Substance and Sexual Activity   Alcohol use: Yes    Comment: occasional glass of wine   Drug use: No   Sexual activity: Yes    Birth control/protection: Post-menopausal  Other Topics Concern   Not on file  Social History Narrative   Pt is 77 yo female. Pt was born in [place], moved to [place] when she was 68.  Pt is married to husband of 22 years.  Pt has 2 sons from a previous marriage and 4 step sons and 61 Grandchildren.        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        Pt works out at gym 3/week (2 days a week in a class, 1 day a week with an Comptroller).  Pt enjoys reading, spending time with  her family. Pt and her husband are members of [employer] and active in their church.    Social Determinants of Health   Financial Resource Strain: Not on file  Food Insecurity: Not on file  Transportation Needs: Not on file  Physical Activity: Not on file  Stress: Not on file  Social Connections: Not on file     Family History: The patient's family history includes Breast cancer in her cousin; Breast cancer (age of onset: 63) in her maternal aunt and paternal aunt; Cancer in her maternal aunt; Heart  disease in her mother; Heart disease (age of onset: 10) in her father; Hypertension in her mother; Lung cancer in her father; Other in her brother and another family member; Stroke in her mother.  ROS:   Please see the history of present illness.     All other systems reviewed and are negative.  EKGs/Labs/Other Studies Reviewed:    The following studies were reviewed today:   EKG:  EKG is ordered today.  EKG shows sinus rhythm, left bundle branch block, QRS duration 150  Recent Labs: 06/25/2022: ALT 17 01/07/2023: BUN 19; Creatinine, Ser 0.74; Potassium 4.1; Sodium 135  Recent Lipid Panel    Component Value Date/Time   CHOL 129 02/03/2023 0837   CHOL 149 10/01/2021 0823   TRIG 58.0 02/03/2023 0837   HDL 70.80 02/03/2023 0837   HDL 67 10/01/2021 0823   CHOLHDL 2 02/03/2023 0837   VLDL 11.6 02/03/2023 0837   LDLCALC 46 02/03/2023 0837   LDLCALC 70 10/01/2021 0823   LDLDIRECT 100.2 07/30/2008 1017    Physical Exam:    VS:  BP 132/84 (BP Location: Left Arm, Patient Position: Sitting, Cuff Size: Normal)   Pulse 67   Ht 5\' 2"  (1.575 m)   Wt 172 lb 3.2 oz (78.1 kg)   SpO2 97%   BMI 31.50 kg/m     Wt Readings from Last 3 Encounters:  04/07/23 172 lb 3.2 oz (78.1 kg)  01/19/23 170 lb 3.2 oz (77.2 kg)  12/29/22 167 lb 6 oz (75.9 kg)     GEN:  Well nourished, well developed in no acute distress HEENT: Normal NECK: No JVD; No carotid bruits CARDIAC: RRR, no murmurs, rubs,  gallops RESPIRATORY:  Clear to auscultation without rales, wheezing or rhonchi  ABDOMEN: Soft, non-tender, non-distended MUSCULOSKELETAL:  No edema; No deformity  SKIN: Warm and dry NEUROLOGIC:  Alert and oriented x 3 PSYCHIATRIC:  Normal affect   ASSESSMENT:    1. Coronary artery disease involving native coronary artery of native heart without angina pectoris   2. NICM (nonischemic cardiomyopathy) (HCC)   3. Primary hypertension    PLAN:    In order of problems listed above:  Nonobstructive CAD, (LHC 02/2022- 20% proximal LAD and mid RCA ).  Continue aspirin, Crestor.  LDL at goal.   Nonischemic cardiomyopathy, EF 30 to 35%.  Echo last week unchanged from prior.  EF 30 to 35%.  Heart rate 67.  Appears euvolemic.  NYHA class II symptoms.  Etiology possibly left bundle branch block cardiomyopathy, QRS duration 150.  Get cardiac MRI.  Increase losartan to 100 mg daily.  Continue Aldactone to 25 mg daily, Toprol-XL 25 mg daily.  Refer to advanced heart failure clinic.  Previously did not tolerate Entresto. Hypertension, BP Controlled.  Continue Toprol-XL, losartan, Aldactone.  Follow-up in 5 months   Medication Adjustments/Labs and Tests Ordered: Current medicines are reviewed at length with the patient today.  Concerns regarding medicines are outlined above.  Orders Placed This Encounter  Procedures   MR CARDIAC MORPHOLOGY W WO CONTRAST   Hemoglobin and hematocrit, blood   EKG 12-Lead   Meds ordered this encounter  Medications   losartan (COZAAR) 100 MG tablet    Sig: Take 1 tablet (100 mg total) by mouth daily.    Dispense:  90 tablet    Refill:  3    Patient Instructions  Medication Instructions:   INCREASE Losartan - Take one tablet ( 100mg ) by mouth daily.   *If you need a refill  on your cardiac medications before your next appointment, please call your pharmacy*   Lab Work:  Your physician recommends you go to the medical mall for lab work. If you have labs  (blood work) drawn today and your tests are completely normal, you will receive your results only by: MyChart Message (if you have MyChart) OR A paper copy in the mail If you have any lab test that is abnormal or we need to change your treatment, we will call you to review the results.   Testing/Procedures:    You are scheduled for Cardiac MRI on ______________. Please arrive for your appointment at ______________ ( arrive 30-45 minutes prior to test start time). ?  [facility] [address] [place], [place] [address] [phone] Please take advantage of the free valet parking available at the MAIN entrance (A entrance).  Proceed to the [facility] Radiology Department (First Floor) for check-in.   OR   [facility] [address] [place], [place] [address] [phone] Please take advantage of the free valet parking available at the MAIN entrance. Proceed to [facility] registration for check-in (first floor).  Magnetic resonance imaging (MRI) is a painless test that produces images of the inside of the body without using Xrays.  During an MRI, strong magnets and radio waves work together in a Data processing manager to form detailed images.   MRI images may provide more details about a medical condition than X-rays, CT scans, and ultrasounds can provide.  You may be given earphones to listen for instructions.  You may eat a light breakfast and take medications as ordered with the exception of furosemide, hydrochlorothiazide, or spironolactone(fluid pill, other). Please avoid stimulants for 12 hr prior to test. (Ie. Caffeine, nicotine, chocolate, or antihistamine medications)  If a contrast material will be used, an IV will be inserted into one of your veins. Contrast material will be injected into your IV. It will leave your body through your urine within a day. You may be told to drink plenty of fluids to help flush the contrast material out of your  system.  You will be asked to remove all metal, including: Watch, jewelry, and other metal objects including hearing aids, hair pieces and dentures. Also wearable glucose monitoring systems (ie. Freestyle Libre and Omnipods) (Braces and fillings normally are not a problem.)   TEST WILL TAKE APPROXIMATELY 1 HOUR  PLEASE NOTIFY SCHEDULING AT LEAST 24 HOURS IN ADVANCE IF YOU ARE UNABLE TO KEEP YOUR APPOINTMENT. [phone]  Please call Rockwell Alexandria, cardiac imaging nurse navigator with any questions/concerns. Rockwell Alexandria RN Navigator Cardiac Imaging Larey Brick RN Navigator Cardiac Imaging [facility] [phone] Office     Follow-Up: At [facility], you and your health needs are our priority.  As part of our continuing mission to provide you with exceptional heart care, we have created designated Provider Care Teams.  These Care Teams include your primary Cardiologist (physician) and Advanced Practice Providers (APPs -  Physician Assistants and Nurse Practitioners) who all work together to provide you with the care you need, when you need it.  We recommend signing up for the patient portal called "MyChart".  Sign up information is provided on this After Visit Summary.  MyChart is used to connect with patients for Virtual Visits (Telemedicine).  Patients are able to view lab/test results, encounter notes, upcoming appointments, etc.  Non-urgent messages can be sent to your provider as well.   To learn  more about what you can do with MyChart, go to [email or web address].    Your next appointment:   5 month(s)  Provider:   You may see Debbe Odea, MD or one of the following Advanced Practice Providers on your designated Care Team:   Nicolasa Ducking, NP Eula Listen, PA-C Cadence Fransico Michael, PA-C Charlsie Quest, NP   Other Instructions  [facility] at [facility] [phone]    Signed, Debbe Odea, MD  04/07/2023 11:09 AM    [facility]

## 2023-04-07 NOTE — Patient Instructions (Addendum)
Medication Instructions:   INCREASE Losartan - Take one tablet ( 100mg ) by mouth daily.   *If you need a refill on your cardiac medications before your next appointment, please call your pharmacy*   Lab Work:  Your physician recommends you go to the medical mall for lab work. If you have labs (blood work) drawn today and your tests are completely normal, you will receive your results only by: MyChart Message (if you have MyChart) OR A paper copy in the mail If you have any lab test that is abnormal or we need to change your treatment, we will call you to review the results.   Testing/Procedures:    You are scheduled for Cardiac MRI on ______________. Please arrive for your appointment at ______________ ( arrive 30-45 minutes prior to test start time). ?  [facility] [address] [place], [place] [address] [phone] Please take advantage of the free valet parking available at the MAIN entrance (A entrance).  Proceed to the [facility] Radiology Department (First Floor) for check-in.   OR   [facility] [address] [place], [place] [address] [phone] Please take advantage of the free valet parking available at the MAIN entrance. Proceed to [facility] registration for check-in (first floor).  Magnetic resonance imaging (MRI) is a painless test that produces images of the inside of the body without using Xrays.  During an MRI, strong magnets and radio waves work together in a Data processing manager to form detailed images.   MRI images may provide more details about a medical condition than X-rays, CT scans, and ultrasounds can provide.  You may be given earphones to listen for instructions.  You may eat a light breakfast and take medications as ordered with the exception of furosemide, hydrochlorothiazide, or spironolactone(fluid pill, other). Please avoid stimulants for 12 hr prior to test. (Ie. Caffeine, nicotine, chocolate, or  antihistamine medications)  If a contrast material will be used, an IV will be inserted into one of your veins. Contrast material will be injected into your IV. It will leave your body through your urine within a day. You may be told to drink plenty of fluids to help flush the contrast material out of your system.  You will be asked to remove all metal, including: Watch, jewelry, and other metal objects including hearing aids, hair pieces and dentures. Also wearable glucose monitoring systems (ie. Freestyle Libre and Omnipods) (Braces and fillings normally are not a problem.)   TEST WILL TAKE APPROXIMATELY 1 HOUR  PLEASE NOTIFY SCHEDULING AT LEAST 24 HOURS IN ADVANCE IF YOU ARE UNABLE TO KEEP YOUR APPOINTMENT. [phone]  Please call Rockwell Alexandria, cardiac imaging nurse navigator with any questions/concerns. Rockwell Alexandria RN Navigator Cardiac Imaging Larey Brick RN Navigator Cardiac Imaging [facility] [phone] Office     Follow-Up: At [facility], you and your health needs are our priority.  As part of our continuing mission to provide you with exceptional heart care, we have created designated Provider Care Teams.  These Care Teams include your primary Cardiologist (physician) and Advanced Practice Providers (APPs -  Physician Assistants and Nurse Practitioners) who all work together to provide you with the care you need, when you need it.  We recommend signing up for the patient portal called "MyChart".  Sign up information is provided on this After Visit Summary.  MyChart is used to connect with patients for Virtual Visits (Telemedicine).  Patients are able to view lab/test  results, encounter notes, upcoming appointments, etc.  Non-urgent messages can be sent to your provider as well.   To learn more about what you can do with MyChart, go to [email or web address].    Your next appointment:   5 month(s)  Provider:   You may see Debbe Odea, MD or one of the following Advanced Practice Providers on your designated Care Team:   Nicolasa Ducking, NP Eula Listen, PA-C Cadence Fransico Michael, PA-C Charlsie Quest, NP   Other Instructions  [facility] [facility] at [facility] [phone]

## 2023-04-08 ENCOUNTER — Other Ambulatory Visit: Payer: Self-pay

## 2023-04-08 MED ORDER — SPIRONOLACTONE 25 MG PO TABS: 25.0000 mg | ORAL_TABLET | Freq: Every day | ORAL | 3 refills | Status: DC

## 2023-05-03 ENCOUNTER — Telehealth (HOSPITAL_COMMUNITY): Payer: Self-pay | Admitting: Emergency Medicine

## 2023-05-03 NOTE — Telephone Encounter (Signed)
Reaching out to patient to offer assistance regarding upcoming cardiac imaging study; pt verbalizes understanding of appt date/time, parking situation and where to check in, pre-test NPO status and medications ordered, and verified current allergies; name and call back number provided for further questions should they arise Rondal Vandevelde RN Navigator Cardiac Imaging [facility] [phone] office [phone] cell 

## 2023-05-04 ENCOUNTER — Ambulatory Visit
Admission: RE | Admit: 2023-05-04 | Discharge: 2023-05-04 | Disposition: A | Discharge status: Home / Self Care | Payer: PPO | Source: Ambulatory Visit | Attending: Cardiology | Admitting: Cardiology

## 2023-05-04 ENCOUNTER — Other Ambulatory Visit: Payer: Self-pay | Admitting: Cardiology

## 2023-05-04 DIAGNOSIS — I428 Other cardiomyopathies: Secondary | ICD-10-CM

## 2023-05-04 MED ORDER — GADOBUTROL 1 MMOL/ML IV SOLN
10.0000 mL | Freq: Once | INTRAVENOUS | Status: AC | PRN
  Administered 2023-05-04: 10 mL via INTRAVENOUS

## 2023-05-09 ENCOUNTER — Other Ambulatory Visit (HOSPITAL_COMMUNITY): Payer: Self-pay

## 2023-05-09 ENCOUNTER — Ambulatory Visit: Payer: PPO | Attending: Cardiology | Admitting: Cardiology

## 2023-05-09 ENCOUNTER — Encounter: Payer: Self-pay | Admitting: Cardiology

## 2023-05-09 VITALS — BP 132/78 | HR 62 | Wt 173.0 lb

## 2023-05-09 DIAGNOSIS — I2510 Atherosclerotic heart disease of native coronary artery without angina pectoris: Secondary | ICD-10-CM | POA: Diagnosis not present

## 2023-05-09 DIAGNOSIS — I447 Left bundle-branch block, unspecified: Secondary | ICD-10-CM | POA: Diagnosis not present

## 2023-05-09 DIAGNOSIS — I429 Cardiomyopathy, unspecified: Secondary | ICD-10-CM | POA: Diagnosis not present

## 2023-05-09 DIAGNOSIS — I5020 Unspecified systolic (congestive) heart failure: Secondary | ICD-10-CM

## 2023-05-09 MED ORDER — CARVEDILOL 3.125 MG PO TABS: 3.1250 mg | ORAL_TABLET | Freq: Two times a day (BID) | ORAL | 3 refills | Status: DC

## 2023-05-09 MED ORDER — DAPAGLIFLOZIN PROPANEDIOL 10 MG PO TABS: 10.0000 mg | ORAL_TABLET | Freq: Every day | ORAL | 3 refills | Status: DC

## 2023-05-09 NOTE — Progress Notes (Signed)
ADVANCED HEART FAILURE CLINIC NOTE  Referring Physician: Allegra Grana, FNP  Primary Care: Allegra Grana, FNP Primary Cardiologist:  HPI: Valerie Curry is a 77 y.o. female with heart failure with reduced ejection fraction secondary to nonischemic cardiomyopathy, left bundle branch block, nonobstructive CAD, history of tobacco use for greater than 40 years presenting today to establish care.She was diagnosed with LBBB 4-5 years ago when undergoing cholecystecomty leading to TTE showing reduced LVEF. From a fucntional standpoint she is doing very well. She partakes in exercise classes 3x weekly currently for an hour with very minimal symptoms.  Today she presents for evaluation of her nonischemic cardiomyopathy and consideration of biventricular pacing   Past Medical History:  Diagnosis Date   Arthritis    Car sickness    Complication of anesthesia    hard to wake up   Coronary artery disease 11/2018   calcification in LAD and RCA in screening lung scan    Family history of malignant neoplasm of breast    Maternal/paternal aunts and cousin   Hypertension    Left bundle branch block    Lump or mass in breast 2011/2012   benign   Obesity, unspecified    Osteopenia    took Boniva 1 1/2 years in past-doesn't want to restart it   Personal history of tobacco use, presenting hazards to health    PONV (postoperative nausea and vomiting)    Positive PPD    as a child. xray was normal.   Systemic sclerosis (HCC)     Current Outpatient Medications  Medication Sig Dispense Refill   acetaminophen (TYLENOL) 500 MG tablet Take 500-1,000 mg by mouth every 6 (six) hours as needed for moderate pain.     gabapentin (NEURONTIN) 100 MG capsule Take 1 capsule by mouth 3 (three) times daily as needed.     losartan (COZAAR) 100 MG tablet Take 1 tablet (100 mg total) by mouth daily. 90 tablet 3   meloxicam (MOBIC) 15 MG tablet Take 15 mg by mouth daily as needed for pain.      methocarbamol (ROBAXIN-750) 750 MG tablet Take 1 tablet (750 mg total) by mouth 3 (three) times daily as needed for muscle spasms. 30 tablet 1   metoprolol succinate (TOPROL XL) 25 MG 24 hr tablet Take 1 tablet (25 mg total) by mouth daily. 90 tablet 0   Multiple Vitamins-Minerals (PRESERVISION AREDS 2 PO) Take 1 tablet by mouth 2 (two) times daily.     psyllium (METAMUCIL) 58.6 % packet Take 1 packet by mouth daily at 6 (six) AM.     rosuvastatin (CRESTOR) 20 MG tablet TAKE 1 TABLET(20 MG) BY MOUTH EVERY EVENING 90 tablet 3   spironolactone (ALDACTONE) 25 MG tablet Take 1 tablet (25 mg total) by mouth daily. 90 tablet 3   traMADol (ULTRAM) 50 MG tablet Take 1 tablet (50 mg total) by mouth 2 (two) times daily as needed. 30 tablet 1   No current facility-administered medications for this visit.    No Known Allergies    Social History   Socioeconomic History   Marital status: Married    Spouse name: Not on file   Number of children: 2   Years of education: 15   Highest education level: Not on file  Occupational History   Occupation: Geophysical data processor: [employer]  Tobacco Use   Smoking status: Former    Packs/day: 1.00    Years: 50.00    Additional pack  years: 0.00    Total pack years: 50.00    Types: Cigarettes    Quit date: 11/29/2014    Years since quitting: 8.4   Smokeless tobacco: Former    Quit date: 04/30/2015  Vaping Use   Vaping Use: Never used  Substance and Sexual Activity   Alcohol use: Yes    Comment: occasional glass of wine   Drug use: No   Sexual activity: Yes    Birth control/protection: Post-menopausal  Other Topics Concern   Not on file  Social History Narrative   Pt is 77 yo female. Pt was born in [place], moved to [place] when she was 68.  Pt is married to husband of 22 years.  Pt has 2 sons from a previous marriage and 4 step sons and 40 Grandchildren.        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        Pt works out at gym 3/week (2 days a week in a class, 1 day a week with an Comptroller).  Pt enjoys reading, spending time with her family. Pt and her husband are members of [employer] and active in their church.    Social Determinants of Health   Financial Resource Strain: Not on file  Food Insecurity: Not on file  Transportation Needs: Not on file  Physical Activity: Not on file  Stress: Not on file  Social Connections: Not on file  Intimate Partner Violence: Not on file      Family History  Problem Relation Age of Onset   Lung cancer Father    Heart disease Father 47       Cardiac Arrest   Heart disease Mother    Hypertension Mother    Stroke Mother    Other Brother        blood clots   Other Other        hypercoag-family history   Cancer Maternal Aunt        Breast cancer   Breast cancer Maternal Aunt 46   Breast cancer Paternal Aunt 63   Breast cancer Cousin     PHYSICAL EXAM: Vitals:   05/09/23 1103 05/09/23 1113  BP: (!) 162/83 132/78  Pulse: 62   SpO2: 100%    GENERAL: Well nourished, well developed, and in no apparent distress at rest.  HEENT: Negative for arcus senilis or xanthelasma. There is no scleral icterus.  The mucous membranes are pink and moist.   NECK: Supple, No masses. Normal carotid upstrokes without bruits. No masses or thyromegaly.    CHEST: There are no chest wall deformities. There is no chest wall tenderness. Respirations are unlabored.  Lungs-CTA bilaterally CARDIAC:  JVP: 7 cm H2O         Normal S1, S2  Normal rate with regular rhythm. No murmurs, rubs or gallops.  Pulses are 2+ and symmetrical in upper and lower extremities.  No edema.  ABDOMEN: Soft, non-tender, non-distended. There are no masses or hepatomegaly. There are normal bowel sounds.  EXTREMITIES: Warm and well perfused with no cyanosis, clubbing.  LYMPHATIC: No axillary or supraclavicular lymphadenopathy.  NEUROLOGIC: Patient is oriented  x3 with no focal or lateralizing neurologic deficits.  PSYCH: Patients affect is appropriate, there is no evidence of anxiety or depression.  SKIN: Warm and dry; no lesions or wounds.   DATA REVIEW  ECG: NSR w/ LBBB, QRSd  As per my personal interpretation  ECHO: 03/31/23: LVEF  30-35%, RV function is normal  as per my personal interpretation  CATH: 03/17/22: Mild, non-obstructive coronary artery disease with up to 20% stenosis involving the proximal LAD and mid RCA. Upper normal left ventricular filling pressure (LVEDP 15 mmHg).  CMR: 05/04/23 1. Normal LV size, moderately reduced LV function, LVEF 43%.  2. There is no late gadolinium enhancement in the left ventricular myocardium.  3. There is no evidence for myocardial infiltration.  4. Normal RV size and function.  5. Findings consistent with non ischemic cardiomyopathy.  ASSESSMENT & PLAN:  Heart failure reduced ejection fraction Etiology of HF: Nonischemic cardiomyopathy, likely secondary to underlying left bundle branch block.  Cardiac MRI with no LGE.  Left heart cath from April 2023 with minimal nonobstructive CAD. NYHA class / AHA Stage: NYHA II Volume status & Diuretics: Euvolemic Vasodilators: Losartan 100 mg daily, transition to Entresto at follow-up Beta-Blocker:d/c metoprolol; start coreg 3.125mg  BID MRA: Spironolactone 25 mg daily Cardiometabolic:start farxiga 10mg  daily Devices therapies & Valvulopathies: Patient would likely benefit from biventricular pacing.  After lengthy discussion with her she currently has no heart failure symptoms and has had an improvement in EF on cardiac MRI.  We will uptitrate GDMT, repeat echocardiogram and if she has persistently reduced EF we will plan to refer to EP for BiV pacing.  Her QRS duration is 150 ms and she would likely have an improvement in EF and functional status with device placement.  Can obtain CPX for more objective assessment of functional status Advanced  therapies: Not currently indicated see above  2. Nonobstructive CAD -Crestor 20 mg daily  3. HTN -See above -Repeat labs today  Tishie Altmann Advanced Heart Failure Mechanical Circulatory Support

## 2023-05-09 NOTE — Patient Instructions (Addendum)
Medication Changes:  Stop taking Metoprolol.  Start taking Coreg 3.125 mg (1 tablet) two times a day.  Start taking Farxiga 10 mg (1 tablet) daily.   Testing/Procedures:  Your physician has requested that you have an echocardiogram. Echocardiography is a painless test that uses sound waves to create images of your heart. It provides your doctor with information about the size and shape of your heart and how well your heart's chambers and valves are working. This procedure takes approximately one hour. There are no restrictions for this procedure. Please do NOT wear cologne, perfume, aftershave, or lotions (deodorant is allowed). Please arrive 15 minutes prior to your appointment time.  Special Instructions // Education:  Do the following things EVERYDAY: Weigh yourself in the morning before breakfast. Write it down and keep it in a log. Take your medicines as prescribed Eat low salt foods--Limit salt (sodium) to 2000 mg per day.  Stay as active as you can everyday Limit all fluids for the day to less than 2 liters   Follow-Up in: follow up in 3 months with Dr. Gasper Lloyd.    If you have any questions or concerns before your next appointment please send [place] a message through [facility] or call our office at [phone] Monday-Friday 8 am-5 pm.   If you have an urgent need after hours on the weekend please call your Primary Cardiologist or the Advanced Heart Failure Clinic in [place] at [phone].

## 2023-05-20 ENCOUNTER — Ambulatory Visit: Payer: PPO | Admitting: Family

## 2023-05-27 DIAGNOSIS — H2512 Age-related nuclear cataract, left eye: Secondary | ICD-10-CM | POA: Diagnosis not present

## 2023-05-27 DIAGNOSIS — H353131 Nonexudative age-related macular degeneration, bilateral, early dry stage: Secondary | ICD-10-CM | POA: Diagnosis not present

## 2023-05-27 DIAGNOSIS — H2511 Age-related nuclear cataract, right eye: Secondary | ICD-10-CM | POA: Diagnosis not present

## 2023-05-30 ENCOUNTER — Encounter: Payer: Self-pay | Admitting: Family

## 2023-05-30 ENCOUNTER — Ambulatory Visit (INDEPENDENT_AMBULATORY_CARE_PROVIDER_SITE_OTHER): Payer: PPO | Admitting: Family

## 2023-05-30 VITALS — BP 136/82 | HR 82 | Temp 98.1°F | Ht 62.0 in | Wt 166.8 lb

## 2023-05-30 DIAGNOSIS — I700 Atherosclerosis of aorta: Secondary | ICD-10-CM | POA: Diagnosis not present

## 2023-05-30 DIAGNOSIS — I5020 Unspecified systolic (congestive) heart failure: Secondary | ICD-10-CM

## 2023-05-30 NOTE — Progress Notes (Unsigned)
Assessment & Plan:  HFrEF (heart failure with reduced ejection fraction) (HCC) Assessment & Plan: Improved ejection fraction.  Overall asymptomatic.  Encouraged her to monitor for dizzy episodes and if for persistent to certainly reach out to Dr Gasper Lloyd to discuss de-escalation.  Continue losartan 100 mg daily, carvedilol 3.125 mg twice daily, Farxiga 10 mg daily, spironolactone 25 mg daily   Atherosclerosis of aorta (HCC) Assessment & Plan: Chronic, stable.  Continue Crestor 20 mg daily Lab Results  Component Value Date   LDLCALC 46 02/03/2023         Return precautions given.   Risks, benefits, and alternatives of the medications and treatment plan prescribed today were discussed, and patient expressed understanding.   Education regarding symptom management and diagnosis given to patient on AVS either electronically or printed.  Return in about 1 year (around 05/29/2024).  Rennie Plowman, FNP  Subjective:    Patient ID: Valerie Curry, female    DOB: Nov 09, 1946, 77 y.o.   MRN: [id number]  CC: Valerie Curry is a 77 y.o. female who presents today for follow up.   HPI: Feels well today.  No new complaints.  Specifically she is working at [employer] regularly without chest pain, shortness of breath or fatigue.  She has had self-limiting episodes of dizziness on new medications.  She has had systolic blood pressure readings under 100   follow up Dr Gasper Lloyd 05/09/23 to discuss nonischemic cardiomyopathy and consideration of biventricular pacing  She remains on losartan 100 mg daily.  Stopped metoprolol succinate and started on Coreg 3.125 mg twice daily.  Compliant with spironolactone 25 mg daily.  Started on Farxiga 10 mg daily.  Plan to repeat echocardiogram and if she has persistently reduced ejection fraction plan to refer to electrophysiology for biventricular pacing  Allergies: Patient has no known allergies. Current Outpatient Medications on File Prior to Visit   Medication Sig Dispense Refill   acetaminophen (TYLENOL) 500 MG tablet Take 500-1,000 mg by mouth every 6 (six) hours as needed for moderate pain.     carvedilol (COREG) 3.125 MG tablet Take 1 tablet (3.125 mg total) by mouth 2 (two) times daily with a meal. 90 tablet 3   dapagliflozin propanediol (FARXIGA) 10 MG TABS tablet Take 1 tablet (10 mg total) by mouth daily before breakfast. 90 tablet 3   gabapentin (NEURONTIN) 100 MG capsule Take 1 capsule by mouth 3 (three) times daily as needed.     losartan (COZAAR) 100 MG tablet Take 1 tablet (100 mg total) by mouth daily. 90 tablet 3   meloxicam (MOBIC) 15 MG tablet Take 15 mg by mouth daily as needed for pain.     methocarbamol (ROBAXIN-750) 750 MG tablet Take 1 tablet (750 mg total) by mouth 3 (three) times daily as needed for muscle spasms. 30 tablet 1   Multiple Vitamins-Minerals (PRESERVISION AREDS 2 PO) Take 1 tablet by mouth 2 (two) times daily.     psyllium (METAMUCIL) 58.6 % packet Take 1 packet by mouth daily at 6 (six) AM.     rosuvastatin (CRESTOR) 20 MG tablet TAKE 1 TABLET(20 MG) BY MOUTH EVERY EVENING 90 tablet 3   spironolactone (ALDACTONE) 25 MG tablet Take 1 tablet (25 mg total) by mouth daily. 90 tablet 3   traMADol (ULTRAM) 50 MG tablet Take 1 tablet (50 mg total) by mouth 2 (two) times daily as needed. 30 tablet 1   No current facility-administered medications on file prior to visit.    Review of  Systems  Constitutional:  Negative for chills and fever.  Eyes:  Negative for visual disturbance.  Respiratory:  Negative for cough.   Cardiovascular:  Negative for chest pain and palpitations.  Gastrointestinal:  Negative for nausea and vomiting.  Neurological:  Positive for dizziness.      Objective:    There were no vitals taken for this visit. BP Readings from Last 3 Encounters:  05/09/23 132/78  04/07/23 132/84  01/19/23 138/80   Wt Readings from Last 3 Encounters:  05/09/23 173 lb (78.5 kg)  04/07/23 172 lb 3.2  oz (78.1 kg)  01/19/23 170 lb 3.2 oz (77.2 kg)    Physical Exam Vitals reviewed.  Constitutional:      Appearance: She is well-developed.  Eyes:     Conjunctiva/sclera: Conjunctivae normal.  Cardiovascular:     Rate and Rhythm: Normal rate and regular rhythm.     Pulses: Normal pulses.     Heart sounds: Normal heart sounds.  Pulmonary:     Effort: Pulmonary effort is normal.     Breath sounds: Normal breath sounds. No wheezing, rhonchi or rales.  Skin:    General: Skin is warm and dry.  Neurological:     Mental Status: She is alert.  Psychiatric:        Speech: Speech normal.        Behavior: Behavior normal.        Thought Content: Thought content normal.

## 2023-05-30 NOTE — Assessment & Plan Note (Addendum)
Chronic, stable.  Continue Crestor 20 mg daily Lab Results  Component Value Date   LDLCALC 46 02/03/2023

## 2023-05-30 NOTE — Assessment & Plan Note (Signed)
Improved ejection fraction.  Overall asymptomatic.  Encouraged her to monitor for dizzy episodes and if for persistent to certainly reach out to Dr Gasper Lloyd to discuss de-escalation.  Continue losartan 100 mg daily, carvedilol 3.125 mg twice daily, Farxiga 10 mg daily, spironolactone 25 mg daily

## 2023-05-31 ENCOUNTER — Encounter: Payer: Self-pay | Admitting: Family

## 2023-06-14 ENCOUNTER — Encounter: Payer: Self-pay | Admitting: Ophthalmology

## 2023-06-14 DIAGNOSIS — H2511 Age-related nuclear cataract, right eye: Secondary | ICD-10-CM | POA: Diagnosis not present

## 2023-06-15 ENCOUNTER — Encounter: Payer: Self-pay | Admitting: Ophthalmology

## 2023-06-15 NOTE — Anesthesia Preprocedure Evaluation (Addendum)
Anesthesia Evaluation  Patient identified by MRN, date of birth, ID band Patient awake    Reviewed: Allergy & Precautions, NPO status , Patient's Chart, lab work & pertinent test results, reviewed documented beta blocker date and time   History of Anesthesia Complications (+) PONV and history of anesthetic complications  Airway Mallampati: III   Neck ROM: Full    Dental  (+) Missing   Pulmonary former smoker (quit 2016)   Pulmonary exam normal breath sounds clear to auscultation       Cardiovascular hypertension, + CAD (nonobstructive), + Peripheral Vascular Disease and +CHF (NICM, EF 30-35%)  Normal cardiovascular exam Rhythm:Regular Rate:Normal     Neuro/Psych negative neurological ROS     GI/Hepatic negative GI ROS,,,  Endo/Other  Obesity   Renal/GU negative Renal ROS     Musculoskeletal  (+) Arthritis ,    Abdominal   Peds  Hematology negative hematology ROS (+)   Anesthesia Other Findings   Reproductive/Obstetrics                             Anesthesia Physical Anesthesia Plan  ASA: 3  Anesthesia Plan: MAC   Post-op Pain Management:    Induction: Intravenous  PONV Risk Score and Plan: 3 and Treatment may vary due to age or medical condition, Midazolam and TIVA  Airway Management Planned: Natural Airway and Nasal Cannula  Additional Equipment:   Intra-op Plan:   Post-operative Plan:   Informed Consent: I have reviewed the patients History and Physical, chart, labs and discussed the procedure including the risks, benefits and alternatives for the proposed anesthesia with the patient or authorized representative who has indicated his/her understanding and acceptance.     Dental advisory given  Plan Discussed with: CRNA  Anesthesia Plan Comments: (LMA/GETA backup discussed.  Patient consented for risks of anesthesia including but not limited to:  - adverse reactions  to medications - damage to eyes, teeth, lips or other oral mucosa - nerve damage due to positioning  - sore throat or hoarseness - damage to heart, brain, nerves, lungs, other parts of body or loss of life  Informed patient about role of CRNA in peri- and intra-operative care.  Patient voiced understanding.)       Anesthesia Quick Evaluation

## 2023-06-17 NOTE — Discharge Instructions (Signed)

## 2023-06-21 ENCOUNTER — Ambulatory Visit: Payer: PPO | Admitting: Anesthesiology

## 2023-06-21 ENCOUNTER — Encounter: Payer: Self-pay | Admitting: Ophthalmology

## 2023-06-21 ENCOUNTER — Other Ambulatory Visit: Payer: Self-pay

## 2023-06-21 ENCOUNTER — Ambulatory Visit
Admission: RE | Admit: 2023-06-21 | Discharge: 2023-06-21 | Disposition: A | Discharge status: Home / Self Care | Payer: PPO | Source: Ambulatory Visit | Attending: Ophthalmology | Admitting: Ophthalmology

## 2023-06-21 ENCOUNTER — Encounter
Admission: RE | Disposition: A | Discharge status: Home / Self Care | Payer: Self-pay | Source: Ambulatory Visit | Attending: Ophthalmology

## 2023-06-21 DIAGNOSIS — I739 Peripheral vascular disease, unspecified: Secondary | ICD-10-CM | POA: Insufficient documentation

## 2023-06-21 DIAGNOSIS — I5020 Unspecified systolic (congestive) heart failure: Secondary | ICD-10-CM | POA: Diagnosis not present

## 2023-06-21 DIAGNOSIS — M1990 Unspecified osteoarthritis, unspecified site: Secondary | ICD-10-CM | POA: Diagnosis not present

## 2023-06-21 DIAGNOSIS — I110 Hypertensive heart disease with heart failure: Secondary | ICD-10-CM | POA: Insufficient documentation

## 2023-06-21 DIAGNOSIS — E669 Obesity, unspecified: Secondary | ICD-10-CM | POA: Diagnosis not present

## 2023-06-21 DIAGNOSIS — I2510 Atherosclerotic heart disease of native coronary artery without angina pectoris: Secondary | ICD-10-CM | POA: Insufficient documentation

## 2023-06-21 DIAGNOSIS — Z6830 Body mass index (BMI) 30.0-30.9, adult: Secondary | ICD-10-CM | POA: Diagnosis not present

## 2023-06-21 DIAGNOSIS — I5032 Chronic diastolic (congestive) heart failure: Secondary | ICD-10-CM | POA: Insufficient documentation

## 2023-06-21 DIAGNOSIS — Z8249 Family history of ischemic heart disease and other diseases of the circulatory system: Secondary | ICD-10-CM | POA: Diagnosis not present

## 2023-06-21 DIAGNOSIS — H2511 Age-related nuclear cataract, right eye: Secondary | ICD-10-CM | POA: Insufficient documentation

## 2023-06-21 DIAGNOSIS — Z87891 Personal history of nicotine dependence: Secondary | ICD-10-CM | POA: Insufficient documentation

## 2023-06-21 DIAGNOSIS — I428 Other cardiomyopathies: Secondary | ICD-10-CM | POA: Diagnosis not present

## 2023-06-21 DIAGNOSIS — H269 Unspecified cataract: Secondary | ICD-10-CM | POA: Diagnosis not present

## 2023-06-21 HISTORY — DX: Nonrheumatic aortic (valve) insufficiency: I35.1

## 2023-06-21 HISTORY — DX: Nonrheumatic mitral (valve) insufficiency: I34.0

## 2023-06-21 HISTORY — DX: Chronic diastolic (congestive) heart failure: I50.32

## 2023-06-21 HISTORY — DX: Other ill-defined heart diseases: I51.89

## 2023-06-21 HISTORY — PX: CATARACT EXTRACTION W/PHACO: SHX586

## 2023-06-21 SURGERY — PHACOEMULSIFICATION, CATARACT, WITH IOL INSERTION
Anesthesia: Monitor Anesthesia Care | Laterality: Right

## 2023-06-21 MED ORDER — SIGHTPATH DOSE#1 BSS IO SOLN
INTRAOCULAR | Status: DC | PRN
  Administered 2023-06-21: 2 mL

## 2023-06-21 MED ORDER — SIGHTPATH DOSE#1 BSS IO SOLN
INTRAOCULAR | Status: DC | PRN
  Administered 2023-06-21: 66 mL via OPHTHALMIC

## 2023-06-21 MED ORDER — ARMC OPHTHALMIC DILATING DROPS
1.0000 | OPHTHALMIC | Status: DC | PRN
  Administered 2023-06-21 (×3): 1 via OPHTHALMIC

## 2023-06-21 MED ORDER — BRIMONIDINE TARTRATE-TIMOLOL 0.2-0.5 % OP SOLN
OPHTHALMIC | Status: DC | PRN
  Administered 2023-06-21: 1 [drp] via OPHTHALMIC

## 2023-06-21 MED ORDER — SIGHTPATH DOSE#1 BSS IO SOLN
INTRAOCULAR | Status: DC | PRN
  Administered 2023-06-21: 15 mL via INTRAOCULAR

## 2023-06-21 MED ORDER — LACTATED RINGERS IV SOLN: INTRAVENOUS | Status: DC

## 2023-06-21 MED ORDER — MIDAZOLAM HCL 2 MG/2ML IJ SOLN
INTRAMUSCULAR | Status: DC | PRN
  Administered 2023-06-21: 2 mg via INTRAVENOUS

## 2023-06-21 MED ORDER — FENTANYL CITRATE (PF) 100 MCG/2ML IJ SOLN
INTRAMUSCULAR | Status: DC | PRN
  Administered 2023-06-21: 2 ug via INTRAVENOUS

## 2023-06-21 MED ORDER — TETRACAINE HCL 0.5 % OP SOLN
1.0000 [drp] | OPHTHALMIC | Status: DC | PRN
  Administered 2023-06-21 (×2): 1 [drp] via OPHTHALMIC

## 2023-06-21 MED ORDER — MOXIFLOXACIN HCL 0.5 % OP SOLN
OPHTHALMIC | Status: DC | PRN
  Administered 2023-06-21: .2 mL via OPHTHALMIC

## 2023-06-21 MED ORDER — SIGHTPATH DOSE#1 NA CHONDROIT SULF-NA HYALURON 40-17 MG/ML IO SOLN
INTRAOCULAR | Status: DC | PRN
  Administered 2023-06-21: 1 mL via INTRAOCULAR

## 2023-06-21 SURGICAL SUPPLY — 11 items
ANGLE REVERSE CUT SHRT 25GA (CUTTER) ×1
CATARACT SUITE SIGHTPATH (MISCELLANEOUS) ×1 IMPLANT
CYSTOTOME ANGL RVRS SHRT 25G (CUTTER) ×1 IMPLANT
CYSTOTOME ANGL RVRS SHRT 25GA (CUTTER) ×1 IMPLANT
FEE CATARACT SUITE SIGHTPATH (MISCELLANEOUS) ×1 IMPLANT
GLOVE BIOGEL PI IND STRL 8 (GLOVE) ×1 IMPLANT
GLOVE SURG ENC TEXT LTX SZ8 (GLOVE) ×1 IMPLANT
LENS IOL TECNIS EYHANCE 20.5 (Intraocular Lens) IMPLANT
NDL FILTER BLUNT 18X1 1/2 (NEEDLE) ×1 IMPLANT
NEEDLE FILTER BLUNT 18X1 1/2 (NEEDLE) ×1 IMPLANT
SYR 3ML LL SCALE MARK (SYRINGE) ×1 IMPLANT

## 2023-06-21 NOTE — Op Note (Signed)
PREOPERATIVE DIAGNOSIS:  Nuclear sclerotic cataract of the right eye.   POSTOPERATIVE DIAGNOSIS:  Cataract   OPERATIVE PROCEDURE:ORPROCALL@   SURGEON:  Galen Manila, MD.   ANESTHESIA:  Anesthesiologist: Reed Breech, MD CRNA: Domenic Moras, CRNA  1.      Managed anesthesia care. 2.      0.23ml of Shugarcaine was instilled in the eye following the paracentesis.   COMPLICATIONS:  None.   TECHNIQUE:   Stop and chop   DESCRIPTION OF PROCEDURE:  The patient was examined and consented in the preoperative holding area where the aforementioned topical anesthesia was applied to the right eye and then brought back to the Operating Room where the right eye was prepped and draped in the usual sterile ophthalmic fashion and a lid speculum was placed. A paracentesis was created with the side port blade and the anterior chamber was filled with viscoelastic. A near clear corneal incision was performed with the steel keratome. A continuous curvilinear capsulorrhexis was performed with a cystotome followed by the capsulorrhexis forceps. Hydrodissection and hydrodelineation were carried out with BSS on a blunt cannula. The lens was removed in a stop and chop  technique and the remaining cortical material was removed with the irrigation-aspiration handpiece. The capsular bag was inflated with viscoelastic and the Technis [id number]  lens was placed in the capsular bag without complication. The remaining viscoelastic was removed from the eye with the irrigation-aspiration handpiece. The wounds were hydrated. The anterior chamber was flushed with BSS and the eye was inflated to physiologic pressure. 0.63ml of Vigamox was placed in the anterior chamber. The wounds were found to be water tight. The eye was dressed with Combigan. The patient was given protective glasses to wear throughout the day and a shield with which to sleep tonight. The patient was also given drops with which to begin a drop regimen today and  will follow-up with me in one day. Implant Name Type Inv. Item Serial No. Manufacturer Lot No. LRB No. Used Action  LENS IOL TECNIS EYHANCE 20.5 - [id number] Intraocular Lens LENS IOL TECNIS EYHANCE 20.5 [id number] [employer]  Right 1 Implanted   Procedure(s): CATARACT EXTRACTION PHACO AND INTRAOCULAR LENS PLACEMENT (IOC) RIGHT 21.59 01:32.9 (Right)  Electronically signed: Galen Manila 06/21/2023 12:30 PM

## 2023-06-21 NOTE — Anesthesia Postprocedure Evaluation (Signed)
Anesthesia Post Note  Patient: Valerie Curry  Procedure(s) Performed: CATARACT EXTRACTION PHACO AND INTRAOCULAR LENS PLACEMENT (IOC) RIGHT 21.59 01:32.9 (Right)  Patient location during evaluation: PACU Anesthesia Type: MAC Level of consciousness: awake and alert, oriented and patient cooperative Pain management: pain level controlled Vital Signs Assessment: post-procedure vital signs reviewed and stable Respiratory status: spontaneous breathing, nonlabored ventilation and respiratory function stable Cardiovascular status: blood pressure returned to baseline and stable Postop Assessment: adequate PO intake Anesthetic complications: no   No notable events documented.   Last Vitals:  Vitals:   06/21/23 1235 06/21/23 1239  BP: 138/74 135/81  Pulse: 66 66  Resp: (!) 23 18  Temp: (!) 36.3 C   SpO2: 98% 98%    Last Pain:  Vitals:   06/21/23 1235  TempSrc:   PainSc: 0-No pain                 Reed Breech

## 2023-06-21 NOTE — Transfer of Care (Signed)
Immediate Anesthesia Transfer of Care Note  Patient: Valerie Curry  Procedure(s) Performed: CATARACT EXTRACTION PHACO AND INTRAOCULAR LENS PLACEMENT (IOC) RIGHT 21.59 01:32.9 (Right)  Patient Location: PACU  Anesthesia Type: MAC  Level of Consciousness: awake, alert  and patient cooperative  Airway and Oxygen Therapy: Patient Spontanous Breathing and Patient connected to supplemental oxygen  Post-op Assessment: Post-op Vital signs reviewed, Patient's Cardiovascular Status Stable, Respiratory Function Stable, Patent Airway and No signs of Nausea or vomiting  Post-op Vital Signs: Reviewed and stable  Complications: No notable events documented.

## 2023-06-21 NOTE — H&P (Signed)
[facility]   Primary Care Physician:  Allegra Grana, FNP Ophthalmologist: Dr. Druscilla Brownie  Pre-Procedure History & Physical: HPI:  Valerie Curry is a 77 y.o. female here for cataract surgery.   Past Medical History:  Diagnosis Date   Arthritis    Car sickness    Complication of anesthesia    hard to wake up   Coronary artery disease 11/2018   calcification in LAD and RCA in screening lung scan    Family history of malignant neoplasm of breast    Maternal/paternal aunts and cousin   Grade I diastolic dysfunction    Heart failure with improved ejection fraction (HFimpEF) (HCC)    Hypertension    Left bundle branch block    Lump or mass in breast 2011/2012   benign   Mild aortic regurgitation    Mild mitral regurgitation by prior echocardiogram    Obesity, unspecified    Osteopenia    took Boniva 1 1/2 years in past-doesn't want to restart it   Personal history of tobacco use, presenting hazards to health    PONV (postoperative nausea and vomiting)    Positive PPD    as a child. xray was normal.   Systemic sclerosis [facility])     Past Surgical History:  Procedure Laterality Date   APPENDECTOMY  1961   BREAST BIOPSY Right 09/2010   benign   BREAST BIOPSY Left 09/2011   dilated ducts-benign   BUNIONECTOMY Bilateral 2010   x 2   CHOLECYSTECTOMY N/A 10/03/2019   Procedure: LAPAROSCOPIC CHOLECYSTECTOMY;  Surgeon: Duanne Guess, MD;  Location: [facility] ORS;  Service: General;  Laterality: N/A;   COLONOSCOPY  06/2004   Dr. Mechele Collin   COLONOSCOPY WITH PROPOFOL N/A 01/10/2019   Procedure: COLONOSCOPY WITH PROPOFOL;  Surgeon: Pasty Spillers, MD;  Location: [facility] ENDOSCOPY;  Service: Endoscopy;  Laterality: N/A;   DECOMPRESSIVE LUMBAR LAMINECTOMY LEVEL 2 N/A 04/30/2015   Procedure: CENTRAL DECOMPRESSIVE LUMBER LAMINECTOMY WITH POSSIBLE FORAMINOTOMIES L3-L4,L4-L5;  Surgeon: Ranee Gosselin, MD;  Location: [facility];  Service: Orthopedics;  Laterality: N/A;   ERCP W/  SPHICTEROTOMY     ESOPHAGOGASTRODUODENOSCOPY (EGD) WITH PROPOFOL N/A 04/07/2021   Procedure: ESOPHAGOGASTRODUODENOSCOPY (EGD) WITH PROPOFOL;  Surgeon: Pasty Spillers, MD;  Location: [facility];  Service: Endoscopy;  Laterality: N/A;   LEFT HEART CATH AND CORONARY ANGIOGRAPHY Left 03/17/2022   Procedure: LEFT HEART CATH AND CORONARY ANGIOGRAPHY;  Surgeon: Yvonne Kendall, MD;  Location: [facility] INVASIVE CV LAB;  Service: Cardiovascular;  Laterality: Left;   POLYPECTOMY  04/07/2021   Procedure: POLYPECTOMY;  Surgeon: Pasty Spillers, MD;  Location: [facility];  Service: Endoscopy;;   TONSILLECTOMY  1968   TUBAL LIGATION  1974    Prior to Admission medications   Medication Sig Start Date End Date Taking? Authorizing Provider  acetaminophen (TYLENOL) 500 MG tablet Take 500-1,000 mg by mouth every 6 (six) hours as needed for moderate pain.   Yes [provider]  Calcium Citrate-Vitamin D (CALCIUM CITRATE+D3 PO) Take by mouth daily. 400/500   Yes [provider]  carvedilol (COREG) 3.125 MG tablet Take 1 tablet (3.125 mg total) by mouth 2 (two) times daily with a meal. 05/09/23  Yes Sabharwal, Aditya, DO  dapagliflozin propanediol (FARXIGA) 10 MG TABS tablet Take 1 tablet (10 mg total) by mouth daily before breakfast. 05/09/23  Yes Sabharwal, Aditya, DO  gabapentin (NEURONTIN) 100 MG capsule Take 1 capsule by mouth 3 (three) times daily as needed. 10/01/22  Yes [provider]  losartan (COZAAR) 100 MG tablet Take 1 tablet (100 mg total) by mouth daily. 04/07/23 07/06/23 Yes Agbor-Etang, Arlys John, MD  meloxicam (MOBIC) 15 MG tablet Take 15 mg by mouth daily as needed for pain. 06/02/20  Yes [provider]  methocarbamol (ROBAXIN-750) 750 MG tablet Take 1 tablet (750 mg total) by mouth 3 (three) times daily as needed for muscle spasms. 01/19/23  Yes Arnett, Lyn Records, FNP  Multiple Vitamins-Minerals (PRESERVISION AREDS 2 PO) Take 1 tablet by mouth 2  (two) times daily.   Yes [provider]  psyllium (METAMUCIL) 58.6 % packet Take 1 packet by mouth daily at 6 (six) AM.   Yes [provider]  rosuvastatin (CRESTOR) 20 MG tablet TAKE 1 TABLET(20 MG) BY MOUTH EVERY EVENING 10/05/22  Yes Allegra Grana, FNP  spironolactone (ALDACTONE) 25 MG tablet Take 1 tablet (25 mg total) by mouth daily. 04/08/23 04/02/24 Yes Agbor-Etang, Arlys John, MD  traMADol (ULTRAM) 50 MG tablet Take 1 tablet (50 mg total) by mouth 2 (two) times daily as needed. 01/19/23  Yes Allegra Grana, FNP    Allergies as of 05/31/2023   (No Known Allergies)    Family History  Problem Relation Age of Onset   Lung cancer Father    Heart disease Father 35       Cardiac Arrest   Heart disease Mother    Hypertension Mother    Stroke Mother    Other Brother        blood clots   Other Other        hypercoag-family history   Cancer Maternal Aunt        Breast cancer   Breast cancer Maternal Aunt 63   Breast cancer Paternal Aunt 74   Breast cancer Cousin     Social History   Socioeconomic History   Marital status: Married    Spouse name: Not on file   Number of children: 2   Years of education: 15   Highest education level: Not on file  Occupational History   Occupation: Geophysical data processor: [employer]  Tobacco Use   Smoking status: Former    Current packs/day: 0.00    Average packs/day: 1 pack/day for 50.0 years (50.0 ttl pk-yrs)    Types: Cigarettes    Start date: 11/29/1964    Quit date: 11/29/2014    Years since quitting: 8.5   Smokeless tobacco: Former    Quit date: 04/30/2015  Vaping Use   Vaping status: Never Used  Substance and Sexual Activity   Alcohol use: Yes    Comment: occasional glass of wine   Drug use: No   Sexual activity: Yes    Birth control/protection: Post-menopausal  Other Topics Concern   Not on file  Social History Narrative   Pt is 77 yo female. Pt was born in [place], moved to [place] when she was 4.  Pt is married to husband of 22 years.  Pt has 2 sons from a previous marriage and 4 step sons and 61 Grandchildren.        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        Pt works out at gym 3/week (2 days a week in a class, 1 day a week with an Comptroller).  Pt enjoys reading, spending time with her family. Pt and her husband are members of [employer] and active in their church.  Social Determinants of Health   Financial Resource Strain: Low Risk  (05/26/2023)   Overall Financial Resource Strain (CARDIA)    Difficulty of Paying Living Expenses: Not very hard  Food Insecurity: No Food Insecurity (05/26/2023)   Hunger Vital Sign    Worried About Running Out of Food in the Last Year: Never true    Ran Out of Food in the Last Year: Never true  Transportation Needs: No Transportation Needs (05/26/2023)   PRAPARE - Administrator, Civil Service (Medical): No    Lack of Transportation (Non-Medical): No  Physical Activity: Sufficiently Active (05/26/2023)   Exercise Vital Sign    Days of Exercise per Week: 3 days    Minutes of Exercise per Session: 60 min  Stress: No Stress Concern Present (05/26/2023)   [employer] of Occupational Health - Occupational Stress Questionnaire    Feeling of Stress : Only a little  Social Connections: Socially Integrated (05/26/2023)   Social Connection and Isolation Panel [NHANES]    Frequency of Communication with Friends and Family: Three times a week    Frequency of Social Gatherings with Friends and Family: Three times a week    Attends Religious Services: More than 4 times per year    Active Member of Clubs or Organizations: Yes    Attends Banker Meetings: 1 to 4 times per year    Marital Status: Married  Catering manager Violence: Not on file    Review of Systems: See HPI, otherwise negative ROS  Physical Exam: BP (!) 156/89   Pulse 70   Temp (!)  97.3 F (36.3 C) (Temporal)   Resp 16   Ht 5\' 2"  (1.575 m)   Wt 76.5 kg   SpO2 98%   BMI 30.84 kg/m  General:   Alert, cooperative in NAD Head:  Normocephalic and atraumatic. Respiratory:  Normal work of breathing. Cardiovascular:  RRR  Impression/Plan: TEREZ MONTEE is here for cataract surgery.  Risks, benefits, limitations, and alternatives regarding cataract surgery have been reviewed with the patient.  Questions have been answered.  All parties agreeable.   Galen Manila, MD  06/21/2023, 12:04 PM

## 2023-06-22 ENCOUNTER — Encounter: Payer: Self-pay | Admitting: Ophthalmology

## 2023-06-27 DIAGNOSIS — H2512 Age-related nuclear cataract, left eye: Secondary | ICD-10-CM | POA: Diagnosis not present

## 2023-06-28 ENCOUNTER — Encounter: Payer: Self-pay | Admitting: Ophthalmology

## 2023-06-29 NOTE — Discharge Instructions (Signed)

## 2023-07-01 ENCOUNTER — Encounter: Payer: Self-pay | Admitting: Cardiology

## 2023-07-01 ENCOUNTER — Other Ambulatory Visit (HOSPITAL_COMMUNITY): Payer: Self-pay

## 2023-07-01 MED ORDER — DAPAGLIFLOZIN PROPANEDIOL 10 MG PO TABS: 10.0000 mg | ORAL_TABLET | Freq: Every day | ORAL | 3 refills | Status: DC

## 2023-07-05 ENCOUNTER — Other Ambulatory Visit: Payer: Self-pay

## 2023-07-05 ENCOUNTER — Encounter: Payer: Self-pay | Admitting: Ophthalmology

## 2023-07-05 ENCOUNTER — Ambulatory Visit
Admission: RE | Admit: 2023-07-05 | Discharge: 2023-07-05 | Disposition: A | Discharge status: Home / Self Care | Payer: PPO | Source: Ambulatory Visit | Attending: Ophthalmology | Admitting: Ophthalmology

## 2023-07-05 ENCOUNTER — Encounter
Admission: RE | Disposition: A | Discharge status: Home / Self Care | Payer: Self-pay | Source: Ambulatory Visit | Attending: Ophthalmology

## 2023-07-05 ENCOUNTER — Ambulatory Visit: Payer: PPO | Admitting: Anesthesiology

## 2023-07-05 DIAGNOSIS — I509 Heart failure, unspecified: Secondary | ICD-10-CM | POA: Diagnosis not present

## 2023-07-05 DIAGNOSIS — H2512 Age-related nuclear cataract, left eye: Secondary | ICD-10-CM | POA: Insufficient documentation

## 2023-07-05 DIAGNOSIS — I110 Hypertensive heart disease with heart failure: Secondary | ICD-10-CM | POA: Diagnosis not present

## 2023-07-05 DIAGNOSIS — I739 Peripheral vascular disease, unspecified: Secondary | ICD-10-CM | POA: Diagnosis not present

## 2023-07-05 DIAGNOSIS — I5032 Chronic diastolic (congestive) heart failure: Secondary | ICD-10-CM | POA: Diagnosis not present

## 2023-07-05 DIAGNOSIS — H2511 Age-related nuclear cataract, right eye: Secondary | ICD-10-CM | POA: Diagnosis not present

## 2023-07-05 DIAGNOSIS — Z87891 Personal history of nicotine dependence: Secondary | ICD-10-CM | POA: Insufficient documentation

## 2023-07-05 DIAGNOSIS — I2510 Atherosclerotic heart disease of native coronary artery without angina pectoris: Secondary | ICD-10-CM | POA: Insufficient documentation

## 2023-07-05 HISTORY — PX: CATARACT EXTRACTION W/PHACO: SHX586

## 2023-07-05 SURGERY — PHACOEMULSIFICATION, CATARACT, WITH IOL INSERTION
Anesthesia: Monitor Anesthesia Care | Laterality: Left

## 2023-07-05 MED ORDER — LACTATED RINGERS IV SOLN: INTRAVENOUS | Status: DC

## 2023-07-05 MED ORDER — SIGHTPATH DOSE#1 BSS IO SOLN
INTRAOCULAR | Status: DC | PRN
  Administered 2023-07-05: 1 mL

## 2023-07-05 MED ORDER — MIDAZOLAM HCL 2 MG/2ML IJ SOLN
INTRAMUSCULAR | Status: DC | PRN
  Administered 2023-07-05: 2 mg via INTRAVENOUS

## 2023-07-05 MED ORDER — TETRACAINE HCL 0.5 % OP SOLN
1.0000 [drp] | OPHTHALMIC | Status: DC | PRN
  Administered 2023-07-05 (×3): 1 [drp] via OPHTHALMIC

## 2023-07-05 MED ORDER — SIGHTPATH DOSE#1 BSS IO SOLN
INTRAOCULAR | Status: DC | PRN
  Administered 2023-07-05: 15 mL via INTRAOCULAR

## 2023-07-05 MED ORDER — SIGHTPATH DOSE#1 NA CHONDROIT SULF-NA HYALURON 40-17 MG/ML IO SOLN
INTRAOCULAR | Status: DC | PRN
  Administered 2023-07-05: 1 mL via INTRAOCULAR

## 2023-07-05 MED ORDER — FENTANYL CITRATE (PF) 100 MCG/2ML IJ SOLN
INTRAMUSCULAR | Status: DC | PRN
  Administered 2023-07-05 (×2): 25 ug via INTRAVENOUS

## 2023-07-05 MED ORDER — ARMC OPHTHALMIC DILATING DROPS
1.0000 | OPHTHALMIC | Status: DC | PRN
  Administered 2023-07-05 (×3): 1 via OPHTHALMIC

## 2023-07-05 MED ORDER — MOXIFLOXACIN HCL 0.5 % OP SOLN
OPHTHALMIC | Status: DC | PRN
  Administered 2023-07-05: .2 mL via OPHTHALMIC

## 2023-07-05 MED ORDER — BRIMONIDINE TARTRATE-TIMOLOL 0.2-0.5 % OP SOLN
OPHTHALMIC | Status: DC | PRN
  Administered 2023-07-05: 1 [drp] via OPHTHALMIC

## 2023-07-05 MED ORDER — SIGHTPATH DOSE#1 BSS IO SOLN
INTRAOCULAR | Status: DC | PRN
  Administered 2023-07-05: 60 mL via OPHTHALMIC

## 2023-07-05 SURGICAL SUPPLY — 17 items
ANGLE REVERSE CUT SHRT 25GA (CUTTER) ×1
CANNULA ANT/CHMB 27G (MISCELLANEOUS) IMPLANT
CANNULA ANT/CHMB 27GA (MISCELLANEOUS) IMPLANT
CATARACT SUITE SIGHTPATH (MISCELLANEOUS) ×1 IMPLANT
CYSTOTOME ANGL RVRS SHRT 25G (CUTTER) ×1 IMPLANT
CYSTOTOME ANGL RVRS SHRT 25GA (CUTTER) ×1 IMPLANT
FEE CATARACT SUITE SIGHTPATH (MISCELLANEOUS) ×1 IMPLANT
GLOVE BIOGEL PI IND STRL 8 (GLOVE) ×1 IMPLANT
GLOVE SURG ENC TEXT LTX SZ8 (GLOVE) ×1 IMPLANT
LENS IOL TECNIS EYHANCE 20.0 (Intraocular Lens) IMPLANT
NDL FILTER BLUNT 18X1 1/2 (NEEDLE) ×1 IMPLANT
NEEDLE FILTER BLUNT 18X1 1/2 (NEEDLE) ×1 IMPLANT
PACK VIT ANT 23G (MISCELLANEOUS) IMPLANT
RING MALYGIN (MISCELLANEOUS) IMPLANT
SUT ETHILON 10-0 CS-B-6CS-B-6 (SUTURE)
SUTURE EHLN 10-0 CS-B-6CS-B-6 (SUTURE) IMPLANT
SYR 3ML LL SCALE MARK (SYRINGE) ×1 IMPLANT

## 2023-07-05 NOTE — Op Note (Signed)
PREOPERATIVE DIAGNOSIS:  Nuclear sclerotic cataract of the left eye.   POSTOPERATIVE DIAGNOSIS:  Nuclear sclerotic cataract of the left eye.   OPERATIVE PROCEDURE:ORPROCALL@   SURGEON:  Galen Manila, MD.   ANESTHESIA:  Anesthesiologist: Reed Breech, MD CRNA: Bynum, [place], CRNA  1.      Managed anesthesia care. 2.     0.1ml of Shugarcaine was instilled following the paracentesis   COMPLICATIONS:  None.   TECHNIQUE:   Stop and chop   DESCRIPTION OF PROCEDURE:  The patient was examined and consented in the preoperative holding area where the aforementioned topical anesthesia was applied to the left eye and then brought back to the Operating Room where the left eye was prepped and draped in the usual sterile ophthalmic fashion and a lid speculum was placed. A paracentesis was created with the side port blade and the anterior chamber was filled with viscoelastic. A near clear corneal incision was performed with the steel keratome. A continuous curvilinear capsulorrhexis was performed with a cystotome followed by the capsulorrhexis forceps. Hydrodissection and hydrodelineation were carried out with BSS on a blunt cannula. The lens was removed in a stop and chop  technique and the remaining cortical material was removed with the irrigation-aspiration handpiece. The capsular bag was inflated with viscoelastic and the Technis [id number] lens was placed in the capsular bag without complication. The remaining viscoelastic was removed from the eye with the irrigation-aspiration handpiece. The wounds were hydrated. The anterior chamber was flushed with BSS and the eye was inflated to physiologic pressure. 0.65ml Vigamox was placed in the anterior chamber. The wounds were found to be water tight. The eye was dressed with Combigan. The patient was given protective glasses to wear throughout the day and a shield with which to sleep tonight. The patient was also given drops with which to begin a drop regimen  today and will follow-up with me in one day. Implant Name Type Inv. Item Serial No. Manufacturer Lot No. LRB No. Used Action  LENS IOL TECNIS EYHANCE 20.0 - [id number] Intraocular Lens LENS IOL TECNIS EYHANCE 20.0 [id number] S[employer]  Left 1 Implanted    Procedure(s): CATARACT EXTRACTION PHACO AND INTRAOCULAR LENS PLACEMENT (IOC) LEFT 14.23 1:08.3 (Left)  Electronically signed: Galen Manila 07/05/2023 9:17 AM

## 2023-07-05 NOTE — Anesthesia Postprocedure Evaluation (Signed)
Anesthesia Post Note  Patient: Valerie Curry  Procedure(s) Performed: CATARACT EXTRACTION PHACO AND INTRAOCULAR LENS PLACEMENT (IOC) LEFT 14.23 1:08.3 (Left)  Patient location during evaluation: PACU Anesthesia Type: MAC Level of consciousness: awake and alert, oriented and patient cooperative Pain management: pain level controlled Vital Signs Assessment: post-procedure vital signs reviewed and stable Respiratory status: spontaneous breathing, nonlabored ventilation and respiratory function stable Cardiovascular status: blood pressure returned to baseline and stable Postop Assessment: adequate PO intake Anesthetic complications: no   No notable events documented.   Last Vitals:  Vitals:   07/05/23 0919 07/05/23 0924  BP: 132/76 119/67  Pulse: 64 60  Resp: 15 20  Temp: (!) 35.7 C   SpO2: 97% 98%    Last Pain:  Vitals:   07/05/23 0919  TempSrc:   PainSc: 0-No pain                 Reed Breech

## 2023-07-05 NOTE — Anesthesia Preprocedure Evaluation (Signed)
Anesthesia Evaluation  Patient identified by MRN, date of birth, ID band Patient awake    Reviewed: Allergy & Precautions, NPO status , Patient's Chart, lab work & pertinent test results, reviewed documented beta blocker date and time   History of Anesthesia Complications (+) PONV and history of anesthetic complications  Airway Mallampati: III   Neck ROM: Full    Dental  (+) Missing   Pulmonary former smoker   Pulmonary exam normal breath sounds clear to auscultation       Cardiovascular hypertension, + CAD (nonobstructive), + Peripheral Vascular Disease and +CHF (NICM, EF 30-35%)  Normal cardiovascular exam Rhythm:Regular Rate:Normal     Neuro/Psych negative neurological ROS     GI/Hepatic negative GI ROS,,,  Endo/Other  Obesity   Renal/GU negative Renal ROS     Musculoskeletal  (+) Arthritis ,    Abdominal   Peds  Hematology negative hematology ROS (+)   Anesthesia Other Findings   Reproductive/Obstetrics                              Anesthesia Physical Anesthesia Plan  ASA: 3  Anesthesia Plan: MAC   Post-op Pain Management:    Induction: Intravenous  PONV Risk Score and Plan: 3 and Treatment may vary due to age or medical condition, Midazolam and TIVA  Airway Management Planned: Natural Airway and Nasal Cannula  Additional Equipment:   Intra-op Plan:   Post-operative Plan:   Informed Consent: I have reviewed the patients History and Physical, chart, labs and discussed the procedure including the risks, benefits and alternatives for the proposed anesthesia with the patient or authorized representative who has indicated his/her understanding and acceptance.     Dental advisory given  Plan Discussed with: CRNA  Anesthesia Plan Comments: (LMA/GETA backup discussed.  Patient consented for risks of anesthesia including but not limited to:  - adverse reactions to  medications - damage to eyes, teeth, lips or other oral mucosa - nerve damage due to positioning  - sore throat or hoarseness - damage to heart, brain, nerves, lungs, other parts of body or loss of life  Informed patient about role of CRNA in peri- and intra-operative care.  Patient voiced understanding.)        Anesthesia Quick Evaluation

## 2023-07-05 NOTE — H&P (Signed)
[facility]   Primary Care Physician:  Allegra Grana, FNP Ophthalmologist: Dr. Druscilla Brownie  Pre-Procedure History & Physical: HPI:  Valerie Curry is a 77 y.o. female here for cataract surgery.   Past Medical History:  Diagnosis Date   Arthritis    Car sickness    Complication of anesthesia    hard to wake up   Coronary artery disease 11/2018   calcification in LAD and RCA in screening lung scan    Family history of malignant neoplasm of breast    Maternal/paternal aunts and cousin   Grade I diastolic dysfunction    Heart failure with improved ejection fraction (HFimpEF) (HCC)    Hypertension    Left bundle branch block    Lump or mass in breast 2011/2012   benign   Mild aortic regurgitation    Mild mitral regurgitation by prior echocardiogram    Obesity, unspecified    Osteopenia    took Boniva 1 1/2 years in past-doesn't want to restart it   Personal history of tobacco use, presenting hazards to health    PONV (postoperative nausea and vomiting)    Positive PPD    as a child. xray was normal.   Systemic sclerosis [facility])     Past Surgical History:  Procedure Laterality Date   APPENDECTOMY  1961   BREAST BIOPSY Right 09/2010   benign   BREAST BIOPSY Left 09/2011   dilated ducts-benign   BUNIONECTOMY Bilateral 2010   x 2   CATARACT EXTRACTION W/PHACO Right 06/21/2023   Procedure: CATARACT EXTRACTION PHACO AND INTRAOCULAR LENS PLACEMENT (IOC) RIGHT 21.59 01:32.9;  Surgeon: Galen Manila, MD;  Location: [facility];  Service: Ophthalmology;  Laterality: Right;   CHOLECYSTECTOMY N/A 10/03/2019   Procedure: LAPAROSCOPIC CHOLECYSTECTOMY;  Surgeon: Duanne Guess, MD;  Location: [facility] ORS;  Service: General;  Laterality: N/A;   COLONOSCOPY  06/2004   Dr. Mechele Collin   COLONOSCOPY WITH PROPOFOL N/A 01/10/2019   Procedure: COLONOSCOPY WITH PROPOFOL;  Surgeon: Pasty Spillers, MD;  Location: [facility] END[facility];  Service: Endoscopy;  Laterality: N/A;    DECOMPRESSIVE LUMBAR LAMINECTOMY LEVEL 2 N/A 04/30/2015   Procedure: CENTRAL DECOMPRESSIVE LUMBER LAMINECTOMY WITH POSSIBLE FORAMINOTOMIES L3-L4,L4-L5;  Surgeon: Ranee Gosselin, MD;  Location: [facility];  Service: Orthopedics;  Laterality: N/A;   ERCP W/ SPHICTEROTOMY     ESOPHAGOGASTRODUODENOSCOPY (EGD) WITH PROPOFOL N/A 04/07/2021   Procedure: ESOPHAGOGASTRODUODENOSCOPY (EGD) WITH PROPOFOL;  Surgeon: Pasty Spillers, MD;  Location: [facility];  Service: Endoscopy;  Laterality: N/A;   LEFT HEART CATH AND CORONARY ANGIOGRAPHY Left 03/17/2022   Procedure: LEFT HEART CATH AND CORONARY ANGIOGRAPHY;  Surgeon: Yvonne Kendall, MD;  Location: [facility] INVASIVE CV LAB;  Service: Cardiovascular;  Laterality: Left;   POLYPECTOMY  04/07/2021   Procedure: POLYPECTOMY;  Surgeon: Pasty Spillers, MD;  Location: [facility];  Service: Endoscopy;;   TONSILLECTOMY  1968   TUBAL LIGATION  1974    Prior to Admission medications   Medication Sig Start Date End Date Taking? Authorizing Provider  acetaminophen (TYLENOL) 500 MG tablet Take 500-1,000 mg by mouth every 6 (six) hours as needed for moderate pain.   Yes [provider]  Calcium Citrate-Vitamin D (CALCIUM CITRATE+D3 PO) Take by mouth daily. 400/500   Yes [provider]  carvedilol (COREG) 3.125 MG tablet Take 1 tablet (3.125 mg total) by mouth 2 (two) times daily with a meal. 05/09/23  Yes Sabharwal, Aditya, DO  dapagliflozin propanediol (FARXIGA) 10 MG TABS tablet Take  1 tablet (10 mg total) by mouth daily before breakfast. 07/01/23  Yes Robbie Lis M, PA-C  gabapentin (NEURONTIN) 100 MG capsule Take 1 capsule by mouth 3 (three) times daily as needed. 10/01/22  Yes [provider]  losartan (COZAAR) 100 MG tablet Take 1 tablet (100 mg total) by mouth daily. 04/07/23 07/06/23 Yes Agbor-Etang, Arlys John, MD  meloxicam (MOBIC) 15 MG tablet Take 15 mg by mouth daily as needed for pain. 06/02/20  Yes [provider]  methocarbamol (ROBAXIN-750) 750 MG tablet Take 1 tablet (750 mg total) by mouth 3 (three) times daily as needed for muscle spasms. 01/19/23  Yes Arnett, Lyn Records, FNP  Multiple Vitamins-Minerals (PRESERVISION AREDS 2 PO) Take 1 tablet by mouth 2 (two) times daily.   Yes [provider]  psyllium (METAMUCIL) 58.6 % packet Take 1 packet by mouth daily at 6 (six) AM.   Yes [provider]  rosuvastatin (CRESTOR) 20 MG tablet TAKE 1 TABLET(20 MG) BY MOUTH EVERY EVENING 10/05/22  Yes Allegra Grana, FNP  spironolactone (ALDACTONE) 25 MG tablet Take 1 tablet (25 mg total) by mouth daily. 04/08/23 04/02/24 Yes Agbor-Etang, Arlys John, MD  traMADol (ULTRAM) 50 MG tablet Take 1 tablet (50 mg total) by mouth 2 (two) times daily as needed. 01/19/23  Yes Allegra Grana, FNP    Allergies as of 05/31/2023   (No Known Allergies)    Family History  Problem Relation Age of Onset   Lung cancer Father    Heart disease Father 25       Cardiac Arrest   Heart disease Mother    Hypertension Mother    Stroke Mother    Other Brother        blood clots   Other Other        hypercoag-family history   Cancer Maternal Aunt        Breast cancer   Breast cancer Maternal Aunt 64   Breast cancer Paternal Aunt 44   Breast cancer Cousin     Social History   Socioeconomic History   Marital status: Married    Spouse name: Not on file   Number of children: 2   Years of education: 15   Highest education level: Not on file  Occupational History   Occupation: Geophysical data processor: [employer]  Tobacco Use   Smoking status: Former    Current packs/day: 0.00    Average packs/day: 1 pack/day for 50.0 years (50.0 ttl pk-yrs)    Types: Cigarettes    Start date: 11/29/1964    Quit date: 11/29/2014    Years since quitting: 8.6   Smokeless tobacco: Former    Quit date: 04/30/2015  Vaping Use   Vaping status: Never Used  Substance and Sexual Activity   Alcohol  use: Yes    Comment: occasional glass of wine   Drug use: No   Sexual activity: Yes    Birth control/protection: Post-menopausal  Other Topics Concern   Not on file  Social History Narrative   Pt is 77 yo female. Pt was born in [place], moved to [place] when she was 51.  Pt is married to husband of 22 years.  Pt has 2 sons from a previous marriage and 4 step sons and 29 Grandchildren.        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        Pt works out at gym 3/week (2  days a week in a class, 1 day a week with an individual trainer).  Pt enjoys reading, spending time with her family. Pt and her husband are members of [employer] and active in their church.    Social Determinants of Health   Financial Resource Strain: Low Risk  (05/26/2023)   Overall Financial Resource Strain (CARDIA)    Difficulty of Paying Living Expenses: Not very hard  Food Insecurity: No Food Insecurity (05/26/2023)   Hunger Vital Sign    Worried About Running Out of Food in the Last Year: Never true    Ran Out of Food in the Last Year: Never true  Transportation Needs: No Transportation Needs (05/26/2023)   PRAPARE - Administrator, Civil Service (Medical): No    Lack of Transportation (Non-Medical): No  Physical Activity: Sufficiently Active (05/26/2023)   Exercise Vital Sign    Days of Exercise per Week: 3 days    Minutes of Exercise per Session: 60 min  Stress: No Stress Concern Present (05/26/2023)   [employer] of Occupational Health - Occupational Stress Questionnaire    Feeling of Stress : Only a little  Social Connections: Socially Integrated (05/26/2023)   Social Connection and Isolation Panel [NHANES]    Frequency of Communication with Friends and Family: Three times a week    Frequency of Social Gatherings with Friends and Family: Three times a week    Attends Religious Services: More than 4 times per year    Active Member of Clubs or  Organizations: Yes    Attends Banker Meetings: 1 to 4 times per year    Marital Status: Married  Catering manager Violence: Not on file    Review of Systems: See HPI, otherwise negative ROS  Physical Exam: BP (!) 145/75   Pulse 78   Temp (!) 97.3 F (36.3 C) (Temporal)   Resp 16   Ht 5\' 2"  (1.575 m)   Wt 75.3 kg   SpO2 100%   BMI 30.38 kg/m  General:   Alert, cooperative in NAD Head:  Normocephalic and atraumatic. Respiratory:  Normal work of breathing. Cardiovascular:  RRR  Impression/Plan: Valerie Curry is here for cataract surgery.  Risks, benefits, limitations, and alternatives regarding cataract surgery have been reviewed with the patient.  Questions have been answered.  All parties agreeable.   Galen Manila, MD  07/05/2023, 8:54 AM

## 2023-07-05 NOTE — Transfer of Care (Signed)
Immediate Anesthesia Transfer of Care Note  Patient: Valerie Curry  Procedure(s) Performed: CATARACT EXTRACTION PHACO AND INTRAOCULAR LENS PLACEMENT (IOC) LEFT 14.23 1:08.3 (Left)  Patient Location: PACU  Anesthesia Type: MAC  Level of Consciousness: awake, alert  and patient cooperative  Airway and Oxygen Therapy: Patient Spontanous Breathing and Patient connected to supplemental oxygen  Post-op Assessment: Post-op Vital signs reviewed, Patient's Cardiovascular Status Stable, Respiratory Function Stable, Patent Airway and No signs of Nausea or vomiting  Post-op Vital Signs: Reviewed and stable  Complications: No notable events documented.

## 2023-07-06 ENCOUNTER — Encounter: Payer: Self-pay | Admitting: Ophthalmology

## 2023-08-08 DIAGNOSIS — Z961 Presence of intraocular lens: Secondary | ICD-10-CM | POA: Diagnosis not present

## 2023-08-11 ENCOUNTER — Ambulatory Visit: Payer: PPO | Attending: Cardiology

## 2023-08-11 DIAGNOSIS — I5020 Unspecified systolic (congestive) heart failure: Secondary | ICD-10-CM | POA: Diagnosis not present

## 2023-08-11 LAB — ECHOCARDIOGRAM COMPLETE
AV Vena cont: 0.5 cm
Area-P 1/2: 3.65 cm2
Calc EF: 62.2 %
P 1/2 time: 706 ms
S' Lateral: 3.2 cm
Single Plane A2C EF: 65.7 %
Single Plane A4C EF: 59.6 %

## 2023-08-25 ENCOUNTER — Telehealth (HOSPITAL_COMMUNITY): Payer: Self-pay

## 2023-08-25 ENCOUNTER — Other Ambulatory Visit (HOSPITAL_COMMUNITY): Payer: Self-pay

## 2023-08-25 ENCOUNTER — Ambulatory Visit: Payer: PPO | Attending: Cardiology | Admitting: Cardiology

## 2023-08-25 VITALS — BP 144/76 | HR 63 | Wt 172.0 lb

## 2023-08-25 DIAGNOSIS — I5020 Unspecified systolic (congestive) heart failure: Secondary | ICD-10-CM | POA: Diagnosis not present

## 2023-08-25 DIAGNOSIS — I10 Essential (primary) hypertension: Secondary | ICD-10-CM

## 2023-08-25 DIAGNOSIS — I447 Left bundle-branch block, unspecified: Secondary | ICD-10-CM

## 2023-08-25 MED ORDER — LOSARTAN POTASSIUM 100 MG PO TABS: 150.0000 mg | ORAL_TABLET | Freq: Every day | ORAL | 3 refills | Status: DC

## 2023-08-25 MED ORDER — SACUBITRIL-VALSARTAN 49-51 MG PO TABS: 1.0000 | ORAL_TABLET | Freq: Two times a day (BID) | ORAL | 11 refills | Status: DC

## 2023-08-25 NOTE — Telephone Encounter (Signed)
Advanced Heart Failure Patient Advocate Encounter  The patient was approved for a [employer] grant that will help cover the cost of Carvedilol, Entresto, Farxiga, Losartan, Spironolactone.  Total amount awarded, $10,000.  Effective: 07/26/2023 - 07/24/2024.  BIN [id number] PCN [id number] Group [id number] ID [id number]  Pharmacy provided with approval and processing information. Patient informed via phone.  Burnell Blanks, CPhT Rx Patient Advocate Phone: [phone]

## 2023-08-25 NOTE — Progress Notes (Signed)
ADVANCED HEART FAILURE CLINIC NOTE  Referring Physician: Allegra Grana, FNP  Primary Care: Allegra Grana, FNP  HPI: Valerie Curry is a 77 y.o. female with heart failure with reduced ejection fraction secondary to nonischemic cardiomyopathy, left bundle branch block, nonobstructive CAD, history of tobacco use for greater than 40 years presenting today to establish care.She was diagnosed with LBBB 4-5 years ago when undergoing cholecystecomty leading to TTE showing reduced LVEF.  Today she presents for follow up.   From a fucntional standpoint she is doing very well. She partakes in exercise classes 3x weekly currently for an hour with very minimal symptoms. Reports that she is tolerating the addition of GDMT very well. NYHA II functional class.   Past Medical History:  Diagnosis Date   Arthritis    Car sickness    Complication of anesthesia    hard to wake up   Coronary artery disease 11/2018   calcification in LAD and RCA in screening lung scan    Family history of malignant neoplasm of breast    Maternal/paternal aunts and cousin   Grade I diastolic dysfunction    Heart failure with improved ejection fraction (HFimpEF) (HCC)    Hypertension    Left bundle branch block    Lump or mass in breast 2011/2012   benign   Mild aortic regurgitation    Mild mitral regurgitation by prior echocardiogram    Obesity, unspecified    Osteopenia    took Boniva 1 1/2 years in past-doesn't want to restart it   Personal history of tobacco use, presenting hazards to health    PONV (postoperative nausea and vomiting)    Positive PPD    as a child. xray was normal.   Systemic sclerosis (HCC)     Current Outpatient Medications  Medication Sig Dispense Refill   acetaminophen (TYLENOL) 500 MG tablet Take 500-1,000 mg by mouth every 6 (six) hours as needed for moderate pain.     Calcium Citrate-Vitamin D (CALCIUM CITRATE+D3 PO) Take by mouth daily. 400/500     carvedilol (COREG) 3.125  MG tablet Take 1 tablet (3.125 mg total) by mouth 2 (two) times daily with a meal. 90 tablet 3   dapagliflozin propanediol (FARXIGA) 10 MG TABS tablet Take 1 tablet (10 mg total) by mouth daily before breakfast. 100 tablet 3   gabapentin (NEURONTIN) 100 MG capsule Take 1 capsule by mouth 3 (three) times daily as needed.     meloxicam (MOBIC) 15 MG tablet Take 15 mg by mouth daily as needed for pain.     methocarbamol (ROBAXIN-750) 750 MG tablet Take 1 tablet (750 mg total) by mouth 3 (three) times daily as needed for muscle spasms. 30 tablet 1   Multiple Vitamins-Minerals (PRESERVISION AREDS 2 PO) Take 1 tablet by mouth 2 (two) times daily.     psyllium (METAMUCIL) 58.6 % packet Take 1 packet by mouth daily at 6 (six) AM.     rosuvastatin (CRESTOR) 20 MG tablet TAKE 1 TABLET(20 MG) BY MOUTH EVERY EVENING 90 tablet 3   spironolactone (ALDACTONE) 25 MG tablet Take 1 tablet (25 mg total) by mouth daily. 90 tablet 3   traMADol (ULTRAM) 50 MG tablet Take 1 tablet (50 mg total) by mouth 2 (two) times daily as needed. 30 tablet 1   losartan (COZAAR) 100 MG tablet Take 1 tablet (100 mg total) by mouth daily. 90 tablet 3   No current facility-administered medications for this visit.    No Known  Allergies    Social History   Socioeconomic History   Marital status: Married    Spouse name: Not on file   Number of children: 2   Years of education: 15   Highest education level: Not on file  Occupational History   Occupation: Geophysical data processor: [employer]  Tobacco Use   Smoking status: Former    Current packs/day: 0.00    Average packs/day: 1 pack/day for 50.0 years (50.0 ttl pk-yrs)    Types: Cigarettes    Start date: 11/29/1964    Quit date: 11/29/2014    Years since quitting: 8.7   Smokeless tobacco: Former    Quit date: 04/30/2015  Vaping Use   Vaping status: Never Used  Substance and Sexual Activity   Alcohol use: Yes    Comment: occasional glass of wine   Drug  use: No   Sexual activity: Yes    Birth control/protection: Post-menopausal  Other Topics Concern   Not on file  Social History Narrative   Pt is 77 yo female. Pt was born in [place], moved to [place] when she was 38.  Pt is married to husband of 22 years.  Pt has 2 sons from a previous marriage and 4 step sons and 63 Grandchildren.        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        Pt works out at gym 3/week (2 days a week in a class, 1 day a week with an Comptroller).  Pt enjoys reading, spending time with her family. Pt and her husband are members of [employer] and active in their church.    Social Determinants of Health   Financial Resource Strain: Low Risk  (05/26/2023)   Overall Financial Resource Strain (CARDIA)    Difficulty of Paying Living Expenses: Not very hard  Food Insecurity: No Food Insecurity (05/26/2023)   Hunger Vital Sign    Worried About Running Out of Food in the Last Year: Never true    Ran Out of Food in the Last Year: Never true  Transportation Needs: No Transportation Needs (05/26/2023)   PRAPARE - Administrator, Civil Service (Medical): No    Lack of Transportation (Non-Medical): No  Physical Activity: Sufficiently Active (05/26/2023)   Exercise Vital Sign    Days of Exercise per Week: 3 days    Minutes of Exercise per Session: 60 min  Stress: No Stress Concern Present (05/26/2023)   [employer] of Occupational Health - Occupational Stress Questionnaire    Feeling of Stress : Only a little  Social Connections: Socially Integrated (05/26/2023)   Social Connection and Isolation Panel [NHANES]    Frequency of Communication with Friends and Family: Three times a week    Frequency of Social Gatherings with Friends and Family: Three times a week    Attends Religious Services: More than 4 times per year    Active Member of Clubs or Organizations: Yes    Attends Banker  Meetings: 1 to 4 times per year    Marital Status: Married  Catering manager Violence: Not on file      Family History  Problem Relation Age of Onset   Lung cancer Father    Heart disease Father 67       Cardiac Arrest   Heart disease Mother    Hypertension Mother    Stroke Mother    Other Brother  blood clots   Other Other        hypercoag-family history   Cancer Maternal Aunt        Breast cancer   Breast cancer Maternal Aunt 50   Breast cancer Paternal Aunt 51   Breast cancer Cousin     PHYSICAL EXAM: Vitals:   08/25/23 1113  BP: (!) 144/76  Pulse: 63  SpO2: 100%   GENERAL: Well nourished, well developed, and in no apparent distress at rest.  HEENT: Negative for arcus senilis or xanthelasma. There is no scleral icterus.  The mucous membranes are pink and moist.   NECK: Supple, No masses. Normal carotid upstrokes without bruits. No masses or thyromegaly.    CHEST: There are no chest wall deformities. There is no chest wall tenderness. Respirations are unlabored.  Lungs-CTA bilaterally CARDIAC:  JVP: 5 cm H2O         Normal S1, S2  Normal rate with regular rhythm. No murmurs, rubs or gallops.  Pulses are 2+ and symmetrical in upper and lower extremities.  No edema.  ABDOMEN: Soft, non-tender, non-distended. There are no masses or hepatomegaly. There are normal bowel sounds.  EXTREMITIES: Warm and well perfused with no cyanosis, clubbing.  LYMPHATIC: No axillary or supraclavicular lymphadenopathy.  NEUROLOGIC: Patient is oriented x3 with no focal or lateralizing neurologic deficits.  PSYCH: Patients affect is appropriate, there is no evidence of anxiety or depression.  SKIN: Warm and dry; no lesions or wounds.   DATA REVIEW  ECG: NSR w/ LBBB, QRSd  As per my personal interpretation  ECHO: 03/31/23: LVEF 30-35%, RV function is normal  as per my personal interpretation  CATH: 03/17/22: Mild, non-obstructive coronary artery disease with up to 20% stenosis  involving the proximal LAD and mid RCA. Upper normal left ventricular filling pressure (LVEDP 15 mmHg).  CMR: 05/04/23 1. Normal LV size, moderately reduced LV function, LVEF 43%.  2. There is no late gadolinium enhancement in the left ventricular myocardium.  3. There is no evidence for myocardial infiltration.  4. Normal RV size and function.  5. Findings consistent with non ischemic cardiomyopathy.  ASSESSMENT & PLAN:  Heart failure reduced ejection fraction Etiology of HF: Nonischemic cardiomyopathy, likely secondary to underlying left bundle branch block.  Cardiac MRI with no LGE.  Left heart cath from April 2023 with minimal nonobstructive CAD. NYHA class / AHA Stage: NYHA II Volume status & Diuretics: Euvolemic Vasodilators: D/C losartan, start Entresto 49/51mg  BID.  Beta-Blocker: continue coreg 3.125mg  MRA: Spironolactone 25 mg daily Cardiometabolic:continue farxiga 10mg  daily Devices therapies & Valvulopathies: After lengthy discussion with her she currently has no heart failure symptoms and has had an improvement in EF on cardiac MRI.  We will uptitrate GDMT, repeat echocardiogram and if she has persistently reduced EF we will plan to refer to EP for BiV pacing.   Advanced therapies: Not currently indicated see above  2. Nonobstructive CAD -Crestor 20 mg daily  3. HTN -See above -Repeat labs today  Jelisha Weed Advanced Heart Failure Mechanical Circulatory Support

## 2023-08-25 NOTE — Patient Instructions (Signed)
Medication Changes:  Increase Losartan 150 mg (1.5 tablets) daily.  Lab Work:  Labs done today, your results will be available in MyChart, we will contact you for abnormal readings.    Special Instructions // Education:  Do the following things EVERYDAY: Weigh yourself in the morning before breakfast. Write it down and keep it in a log. Take your medicines as prescribed Eat low salt foods--Limit salt (sodium) to 2000 mg per day.  Stay as active as you can everyday Limit all fluids for the day to less than 2 liters   Follow-Up in: Please call in January to schedule your February appointment with Dr. Gasper Lloyd.     If you have any questions or concerns before your next appointment please send [place] a message through [facility] or call our office at [phone] Monday-Friday 8 am-5 pm.   If you have an urgent need after hours on the weekend please call your Primary Cardiologist or the Advanced Heart Failure Clinic in [place] at [phone].

## 2023-08-27 LAB — BASIC METABOLIC PANEL
BUN/Creatinine Ratio: 23 (ref 12–28)
BUN: 22 mg/dL (ref 8–27)
CO2: 24 mmol/L (ref 20–29)
Calcium: 9.7 mg/dL (ref 8.7–10.3)
Chloride: 104 mmol/L (ref 96–106)
Creatinine, Ser: 0.94 mg/dL (ref 0.57–1.00)
Glucose: 85 mg/dL (ref 70–99)
Potassium: 4.5 mmol/L (ref 3.5–5.2)
Sodium: 143 mmol/L (ref 134–144)
eGFR: 63 mL/min/{1.73_m2} (ref >59)

## 2023-08-27 LAB — BRAIN NATRIURETIC PEPTIDE: BNP: 99.5 pg/mL (ref 0.0–100.0)

## 2023-09-05 ENCOUNTER — Encounter: Payer: Self-pay | Admitting: Family

## 2023-09-05 ENCOUNTER — Other Ambulatory Visit: Payer: Self-pay | Admitting: Family

## 2023-09-05 DIAGNOSIS — D225 Melanocytic nevi of trunk: Secondary | ICD-10-CM | POA: Diagnosis not present

## 2023-09-05 DIAGNOSIS — D692 Other nonthrombocytopenic purpura: Secondary | ICD-10-CM | POA: Diagnosis not present

## 2023-09-05 DIAGNOSIS — L985 Mucinosis of the skin: Secondary | ICD-10-CM | POA: Diagnosis not present

## 2023-09-05 DIAGNOSIS — D2271 Melanocytic nevi of right lower limb, including hip: Secondary | ICD-10-CM | POA: Diagnosis not present

## 2023-09-05 DIAGNOSIS — G8929 Other chronic pain: Secondary | ICD-10-CM

## 2023-09-05 DIAGNOSIS — D2261 Melanocytic nevi of right upper limb, including shoulder: Secondary | ICD-10-CM | POA: Diagnosis not present

## 2023-09-05 DIAGNOSIS — D2272 Melanocytic nevi of left lower limb, including hip: Secondary | ICD-10-CM | POA: Diagnosis not present

## 2023-09-05 DIAGNOSIS — L821 Other seborrheic keratosis: Secondary | ICD-10-CM | POA: Diagnosis not present

## 2023-09-05 DIAGNOSIS — D485 Neoplasm of uncertain behavior of skin: Secondary | ICD-10-CM | POA: Diagnosis not present

## 2023-09-05 DIAGNOSIS — D2262 Melanocytic nevi of left upper limb, including shoulder: Secondary | ICD-10-CM | POA: Diagnosis not present

## 2023-09-06 ENCOUNTER — Other Ambulatory Visit: Payer: Self-pay | Admitting: Family

## 2023-09-06 DIAGNOSIS — T753XXA Motion sickness, initial encounter: Secondary | ICD-10-CM

## 2023-09-06 MED ORDER — SCOPOLAMINE 1 MG/3DAYS TD PT72SCOPOLAMINE 1 MG/3DAYS
1.0000 | MEDICATED_PATCH | TRANSDERMAL | 0 refills | Status: DC
Start: 2023-09-06 — End: 2023-10-11

## 2023-09-08 ENCOUNTER — Ambulatory Visit: Payer: PPO | Attending: Cardiology | Admitting: Cardiology

## 2023-09-08 ENCOUNTER — Encounter: Payer: Self-pay | Admitting: Cardiology

## 2023-09-08 VITALS — BP 130/76 | HR 63 | Ht 62.0 in | Wt 168.8 lb

## 2023-09-08 DIAGNOSIS — I428 Other cardiomyopathies: Secondary | ICD-10-CM | POA: Diagnosis not present

## 2023-09-08 DIAGNOSIS — I10 Essential (primary) hypertension: Secondary | ICD-10-CM | POA: Diagnosis not present

## 2023-09-08 DIAGNOSIS — I2510 Atherosclerotic heart disease of native coronary artery without angina pectoris: Secondary | ICD-10-CM | POA: Diagnosis not present

## 2023-09-08 NOTE — Patient Instructions (Signed)
Medication Instructions:   Your physician recommends that you continue on your current medications as directed. Please refer to the Current Medication list given to you today.  *If you need a refill on your cardiac medications before your next appointment, please call your pharmacy*   Lab Work:  None Ordered  If you have labs (blood work) drawn today and your tests are completely normal, you will receive your results only by: MyChart Message (if you have MyChart) OR A paper copy in the mail If you have any lab test that is abnormal or we need to change your treatment, we will call you to review the results.   Testing/Procedures:  Your physician has requested that you have an Limited Echocardiogram in 5 months. Echocardiography is a painless test that uses sound waves to create images of your heart. It provides your doctor with information about the size and shape of your heart and how well your heart's chambers and valves are working. This procedure takes approximately one hour. There are no restrictions for this procedure. Please do NOT wear cologne, perfume, aftershave, or lotions (deodorant is allowed). Please arrive 15 minutes prior to your appointment time.    Follow-Up: At [facility], you and your health needs are our priority.  As part of our continuing mission to provide you with exceptional heart care, we have created designated Provider Care Teams.  These Care Teams include your primary Cardiologist (physician) and Advanced Practice Providers (APPs -  Physician Assistants and Nurse Practitioners) who all work together to provide you with the care you need, when you need it.  We recommend signing up for the patient portal called "MyChart".  Sign up information is provided on this After Visit Summary.  MyChart is used to connect with patients for Virtual Visits (Telemedicine).  Patients are able to view lab/test results, encounter notes, upcoming appointments, etc.   Non-urgent messages can be sent to your provider as well.   To learn more about what you can do with MyChart, go to [email or web address].    Your next appointment:   1 year(s)  Provider:   You may see Debbe Odea, MD or one of the following Advanced Practice Providers on your designated Care Team:   Nicolasa Ducking, NP Eula Listen, PA-C Cadence Fransico Michael, PA-C Charlsie Quest, NP

## 2023-09-08 NOTE — Progress Notes (Signed)
Cardiology Office Note:    Date:  09/08/2023   ID:  Valerie Curry, [place] [birth year], MRN [id number]  PCP:  Allegra Grana, FNP  Cardiologist:  Debbe Odea, MD  Electrophysiologist:  None   Referring MD: Allegra Grana, FNP   Chief Complaint  Patient presents with   Follow-up    Discuss cardiac testing results.  Patient denies new or acute cardiac problems/concerns today.      History of Present Illness:    Valerie Curry is a 77 y.o. female with a hx of hypertension, NICM initial EF 30 to 35%, last EF 35-40%, nonobstructive CAD (LHC 02/2022-20% RCA and LAD) , former smoker x40+ years who presents follow-up.   She feels well, denies chest pain or shortness of breath.  Evaluated at heart failure clinic last month, started on Entresto 49-51 mg twice daily.  She is tolerating medications with no adverse effect.  Feels well, no concerns at this time.   Prior notes CMR 6/24 EF 43%, no LGE. Echo 03/2023 EF 30 to 35% Left heart cath 02/2022 20% RCA and LAD stenosis Echo 02/2022 EF 30 to 35% Did not tolerate Entresto due to itching CT chest lung cancer screening 11/2018 showed calcified plaque in LAD and RCA. 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   Past Medical History:  Diagnosis Date   Arthritis    Car sickness    Complication of anesthesia    hard to wake up   Coronary artery disease 11/2018   calcification in LAD and RCA in screening lung scan    Family history of malignant neoplasm of breast    Maternal/paternal aunts and cousin   Grade I diastolic dysfunction    Heart failure with improved ejection fraction (HFimpEF) (HCC)    Hypertension    Left bundle branch block    Lump or mass in breast 2011/2012   benign   Mild aortic regurgitation    Mild mitral regurgitation by prior echocardiogram    Obesity, unspecified    Osteopenia    took Boniva 1 1/2 years in past-doesn't want to restart it   Personal history of tobacco use, presenting  hazards to health    PONV (postoperative nausea and vomiting)    Positive PPD    as a child. xray was normal.   Systemic sclerosis [facility])     Past Surgical History:  Procedure Laterality Date   APPENDECTOMY  1961   BREAST BIOPSY Right 09/2010   benign   BREAST BIOPSY Left 09/2011   dilated ducts-benign   BUNIONECTOMY Bilateral 2010   x 2   CATARACT EXTRACTION W/PHACO Right 06/21/2023   Procedure: CATARACT EXTRACTION PHACO AND INTRAOCULAR LENS PLACEMENT (IOC) RIGHT 21.59 01:32.9;  Surgeon: Galen Manila, MD;  Location: [facility];  Service: Ophthalmology;  Laterality: Right;   CATARACT EXTRACTION W/PHACO Left 07/05/2023   Procedure: CATARACT EXTRACTION PHACO AND INTRAOCULAR LENS PLACEMENT (IOC) LEFT 14.23 1:08.3;  Surgeon: Galen Manila, MD;  Location: [facility];  Service: Ophthalmology;  Laterality: Left;   CHOLECYSTECTOMY N/A 10/03/2019   Procedure: LAPAROSCOPIC CHOLECYSTECTOMY;  Surgeon: Duanne Guess, MD;  Location: [facility] ORS;  Service: General;  Laterality: N/A;   COLONOSCOPY  06/2004   Dr. Mechele Collin   COLONOSCOPY WITH PROPOFOL N/A 01/10/2019   Procedure: COLONOSCOPY WITH PROPOFOL;  Surgeon: Pasty Spillers, MD;  Location: [facility];  Service: Endoscopy;  Laterality: N/A;   DECOMPRESSIVE LUMBAR LAMINECTOMY LEVEL 2 N/A 04/30/2015   Procedure: CENTRAL DECOMPRESSIVE LUMBER LAMINECTOMY  WITH POSSIBLE FORAMINOTOMIES L3-L4,L4-L5;  Surgeon: Ranee Gosselin, MD;  Location: [facility];  Service: Orthopedics;  Laterality: N/A;   ERCP W/ SPHICTEROTOMY     ESOPHAGOGASTRODUODENOSCOPY (EGD) WITH PROPOFOL N/A 04/07/2021   Procedure: ESOPHAGOGASTRODUODENOSCOPY (EGD) WITH PROPOFOL;  Surgeon: Pasty Spillers, MD;  Location: [facility];  Service: Endoscopy;  Laterality: N/A;   LEFT HEART CATH AND CORONARY ANGIOGRAPHY Left 03/17/2022   Procedure: LEFT HEART CATH AND CORONARY ANGIOGRAPHY;  Surgeon: Yvonne Kendall, MD;  Location: [facility] INVASIVE CV LAB;  Service:  Cardiovascular;  Laterality: Left;   POLYPECTOMY  04/07/2021   Procedure: POLYPECTOMY;  Surgeon: Pasty Spillers, MD;  Location: [facility];  Service: Endoscopy;;   TONSILLECTOMY  1968   TUBAL LIGATION  1974    Current Medications: Current Meds  Medication Sig   acetaminophen (TYLENOL) 500 MG tablet Take 500-1,000 mg by mouth every 6 (six) hours as needed for moderate pain.   Calcium Citrate-Vitamin D (CALCIUM CITRATE+D3 PO) Take by mouth daily. 400/500   carvedilol (COREG) 3.125 MG tablet Take 1 tablet (3.125 mg total) by mouth 2 (two) times daily with a meal.   dapagliflozin propanediol (FARXIGA) 10 MG TABS tablet Take 1 tablet (10 mg total) by mouth daily before breakfast.   gabapentin (NEURONTIN) 100 MG capsule Take 1 capsule by mouth 3 (three) times daily as needed.   meloxicam (MOBIC) 15 MG tablet Take 15 mg by mouth daily as needed for pain.   methocarbamol (ROBAXIN-750) 750 MG tablet Take 1 tablet (750 mg total) by mouth 3 (three) times daily as needed for muscle spasms.   Multiple Vitamins-Minerals (PRESERVISION AREDS 2 PO) Take 1 tablet by mouth 2 (two) times daily.   psyllium (METAMUCIL) 58.6 % packet Take 1 packet by mouth daily at 6 (six) AM.   rosuvastatin (CRESTOR) 20 MG tablet TAKE 1 TABLET(20 MG) BY MOUTH EVERY EVENING   sacubitril-valsartan (ENTRESTO) 49-51 MG Take 1 tablet by mouth 2 (two) times daily.   scopolamine (TRANSDERM-SCOP) 1 MG/3DAYS Place 1 patch (1.5 mg total) onto the skin every 3 (three) days.   spironolactone (ALDACTONE) 25 MG tablet Take 1 tablet (25 mg total) by mouth daily.   traMADol (ULTRAM) 50 MG tablet TAKE ONE TABLET BY MOUTH TWICE DAILY AS NEEDED     Allergies:   Patient has no known allergies.   Social History   Socioeconomic History   Marital status: Married    Spouse name: Not on file   Number of children: 2   Years of education: 15   Highest education level: Not on file  Occupational History   Occupation: Diplomatic Services operational officer: [employer]  Tobacco Use   Smoking status: Former    Current packs/day: 0.00    Average packs/day: 1 pack/day for 50.0 years (50.0 ttl pk-yrs)    Types: Cigarettes    Start date: 11/29/1964    Quit date: 11/29/2014    Years since quitting: 8.7   Smokeless tobacco: Former    Quit date: 04/30/2015  Vaping Use   Vaping status: Never Used  Substance and Sexual Activity   Alcohol use: Yes    Comment: occasional glass of wine   Drug use: No   Sexual activity: Yes    Birth control/protection: Post-menopausal  Other Topics Concern   Not on file  Social History Narrative   Pt is 77 yo female. Pt was born in [place], moved to [place] when she was 59.  Pt is married to  husband of 22 years.  Pt has 2 sons from a previous marriage and 4 step sons and 53 Grandchildren.        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        Pt works out at gym 3/week (2 days a week in a class, 1 day a week with an Comptroller).  Pt enjoys reading, spending time with her family. Pt and her husband are members of [employer] and active in their church.    Social Determinants of Health   Financial Resource Strain: Low Risk  (05/26/2023)   Overall Financial Resource Strain (CARDIA)    Difficulty of Paying Living Expenses: Not very hard  Food Insecurity: No Food Insecurity (05/26/2023)   Hunger Vital Sign    Worried About Running Out of Food in the Last Year: Never true    Ran Out of Food in the Last Year: Never true  Transportation Needs: No Transportation Needs (05/26/2023)   PRAPARE - Administrator, Civil Service (Medical): No    Lack of Transportation (Non-Medical): No  Physical Activity: Sufficiently Active (05/26/2023)   Exercise Vital Sign    Days of Exercise per Week: 3 days    Minutes of Exercise per Session: 60 min  Stress: No Stress Concern Present (05/26/2023)   [employer] of Occupational Health -  Occupational Stress Questionnaire    Feeling of Stress : Only a little  Social Connections: Socially Integrated (05/26/2023)   Social Connection and Isolation Panel [NHANES]    Frequency of Communication with Friends and Family: Three times a week    Frequency of Social Gatherings with Friends and Family: Three times a week    Attends Religious Services: More than 4 times per year    Active Member of Clubs or Organizations: Yes    Attends Banker Meetings: 1 to 4 times per year    Marital Status: Married     Family History: The patient's family history includes Breast cancer in her cousin; Breast cancer (age of onset: 44) in her maternal aunt and paternal aunt; Cancer in her maternal aunt; Heart disease in her mother; Heart disease (age of onset: 67) in her father; Hypertension in her mother; Lung cancer in her father; Other in her brother and another family member; Stroke in her mother.  ROS:   Please see the history of present illness.     All other systems reviewed and are negative.  EKGs/Labs/Other Studies Reviewed:    The following studies were reviewed today:  Recent Labs: 04/07/2023: Hemoglobin 13.2 08/25/2023: BNP 99.5; BUN 22; Creatinine, Ser 0.94; Potassium 4.5; Sodium 143  Recent Lipid Panel    Component Value Date/Time   CHOL 129 02/03/2023 0837   CHOL 149 10/01/2021 0823   TRIG 58.0 02/03/2023 0837   HDL 70.80 02/03/2023 0837   HDL 67 10/01/2021 0823   CHOLHDL 2 02/03/2023 0837   VLDL 11.6 02/03/2023 0837   LDLCALC 46 02/03/2023 0837   LDLCALC 70 10/01/2021 0823   LDLDIRECT 100.2 07/30/2008 1017    Physical Exam:    VS:  BP 130/76 (BP Location: Left Arm, Patient Position: Sitting, Cuff Size: Large)   Pulse 63   Ht 5\' 2"  (1.575 m)   Wt 168 lb 12.8 oz (76.6 kg)   SpO2 96%   BMI 30.87 kg/m     Wt Readings from Last 3 Encounters:  09/08/23 168 lb 12.8 oz (76.6 kg)  08/25/23  172 lb (78 kg)  07/05/23 166 lb 1.6 oz (75.3 kg)     GEN:  Well  nourished, well developed in no acute distress HEENT: Normal NECK: No JVD; No carotid bruits CARDIAC: RRR, no murmurs, rubs, gallops RESPIRATORY:  Clear to auscultation without rales, wheezing or rhonchi  ABDOMEN: Soft, non-tender, non-distended MUSCULOSKELETAL:  No edema; No deformity  SKIN: Warm and dry NEUROLOGIC:  Alert and oriented x 3 PSYCHIATRIC:  Normal affect   ASSESSMENT:    1. Coronary artery disease involving native coronary artery of native heart without angina pectoris   2. NICM (nonischemic cardiomyopathy) (HCC)   3. Primary hypertension    PLAN:    In order of problems listed above:  Nonobstructive CAD, (LHC 02/2022- 20% proximal LAD and mid RCA ).  Continue aspirin, Crestor.  LDL at goal.   Nonischemic cardiomyopathy, EF 30 to 35%.  Echo 08/11/2023 EF 35 to 40%.  CMR 04/2023 EF 43%.    Appears euvolemic.  NYHA class II symptoms.  Etiology possibly left bundle branch block cardiomyopathy, continue Coreg 3.125 mg twice daily, Entresto 49-51 mg twice daily, Aldactone 25 mg daily, Farxiga.  Medications actively being adjusted by heart failure team.  Follow-up appointment scheduled in about 3 months. Hypertension, BP Controlled.  Continue carvedilol, Entresto, Aldactone.  Follow-up in 12  months   Medication Adjustments/Labs and Tests Ordered: Current medicines are reviewed at length with the patient today.  Concerns regarding medicines are outlined above.  Orders Placed This Encounter  Procedures   ECHOCARDIOGRAM LIMITED   No orders of the defined types were placed in this encounter.   Patient Instructions  Medication Instructions:   Your physician recommends that you continue on your current medications as directed. Please refer to the Current Medication list given to you today.  *If you need a refill on your cardiac medications before your next appointment, please call your pharmacy*   Lab Work:  None Ordered  If you have labs (blood work) drawn today and  your tests are completely normal, you will receive your results only by: MyChart Message (if you have MyChart) OR A paper copy in the mail If you have any lab test that is abnormal or we need to change your treatment, we will call you to review the results.   Testing/Procedures:  Your physician has requested that you have an Limited Echocardiogram in 5 months. Echocardiography is a painless test that uses sound waves to create images of your heart. It provides your doctor with information about the size and shape of your heart and how well your heart's chambers and valves are working. This procedure takes approximately one hour. There are no restrictions for this procedure. Please do NOT wear cologne, perfume, aftershave, or lotions (deodorant is allowed). Please arrive 15 minutes prior to your appointment time.    Follow-Up: At [facility], you and your health needs are our priority.  As part of our continuing mission to provide you with exceptional heart care, we have created designated Provider Care Teams.  These Care Teams include your primary Cardiologist (physician) and Advanced Practice Providers (APPs -  Physician Assistants and Nurse Practitioners) who all work together to provide you with the care you need, when you need it.  We recommend signing up for the patient portal called "MyChart".  Sign up information is provided on this After Visit Summary.  MyChart is used to connect with patients for Virtual Visits (Telemedicine).  Patients are able to view lab/test results, encounter  notes, upcoming appointments, etc.  Non-urgent messages can be sent to your provider as well.   To learn more about what you can do with MyChart, go to [email or web address].    Your next appointment:   1 year(s)  Provider:   You may see Debbe Odea, MD or one of the following Advanced Practice Providers on your designated Care Team:   Nicolasa Ducking, NP Eula Listen, PA-C [place]ence  Fransico Michael, PA-C Charlsie Quest, NP   Signed, Debbe Odea, MD  09/08/2023 12:20 PM    [facility]

## 2023-09-09 DIAGNOSIS — M7072 Other bursitis of hip, left hip: Secondary | ICD-10-CM | POA: Diagnosis not present

## 2023-10-10 ENCOUNTER — Encounter: Payer: Self-pay | Admitting: Family

## 2023-10-10 NOTE — Telephone Encounter (Signed)
Spoke to Valerie Curry scheduled her to see Birdie Sons on 10/11/23

## 2023-10-11 ENCOUNTER — Encounter: Payer: Self-pay | Admitting: Family Medicine

## 2023-10-11 ENCOUNTER — Other Ambulatory Visit: Payer: Self-pay | Admitting: Family

## 2023-10-11 ENCOUNTER — Ambulatory Visit (INDEPENDENT_AMBULATORY_CARE_PROVIDER_SITE_OTHER): Payer: PPO | Admitting: Family Medicine

## 2023-10-11 VITALS — BP 118/78 | HR 78 | Temp 98.4°F | Ht 62.0 in | Wt 171.2 lb

## 2023-10-11 DIAGNOSIS — R350 Frequency of micturition: Secondary | ICD-10-CM | POA: Insufficient documentation

## 2023-10-11 DIAGNOSIS — I2510 Atherosclerotic heart disease of native coronary artery without angina pectoris: Secondary | ICD-10-CM

## 2023-10-11 LAB — URINALYSIS, MICROSCOPIC ONLY

## 2023-10-11 LAB — POC URINALSYSI DIPSTICK (AUTOMATED)
Bilirubin, UA: NEGATIVE
Glucose, UA: POSITIVE — AB
Ketones, UA: NEGATIVE
Leukocytes, UA: NEGATIVE
Nitrite, UA: NEGATIVE
Protein, UA: NEGATIVE
Spec Grav, UA: <=1.005 — AB (ref 1.010–1.025)
Urobilinogen, UA: 0.2 U/dL
pH, UA: 6.5 (ref 5.0–8.0)

## 2023-10-11 MED ORDER — NITROFURANTOIN MONOHYD MACRO 100 MG PO CAPS: 100.0000 mg | ORAL_CAPSULE | Freq: Two times a day (BID) | ORAL | 0 refills | Status: DC

## 2023-10-11 NOTE — Progress Notes (Signed)
Valerie Alar, MD Phone: [phone]  Valerie Curry is a 77 y.o. female who presents today for same day visit.   Dysuria: Patient notes onset of symptoms 2 days ago.  She has some dysuria as well as difficulty emptying her bladder.  Some urinary frequency and urgency.  No hematuria.  She does have suprapubic abdominal discomfort that is mild.  No fevers.  She does have a history of UTI.  Social History   Tobacco Use  Smoking Status Former   Current packs/day: 0.00   Average packs/day: 1 pack/day for 50.0 years (50.0 ttl pk-yrs)   Types: Cigarettes   Start date: 11/29/1964   Quit date: 11/29/2014   Years since quitting: 8.8  Smokeless Tobacco Former   Quit date: 04/30/2015    Current Outpatient Medications on File Prior to Visit  Medication Sig Dispense Refill   acetaminophen (TYLENOL) 500 MG tablet Take 500-1,000 mg by mouth every 6 (six) hours as needed for moderate pain.     Calcium Citrate-Vitamin D (CALCIUM CITRATE+D3 PO) Take by mouth daily. 400/500     carvedilol (COREG) 3.125 MG tablet Take 1 tablet (3.125 mg total) by mouth 2 (two) times daily with a meal. 90 tablet 3   dapagliflozin propanediol (FARXIGA) 10 MG TABS tablet Take 1 tablet (10 mg total) by mouth daily before breakfast. 100 tablet 3   gabapentin (NEURONTIN) 100 MG capsule Take 1 capsule by mouth 3 (three) times daily as needed.     meloxicam (MOBIC) 15 MG tablet Take 15 mg by mouth daily as needed for pain.     methocarbamol (ROBAXIN-750) 750 MG tablet Take 1 tablet (750 mg total) by mouth 3 (three) times daily as needed for muscle spasms. 30 tablet 1   Multiple Vitamins-Minerals (PRESERVISION AREDS 2 PO) Take 1 tablet by mouth 2 (two) times daily.     psyllium (METAMUCIL) 58.6 % packet Take 1 packet by mouth daily at 6 (six) AM.     rosuvastatin (CRESTOR) 20 MG tablet TAKE 1 TABLET(20 MG) BY MOUTH EVERY EVENING 90 tablet 3   sacubitril-valsartan (ENTRESTO) 49-51 MG Take 1 tablet by mouth 2 (two) times daily. 60  tablet 11   spironolactone (ALDACTONE) 25 MG tablet Take 1 tablet (25 mg total) by mouth daily. 90 tablet 3   traMADol (ULTRAM) 50 MG tablet TAKE ONE TABLET BY MOUTH TWICE DAILY AS NEEDED 30 tablet 1   No current facility-administered medications on file prior to visit.     ROS see history of present illness  Objective  Physical Exam Vitals:   10/11/23 1122  BP: 118/78  Pulse: 78  Temp: 98.4 F (36.9 C)  SpO2: 98%    BP Readings from Last 3 Encounters:  10/11/23 118/78  09/08/23 130/76  08/25/23 (!) 144/76   Wt Readings from Last 3 Encounters:  10/11/23 171 lb 3.2 oz (77.7 kg)  09/08/23 168 lb 12.8 oz (76.6 kg)  08/25/23 172 lb (78 kg)    Physical Exam Abdominal:     General: Bowel sounds are normal. There is no distension.     Palpations: Abdomen is soft.     Tenderness: There is abdominal tenderness (Slight suprapubic discomfort on palpation). There is no guarding.      Assessment/Plan: Please see individual problem list.  Urine frequency Assessment & Plan: Symptoms concerning for UTI.  Will proceed with empiric treatment with Macrobid 100 mg twice daily for 7 days.  Will send urine for culture and microscopy.  Advised on risk  of diarrhea with the antibiotic.  If she has excessive diarrhea she will let us know.  Discussed she should start to improve in the next day or 2 with use of the antibiotic.  Orders: -     Urine Culture -     Urine Microscopic -     POCT Urinalysis Dipstick (Automated) -     Nitrofurantoin Monohyd Macro; Take 1 capsule (100 mg total) by mouth 2 (two) times daily.  Dispense: 14 capsule; Refill: 0    Return if symptoms worsen or fail to improve.   Valerie Alar, MD [facility]

## 2023-10-11 NOTE — Assessment & Plan Note (Signed)
Symptoms concerning for UTI.  Will proceed with empiric treatment with Macrobid 100 mg twice daily for 7 days.  Will send urine for culture and microscopy.  Advised on risk of diarrhea with the antibiotic.  If she has excessive diarrhea she will let us know.  Discussed she should start to improve in the next day or 2 with use of the antibiotic.

## 2023-10-13 LAB — URINE CULTURE
MICRO NUMBER:: 15719017
Result:: NO GROWTH
SPECIMEN QUALITY:: ADEQUATE

## 2023-10-25 ENCOUNTER — Encounter: Payer: Self-pay | Admitting: Obstetrics and Gynecology

## 2023-10-25 ENCOUNTER — Ambulatory Visit: Payer: PPO | Admitting: Obstetrics and Gynecology

## 2023-10-25 VITALS — BP 129/79 | HR 64 | Ht 62.0 in | Wt 172.5 lb

## 2023-10-25 DIAGNOSIS — N813 Complete uterovaginal prolapse: Secondary | ICD-10-CM | POA: Diagnosis not present

## 2023-10-25 DIAGNOSIS — N819 Female genital prolapse, unspecified: Secondary | ICD-10-CM

## 2023-10-25 DIAGNOSIS — Z7689 Persons encountering health services in other specified circumstances: Secondary | ICD-10-CM

## 2023-10-25 MED ORDER — ESTRADIOL 0.1 MG/GM VA CREA: 0.2500 | TOPICAL_CREAM | VAGINAL | 0 refills | Status: AC

## 2023-10-25 NOTE — Progress Notes (Addendum)
HPI:      Ms. Valerie Curry is a 77 y.o. G2P2 who LMP was No LMP recorded. Patient is postmenopausal.  Subjective:   She presents today stating that since her visit 4 years ago with me her prolapse has become significantly worse.  She reports that something about the size of a baseball is protruding from the vagina.  She has gotten to where this is too annoying for her and she does not want to live with it any further.  She does complain of occasional urine loss and wears a pad regularly.  She and her husband are not having intercourse at this time because of her prolapse and because he has difficulty with erections.  She does not plan to have intercourse in the future.  She continues to have occasional diarrhea but not constipation. She is requesting definitive management of her pelvic prolapse at this time.    Hx: The following portions of the patient's history were reviewed and updated as appropriate:             She  has a past medical history of Arthritis, Car sickness, Complication of anesthesia, Coronary artery disease (11/2018), Family history of malignant neoplasm of breast, Grade I diastolic dysfunction, Heart failure with improved ejection fraction (HFimpEF) (HCC), Hypertension, Left bundle branch block, Lump or mass in breast (2011/2012), Mild aortic regurgitation, Mild mitral regurgitation by prior echocardiogram, Obesity, unspecified, Osteopenia, Personal history of tobacco use, presenting hazards to health, PONV (postoperative nausea and vomiting), Positive PPD, and Systemic sclerosis (HCC). She does not have any pertinent problems on file. She  has a past surgical history that includes Tonsillectomy (1968); Colonoscopy (06/2004); Bunionectomy (Bilateral, 2010); Appendectomy (1961); Tubal ligation (1974); Decompressive lumbar laminectomy level 2 (N/A, 04/30/2015); Colonoscopy with propofol (N/A, 01/10/2019); Cholecystectomy (N/A, 10/03/2019); Breast biopsy (Right, 09/2010); Breast biopsy  (Left, 09/2011); Esophagogastroduodenoscopy (egd) with propofol (N/A, 04/07/2021); polypectomy (04/07/2021); ERCP w/ sphicterotomy; LEFT HEART CATH AND CORONARY ANGIOGRAPHY (Left, 03/17/2022); Cataract extraction w/PHACO (Right, 06/21/2023); and Cataract extraction w/PHACO (Left, 07/05/2023). Her family history includes Breast cancer in her cousin; Breast cancer (age of onset: 84) in her maternal aunt and paternal aunt; Cancer in her maternal aunt; Heart disease in her mother; Heart disease (age of onset: 59) in her father; Hypertension in her mother; Lung cancer in her father; Other in her brother and another family member; Stroke in her mother. She  reports that she quit smoking about 8 years ago. Her smoking use included cigarettes. She started smoking about 58 years ago. She has a 50 pack-year smoking history. She quit smokeless tobacco use about 8 years ago. She reports current alcohol use. She reports that she does not use drugs. She has a current medication list which includes the following prescription(s): acetaminophen, calcium citrate-vitamin d, carvedilol, dapagliflozin propanediol, [START ON 10/27/2023] estradiol, gabapentin, meloxicam, methocarbamol, multiple vitamins-minerals, psyllium, rosuvastatin, sacubitril-valsartan, spironolactone, and tramadol. She has No Known Allergies.       Review of Systems:  Review of Systems  Constitutional: Denied constitutional symptoms, night sweats, recent illness, fatigue, fever, insomnia and weight loss.  Eyes: Denied eye symptoms, eye pain, photophobia, vision change and visual disturbance.  Ears/Nose/Throat/Neck: Denied ear, nose, throat or neck symptoms, hearing loss, nasal discharge, sinus congestion and sore throat.  Cardiovascular: Denied cardiovascular symptoms, arrhythmia, chest pain/pressure, edema, exercise intolerance, orthopnea and palpitations.  Respiratory: Denied pulmonary symptoms, asthma, pleuritic pain, productive sputum, cough, dyspnea  and wheezing.  Gastrointestinal: Denied, gastro-esophageal reflux, melena, nausea and vomiting.  Genitourinary: See  HPI for additional information.  Musculoskeletal: Denied musculoskeletal symptoms, stiffness, swelling, muscle weakness and myalgia.  Dermatologic: Denied dermatology symptoms, rash and scar.  Neurologic: Denied neurology symptoms, dizziness, headache, neck pain and syncope.  Psychiatric: Denied psychiatric symptoms, anxiety and depression.  Endocrine: Denied endocrine symptoms including hot flashes and night sweats.   Meds:   Current Outpatient Medications on File Prior to Visit  Medication Sig Dispense Refill   acetaminophen (TYLENOL) 500 MG tablet Take 500-1,000 mg by mouth every 6 (six) hours as needed for moderate pain.     Calcium Citrate-Vitamin D (CALCIUM CITRATE+D3 PO) Take by mouth daily. 400/500     carvedilol (COREG) 3.125 MG tablet Take 1 tablet (3.125 mg total) by mouth 2 (two) times daily with a meal. 90 tablet 3   dapagliflozin propanediol (FARXIGA) 10 MG TABS tablet Take 1 tablet (10 mg total) by mouth daily before breakfast. 100 tablet 3   gabapentin (NEURONTIN) 100 MG capsule Take 1 capsule by mouth 3 (three) times daily as needed.     meloxicam (MOBIC) 15 MG tablet Take 15 mg by mouth daily as needed for pain.     methocarbamol (ROBAXIN-750) 750 MG tablet Take 1 tablet (750 mg total) by mouth 3 (three) times daily as needed for muscle spasms. 30 tablet 1   Multiple Vitamins-Minerals (PRESERVISION AREDS 2 PO) Take 1 tablet by mouth 2 (two) times daily.     psyllium (METAMUCIL) 58.6 % packet Take 1 packet by mouth daily at 6 (six) AM.     rosuvastatin (CRESTOR) 20 MG tablet TAKE 1 TABLET BY MOUTH EVERY EVENING 90 tablet 3   sacubitril-valsartan (ENTRESTO) 49-51 MG Take 1 tablet by mouth 2 (two) times daily. 60 tablet 11   spironolactone (ALDACTONE) 25 MG tablet Take 1 tablet (25 mg total) by mouth daily. 90 tablet 3   traMADol (ULTRAM) 50 MG tablet TAKE ONE  TABLET BY MOUTH TWICE DAILY AS NEEDED 30 tablet 1   No current facility-administered medications on file prior to visit.      Objective:     Vitals:   10/25/23 1017  BP: 129/79  Pulse: 64   Filed Weights   10/25/23 1017  Weight: 172 lb 8 oz (78.2 kg)              Physical examination   Pelvic:   Vulva: Normal appearance.  No lesions.  Vagina: No lesions or abnormalities noted.  Support: Uterine procidentia with cystocele and rectocele.  Large mass protruding from the vagina approximate the size of a softball.  Urethra No masses tenderness or scarring.  Meatus Normal size without lesions or prolapse.  Cervix: Normal appearance.  No lesions.  Anus: Normal exam.  No lesions.  Perineum: Normal exam.  No lesions.        Bimanual   Uterus: Normal size.  Non-tender.  Mobile.  AV.  Adnexae: No masses.  Non-tender to palpation.  Cul-de-sac: Negative for abnormality.             Assessment:    G2P2 Patient Active Problem List   Diagnosis Date Noted   Urine frequency 10/11/2023   HFrEF (heart failure with reduced ejection fraction) (HCC) 03/17/2022   Hiatal hernia    Acute gastric erosion    Thyroid nodule 12/30/2020   Abdominal pain 06/27/2020   Abnormal LFTs 06/27/2020   Bursitis of hip 04/17/2020   Epigastric pain 12/28/2019   Pain in thumb joint with movement, right 11/13/2019   Osteoarthritis of knee 11/13/2019  Symptomatic cholelithiasis    Umbilical hernia without obstruction and without gangrene    Right bundle branch block (RBBB) with incomplete left bundle branch block (LBBB) 09/28/2019   Calculus of gallbladder without cholecystitis without obstruction 09/20/2019   Elevated liver enzymes 09/20/2019   Chronic diarrhea    Benign neoplasm of descending colon    Atherosclerosis of aorta (HCC) 12/13/2018   Coronary artery disease 11/2018   HLD (hyperlipidemia) 11/17/2018   Uterine prolapse 11/17/2018   Diarrhea 11/17/2018   Family history of clotting  disorder 11/15/2017   Postmenopausal estrogen deficiency 10/06/2015   Spinal stenosis, lumbar region, with neurogenic claudication 04/30/2015   Low back pain 03/25/2015   Screening for breast cancer 09/13/2014   Obesity (BMI 30-39.9) 09/13/2014   HTN (hypertension) 08/31/2013   Osteoporosis 06/27/2007     1. Establishing care with new doctor, encounter for   2. Female genital prolapse, unspecified type   3. Uterine procidentia        Plan:            1.  We have discussed multiple options.  Patient declined pessary.  She desires surgical management.  I have discussed with her possibility of apical vagina fixation at [facility] or [facility] and she has declined this option.  We discussed the possibility of making the vagina very small or even performing a LeFort procedure.  I am currently leaning toward LeFort procedure in order to try to prevent future occurrences of prolapse.  Patient understands that intercourse are no longer be possible with the surgery.  We have also discussed the necessity of urethral sling procedure.  2.  Use of vaginal estrogen cream to prepare the vagina for surgery  3.  Present for preop in January.  4.  Because of a recent history of decreased ejection fraction-have suggested cardiac clearance prior to surgery.  Patient will work on obtaining this.  Orders No orders of the defined types were placed in this encounter.    Meds ordered this encounter  Medications   estradiol (ESTRACE) 0.1 MG/GM vaginal cream    Sig: Place 0.25 Applicatorfuls vaginally 3 (three) times a week.    Dispense:  90 g    Refill:  0      F/U  Return in about 6 weeks (around 12/06/2023). I spent 35 minutes involved in the care of this patient preparing to see the patient by obtaining and reviewing her medical history (including labs, imaging tests and prior procedures), documenting clinical information in the electronic health record (EHR), counseling and coordinating care plans, writing and  sending prescriptions, ordering tests or procedures and in direct communicating with the patient and medical staff discussing pertinent items from her history and physical exam.  Elonda Husky, M.D. 10/25/2023 10:55 AM

## 2023-10-25 NOTE — Progress Notes (Signed)
Patient presents today to discuss pelvic prolapse. She states this has been an ongoing issue for the past 4+ years. At this time she would like to discuss surgical options.

## 2023-11-07 ENCOUNTER — Other Ambulatory Visit: Payer: Self-pay | Admitting: Cardiology

## 2023-11-10 ENCOUNTER — Encounter: Payer: Self-pay | Admitting: Cardiology

## 2023-11-18 ENCOUNTER — Other Ambulatory Visit
Admission: RE | Admit: 2023-11-18 | Discharge: 2023-11-18 | Disposition: A | Discharge status: Home / Self Care | Payer: PPO | Source: Ambulatory Visit | Attending: Cardiology | Admitting: Cardiology

## 2023-11-18 ENCOUNTER — Encounter: Payer: Self-pay | Admitting: Cardiology

## 2023-11-18 ENCOUNTER — Ambulatory Visit: Payer: PPO | Admitting: Cardiology

## 2023-11-18 VITALS — BP 128/75 | HR 68 | Wt 176.0 lb

## 2023-11-18 DIAGNOSIS — I5020 Unspecified systolic (congestive) heart failure: Secondary | ICD-10-CM | POA: Diagnosis not present

## 2023-11-18 LAB — COMPREHENSIVE METABOLIC PANEL
ALT: 15 U/L (ref 0–44)
AST: 20 U/L (ref 15–41)
Albumin: 3.8 g/dL (ref 3.5–5.0)
Alkaline Phosphatase: 54 U/L (ref 38–126)
Anion gap: 7 (ref 5–15)
BUN: 20 mg/dL (ref 8–23)
CO2: 27 mmol/L (ref 22–32)
Calcium: 9 mg/dL (ref 8.9–10.3)
Chloride: 105 mmol/L (ref 98–111)
Creatinine, Ser: 0.75 mg/dL (ref 0.44–1.00)
GFR, Estimated: >60 mL/min (ref >60)
Glucose, Bld: 90 mg/dL (ref 70–99)
Potassium: 4 mmol/L (ref 3.5–5.1)
Sodium: 139 mmol/L (ref 135–145)
Total Bilirubin: 0.7 mg/dL (ref <1.2)
Total Protein: 6.2 g/dL — ABNORMAL LOW (ref 6.5–8.1)

## 2023-11-18 LAB — BRAIN NATRIURETIC PEPTIDE: B Natriuretic Peptide: 134.1 pg/mL — ABNORMAL HIGH (ref 0.0–100.0)

## 2023-11-18 MED ORDER — FUROSEMIDE 20 MG PO TABS: 20.0000 mg | ORAL_TABLET | Freq: Every day | ORAL | 3 refills | Status: DC | PRN

## 2023-11-18 NOTE — Patient Instructions (Signed)
Medication Changes:  START: LASIX (FUROSEMIDE) 20MG  ONCE DAILY ONLY AS NEEDED  PLEASE TAKE 1 DOSE TOMORROW   Lab Work:  Go over to the [facility]. Go pass the gift shop and have your blood work completed.  We will only call you if the results are abnormal or if the provider would like to make medication changes.   Follow-Up in: 2 months as scheduled    If you have any questions or concerns before your next appointment please send [place] a message through [place] or call our office at [phone] Monday-Friday 8 am-5 pm.   If you have an urgent need after hours on the weekend please call your Primary Cardiologist or the [facility] in [place] at [phone].   At the [facility], you and your health needs are our priority. We have a designated team specialized in the treatment of Heart Failure. This Care Team includes your primary Heart Failure Specialized Cardiologist (physician), Advanced Practice Providers (APPs- Physician Assistants and Nurse Practitioners), and Pharmacist who all work together to provide you with the care you need, when you need it.   You may see any of the following providers on your designated Care Team at your next follow up:  Dr. Arvilla Meres Dr. Marca Ancona Dr. Dorthula Nettles Dr. Theresia Bough Tonye Becket, NP Robbie Lis, [place] [address] [place], [place] Brynda Peon, NP [place] Lee, NP Clarisa Kindred, NP Enos Fling, PharmD

## 2023-11-18 NOTE — Progress Notes (Signed)
[facility] NOTE  Referring Physician: Allegra Grana, FNP  Primary Care: Allegra Grana, FNP  HPI: Valerie Curry is a 77 y.o. female with heart failure with reduced ejection fraction secondary to nonischemic cardiomyopathy, left bundle branch block, nonobstructive CAD, history of tobacco use for greater than 40 years presenting today to establish care.She was diagnosed with LBBB 4-5 years ago when undergoing cholecystecomty leading to TTE showing reduced LVEF.  Today she presents for follow up.   Continuing to do very well from a functional standpoint.  Compliant with all GDMT.  Reports that she feels better than she did previously after starting low-dose medications.  At this time does not wish to undergo any invasive procedures including CRT-D/BiV pacemaker unless there is a drop in LVEF and symptoms.  Past Medical History:  Diagnosis Date   Arthritis    Car sickness    Complication of anesthesia    hard to wake up   Coronary artery disease 11/2018   calcification in LAD and RCA in screening lung scan    Family history of malignant neoplasm of breast    Maternal/paternal aunts and cousin   Grade I diastolic dysfunction    Heart failure with improved ejection fraction (HFimpEF) (HCC)    Hypertension    Left bundle branch block    Lump or mass in breast 2011/2012   benign   Mild aortic regurgitation    Mild mitral regurgitation by prior echocardiogram    Obesity, unspecified    Osteopenia    took Boniva 1 1/2 years in past-doesn't want to restart it   Personal history of tobacco use, presenting hazards to health    PONV (postoperative nausea and vomiting)    Positive PPD    as a child. xray was normal.   Systemic sclerosis (HCC)     Current Outpatient Medications  Medication Sig Dispense Refill   acetaminophen (TYLENOL) 500 MG tablet Take 500-1,000 mg by mouth every 6 (six) hours as needed for moderate pain.     Calcium Citrate-Vitamin D  (CALCIUM CITRATE+D3 PO) Take by mouth daily. 400/500     carvedilol (COREG) 3.125 MG tablet TAKE 1 TABLET BY MOUTH 2 TIMES DAILY WITH A MEAL. 90 tablet 3   dapagliflozin propanediol (FARXIGA) 10 MG TABS tablet Take 1 tablet (10 mg total) by mouth daily before breakfast. 100 tablet 3   estradiol (ESTRACE) 0.1 MG/GM vaginal cream Place 0.25 Applicatorfuls vaginally 3 (three) times a week. 90 g 0   gabapentin (NEURONTIN) 100 MG capsule Take 1 capsule by mouth 3 (three) times daily as needed.     meloxicam (MOBIC) 15 MG tablet Take 15 mg by mouth daily as needed for pain.     methocarbamol (ROBAXIN-750) 750 MG tablet Take 1 tablet (750 mg total) by mouth 3 (three) times daily as needed for muscle spasms. 30 tablet 1   Multiple Vitamins-Minerals (PRESERVISION AREDS 2 PO) Take 1 tablet by mouth 2 (two) times daily.     psyllium (METAMUCIL) 58.6 % packet Take 1 packet by mouth daily at 6 (six) AM.     rosuvastatin (CRESTOR) 20 MG tablet TAKE 1 TABLET BY MOUTH EVERY EVENING 90 tablet 3   sacubitril-valsartan (ENTRESTO) 49-51 MG Take 1 tablet by mouth 2 (two) times daily. 60 tablet 11   spironolactone (ALDACTONE) 25 MG tablet Take 1 tablet (25 mg total) by mouth daily. 90 tablet 3   traMADol (ULTRAM) 50 MG tablet TAKE ONE TABLET BY MOUTH  TWICE DAILY AS NEEDED 30 tablet 1   No current facility-administered medications for this visit.    No Known Allergies    Social History   Socioeconomic History   Marital status: Married    Spouse name: Not on file   Number of children: 2   Years of education: 15   Highest education level: Some college, no degree  Occupational History   Occupation: Geophysical data processor: [employer]  Tobacco Use   Smoking status: Former    Current packs/day: 0.00    Average packs/day: 1 pack/day for 50.0 years (50.0 ttl pk-yrs)    Types: Cigarettes    Start date: 11/29/1964    Quit date: 11/29/2014    Years since quitting: 8.9   Smokeless tobacco: Former     Quit date: 04/30/2015  Vaping Use   Vaping status: Never Used  Substance and Sexual Activity   Alcohol use: Yes    Comment: occasional glass of wine   Drug use: No   Sexual activity: Yes    Birth control/protection: Post-menopausal  Other Topics Concern   Not on file  Social History Narrative   Pt is 77 yo female. Pt was born in [place], moved to [place] when she was 38.  Pt is married to husband of 22 years.  Pt has 2 sons from a previous marriage and 4 step sons and 24 Grandchildren.        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        Pt works out at gym 3/week (2 days a week in a class, 1 day a week with an Comptroller).  Pt enjoys reading, spending time with her family. Pt and her husband are members of [employer] and active in their church.    Social Drivers of Corporate investment banker Strain: Low Risk  (10/10/2023)   Overall Financial Resource Strain (CARDIA)    Difficulty of Paying Living Expenses: Not hard at all  Food Insecurity: No Food Insecurity (10/10/2023)   Hunger Vital Sign    Worried About Running Out of Food in the Last Year: Never true    Ran Out of Food in the Last Year: Never true  Transportation Needs: No Transportation Needs (10/10/2023)   PRAPARE - Administrator, Civil Service (Medical): No    Lack of Transportation (Non-Medical): No  Physical Activity: Sufficiently Active (10/10/2023)   Exercise Vital Sign    Days of Exercise per Week: 3 days    Minutes of Exercise per Session: 60 min  Stress: No Stress Concern Present (10/10/2023)   [employer] of Occupational Health - Occupational Stress Questionnaire    Feeling of Stress : Only a little  Social Connections: Socially Integrated (10/10/2023)   Social Connection and Isolation Panel [NHANES]    Frequency of Communication with Friends and Family: More than three times a week    Frequency of Social Gatherings with Friends  and Family: Three times a week    Attends Religious Services: More than 4 times per year    Active Member of Clubs or Organizations: Yes    Attends Engineer, structural: More than 4 times per year    Marital Status: Married  Catering manager Violence: Not on file      Family History  Problem Relation Age of Onset   Lung cancer Father    Heart disease Father 13  Cardiac Arrest   Heart disease Mother    Hypertension Mother    Stroke Mother    Other Brother        blood clots   Other Other        hypercoag-family history   Cancer Maternal Aunt        Breast cancer   Breast cancer Maternal Aunt 64   Breast cancer Paternal Aunt 39   Breast cancer Cousin     PHYSICAL EXAM: Vitals:   11/18/23 1137  BP: 128/75  Pulse: 68  SpO2: 100%   GENERAL: Well nourished, well developed, and in no apparent distress at rest.  HEENT: Negative for arcus senilis or xanthelasma. There is no scleral icterus.  The mucous membranes are pink and moist.   NECK: Supple, No masses. Normal carotid upstrokes without bruits. No masses or thyromegaly.    CHEST: There are no chest wall deformities. There is no chest wall tenderness. Respirations are unlabored.  Lungs- CTA B/L CARDIAC:  JVP: 6 cm          Normal rate with regular rhythm. No murmurs, rubs or gallops.  Pulses are 2+ and symmetrical in upper and lower extremities. No edema.  ABDOMEN: Soft, non-tender, non-distended. There are no masses or hepatomegaly. There are normal bowel sounds.  EXTREMITIES: Warm and well perfused with no cyanosis, clubbing.  LYMPHATIC: No axillary or supraclavicular lymphadenopathy.  NEUROLOGIC: Patient is oriented x3 with no focal or lateralizing neurologic deficits.  PSYCH: Patients affect is appropriate, there is no evidence of anxiety or depression.  SKIN: Warm and dry; no lesions or wounds.    DATA REVIEW  ECG: NSR w/ LBBB, QRSd  As per my personal interpretation  ECHO: 03/31/23: LVEF  30-35%, RV function is normal  as per my personal interpretation  CATH: 03/17/22: Mild, non-obstructive coronary artery disease with up to 20% stenosis involving the proximal LAD and mid RCA. Upper normal left ventricular filling pressure (LVEDP 15 mmHg).  CMR: 05/04/23 1. Normal LV size, moderately reduced LV function, LVEF 43%.  2. There is no late gadolinium enhancement in the left ventricular myocardium.  3. There is no evidence for myocardial infiltration.  4. Normal RV size and function.  5. Findings consistent with non ischemic cardiomyopathy.  ASSESSMENT & PLAN:  Heart failure reduced ejection fraction Etiology of HF: Nonischemic cardiomyopathy, likely secondary to underlying left bundle branch block.  Cardiac MRI with no LGE.  Left heart cath from April 2023 with minimal nonobstructive CAD. NYHA class / AHA Stage: NYHA II Volume status & Diuretics: Mildly hypervolemic on exam.  Take Lasix 20 mg tomorrow and then as needed only Vasodilators: Continue Entresto 49/51mg  BID.  Beta-Blocker: continue coreg 3.125mg  MRA: Spironolactone 25 mg daily Cardiometabolic:continue farxiga 10mg  daily Devices therapies & Valvulopathies: After lengthy discussion with her she currently has no heart failure symptoms and has had an improvement in EF on cardiac MRI.  We will continue to monitor her LVEF by echocardiogram.  No indication for BiV device placement at this time; she is also not interested in any invasive procedures unless she becomes symptomatic;  She has NYHA II symptoms. Advanced therapies: Not currently indicated see above  2. Nonobstructive CAD -Crestor 20 mg daily  3. HTN -Well-controlled.  See above.  Daniyla Pfahler Advanced [facility] Failure Mechanical Circulatory Support

## 2023-12-06 ENCOUNTER — Telehealth: Payer: Self-pay | Admitting: Cardiology

## 2023-12-13 ENCOUNTER — Other Ambulatory Visit: Payer: Self-pay

## 2023-12-13 ENCOUNTER — Encounter: Payer: Self-pay | Admitting: Obstetrics and Gynecology

## 2023-12-13 DIAGNOSIS — N813 Complete uterovaginal prolapse: Secondary | ICD-10-CM

## 2023-12-13 DIAGNOSIS — N819 Female genital prolapse, unspecified: Secondary | ICD-10-CM

## 2023-12-13 MED ORDER — ESTRADIOL 0.1 MG/GM VA CREA: TOPICAL_CREAM | VAGINAL | 1 refills | Status: DC

## 2023-12-14 ENCOUNTER — Encounter: Payer: Self-pay | Admitting: Obstetrics and Gynecology

## 2023-12-27 ENCOUNTER — Encounter: Payer: Self-pay | Admitting: Family Medicine

## 2023-12-27 ENCOUNTER — Ambulatory Visit (INDEPENDENT_AMBULATORY_CARE_PROVIDER_SITE_OTHER): Payer: PPO | Admitting: Family Medicine

## 2023-12-27 VITALS — BP 120/74 | HR 73 | Temp 98.0°F | Resp 18 | Ht 62.0 in | Wt 173.2 lb

## 2023-12-27 DIAGNOSIS — R052 Subacute cough: Secondary | ICD-10-CM | POA: Diagnosis not present

## 2023-12-27 DIAGNOSIS — R059 Cough, unspecified: Secondary | ICD-10-CM | POA: Insufficient documentation

## 2023-12-27 MED ORDER — FLUTICASONE PROPIONATE 50 MCG/ACT NA SUSP: 2.0000 | Freq: Every day | NASAL | 1 refills | Status: DC

## 2023-12-27 MED ORDER — LORATADINE 10 MG PO TABS: 10.0000 mg | ORAL_TABLET | Freq: Every day | ORAL | 1 refills | Status: DC

## 2023-12-27 NOTE — Progress Notes (Signed)
Marikay Alar, MD Phone: [phone]  Valerie Curry is a 78 y.o. female who presents today for same day visit.   Cough: Patient notes she had a cold at the end of December.  Since then she has had some persistent cough and rhinorrhea.  She sounds hoarse at times.  That she feels better overall though the cough has seemed to stabilize.  She feels as though there is something stuck in her throat with this.  No congestion.  No fevers.  No postnasal drip.  No sore throat.  No reflux.  No shortness of breath.  She has been doing Robitussin cough drops and Robitussin DM at night.  Social History   Tobacco Use  Smoking Status Former   Current packs/day: 0.00   Average packs/day: 1 pack/day for 50.0 years (50.0 ttl pk-yrs)   Types: Cigarettes   Start date: 11/29/1964   Quit date: 11/29/2014   Years since quitting: 9.0  Smokeless Tobacco Former   Quit date: 04/30/2015    Current Outpatient Medications on File Prior to Visit  Medication Sig Dispense Refill   acetaminophen (TYLENOL) 500 MG tablet Take 500-1,000 mg by mouth every 6 (six) hours as needed for moderate pain.     Calcium Citrate-Vitamin D (CALCIUM CITRATE+D3 PO) Take by mouth daily. 400/500     carvedilol (COREG) 3.125 MG tablet TAKE 1 TABLET BY MOUTH 2 TIMES DAILY WITH A MEAL. 90 tablet 3   dapagliflozin propanediol (FARXIGA) 10 MG TABS tablet Take 1 tablet (10 mg total) by mouth daily before breakfast. 100 tablet 3   estradiol (ESTRACE VAGINAL) 0.1 MG/GM vaginal cream Place 0.25 Applicatorfuls vaginally 3 (three) times a week. 42.5 g 1   furosemide (LASIX) 20 MG tablet Take 1 tablet (20 mg total) by mouth daily as needed for edema or fluid. 30 tablet 3   gabapentin (NEURONTIN) 100 MG capsule Take 1 capsule by mouth 3 (three) times daily as needed.     meloxicam (MOBIC) 15 MG tablet Take 15 mg by mouth daily as needed for pain.     methocarbamol (ROBAXIN-750) 750 MG tablet Take 1 tablet (750 mg total) by mouth 3 (three) times daily  as needed for muscle spasms. 30 tablet 1   Multiple Vitamins-Minerals (PRESERVISION AREDS 2 PO) Take 1 tablet by mouth 2 (two) times daily.     psyllium (METAMUCIL) 58.6 % packet Take 1 packet by mouth daily at 6 (six) AM.     rosuvastatin (CRESTOR) 20 MG tablet TAKE 1 TABLET BY MOUTH EVERY EVENING 90 tablet 3   sacubitril-valsartan (ENTRESTO) 49-51 MG Take 1 tablet by mouth 2 (two) times daily. 60 tablet 11   spironolactone (ALDACTONE) 25 MG tablet Take 1 tablet (25 mg total) by mouth daily. 90 tablet 3   traMADol (ULTRAM) 50 MG tablet TAKE ONE TABLET BY MOUTH TWICE DAILY AS NEEDED 30 tablet 1   No current facility-administered medications on file prior to visit.     ROS see history of present illness  Objective  Physical Exam Vitals:   12/27/23 0931  BP: 120/74  Pulse: 73  Resp: 18  Temp: 98 F (36.7 C)  SpO2: 98%    BP Readings from Last 3 Encounters:  12/27/23 120/74  11/18/23 128/75  10/25/23 129/79   Wt Readings from Last 3 Encounters:  12/27/23 173 lb 4 oz (78.6 kg)  11/18/23 176 lb (79.8 kg)  10/25/23 172 lb 8 oz (78.2 kg)    Physical Exam Constitutional:  General: She is not in acute distress.    Appearance: She is not diaphoretic.  HENT:     Mouth/Throat:     Mouth: Mucous membranes are moist.     Pharynx: Oropharynx is clear.  Cardiovascular:     Rate and Rhythm: Normal rate and regular rhythm.     Heart sounds: Normal heart sounds.  Pulmonary:     Effort: Pulmonary effort is normal.     Breath sounds: Normal breath sounds.  Skin:    General: Skin is warm and dry.  Neurological:     Mental Status: She is alert.      Assessment/Plan: Please see individual problem list.  Subacute cough Assessment & Plan: Suspect patient either has some degree of allergies or she is having some residual inflammation from her prior infection.  Will trial Flonase 2 sprays each nostril daily and Claritin 10 mg once daily and see if this provides any benefit.   If it is not beneficial within 1 to 2 weeks she will let us know and we will order chest x-ray.  Orders: -     Fluticasone Propionate; Place 2 sprays into both nostrils daily.  Dispense: 16 g; Refill: 1 -     Loratadine; Take 1 tablet (10 mg total) by mouth daily.  Dispense: 30 tablet; Refill: 1    Return if symptoms worsen or fail to improve.   Marikay Alar, MD [facility]

## 2023-12-27 NOTE — Assessment & Plan Note (Signed)
Suspect patient either has some degree of allergies or she is having some residual inflammation from her prior infection.  Will trial Flonase 2 sprays each nostril daily and Claritin 10 mg once daily and see if this provides any benefit.  If it is not beneficial within 1 to 2 weeks she will let us know and we will order chest x-ray.

## 2023-12-29 ENCOUNTER — Encounter: Payer: Self-pay | Admitting: Cardiology

## 2023-12-29 ENCOUNTER — Encounter: Payer: Self-pay | Admitting: Obstetrics and Gynecology

## 2023-12-29 ENCOUNTER — Ambulatory Visit: Payer: PPO | Admitting: Obstetrics and Gynecology

## 2023-12-29 VITALS — BP 101/70 | HR 130 | Ht 62.0 in | Wt 176.9 lb

## 2023-12-29 DIAGNOSIS — N813 Complete uterovaginal prolapse: Secondary | ICD-10-CM | POA: Diagnosis not present

## 2023-12-29 DIAGNOSIS — Z01818 Encounter for other preprocedural examination: Secondary | ICD-10-CM

## 2023-12-29 DIAGNOSIS — N819 Female genital prolapse, unspecified: Secondary | ICD-10-CM | POA: Diagnosis not present

## 2023-12-29 MED ORDER — METRONIDAZOLE 500 MG PO TABS: 500.0000 mg | ORAL_TABLET | Freq: Two times a day (BID) | ORAL | 0 refills | Status: DC

## 2023-12-29 NOTE — Progress Notes (Signed)
Patient presents today for a pre-op exam prior to a Lefort procedure for prolapse. She states no additional concerns today.

## 2023-12-29 NOTE — H&P (View-Only) (Signed)
 PRE-OPERATIVE HISTORY AND PHYSICAL EXAM  PCP:  Allegra Grana, FNP Subjective:   HPI:  Valerie Curry is a 78 y.o. G2P2.  No LMP recorded. Patient is postmenopausal.  She presents today for a pre-op discussion and PE.  She has the following symptoms: Pelvic organ prolapse, stress urinary incontinence   She states that she is not currently sexually active and has no plans for sex in the future.  Would like the repair is small and tight as possible.  Review of Systems:   Constitutional: Denied constitutional symptoms, night sweats, recent illness, fatigue, fever, insomnia and weight loss.  Eyes: Denied eye symptoms, eye pain, photophobia, vision change and visual disturbance.  Ears/Nose/Throat/Neck: Denied ear, nose, throat or neck symptoms, hearing loss, nasal discharge, sinus congestion and sore throat.  Cardiovascular: Denied cardiovascular symptoms, arrhythmia, chest pain/pressure, edema, exercise intolerance, orthopnea and palpitations.  Respiratory: Denied pulmonary symptoms, asthma, pleuritic pain, productive sputum, cough, dyspnea and wheezing.  Gastrointestinal: Denied, gastro-esophageal reflux, melena, nausea and vomiting.  Genitourinary: See HPI for additional information.  Musculoskeletal: Denied musculoskeletal symptoms, stiffness, swelling, muscle weakness and myalgia.  Dermatologic: Denied dermatology symptoms, rash and scar.  Neurologic: Denied neurology symptoms, dizziness, headache, neck pain and syncope.  Psychiatric: Denied psychiatric symptoms, anxiety and depression.  Endocrine: Denied endocrine symptoms including hot flashes and night sweats.   OB History  Gravida Para Term Preterm AB Living  2 2    2   SAB IAB Ectopic Multiple Live Births      2    # Outcome Date GA Lbr Len/2nd Weight Sex Type Anes PTL Lv  2 Para         LIV  1 Para         LIV    Obstetric Comments  Age with 13 menstruation-11  Age with first pregnancy-21    Past Medical  History:  Diagnosis Date   Arthritis    Car sickness    Complication of anesthesia    hard to wake up   Coronary artery disease 11/2018   calcification in LAD and RCA in screening lung scan    Family history of malignant neoplasm of breast    Maternal/paternal aunts and cousin   Grade I diastolic dysfunction    Heart failure with improved ejection fraction (HFimpEF) (HCC)    Hypertension    Left bundle branch block    Lump or mass in breast 2011/2012   benign   Mild aortic regurgitation    Mild mitral regurgitation by prior echocardiogram    Obesity, unspecified    Osteopenia    took Boniva 1 1/2 years in past-doesn't want to restart it   Personal history of tobacco use, presenting hazards to health    PONV (postoperative nausea and vomiting)    Positive PPD    as a child. xray was normal.   Systemic sclerosis [facility])     Past Surgical History:  Procedure Laterality Date   APPENDECTOMY  1961   BREAST BIOPSY Right 09/2010   benign   BREAST BIOPSY Left 09/2011   dilated ducts-benign   BUNIONECTOMY Bilateral 2010   x 2   CATARACT EXTRACTION W/PHACO Right 06/21/2023   Procedure: CATARACT EXTRACTION PHACO AND INTRAOCULAR LENS PLACEMENT (IOC) RIGHT 21.59 01:32.9;  Surgeon: Galen Manila, MD;  Location: [facility];  Service: Ophthalmology;  Laterality: Right;   CATARACT EXTRACTION W/PHACO Left 07/05/2023   Procedure: CATARACT EXTRACTION PHACO AND INTRAOCULAR LENS PLACEMENT (IOC) LEFT  14.23 1:08.3;  Surgeon: Galen Manila, MD;  Location: [facility];  Service: Ophthalmology;  Laterality: Left;   CHOLECYSTECTOMY N/A 10/03/2019   Procedure: LAPAROSCOPIC CHOLECYSTECTOMY;  Surgeon: Duanne Guess, MD;  Location: [facility] ORS;  Service: General;  Laterality: N/A;   COLONOSCOPY  06/2004   Dr. Mechele Collin   COLONOSCOPY WITH PROPOFOL N/A 01/10/2019   Procedure: COLONOSCOPY WITH PROPOFOL;  Surgeon: Pasty Spillers, MD;  Location: [facility];  Service: Endoscopy;   Laterality: N/A;   DECOMPRESSIVE LUMBAR LAMINECTOMY LEVEL 2 N/A 04/30/2015   Procedure: CENTRAL DECOMPRESSIVE LUMBER LAMINECTOMY WITH POSSIBLE FORAMINOTOMIES L3-L4,L4-L5;  Surgeon: Ranee Gosselin, MD;  Location: [facility];  Service: Orthopedics;  Laterality: N/A;   ERCP W/ SPHICTEROTOMY     ESOPHAGOGASTRODUODENOSCOPY (EGD) WITH PROPOFOL N/A 04/07/2021   Procedure: ESOPHAGOGASTRODUODENOSCOPY (EGD) WITH PROPOFOL;  Surgeon: Pasty Spillers, MD;  Location: [facility];  Service: Endoscopy;  Laterality: N/A;   LEFT HEART CATH AND CORONARY ANGIOGRAPHY Left 03/17/2022   Procedure: LEFT HEART CATH AND CORONARY ANGIOGRAPHY;  Surgeon: Yvonne Kendall, MD;  Location: [facility] IN[facility];  Service: Cardiovascular;  Laterality: Left;   POLYPECTOMY  04/07/2021   Procedure: POLYPECTOMY;  Surgeon: Pasty Spillers, MD;  Location: [facility];  Service: Endoscopy;;   TONSILLECTOMY  1968   TUBAL LIGATION  1974      SOCIAL HISTORY:  Social History   Tobacco Use  Smoking Status Former   Current packs/day: 0.00   Average packs/day: 1 pack/day for 50.0 years (50.0 ttl pk-yrs)   Types: Cigarettes   Start date: 11/29/1964   Quit date: 11/29/2014   Years since quitting: 9.0  Smokeless Tobacco Former   Quit date: 04/30/2015   Social History   Substance and Sexual Activity  Alcohol Use Yes   Comment: occasional glass of wine    Social History   Substance and Sexual Activity  Drug Use No    Family History  Problem Relation Age of Onset   Lung cancer Father    Heart disease Father 46       Cardiac Arrest   Heart disease Mother    Hypertension Mother    Stroke Mother    Other Brother        blood clots   Other Other        hypercoag-family history   Cancer Maternal Aunt        Breast cancer   Breast cancer Maternal Aunt 50   Breast cancer Paternal Aunt 70   Breast cancer Cousin     ALLERGIES:  Patient has no known allergies.  MEDS:   Current Outpatient Medications  on File Prior to Visit  Medication Sig Dispense Refill   acetaminophen (TYLENOL) 500 MG tablet Take 500-1,000 mg by mouth every 6 (six) hours as needed for moderate pain.     Calcium Citrate-Vitamin D (CALCIUM CITRATE+D3 PO) Take by mouth daily. 400/500     carvedilol (COREG) 3.125 MG tablet TAKE 1 TABLET BY MOUTH 2 TIMES DAILY WITH A MEAL. 90 tablet 3   dapagliflozin propanediol (FARXIGA) 10 MG TABS tablet Take 1 tablet (10 mg total) by mouth daily before breakfast. 100 tablet 3   estradiol (ESTRACE VAGINAL) 0.1 MG/GM vaginal cream Place 0.25 Applicatorfuls vaginally 3 (three) times a week. 42.5 g 1   fluticasone (FLONASE) 50 MCG/ACT nasal spray Place 2 sprays into both nostrils daily. 16 g 1   furosemide (LASIX) 20 MG tablet Take 1 tablet (20 mg total) by mouth daily as needed  for edema or fluid. 30 tablet 3   gabapentin (NEURONTIN) 100 MG capsule Take 1 capsule by mouth 3 (three) times daily as needed.     loratadine (CLARITIN) 10 MG tablet Take 1 tablet (10 mg total) by mouth daily. 30 tablet 1   meloxicam (MOBIC) 15 MG tablet Take 15 mg by mouth daily as needed for pain.     methocarbamol (ROBAXIN-750) 750 MG tablet Take 1 tablet (750 mg total) by mouth 3 (three) times daily as needed for muscle spasms. 30 tablet 1   Multiple Vitamins-Minerals (PRESERVISION AREDS 2 PO) Take 1 tablet by mouth 2 (two) times daily.     psyllium (METAMUCIL) 58.6 % packet Take 1 packet by mouth daily at 6 (six) AM.     rosuvastatin (CRESTOR) 20 MG tablet TAKE 1 TABLET BY MOUTH EVERY EVENING 90 tablet 3   sacubitril-valsartan (ENTRESTO) 49-51 MG Take 1 tablet by mouth 2 (two) times daily. 60 tablet 11   spironolactone (ALDACTONE) 25 MG tablet Take 1 tablet (25 mg total) by mouth daily. 90 tablet 3   traMADol (ULTRAM) 50 MG tablet TAKE ONE TABLET BY MOUTH TWICE DAILY AS NEEDED 30 tablet 1   No current facility-administered medications on file prior to visit.    No orders of the defined types were placed in this  encounter.    Physical examination BP 101/70   Pulse (!) 130   Ht 5\' 2"  (1.575 m)   Wt 176 lb 14.4 oz (80.2 kg)   BMI 32.36 kg/m   General NAD, Conversant  HEENT Atraumatic; Op clear with mmm.  Normo-cephalic.  Anicteric sclerae  Thyroid/Neck Smooth without nodularity or enlargement. Normal ROM.  Neck Supple.  Skin No rashes, lesions or ulceration. Normal palpated skin turgor. No nodularity.  Breasts: No masses or discharge.  Symmetric.  No axillary adenopathy.  Lungs: Clear to auscultation.No rales or wheezes. Normal Respiratory effort, no retractions.  Heart: NSR.  No murmurs or rubs appreciated. No peripheral edema  Abdomen: Soft.  Non-tender.  No masses.  No HSM. No hernia  Extremities: Moves all appropriately.  Normal ROM for age. No lymphadenopathy.  Neuro: Oriented to PPT.  Normal mood. Normal affect.     Pelvic:   Vulva: Normal appearance.  No lesions.  Vagina: No lesions or abnormalities noted.  Support: Uterine procidentia with cystocele and rectocele. Large mass protruding from the vagina consistent with bladder and uterus.  Urethra No masses tenderness or scarring.  Meatus Normal size without lesions or prolapse.  Cervix: Normal ectropion.  No lesions.  Anus: Normal exam.  No lesions.  Perineum: Normal exam.  No lesions.        Bimanual   Uterus: Normal size.  Non-tender.  Mobile.  AV.  Adnexae: No masses.  Non-tender to palpation.  Cul-de-sac: Negative for abnormality.    Assessment:   G2P2 Patient Active Problem List   Diagnosis Date Noted   Cough 12/27/2023   Urine frequency 10/11/2023   HFrEF (heart failure with reduced ejection fraction) (HCC) 03/17/2022   Hiatal hernia    Acute gastric erosion    Thyroid nodule 12/30/2020   Abdominal pain 06/27/2020   Abnormal LFTs 06/27/2020   Bursitis of hip 04/17/2020   Epigastric pain 12/28/2019   Pain in thumb joint with movement, right 11/13/2019   Osteoarthritis of knee 11/13/2019   Symptomatic  cholelithiasis    Umbilical hernia without obstruction and without gangrene    Right bundle branch block (RBBB) with incomplete left bundle branch  block (LBBB) 09/28/2019   Calculus of gallbladder without cholecystitis without obstruction 09/20/2019   Elevated liver enzymes 09/20/2019   Chronic diarrhea    Benign neoplasm of descending colon    Atherosclerosis of aorta (HCC) 12/13/2018   Coronary artery disease 11/2018   HLD (hyperlipidemia) 11/17/2018   Uterine prolapse 11/17/2018   Diarrhea 11/17/2018   Family history of clotting disorder 11/15/2017   Postmenopausal estrogen deficiency 10/06/2015   Spinal stenosis, lumbar region, with neurogenic claudication 04/30/2015   Low back pain 03/25/2015   Screening for breast cancer 09/13/2014   Obesity (BMI 30-39.9) 09/13/2014   HTN (hypertension) 08/31/2013   Osteoporosis 06/27/2007    1. Pre-op exam   2. Female genital prolapse, unspecified type   3. Uterine procidentia      Plan:   Orders: No orders of the defined types were placed in this encounter.    1.  LAVH, BSO, anterior and posterior repair, TOT.

## 2023-12-29 NOTE — Progress Notes (Signed)
PRE-OPERATIVE HISTORY AND PHYSICAL EXAM  PCP:  Allegra Grana, FNP Subjective:   HPI:  Valerie Curry is a 78 y.o. G2P2.  No LMP recorded. Patient is postmenopausal.  She presents today for a pre-op discussion and PE.  She has the following symptoms: Pelvic organ prolapse, stress urinary incontinence   She states that she is not currently sexually active and has no plans for sex in the future.  Would like the repair is small and tight as possible.  Patient has been asked to get cardiac clearance prior to surgery and she reports she went for an appointment and received cardiac clearance from her cardiologist.  We are awaiting a note that confirms this.  Review of Systems:   Constitutional: Denied constitutional symptoms, night sweats, recent illness, fatigue, fever, insomnia and weight loss.  Eyes: Denied eye symptoms, eye pain, photophobia, vision change and visual disturbance.  Ears/Nose/Throat/Neck: Denied ear, nose, throat or neck symptoms, hearing loss, nasal discharge, sinus congestion and sore throat.  Cardiovascular: Denied cardiovascular symptoms, arrhythmia, chest pain/pressure, edema, exercise intolerance, orthopnea and palpitations.  Respiratory: Denied pulmonary symptoms, asthma, pleuritic pain, productive sputum, cough, dyspnea and wheezing.  Gastrointestinal: Denied, gastro-esophageal reflux, melena, nausea and vomiting.  Genitourinary: See HPI for additional information.  Musculoskeletal: Denied musculoskeletal symptoms, stiffness, swelling, muscle weakness and myalgia.  Dermatologic: Denied dermatology symptoms, rash and scar.  Neurologic: Denied neurology symptoms, dizziness, headache, neck pain and syncope.  Psychiatric: Denied psychiatric symptoms, anxiety and depression.  Endocrine: Denied endocrine symptoms including hot flashes and night sweats.   OB History  Gravida Para Term Preterm AB Living  2 2    2   SAB IAB Ectopic Multiple Live Births      2     # Outcome Date GA Lbr Len/2nd Weight Sex Type Anes PTL Lv  2 Para         LIV  1 Para         LIV    Obstetric Comments  Age with 67 menstruation-11  Age with first pregnancy-21    Past Medical History:  Diagnosis Date   Arthritis    Car sickness    Complication of anesthesia    hard to wake up   Coronary artery disease 11/2018   calcification in LAD and RCA in screening lung scan    Family history of malignant neoplasm of breast    Maternal/paternal aunts and cousin   Grade I diastolic dysfunction    Heart failure with improved ejection fraction (HFimpEF) (HCC)    Hypertension    Left bundle branch block    Lump or mass in breast 2011/2012   benign   Mild aortic regurgitation    Mild mitral regurgitation by prior echocardiogram    Obesity, unspecified    Osteopenia    took Boniva 1 1/2 years in past-doesn't want to restart it   Personal history of tobacco use, presenting hazards to health    PONV (postoperative nausea and vomiting)    Positive PPD    as a child. xray was normal.   Systemic sclerosis [facility])     Past Surgical History:  Procedure Laterality Date   APPENDECTOMY  1961   BREAST BIOPSY Right 09/2010   benign   BREAST BIOPSY Left 09/2011   dilated ducts-benign   BUNIONECTOMY Bilateral 2010   x 2   CATARACT EXTRACTION W/PHACO Right 06/21/2023   Procedure: CATARACT EXTRACTION PHACO AND INTRAOCULAR LENS PLACEMENT (IOC) RIGHT 21.59  01:32.9;  Surgeon: Galen Manila, MD;  Location: [facility];  Service: Ophthalmology;  Laterality: Right;   CATARACT EXTRACTION W/PHACO Left 07/05/2023   Procedure: CATARACT EXTRACTION PHACO AND INTRAOCULAR LENS PLACEMENT (IOC) LEFT 14.23 1:08.3;  Surgeon: Galen Manila, MD;  Location: [facility];  Service: Ophthalmology;  Laterality: Left;   CHOLECYSTECTOMY N/A 10/03/2019   Procedure: LAPAROSCOPIC CHOLECYSTECTOMY;  Surgeon: Duanne Guess, MD;  Location: [facility] ORS;  Service: General;  Laterality: N/A;    COLONOSCOPY  06/2004   Dr. Mechele Collin   COLONOSCOPY WITH PROPOFOL N/A 01/10/2019   Procedure: COLONOSCOPY WITH PROPOFOL;  Surgeon: Pasty Spillers, MD;  Location: [facility] ENDOSCOPY;  Service: Endoscopy;  Laterality: N/A;   DECOMPRESSIVE LUMBAR LAMINECTOMY LEVEL 2 N/A 04/30/2015   Procedure: CENTRAL DECOMPRESSIVE LUMBER LAMINECTOMY WITH POSSIBLE FORAMINOTOMIES L3-L4,L4-L5;  Surgeon: Ranee Gosselin, MD;  Location: [facility];  Service: Orthopedics;  Laterality: N/A;   ERCP W/ SPHICTEROTOMY     ESOPHAGOGASTRODUODENOSCOPY (EGD) WITH PROPOFOL N/A 04/07/2021   Procedure: ESOPHAGOGASTRODUODENOSCOPY (EGD) WITH PROPOFOL;  Surgeon: Pasty Spillers, MD;  Location: [facility];  Service: Endoscopy;  Laterality: N/A;   LEFT HEART CATH AND CORONARY ANGIOGRAPHY Left 03/17/2022   Procedure: LEFT HEART CATH AND CORONARY ANGIOGRAPHY;  Surgeon: Yvonne Kendall, MD;  Location: [facility] INVASIVE CV LAB;  Service: Cardiovascular;  Laterality: Left;   POLYPECTOMY  04/07/2021   Procedure: POLYPECTOMY;  Surgeon: Pasty Spillers, MD;  Location: [facility];  Service: Endoscopy;;   TONSILLECTOMY  1968   TUBAL LIGATION  1974      SOCIAL HISTORY:  Social History   Tobacco Use  Smoking Status Former   Current packs/day: 0.00   Average packs/day: 1 pack/day for 50.0 years (50.0 ttl pk-yrs)   Types: Cigarettes   Start date: 11/29/1964   Quit date: 11/29/2014   Years since quitting: 9.0  Smokeless Tobacco Former   Quit date: 04/30/2015   Social History   Substance and Sexual Activity  Alcohol Use Yes   Comment: occasional glass of wine    Social History   Substance and Sexual Activity  Drug Use No    Family History  Problem Relation Age of Onset   Lung cancer Father    Heart disease Father 108       Cardiac Arrest   Heart disease Mother    Hypertension Mother    Stroke Mother    Other Brother        blood clots   Other Other        hypercoag-family history   Cancer Maternal Aunt         Breast cancer   Breast cancer Maternal Aunt 50   Breast cancer Paternal Aunt 47   Breast cancer Cousin     ALLERGIES:  Patient has no known allergies.  MEDS:   Current Outpatient Medications on File Prior to Visit  Medication Sig Dispense Refill   acetaminophen (TYLENOL) 500 MG tablet Take 500-1,000 mg by mouth every 6 (six) hours as needed for moderate pain.     Calcium Citrate-Vitamin D (CALCIUM CITRATE+D3 PO) Take by mouth daily. 400/500     carvedilol (COREG) 3.125 MG tablet TAKE 1 TABLET BY MOUTH 2 TIMES DAILY WITH A MEAL. 90 tablet 3   dapagliflozin propanediol (FARXIGA) 10 MG TABS tablet Take 1 tablet (10 mg total) by mouth daily before breakfast. 100 tablet 3   estradiol (ESTRACE VAGINAL) 0.1 MG/GM vaginal cream Place 0.25 Applicatorfuls vaginally 3 (three) times a week. 42.5 g 1  fluticasone (FLONASE) 50 MCG/ACT nasal spray Place 2 sprays into both nostrils daily. 16 g 1   furosemide (LASIX) 20 MG tablet Take 1 tablet (20 mg total) by mouth daily as needed for edema or fluid. 30 tablet 3   gabapentin (NEURONTIN) 100 MG capsule Take 1 capsule by mouth 3 (three) times daily as needed.     loratadine (CLARITIN) 10 MG tablet Take 1 tablet (10 mg total) by mouth daily. 30 tablet 1   meloxicam (MOBIC) 15 MG tablet Take 15 mg by mouth daily as needed for pain.     methocarbamol (ROBAXIN-750) 750 MG tablet Take 1 tablet (750 mg total) by mouth 3 (three) times daily as needed for muscle spasms. 30 tablet 1   Multiple Vitamins-Minerals (PRESERVISION AREDS 2 PO) Take 1 tablet by mouth 2 (two) times daily.     psyllium (METAMUCIL) 58.6 % packet Take 1 packet by mouth daily at 6 (six) AM.     rosuvastatin (CRESTOR) 20 MG tablet TAKE 1 TABLET BY MOUTH EVERY EVENING 90 tablet 3   sacubitril-valsartan (ENTRESTO) 49-51 MG Take 1 tablet by mouth 2 (two) times daily. 60 tablet 11   spironolactone (ALDACTONE) 25 MG tablet Take 1 tablet (25 mg total) by mouth daily. 90 tablet 3   traMADol  (ULTRAM) 50 MG tablet TAKE ONE TABLET BY MOUTH TWICE DAILY AS NEEDED 30 tablet 1   No current facility-administered medications on file prior to visit.    Meds ordered this encounter  Medications   metroNIDAZOLE (FLAGYL) 500 MG tablet    Sig: Take 1 tablet (500 mg total) by mouth 2 (two) times daily. Begin 5 days prior to scheduled surgery as directed.    Dispense:  10 tablet    Refill:  0     Physical examination BP 101/70   Pulse (!) 130   Ht 5\' 2"  (1.575 m)   Wt 176 lb 14.4 oz (80.2 kg)   BMI 32.36 kg/m   General NAD, Conversant  HEENT Atraumatic; Op clear with mmm.  Normo-cephalic.  Anicteric sclerae  Thyroid/Neck Smooth without nodularity or enlargement. Normal ROM.  Neck Supple.  Skin No rashes, lesions or ulceration. Normal palpated skin turgor. No nodularity.  Breasts: No masses or discharge.  Symmetric.  No axillary adenopathy.  Lungs: Clear to auscultation.No rales or wheezes. Normal Respiratory effort, no retractions.  Heart: NSR.  No murmurs or rubs appreciated. No peripheral edema  Abdomen: Soft.  Non-tender.  No masses.  No HSM. No hernia  Extremities: Moves all appropriately.  Normal ROM for age. No lymphadenopathy.  Neuro: Oriented to PPT.  Normal mood. Normal affect.     Pelvic:   Vulva: Normal appearance.  No lesions.  Vagina: No lesions or abnormalities noted.  Support: Uterine procidentia with cystocele and rectocele. Large mass protruding from the vagina consistent with bladder and uterus.  Urethra No masses tenderness or scarring.  Meatus Normal size without lesions or prolapse.  Cervix: Normal ectropion.  No lesions.  Anus: Normal exam.  No lesions.  Perineum: Normal exam.  No lesions.        Bimanual   Uterus: Normal size.  Non-tender.  Mobile.  AV.  Adnexae: No masses.  Non-tender to palpation.  Cul-de-sac: Negative for abnormality.    Assessment:   G2P2 Patient Active Problem List   Diagnosis Date Noted   Cough 12/27/2023   Urine  frequency 10/11/2023   HFrEF (heart failure with reduced ejection fraction) (HCC) 03/17/2022   Hiatal hernia  Acute gastric erosion    Thyroid nodule 12/30/2020   Abdominal pain 06/27/2020   Abnormal LFTs 06/27/2020   Bursitis of hip 04/17/2020   Epigastric pain 12/28/2019   Pain in thumb joint with movement, right 11/13/2019   Osteoarthritis of knee 11/13/2019   Symptomatic cholelithiasis    Umbilical hernia without obstruction and without gangrene    Right bundle branch block (RBBB) with incomplete left bundle branch block (LBBB) 09/28/2019   Calculus of gallbladder without cholecystitis without obstruction 09/20/2019   Elevated liver enzymes 09/20/2019   Chronic diarrhea    Benign neoplasm of descending colon    Atherosclerosis of aorta (HCC) 12/13/2018   Coronary artery disease 11/2018   HLD (hyperlipidemia) 11/17/2018   Uterine prolapse 11/17/2018   Diarrhea 11/17/2018   Family history of clotting disorder 11/15/2017   Postmenopausal estrogen deficiency 10/06/2015   Spinal stenosis, lumbar region, with neurogenic claudication 04/30/2015   Low back pain 03/25/2015   Screening for breast cancer 09/13/2014   Obesity (BMI 30-39.9) 09/13/2014   HTN (hypertension) 08/31/2013   Osteoporosis 06/27/2007    1. Pre-op exam   2. Female genital prolapse, unspecified type   3. Uterine procidentia      Plan:   Orders: Meds ordered this encounter  Medications   metroNIDAZOLE (FLAGYL) 500 MG tablet    Sig: Take 1 tablet (500 mg total) by mouth 2 (two) times daily. Begin 5 days prior to scheduled surgery as directed.    Dispense:  10 tablet    Refill:  0     1.  LAVH, BSO, anterior and posterior repair, TOT.    Pre-op discussions regarding Risks and Benefits of her scheduled surgery  LAVH The procedure of Laparoscopic Assisted Vaginal Hysterectomy was described to the patient in detail.  We reviewed the rationale for Hysterectomy and the patient was again informed of  other nonsurgical management possibilities for her condition.  She has considered these other options, and desires a Hysterectomy.  We have reviewed the fact that Hysterectomy is permanent and that following the procedure she will not be able to become pregnant or bear children.  We have discussed the following risk factors specifically and the patient has also been informed that additional complications not mentioned may develop:  Damage to bowel, bladder, ureters or to other internal organs, bleeding, infection and the risk from anesthesia.  We have discussed the procedure itself in detail and she has an informed understanding of this surgery.  We have also discussed the recovery period in which physical and sexual activity will be restricted for a varying degree of time, often 3 - 6 weeks. The Laparoscopic Portion of Hysterectomy has also been reviewed with the patient.  She understands how the laparoscope facilitates the procedure.  We have discussed the abdominal incisions and punctures that will be used.  We have also reviewed the increased Operating Room time often accompanying LAVH.  The slightly increased risk of complications secondary to abdominal punctures, and use of laparoscopic instrumentation has also been discussed in detail.I have answered all of her questions and I believe the patient has an informed understanding of the procedure of Laparoscopic Assisted Vaginal Hysterectomy. Oophorectomy The option of Oophorectomy has been discussed with the patient.  Detailed risk/benefits have been reviewed.  The risks discussed include, but are not limited to, hemorrhage, infection, damage to ureter or other internal organ, and Ovarian Remnant Syndrome.  The benefits include a significant decrease in the risk of Ovarian Cancer and in benign Ovarian  disease.  The risk of Ovarian CA has been estimated at 1 in 70.  This is a relatively small risk.  However, should Ovarian CA develop, it is often found late in  the course of the disease.  We have also discussed the role of inheritance in the development of Ovarian disease.  Some women, who have close relatives with Ovarian CA, have a higher than 1 in 70 risk of Ovarian CA.  The benefits of Estrogen replacement therapy following Oophorectomy has been stressed.  If she is premenopausal, we have discussed the fact that this procedure will make her permanently sterile and that premature menopause will result if no ERT is begun.  I have answered all of her questions, and I believe that she has an adequate and informed understanding of the risks and benefits of Oophorectomy. Anterior Repair I have discussed the procedure of anterior repair and Kelly placation.  I have informed the patient that this procedure often corrects or improves stress urinary incontinence, but that there is certainly no guarantee of her improvement.  The procedure itself was discussed.  The possible damage to the bowel, ureters or urethra was also discussed.  We have reviewed the repositioning of the bladder that often takes place at anterior repair and I have informed her that although unlikely, it is possible that a worsening of her incontinence could occur after this procedure.  I have also discussed with her the necessity of decreased lifting and physical activity following the procedure as well as the possibility that as she gets older, her stress urinary incontinence could slowly return.  The use of vaginal Estrogen or oral Estrogen as well as other medications in the role of both stress and bladder dysynergia incontinence were discussed.  I have discussed the complication of inability to void immediately following the procedure.  The patient is aware that she may go home using a Foley catheter or may be taught the technique of self-catheterization should a complication develop.  All of her questions have been answered and I believe that she has an informed understanding of anterior repair/Kelly  plication. Posterior Repair Posterior repair was discussed with the patient.  The risks were reviewed and include:  possible damage to rectum and bowel, bleeding, infection and anesthesia.  The benefits were also discussed.  Post-op recovery with special attention to hospital stay and return to sexual function were specifically reviewed.  All her questions were answered, and I believe that she has an informed understanding of Posterior repair.  TOT I have discussed the procedure of tension free vaginal tape using the trans-obturator approach. (TOT).  I have informed the patient that this procedure often corrects or improves stress urinary incontinence.  For patients without urinary incontinence-this procedure is often performed to lower the risk of iatragenic urinary incontinence at the time of cystocele repair.The patient has been made aware that there is no guarantee of her improvement or the length of time her improvement will last.  The procedure itself was discussed in detail including possible damage to bowel, ureters, urethra and bladder.  I have informed her that the mesh is permanent.  The risk of extrusion of the mesh has also been reviewed.  The management of this complication has been discussed.  The risks of bleeding and infection were also reviewed.  I have specifically discussed the complication of inability to void following the procedure and the patient is aware that she may go home using a Foley catheter or using a self-catheterization technique.  Possibility of home catheter removal discussed and literature given. In addition, I have discussed with the patient the use of cystoscopy to diagnose bladder injury should there be any question of this occurrence.       Elonda Husky, M.D. 12/29/2023 3:08 PM

## 2023-12-29 NOTE — H&P (Signed)
PRE-OPERATIVE HISTORY AND PHYSICAL EXAM  PCP:  Allegra Grana, FNP Subjective:   HPI:  Valerie Curry is a 78 y.o. G2P2.  No LMP recorded. Patient is postmenopausal.  She presents today for a pre-op discussion and PE.  She has the following symptoms: Pelvic organ prolapse, stress urinary incontinence   She states that she is not currently sexually active and has no plans for sex in the future.  Would like the repair is small and tight as possible.  Review of Systems:   Constitutional: Denied constitutional symptoms, night sweats, recent illness, fatigue, fever, insomnia and weight loss.  Eyes: Denied eye symptoms, eye pain, photophobia, vision change and visual disturbance.  Ears/Nose/Throat/Neck: Denied ear, nose, throat or neck symptoms, hearing loss, nasal discharge, sinus congestion and sore throat.  Cardiovascular: Denied cardiovascular symptoms, arrhythmia, chest pain/pressure, edema, exercise intolerance, orthopnea and palpitations.  Respiratory: Denied pulmonary symptoms, asthma, pleuritic pain, productive sputum, cough, dyspnea and wheezing.  Gastrointestinal: Denied, gastro-esophageal reflux, melena, nausea and vomiting.  Genitourinary: See HPI for additional information.  Musculoskeletal: Denied musculoskeletal symptoms, stiffness, swelling, muscle weakness and myalgia.  Dermatologic: Denied dermatology symptoms, rash and scar.  Neurologic: Denied neurology symptoms, dizziness, headache, neck pain and syncope.  Psychiatric: Denied psychiatric symptoms, anxiety and depression.  Endocrine: Denied endocrine symptoms including hot flashes and night sweats.   OB History  Gravida Para Term Preterm AB Living  2 2    2   SAB IAB Ectopic Multiple Live Births      2    # Outcome Date GA Lbr Len/2nd Weight Sex Type Anes PTL Lv  2 Para         LIV  1 Para         LIV    Obstetric Comments  Age with 13 menstruation-11  Age with first pregnancy-21    Past Medical  History:  Diagnosis Date   Arthritis    Car sickness    Complication of anesthesia    hard to wake up   Coronary artery disease 11/2018   calcification in LAD and RCA in screening lung scan    Family history of malignant neoplasm of breast    Maternal/paternal aunts and cousin   Grade I diastolic dysfunction    Heart failure with improved ejection fraction (HFimpEF) (HCC)    Hypertension    Left bundle branch block    Lump or mass in breast 2011/2012   benign   Mild aortic regurgitation    Mild mitral regurgitation by prior echocardiogram    Obesity, unspecified    Osteopenia    took Boniva 1 1/2 years in past-doesn't want to restart it   Personal history of tobacco use, presenting hazards to health    PONV (postoperative nausea and vomiting)    Positive PPD    as a child. xray was normal.   Systemic sclerosis [facility])     Past Surgical History:  Procedure Laterality Date   APPENDECTOMY  1961   BREAST BIOPSY Right 09/2010   benign   BREAST BIOPSY Left 09/2011   dilated ducts-benign   BUNIONECTOMY Bilateral 2010   x 2   CATARACT EXTRACTION W/PHACO Right 06/21/2023   Procedure: CATARACT EXTRACTION PHACO AND INTRAOCULAR LENS PLACEMENT (IOC) RIGHT 21.59 01:32.9;  Surgeon: Galen Manila, MD;  Location: [facility];  Service: Ophthalmology;  Laterality: Right;   CATARACT EXTRACTION W/PHACO Left 07/05/2023   Procedure: CATARACT EXTRACTION PHACO AND INTRAOCULAR LENS PLACEMENT (IOC) LEFT  14.23 1:08.3;  Surgeon: Galen Manila, MD;  Location: [facility];  Service: Ophthalmology;  Laterality: Left;   CHOLECYSTECTOMY N/A 10/03/2019   Procedure: LAPAROSCOPIC CHOLECYSTECTOMY;  Surgeon: Duanne Guess, MD;  Location: [facility] ORS;  Service: General;  Laterality: N/A;   COLONOSCOPY  06/2004   Dr. Mechele Collin   COLONOSCOPY WITH PROPOFOL N/A 01/10/2019   Procedure: COLONOSCOPY WITH PROPOFOL;  Surgeon: Pasty Spillers, MD;  Location: [facility] END[facility];  Service: Endoscopy;   Laterality: N/A;   DECOMPRESSIVE LUMBAR LAMINECTOMY LEVEL 2 N/A 04/30/2015   Procedure: CENTRAL DECOMPRESSIVE LUMBER LAMINECTOMY WITH POSSIBLE FORAMINOTOMIES L3-L4,L4-L5;  Surgeon: Ranee Gosselin, MD;  Location: [facility];  Service: Orthopedics;  Laterality: N/A;   ERCP W/ SPHICTEROTOMY     ESOPHAGOGASTRODUODENOSCOPY (EGD) WITH PROPOFOL N/A 04/07/2021   Procedure: ESOPHAGOGASTRODUODENOSCOPY (EGD) WITH PROPOFOL;  Surgeon: Pasty Spillers, MD;  Location: [facility];  Service: Endoscopy;  Laterality: N/A;   LEFT HEART CATH AND CORONARY ANGIOGRAPHY Left 03/17/2022   Procedure: LEFT HEART CATH AND CORONARY ANGIOGRAPHY;  Surgeon: Yvonne Kendall, MD;  Location: [facility] INVASIVE CV LAB;  Service: Cardiovascular;  Laterality: Left;   POLYPECTOMY  04/07/2021   Procedure: POLYPECTOMY;  Surgeon: Pasty Spillers, MD;  Location: [facility];  Service: Endoscopy;;   TONSILLECTOMY  1968   TUBAL LIGATION  1974      SOCIAL HISTORY:  Social History   Tobacco Use  Smoking Status Former   Current packs/day: 0.00   Average packs/day: 1 pack/day for 50.0 years (50.0 ttl pk-yrs)   Types: Cigarettes   Start date: 11/29/1964   Quit date: 11/29/2014   Years since quitting: 9.0  Smokeless Tobacco Former   Quit date: 04/30/2015   Social History   Substance and Sexual Activity  Alcohol Use Yes   Comment: occasional glass of wine    Social History   Substance and Sexual Activity  Drug Use No    Family History  Problem Relation Age of Onset   Lung cancer Father    Heart disease Father 46       Cardiac Arrest   Heart disease Mother    Hypertension Mother    Stroke Mother    Other Brother        blood clots   Other Other        hypercoag-family history   Cancer Maternal Aunt        Breast cancer   Breast cancer Maternal Aunt 50   Breast cancer Paternal Aunt 70   Breast cancer Cousin     ALLERGIES:  Patient has no known allergies.  MEDS:   Current Outpatient Medications  on File Prior to Visit  Medication Sig Dispense Refill   acetaminophen (TYLENOL) 500 MG tablet Take 500-1,000 mg by mouth every 6 (six) hours as needed for moderate pain.     Calcium Citrate-Vitamin D (CALCIUM CITRATE+D3 PO) Take by mouth daily. 400/500     carvedilol (COREG) 3.125 MG tablet TAKE 1 TABLET BY MOUTH 2 TIMES DAILY WITH A MEAL. 90 tablet 3   dapagliflozin propanediol (FARXIGA) 10 MG TABS tablet Take 1 tablet (10 mg total) by mouth daily before breakfast. 100 tablet 3   estradiol (ESTRACE VAGINAL) 0.1 MG/GM vaginal cream Place 0.25 Applicatorfuls vaginally 3 (three) times a week. 42.5 g 1   fluticasone (FLONASE) 50 MCG/ACT nasal spray Place 2 sprays into both nostrils daily. 16 g 1   furosemide (LASIX) 20 MG tablet Take 1 tablet (20 mg total) by mouth daily as needed  for edema or fluid. 30 tablet 3   gabapentin (NEURONTIN) 100 MG capsule Take 1 capsule by mouth 3 (three) times daily as needed.     loratadine (CLARITIN) 10 MG tablet Take 1 tablet (10 mg total) by mouth daily. 30 tablet 1   meloxicam (MOBIC) 15 MG tablet Take 15 mg by mouth daily as needed for pain.     methocarbamol (ROBAXIN-750) 750 MG tablet Take 1 tablet (750 mg total) by mouth 3 (three) times daily as needed for muscle spasms. 30 tablet 1   Multiple Vitamins-Minerals (PRESERVISION AREDS 2 PO) Take 1 tablet by mouth 2 (two) times daily.     psyllium (METAMUCIL) 58.6 % packet Take 1 packet by mouth daily at 6 (six) AM.     rosuvastatin (CRESTOR) 20 MG tablet TAKE 1 TABLET BY MOUTH EVERY EVENING 90 tablet 3   sacubitril-valsartan (ENTRESTO) 49-51 MG Take 1 tablet by mouth 2 (two) times daily. 60 tablet 11   spironolactone (ALDACTONE) 25 MG tablet Take 1 tablet (25 mg total) by mouth daily. 90 tablet 3   traMADol (ULTRAM) 50 MG tablet TAKE ONE TABLET BY MOUTH TWICE DAILY AS NEEDED 30 tablet 1   No current facility-administered medications on file prior to visit.    No orders of the defined types were placed in this  encounter.    Physical examination BP 101/70   Pulse (!) 130   Ht 5\' 2"  (1.575 m)   Wt 176 lb 14.4 oz (80.2 kg)   BMI 32.36 kg/m   General NAD, Conversant  HEENT Atraumatic; Op clear with mmm.  Normo-cephalic.  Anicteric sclerae  Thyroid/Neck Smooth without nodularity or enlargement. Normal ROM.  Neck Supple.  Skin No rashes, lesions or ulceration. Normal palpated skin turgor. No nodularity.  Breasts: No masses or discharge.  Symmetric.  No axillary adenopathy.  Lungs: Clear to auscultation.No rales or wheezes. Normal Respiratory effort, no retractions.  Heart: NSR.  No murmurs or rubs appreciated. No peripheral edema  Abdomen: Soft.  Non-tender.  No masses.  No HSM. No hernia  Extremities: Moves all appropriately.  Normal ROM for age. No lymphadenopathy.  Neuro: Oriented to PPT.  Normal mood. Normal affect.     Pelvic:   Vulva: Normal appearance.  No lesions.  Vagina: No lesions or abnormalities noted.  Support: Uterine procidentia with cystocele and rectocele. Large mass protruding from the vagina consistent with bladder and uterus.  Urethra No masses tenderness or scarring.  Meatus Normal size without lesions or prolapse.  Cervix: Normal ectropion.  No lesions.  Anus: Normal exam.  No lesions.  Perineum: Normal exam.  No lesions.        Bimanual   Uterus: Normal size.  Non-tender.  Mobile.  AV.  Adnexae: No masses.  Non-tender to palpation.  Cul-de-sac: Negative for abnormality.    Assessment:   G2P2 Patient Active Problem List   Diagnosis Date Noted   Cough 12/27/2023   Urine frequency 10/11/2023   HFrEF (heart failure with reduced ejection fraction) (HCC) 03/17/2022   Hiatal hernia    Acute gastric erosion    Thyroid nodule 12/30/2020   Abdominal pain 06/27/2020   Abnormal LFTs 06/27/2020   Bursitis of hip 04/17/2020   Epigastric pain 12/28/2019   Pain in thumb joint with movement, right 11/13/2019   Osteoarthritis of knee 11/13/2019   Symptomatic  cholelithiasis    Umbilical hernia without obstruction and without gangrene    Right bundle branch block (RBBB) with incomplete left bundle branch  block (LBBB) 09/28/2019   Calculus of gallbladder without cholecystitis without obstruction 09/20/2019   Elevated liver enzymes 09/20/2019   Chronic diarrhea    Benign neoplasm of descending colon    Atherosclerosis of aorta (HCC) 12/13/2018   Coronary artery disease 11/2018   HLD (hyperlipidemia) 11/17/2018   Uterine prolapse 11/17/2018   Diarrhea 11/17/2018   Family history of clotting disorder 11/15/2017   Postmenopausal estrogen deficiency 10/06/2015   Spinal stenosis, lumbar region, with neurogenic claudication 04/30/2015   Low back pain 03/25/2015   Screening for breast cancer 09/13/2014   Obesity (BMI 30-39.9) 09/13/2014   HTN (hypertension) 08/31/2013   Osteoporosis 06/27/2007    1. Pre-op exam   2. Female genital prolapse, unspecified type   3. Uterine procidentia      Plan:   Orders: No orders of the defined types were placed in this encounter.    1.  LAVH, BSO, anterior and posterior repair, TOT.

## 2023-12-30 ENCOUNTER — Telehealth: Payer: Self-pay | Admitting: Cardiology

## 2024-01-02 ENCOUNTER — Encounter: Payer: Self-pay | Admitting: Cardiology

## 2024-01-02 NOTE — Telephone Encounter (Signed)
Patient's surgical clearance letter was sent electronically to performing physician and to patient's MyChart. Pt notified and had good understanding and no further questions.

## 2024-01-13 ENCOUNTER — Encounter
Admission: RE | Admit: 2024-01-13 | Discharge: 2024-01-13 | Disposition: A | Discharge status: Home / Self Care | Payer: PPO | Source: Ambulatory Visit | Attending: Obstetrics and Gynecology | Admitting: Obstetrics and Gynecology

## 2024-01-13 ENCOUNTER — Other Ambulatory Visit: Payer: Self-pay

## 2024-01-13 VITALS — Ht 62.0 in | Wt 170.0 lb

## 2024-01-13 DIAGNOSIS — Z01812 Encounter for preprocedural laboratory examination: Secondary | ICD-10-CM

## 2024-01-13 DIAGNOSIS — I5020 Unspecified systolic (congestive) heart failure: Secondary | ICD-10-CM

## 2024-01-13 DIAGNOSIS — I2510 Atherosclerotic heart disease of native coronary artery without angina pectoris: Secondary | ICD-10-CM

## 2024-01-13 DIAGNOSIS — I10 Essential (primary) hypertension: Secondary | ICD-10-CM

## 2024-01-13 HISTORY — DX: Benign neoplasm of descending colon: D12.4

## 2024-01-13 HISTORY — DX: Heart failure, unspecified: I50.9

## 2024-01-13 HISTORY — DX: Umbilical hernia without obstruction or gangrene: K42.9

## 2024-01-13 HISTORY — DX: Acute gastric ulcer without hemorrhage or perforation: K25.3

## 2024-01-13 HISTORY — DX: Personal history of other diseases of the digestive system: Z87.19

## 2024-01-13 HISTORY — DX: Calculus of gallbladder without cholecystitis without obstruction: K80.20

## 2024-01-13 NOTE — Patient Instructions (Addendum)
Your procedure is scheduled [id number] February 24  Report to the Registration Desk on the 1st floor of the [facility] To find out your arrival time, please call [phone] between 1PM - 3PM on:  Friday February 21 If your arrival time is 6:00 am, do not arrive before that time as the [facility] entrance doors do not open until 6:00 am.  REMEMBER: Instructions that are not followed completely may result in serious medical risk, up to and including death; or upon the discretion of your surgeon and anesthesiologist your surgery may need to be rescheduled.  Do not eat food after midnight the night before surgery.  No gum chewing or hard candies.  One week prior to surgery: Starting Monday February 17  Stop Anti-inflammatories (NSAIDS) such as Advil, Aleve, Ibuprofen, Motrin, Naproxen, Naprosyn and Aspirin based products such as Excedrin, Goody's Powder, BC Powder. Stop ANY OVER THE COUNTER supplements until after surgery. Calcium Citrate-Vitamin D  Multiple Vitamins-Minerals   You may however, continue to take Tylenol if needed for pain up until the day of surgery.  dapagliflozin propanediol (FARXIGA)  hold 3 days prior to surgery, last dose Thursday February 20  meloxicam [facility]) hold for 7 days prior to surgery, last dose Sunday February 16    Continue taking all of your other prescription medications up until the day of surgery.  ON THE DAY OF SURGERY ONLY TAKE THESE MEDICATIONS WITH SIPS OF WATER:  carvedilol (COREG)   No Alcohol for 24 hours before or after surgery.  No Smoking including e-cigarettes for 24 hours before surgery.  No chewable tobacco products for at least 6 hours before surgery.  No nicotine patches on the day of surgery.  Do not use any "recreational" drugs for at least a week (preferably 2 weeks) before your surgery.  Please be advised that the combination of cocaine and anesthesia may have negative outcomes, up to and including death. If you test  positive for cocaine, your surgery will be cancelled.  On the morning of surgery brush your teeth with toothpaste and water, you may rinse your mouth with mouthwash if you wish. Do not swallow any toothpaste or mouthwash.  Use CHG Soap as directed on instruction sheet.  Do not wear jewelry, make-up, hairpins, clips or nail polish.  For welded (permanent) jewelry: bracelets, anklets, waist bands, etc.  Please have this removed prior to surgery.  If it is not removed, there is a chance that hospital personnel will need to cut it off on the day of surgery.  Do not wear lotions, powders, or perfumes.   Do not shave body hair from the neck down 48 hours before surgery.  Contact lenses, hearing aids and dentures may not be worn into surgery.  Do not bring valuables to the hospital. [facility] is not responsible for any missing/lost belongings or valuables.   Notify your doctor if there is any change in your medical condition (cold, fever, infection).  Wear comfortable clothing (specific to your surgery type) to the hospital.  After surgery, you can help prevent lung complications by doing breathing exercises.  Take deep breaths and cough every 1-2 hours.   When coughing or sneezing, hold a pillow firmly against your incision with both hands. This is called "splinting." Doing this helps protect your incision. It also decreases belly discomfort.  If you are being discharged the day of surgery, you will not be allowed to drive home. You will need a responsible individual to drive you home and  stay with you for 24 hours after surgery.   If you are taking public transportation, you will need to have a responsible individual with you.  Please call the Pre-admissions Testing Dept. at [phone] if you have any questions about these instructions.  Surgery Visitation Policy:  Patients having surgery or a procedure may have two visitors.  Children under the age of 92 must have an adult  with them who is not the patient.  Temporary Visitor Restrictions Due to increasing cases of flu, RSV and COVID-19: Children ages 39 and under will not be able to visit patients in [facility] hospitals under most circumstances.        Preparing for Surgery with CHLORHEXIDINE GLUCONATE (CHG) Soap  Chlorhexidine Gluconate (CHG) Soap  o An antiseptic cleaner that kills germs and bonds with the skin to continue killing germs even after washing  o Used for showering the night before surgery and morning of surgery  Before surgery, you can play an important role by reducing the number of germs on your skin.  CHG (Chlorhexidine gluconate) soap is an antiseptic cleanser which kills germs and bonds with the skin to continue killing germs even after washing.  Please do not use if you have an allergy to CHG or antibacterial soaps. If your skin becomes reddened/irritated stop using the CHG.  1. Shower the NIGHT BEFORE SURGERY and the MORNING OF SURGERY with CHG soap.  2. If you choose to wash your hair, wash your hair first as usual with your normal shampoo.  3. After shampooing, rinse your hair and body thoroughly to remove the shampoo.  4. Use CHG as you would any other liquid soap. You can apply CHG directly to the skin and wash gently with a scrungie or a clean washcloth.  5. Apply the CHG soap to your body only from the neck down. Do not use on open wounds or open sores. Avoid contact with your eyes, ears, mouth, and genitals (private parts). Wash face and genitals (private parts) with your normal soap.  6. Wash thoroughly, paying special attention to the area where your surgery will be performed.  7. Thoroughly rinse your body with warm water.  8. Do not shower/wash with your normal soap after using and rinsing off the CHG soap.  9. Pat yourself dry with a clean towel.  10. Wear clean pajamas to bed the night before surgery.  12. Place clean sheets on your bed the night of your  first shower and do not sleep with pets.  13. Shower again with the CHG soap on the day of surgery prior to arriving at the hospital.  14. Do not apply any deodorants/lotions/powders.  15. Please wear clean clothes to the hospital.

## 2024-01-17 ENCOUNTER — Encounter
Admission: RE | Admit: 2024-01-17 | Discharge: 2024-01-17 | Disposition: A | Discharge status: Home / Self Care | Payer: PPO | Source: Ambulatory Visit | Attending: Obstetrics and Gynecology | Admitting: Obstetrics and Gynecology

## 2024-01-17 DIAGNOSIS — N813 Complete uterovaginal prolapse: Secondary | ICD-10-CM

## 2024-01-17 DIAGNOSIS — I10 Essential (primary) hypertension: Secondary | ICD-10-CM

## 2024-01-17 DIAGNOSIS — I5020 Unspecified systolic (congestive) heart failure: Secondary | ICD-10-CM | POA: Diagnosis not present

## 2024-01-17 DIAGNOSIS — N819 Female genital prolapse, unspecified: Secondary | ICD-10-CM

## 2024-01-17 DIAGNOSIS — I110 Hypertensive heart disease with heart failure: Secondary | ICD-10-CM | POA: Insufficient documentation

## 2024-01-17 DIAGNOSIS — Z01818 Encounter for other preprocedural examination: Secondary | ICD-10-CM

## 2024-01-17 DIAGNOSIS — Z01812 Encounter for preprocedural laboratory examination: Secondary | ICD-10-CM

## 2024-01-17 LAB — CBC
HCT: 39.9 % (ref 36.0–46.0)
Hemoglobin: 13.2 g/dL (ref 12.0–15.0)
MCH: 31.1 pg (ref 26.0–34.0)
MCHC: 33.1 g/dL (ref 30.0–36.0)
MCV: 94.1 fL (ref 80.0–100.0)
Platelets: 203 10*3/uL (ref 150–400)
RBC: 4.24 MIL/uL (ref 3.87–5.11)
RDW: 13.7 % (ref 11.5–15.5)
WBC: 4.6 10*3/uL (ref 4.0–10.5)
nRBC: 0 % (ref 0.0–0.2)

## 2024-01-17 LAB — TYPE AND SCREEN
ABO/RH(D): A POS
Antibody Screen: NEGATIVE

## 2024-01-18 ENCOUNTER — Inpatient Hospital Stay: Admission: RE | Admit: 2024-01-18 | Payer: PPO | Source: Ambulatory Visit

## 2024-01-20 ENCOUNTER — Telehealth: Payer: Self-pay | Admitting: *Deleted

## 2024-01-20 ENCOUNTER — Encounter: Payer: Self-pay | Admitting: Obstetrics and Gynecology

## 2024-01-20 NOTE — Telephone Encounter (Signed)
 After further review, pt has already been cleared by her Dr.  See Epic.

## 2024-01-20 NOTE — Progress Notes (Signed)
 Perioperative / Anesthesia Services  Pre-Admission Testing Clinical Review / Pre-Operative Anesthesia Consult  Date: 01/20/24  Patient Demographics:  Name: Valerie Curry DOB: [birth year] MRN:   [id number]  Planned Surgical Procedure(s):    Case: [id number] Date/Time: 01/23/24 1016   Procedures:      LAPAROSCOPIC ASSISTED VAGINAL HYSTERECTOMY WITH BILATERAL SALPINGO OOPHORECTOMY, A & P REPAIR TOT     ANTERIOR (CYSTOCELE) AND POSTERIOR REPAIR (RECTOCELE)     TOT   Anesthesia type: Choice   Pre-op diagnosis: Pelvic prolapse   Location: [facility] OR ROOM 05 / [facility]   Surgeons: Linzie Collin, MD      NOTE: Available PAT nursing documentation and vital signs have been reviewed. Clinical nursing staff has updated patient's PMH/PSHx, current medication list, and drug allergies/intolerances to ensure comprehensive history available to assist in medical decision making as it pertains to the aforementioned surgical procedure and anticipated anesthetic course. Extensive review of available clinical information personally performed. Valerie Curry PMH and PSHx updated with any diagnoses/procedures that  may have been inadvertently omitted during his intake with the pre-admission testing department's nursing staff.  Clinical Discussion:  Valerie Curry is a 78 y.o. female who is submitted for pre-surgical anesthesia review and clearance prior to her undergoing the above procedure. Patient is a Former Smoker (50 pack years; quit 11/2014). Pertinent PMH includes: CAD, NICM, HFrEF, aortic atherosclerosis, LBBB, HTN, HLD, multinodular thyroid, hiatal hernia, OA, lumbar spinal stenosis, uterine prolapse, umbilical hernia.  Patient is followed by cardiology Azucena Cecil, MD). She was last seen in the cardiology clinic on 01/18/2023; notes reviewed. At the time of her clinic visit, patient doing well overall from a cardiovascular perspective. Patient denied any chest pain, shortness of  breath, PND, orthopnea, palpitations, significant peripheral edema, weakness, fatigue, vertiginous symptoms, or presyncope/syncope. Patient with a past medical history significant for cardiovascular diagnoses. Documented physical exam was grossly benign, providing no evidence of acute exacerbation and/or decompensation of the patient's known cardiovascular conditions.  Patient underwent diagnostic LEFT heart catheterization on 03/17/2022 revealing mild nonobstructive coronary artery disease; 10% ostial to proximal LAD, 15% proximal LAD, and 20% mid RCA.  Given the nonobstructive nature of her disease, no intervention was deemed necessary.  Recommendations were for medical therapy of patient's known nonischemic cardiomyopathy.  Cardiac MRI performed on 05/04/2023 revealing a moderately reduced left ventricular systolic function with an EF of 43%.  There was global hypokinesis.  Septal motion abnormality consistent with known left bundle branch block.  There was no LGE in the left ventricular myocardium to suggest infarct, ischemia, scar, or infiltrative disease.  Aortic root, ascending aorta, and pulmonary artery all within normal size range.  There was mild aortic valve regurgitation.  Again, findings consistent with nonischemic cardiomyopathy.  Most recent TTE was performed on 08/11/2023 revealing a moderately reduced left ventricular systolic function with an EF of 35-40%.  There was global hypokinesis. Left ventricular diastolic Doppler parameters consistent with abnormal relaxation (G1DD).  Right ventricular size and function normal.  RVSP = 30.8 mmHg.  There was trivial aortic valve regurgitation. All transvalvular gradients were noted to be normal providing no evidence suggestive of valvular stenosis. Aorta normal in size with no evidence of ectasia or aneurysmal dilatation.  Patient on GDMT for her NICM and resulting HFrEF.  Patient well compensated on currently prescribed beta-blocker (carvedilol),  diuretic (furosemide + spironolactone), and ARB/ANRI (Entresto) therapies.  Blood pressure well-controlled 128/75 on the aforementioned interventions.  She is not diabetic.  In the  setting of known cardiovascular disease, patient is also on an SGLT2i (empagliflozin) for added cardiovascular and renovascular protection.  Patient declines intervention for invasive procedure including CRT-D device unless there is a significant drop in her LVEF or she becomes symptomatic.  Patient does not have an OSAH diagnosis. Patient is able to complete all of her  ADL/IADLs without cardiovascular limitation.  Per the [facility], patient is able to achieve at least 4 METS of physical activity without experiencing any significant degree of angina/anginal equivalent symptoms. No changes were made to her medication regimen during her visit with cardiology.  Patient scheduled to follow-up with outpatient cardiology in 2 months or sooner if needed.  Valerie Curry is scheduled for an elective LAPAROSCOPIC ASSISTED VAGINAL HYSTERECTOMY WITH BILATERAL SALPINGO OOPHORECTOMY, A & P REPAIR TOT ANTERIOR (CYSTOCELE) AND POSTERIOR REPAIR (RECTOCELE) TOT on 01/23/2024 with Dr. Brennan Bailey, MD.  Given patient's past medical history significant for cardiovascular diagnoses, presurgical cardiac clearance was sought by the PAT team.  Per cardiology, "Valerie Curry is okay to proceed with planned surgical intervention.  She is at 6% risk of cardiac event by RCRI from a cardiac standpoint for the upcoming procedure.  Performing physician should determine which medications should be held during surgery".  In review of her medication reconciliation, the patient is not noted to be taking any type of anticoagulation or antiplatelet therapies that would need to be held during her perioperative course.  Patient reports previous perioperative complications with anesthesia in the past. Patient has a PMH (+) for PONV. Symptoms and history of PONV will be  discussed with patient by anesthesia team on the day of her procedure. Interventions will be ordered as deemed necessary based on patient's individual care needs as determined by anesthesiologist.  Additionally, patient has experienced (+) delayed emergence from anesthesia in the past. In review her EMR, it is noted that patient underwent a MAC anesthetic course at [facility] in 06/2023 without documented complications.      01/13/2024    1:15 PM 12/29/2023    1:05 PM 12/27/2023    9:31 AM  Vitals with BMI  Height 5\' 2"  5\' 2"  5\' 2"   Weight 170 lbs 176 lbs 14 oz 173 lbs 4 oz  BMI 31.09 32.35 31.68  Systolic  101 120  Diastolic  70 74  Pulse  130 73   Providers/Specialists:  NOTE: Primary physician provider listed below. Patient may have been seen by APP or partner within same practice.   PROVIDER ROLE / SPECIALTY LAST Ross Ludwig, MD OB/GYN (Surgeon) 12/29/2023  Allegra Grana, FNP Primary Care Provider 12/27/2023  Debbe Odea, MD Cardiology 09/08/2023  Dorthula Nettles, DO  Advanced Heart Failure 11/18/2023   Allergies:  No Known Allergies Current Home Medications:   No current facility-administered medications for this encounter.    acetaminophen (TYLENOL) 500 MG tablet   Calcium Citrate-Vitamin D (CALCIUM CITRATE+D3 PO)   carvedilol (COREG) 3.125 MG tablet   dapagliflozin propanediol (FARXIGA) 10 MG TABS tablet   estradiol (ESTRACE VAGINAL) 0.1 MG/GM vaginal cream   fluticasone (FLONASE) 50 MCG/ACT nasal spray   furosemide (LASIX) 20 MG tablet   gabapentin (NEURONTIN) 100 MG capsule   loratadine (CLARITIN) 10 MG tablet   meloxicam (MOBIC) 15 MG tablet   methocarbamol (ROBAXIN-750) 750 MG tablet   Multiple Vitamins-Minerals (PRESERVISION AREDS 2 PO)   psyllium (METAMUCIL) 58.6 % packet   rosuvastatin (CRESTOR) 20 MG tablet   sacubitril-valsartan (ENTRESTO) 49-51 MG  spironolactone (ALDACTONE) 25 MG tablet   traMADol (ULTRAM) 50  MG tablet   ibuprofen (ADVIL) 200 MG tablet   metroNIDAZOLE (FLAGYL) 500 MG tablet   naproxen (NAPROSYN) 250 MG tablet   History:   Past Medical History:  Diagnosis Date   Acute gastric erosion    Arthritis    Atherosclerosis of aorta (HCC) 12/13/2018   Benign neoplasm of descending colon    Bursitis of hip 04/17/2020   CAD (coronary artery disease) 12/07/2018   a.) LDCT chest 12/07/2018: calcifications in the LAD and RCA territories; b.) LHC 03/17/2022: 10% o-pLAD, 15% pLAD, 20% mRCA --> med mgmt   Calculus of gallbladder without cholecystitis without obstruction 09/20/2019   Car sickness    Complication of anesthesia    a.) delayed emergence   Heart failure with improved ejection fraction (HFimpEF) (HCC)    HFrEF (heart failure with reduced ejection fraction) (HCC) 02/2022   a.) TTE 03/09/2022: EF 30-35%, glob HK, mild LVH, mild LV dil, G1DD; b.) TTE 03/31/2023: EF 30-35%, glob HK, mild LV dil, G1DD, RVSP 35.2, mild MR/PR; c.) cMRI 05/04/2023: EF 43%; d.) TTE 08/11/2023: EF 35-40%, glob HK, mild LV dil, G1DD, RVSP 30.8, triv AR   History of hiatal hernia    HLD (hyperlipidemia) 11/17/2018   Hypertension    Left bundle branch block    Lump or mass in breast (benign) 2011/2012   NICM (nonischemic cardiomyopathy) (HCC)    a.) TTE 03/09/2022: EF 30-35%; b.) TTE 03/31/2023: EF 30-35%; c.) cMRI 05/04/2023: EF 43%, no LGE to suggest ischemia/infarct/scar/infiltrative disease;  d.) TTE 08/11/2023: EF 35-40%   Obesity, unspecified    Osteopenia    a.) on oral bisphosphonate (Boniva) x 1.5 years; declines continued therapy   Osteoporosis 06/27/2007   Personal history of tobacco use, presenting hazards to health    PONV (postoperative nausea and vomiting)    Positive PPD as a child    a.) diagnosic radiographs of the chest were negative; not consistent with findings of TB   Spinal stenosis, lumbar region with neurogenic claudication 04/30/2015   Systemic sclerosis (HCC)    Thyroid  nodule 12/30/2020   Umbilical hernia without obstruction and without gangrene    Uterine prolapse 11/17/2018   Past Surgical History:  Procedure Laterality Date   APPENDECTOMY  1961   BREAST BIOPSY Right 09/2010   benign   BREAST BIOPSY Left 09/2011   dilated ducts-benign   BUNIONECTOMY Bilateral 2010   x 2   CATARACT EXTRACTION W/PHACO Right 06/21/2023   Procedure: CATARACT EXTRACTION PHACO AND INTRAOCULAR LENS PLACEMENT (IOC) RIGHT 21.59 01:32.9;  Surgeon: Galen Manila, MD;  Location[facility];  Service: Ophthalmology;  Laterality: Right;   CATARACT EXTRACTION W/PHACO Left 07/05/2023   Procedure: CATARACT EXTRACTION PHACO AND INTRAOCULAR LENS PLACEMENT (IOC) LEFT 14.23 1:08.3;  Surgeon: Galen Manila, MD;  Location: [facility];  Service: Ophthalmology;  Laterality: Left;   CHOLECYSTECTOMY N/A 10/03/2019   Procedure: LAPAROSCOPIC CHOLECYSTECTOMY;  Surgeon: Duanne Guess, MD;  Location: [facility] ORS;  Service: General;  Laterality: N/A;   COLONOSCOPY  06/2004   Dr. Mechele Collin   COLONOSCOPY WITH PROPOFOL N/A 01/10/2019   Procedure: COLONOSCOPY WITH PROPOFOL;  Surgeon: Pasty Spillers, MD;  Location: [facility] END[facility];  Service: Endoscopy;  Laterality: N/A;   DECOMPRESSIVE LUMBAR LAMINECTOMY LEVEL 2 N/A 04/30/2015   Procedure: CENTRAL DECOMPRESSIVE LUMBER LAMINECTOMY WITH POSSIBLE FORAMINOTOMIES L3-L4,L4-L5;  Surgeon: Ranee Gosselin, MD;  Location: [facility];  Service: Orthopedics;  Laterality: N/A;   ERCP W/ SPHICTEROTOMY  ESOPHAGOGASTRODUODENOSCOPY (EGD) WITH PROPOFOL N/A 04/07/2021   Procedure: ESOPHAGOGASTRODUODENOSCOPY (EGD) WITH PROPOFOL;  Surgeon: Pasty Spillers, MD;  Location: [facility];  Service: Endoscopy;  Laterality: N/A;   LEFT HEART CATH AND CORONARY ANGIOGRAPHY Left 03/17/2022   Procedure: LEFT HEART CATH AND CORONARY ANGIOGRAPHY;  Surgeon: Yvonne Kendall, MD;  Location: [facility] INVASIVE CV [facility];  Service: Cardiovascular;  Laterality:  Left;   POLYPECTOMY  04/07/2021   Procedure: POLYPECTOMY;  Surgeon: Pasty Spillers, MD;  Location: [facility];  Service: Endoscopy;;   TONSILLECTOMY  1968   TUBAL LIGATION  1974   Family History  Problem Relation Age of Onset   Lung cancer Father    Heart disease Father 90       Cardiac Arrest   Heart disease Mother    Hypertension Mother    Stroke Mother    Other Brother        blood clots   Other Other        hypercoag-family history   Cancer Maternal Aunt        Breast cancer   Breast cancer Maternal Aunt 3   Breast cancer Paternal Aunt 61   Breast cancer Cousin    Social History   Tobacco Use   Smoking status: Former    Current packs/day: 0.00    Average packs/day: 1 pack/day for 50.0 years (50.0 ttl pk-yrs)    Types: Cigarettes    Start date: 11/29/1964    Quit date: 11/29/2014    Years since quitting: 9.1   Smokeless tobacco: Former    Quit date: 04/30/2015  Substance Use Topics   Alcohol use: Yes    Comment: occasional glass of wine   Pertinent Clinical Results:  LABS:  Lab Results  Component Value Date   WBC 4.6 01/17/2024   HGB 13.2 01/17/2024   HCT 39.9 01/17/2024   MCV 94.1 01/17/2024   PLT 203 01/17/2024   Lab Results  Component Value Date   NA 139 11/18/2023   CL 105 11/18/2023   K 4.0 11/18/2023   CO2 27 11/18/2023   BUN 20 11/18/2023   CREATININE 0.75 11/18/2023   GFRNONAA >60 11/18/2023   CALCIUM 9.0 11/18/2023   ALBUMIN 3.8 11/18/2023   GLUCOSE 90 11/18/2023    ECG: Date: 11/18/2023  Time ECG obtained: 1145 AM Rate: 71 bpm Rhythm:  SR with marked SA; LBBB Axis (leads I and aVF): left Intervals: PR 178 ms. QRS 148 ms. QTc 467 ms. ST segment and T wave changes: No evidence of acute T wave abnormalities or significant ST segment elevation or depression.  Evidence of a possible, age undetermined, prior infarct:  No Comparison: Similar to previous tracing obtained on 04/07/2023   IMAGING / PROCEDURES: TRANSTHORACIC  ECHOCARDIOGRAM performed on 08/11/2023 Left ventricular ejection fraction, by estimation, is 35 to 40%. The left ventricle has moderately decreased function. The left ventricle demonstrates global hypokinesis. The left ventricular internal cavity size was mildly dilated. Left ventricular diastolic parameters are consistent with Grade I diastolic dysfunction (impaired relaxation).  Right ventricular systolic function is normal. The right ventricular size is normal. There is normal pulmonary artery systolic pressure. The estimated right ventricular systolic pressure is 30.8 mmHg.  The mitral valve is normal in structure. No evidence of mitral valve regurgitation. No evidence of mitral stenosis.  The aortic valve is tricuspid. Aortic valve regurgitation is trivial. No aortic stenosis is present.  The inferior vena cava is normal in size with greater than 50% respiratory variability,  suggesting right atrial pressure of 3 mmHg.   MR CARDIAC MORPHOLOGY W WO CONTRAST performed on 05/04/2023 Normal LV size, moderately reduced LV function, LVEF 43%. There is no late gadolinium enhancement in the left ventricular myocardium. There is no evidence for myocardial infiltration. Normal RV size and function. Findings consistent with non ischemic cardiomyopathy.   CT CHEST LUNG CA SCREEN LOW DOSE W/O CM performed on 02/01/2023 Lung-RADS 2S, benign appearance or behavior. Continue annual screening with low-dose chest CT without contrast in 12 months. The "S" modifier above refers to potentially clinically significant non lung cancer related findings. Specifically, there is aortic atherosclerosis, in addition to two-vessel coronary artery disease. Please note that although the presence of coronary artery calcium documents the presence of coronary artery disease, the severity of this disease and any potential stenosis cannot be assessed on this non-gated CT examination. Assessment for potential risk factor modification,  dietary therapy or pharmacologic therapy may be warranted, if clinically indicated Mild diffuse bronchial wall thickening with very mild centrilobular and paraseptal emphysema; imaging findings suggestive of underlying COPD. Aortic atherosclerosis    LEFT HEART CATHETERIZATION AND CORONARY ANGIOGRAPHY performed on 03/17/2022 Mild, non-obstructive coronary artery disease with up to 20% stenosis involving the proximal LAD and mid RCA. Upper normal left ventricular filling pressure (LVEDP 15 mmHg). Recommendations: Continue optimization of goal-directed medical therapy for treatment of non-ischemic cardiomyopathy per Dr. Azucena Cecil. Medical therapy and risk factor modification to prevent progression of mild coronary artery disease.  Impression and Plan:  ASENATH BALASH has been referred for pre-anesthesia review and clearance prior to her undergoing the planned anesthetic and procedural courses. Available labs, pertinent testing, and imaging results were personally reviewed by me in preparation for upcoming operative/procedural course. [facility] medical record has been updated following extensive record review and patient interview with PAT staff.   This patient has been appropriately cleared by cardiology with an overall ACCEPTABLE risk of experiencing significant perioperative cardiovascular complications. Based on clinical review performed today (01/20/24), barring any significant acute changes in the patient's overall condition, it is anticipated that she will be able to proceed with the planned surgical intervention. Any acute changes in clinical condition may necessitate her procedure being postponed and/or cancelled. Patient will meet with anesthesia team (MD and/or CRNA) on the day of her procedure for preoperative evaluation/assessment. Questions regarding anesthetic course will be fielded at that time.   Pre-surgical instructions were reviewed with the patient during his PAT appointment,  and questions were fielded to satisfaction by PAT clinical staff. She has been instructed on which medications that she will need to hold prior to surgery, as well as the ones that have been deemed safe/appropriate to take on the day of his procedure. As part of the general education provided by PAT, patient made aware both verbally and in writing, that she would need to abstain from the use of any illegal substances during his perioperative course. She was advised that failure to follow the provided instructions could necessitate case cancellation or result in serious perioperative complications up to and including death. Patient encouraged to contact PAT and/or her surgeon's office to discuss any questions or concerns that may arise prior to surgery; verbalized understanding.   Quentin Mulling, MSN, APRN, FNP-C, CEN [facility]  Perioperative Services Nurse Practitioner Phone: [phone] Fax: [phone] 01/20/24 10:03 AM  NOTE: This note has been prepared using Dragon dictation software. Despite my best ability to proofread, there is always the potential that unintentional transcriptional  errors may still occur from this process.

## 2024-01-20 NOTE — Telephone Encounter (Signed)
-----   Message from Verlee Monte sent at 01/13/2024  2:51 PM EST ----- Regarding: Request for pre-operative cardiac clearance Request for pre-operative cardiac clearance:  1. What type of surgery is being performed?  LAPAROSCOPIC ASSISTED VAGINAL HYSTERECTOMY WITH BILATERAL SALPINGO OOPHORECTOMY, A & P REPAIR TOT ANTERIOR (CYSTOCELE) AND POSTERIOR REPAIR (RECTOCELE) TOT   2. When is this surgery scheduled?  01/23/2024  3. Type of clearance being requested (medical, pharmacy, both)? MEDICAL   4. Are there any medications that need to be held prior to surgery? NONE  5. Practice name and name of physician performing surgery?  Performing surgeon: Dr. Brennan Bailey, MD Requesting clearance: Quentin Mulling, FNP-C    6. Anesthesia type (none, local, MAC, general)? GENERAL  7. What is the office phone and fax number?   Fax: [phone]  ATTENTION: Unable to create telephone message as per your standard workflow. Directed by [facility] providers to send requests for cardiac clearance to this pool for appropriate distribution to provider covering pre-operative clearances.   Quentin Mulling, MSN, APRN, FNP-C, CEN [facility]  Peri-operative Services Nurse Practitioner Phone: [phone] 01/13/24 2:51 PM

## 2024-01-20 NOTE — Telephone Encounter (Signed)
 Date of last appt:  11/18/23 Next appt:  02/20/24   1. What type of surgery is being performed?  LAPAROSCOPIC ASSISTED VAGINAL HYSTERECTOMY WITH BILATERAL SALPINGO OOPHORECTOMY, A & P REPAIR TOT ANTERIOR (CYSTOCELE) AND POSTERIOR REPAIR (RECTOCELE) TOT   2. When is this surgery scheduled?  01/23/2024    3. Type of clearance being requested (medical, pharmacy, both)?  MEDICAL    4. Are there any medications that need to be held prior to surgery?  NONE   5. Practice name and name of physician performing surgery?  Performing surgeon: Dr. Brennan Bailey, MD  Requesting clearance: Quentin Mulling, FNP-C     6. Anesthesia type (none, local, MAC, general)?  GENERAL   7. What is the office phone and fax number?   Fax: [phone]

## 2024-01-23 ENCOUNTER — Ambulatory Visit: Payer: PPO | Admitting: Urgent Care

## 2024-01-23 ENCOUNTER — Encounter: Payer: Self-pay | Admitting: Obstetrics and Gynecology

## 2024-01-23 ENCOUNTER — Other Ambulatory Visit: Payer: Self-pay

## 2024-01-23 ENCOUNTER — Encounter
Admission: RE | Disposition: A | Discharge status: Home / Self Care | Payer: Self-pay | Source: Ambulatory Visit | Attending: Obstetrics and Gynecology

## 2024-01-23 ENCOUNTER — Ambulatory Visit
Admission: RE | Admit: 2024-01-23 | Discharge: 2024-01-23 | Disposition: A | Discharge status: Home / Self Care | Payer: PPO | Source: Ambulatory Visit | Attending: Obstetrics and Gynecology | Admitting: Obstetrics and Gynecology

## 2024-01-23 DIAGNOSIS — N736 Female pelvic peritoneal adhesions (postinfective): Secondary | ICD-10-CM | POA: Insufficient documentation

## 2024-01-23 DIAGNOSIS — Z87891 Personal history of nicotine dependence: Secondary | ICD-10-CM | POA: Diagnosis not present

## 2024-01-23 DIAGNOSIS — N882 Stricture and stenosis of cervix uteri: Secondary | ICD-10-CM | POA: Insufficient documentation

## 2024-01-23 DIAGNOSIS — N879 Dysplasia of cervix uteri, unspecified: Secondary | ICD-10-CM | POA: Diagnosis not present

## 2024-01-23 DIAGNOSIS — I700 Atherosclerosis of aorta: Secondary | ICD-10-CM | POA: Diagnosis not present

## 2024-01-23 DIAGNOSIS — N813 Complete uterovaginal prolapse: Secondary | ICD-10-CM | POA: Insufficient documentation

## 2024-01-23 DIAGNOSIS — N393 Stress incontinence (female) (male): Secondary | ICD-10-CM

## 2024-01-23 DIAGNOSIS — M1990 Unspecified osteoarthritis, unspecified site: Secondary | ICD-10-CM | POA: Insufficient documentation

## 2024-01-23 DIAGNOSIS — K449 Diaphragmatic hernia without obstruction or gangrene: Secondary | ICD-10-CM | POA: Diagnosis not present

## 2024-01-23 DIAGNOSIS — I351 Nonrheumatic aortic (valve) insufficiency: Secondary | ICD-10-CM | POA: Diagnosis not present

## 2024-01-23 DIAGNOSIS — N888 Other specified noninflammatory disorders of cervix uteri: Secondary | ICD-10-CM | POA: Insufficient documentation

## 2024-01-23 DIAGNOSIS — I428 Other cardiomyopathies: Secondary | ICD-10-CM | POA: Diagnosis not present

## 2024-01-23 DIAGNOSIS — D251 Intramural leiomyoma of uterus: Secondary | ICD-10-CM | POA: Diagnosis not present

## 2024-01-23 DIAGNOSIS — K219 Gastro-esophageal reflux disease without esophagitis: Secondary | ICD-10-CM | POA: Diagnosis not present

## 2024-01-23 DIAGNOSIS — I447 Left bundle-branch block, unspecified: Secondary | ICD-10-CM | POA: Diagnosis not present

## 2024-01-23 DIAGNOSIS — I110 Hypertensive heart disease with heart failure: Secondary | ICD-10-CM | POA: Insufficient documentation

## 2024-01-23 DIAGNOSIS — G709 Myoneural disorder, unspecified: Secondary | ICD-10-CM | POA: Diagnosis not present

## 2024-01-23 DIAGNOSIS — Z01812 Encounter for preprocedural laboratory examination: Secondary | ICD-10-CM

## 2024-01-23 DIAGNOSIS — I5042 Chronic combined systolic (congestive) and diastolic (congestive) heart failure: Secondary | ICD-10-CM | POA: Diagnosis not present

## 2024-01-23 DIAGNOSIS — I2510 Atherosclerotic heart disease of native coronary artery without angina pectoris: Secondary | ICD-10-CM | POA: Diagnosis not present

## 2024-01-23 DIAGNOSIS — N8189 Other female genital prolapse: Secondary | ICD-10-CM | POA: Diagnosis not present

## 2024-01-23 DIAGNOSIS — I5020 Unspecified systolic (congestive) heart failure: Secondary | ICD-10-CM | POA: Diagnosis not present

## 2024-01-23 DIAGNOSIS — E785 Hyperlipidemia, unspecified: Secondary | ICD-10-CM | POA: Diagnosis not present

## 2024-01-23 DIAGNOSIS — Z8711 Personal history of peptic ulcer disease: Secondary | ICD-10-CM | POA: Insufficient documentation

## 2024-01-23 HISTORY — PX: BLADDER SUSPENSION: SHX72

## 2024-01-23 HISTORY — PX: LAPAROSCOPIC VAGINAL HYSTERECTOMY WITH SALPINGO OOPHORECTOMY: SHX6681

## 2024-01-23 HISTORY — DX: Other cardiomyopathies: I42.8

## 2024-01-23 HISTORY — PX: ANTERIOR AND POSTERIOR REPAIR: SHX5121

## 2024-01-23 SURGERY — HYSTERECTOMY, VAGINAL, LAPAROSCOPY-ASSISTED, WITH SALPINGO-OOPHORECTOMY
Anesthesia: General

## 2024-01-23 MED ORDER — CEFAZOLIN SODIUM-DEXTROSE 2-4 GM/100ML-% IV SOLN
INTRAVENOUS | Status: AC
Start: 2024-01-23 — End: ?
  Filled 2024-01-23: qty 100

## 2024-01-23 MED ORDER — FENTANYL CITRATE (PF) 100 MCG/2ML IJ SOLN
25.0000 ug | INTRAMUSCULAR | Status: DC | PRN
  Administered 2024-01-23: 25 ug via INTRAVENOUS

## 2024-01-23 MED ORDER — SEVOFLURANE IN SOLN
RESPIRATORY_TRACT | Status: AC
  Filled 2024-01-23: qty 250

## 2024-01-23 MED ORDER — LACTATED RINGERS IV SOLN: INTRAVENOUS | Status: AC

## 2024-01-23 MED ORDER — OXYCODONE HCL 5 MG/5ML PO SOLN: 5.0000 mg | Freq: Once | ORAL | Status: DC | PRN

## 2024-01-23 MED ORDER — CELECOXIB 200 MG PO CAPS
ORAL_CAPSULE | ORAL | Status: AC
  Filled 2024-01-23: qty 1

## 2024-01-23 MED ORDER — DEXAMETHASONE SODIUM PHOSPHATE 10 MG/ML IJ SOLN
INTRAMUSCULAR | Status: DC | PRN
  Administered 2024-01-23: 10 mg via INTRAVENOUS

## 2024-01-23 MED ORDER — EPHEDRINE 5 MG/ML INJ
INTRAVENOUS | Status: AC
  Filled 2024-01-23: qty 5

## 2024-01-23 MED ORDER — ROCURONIUM BROMIDE 10 MG/ML (PF) SYRINGE
PREFILLED_SYRINGE | INTRAVENOUS | Status: AC
Start: 2024-01-23 — End: ?
  Filled 2024-01-23: qty 10

## 2024-01-23 MED ORDER — ONDANSETRON 4 MG PO TBDP: 4.0000 mg | ORAL_TABLET | Freq: Four times a day (QID) | ORAL | Status: DC | PRN

## 2024-01-23 MED ORDER — LACTATED RINGERS IV SOLN: INTRAVENOUS | Status: DC | PRN

## 2024-01-23 MED ORDER — ONDANSETRON HCL 4 MG/2ML IJ SOLN: 4.0000 mg | Freq: Four times a day (QID) | INTRAMUSCULAR | Status: DC | PRN

## 2024-01-23 MED ORDER — PHENYLEPHRINE 80 MCG/ML (10ML) SYRINGE FOR IV PUSH (FOR BLOOD PRESSURE SUPPORT)
PREFILLED_SYRINGE | INTRAVENOUS | Status: DC | PRN
  Administered 2024-01-23 (×4): 160 ug via INTRAVENOUS
  Administered 2024-01-23: 80 ug via INTRAVENOUS

## 2024-01-23 MED ORDER — CHLORHEXIDINE GLUCONATE 0.12 % MT SOLN
15.0000 mL | Freq: Once | OROMUCOSAL | Status: AC
  Administered 2024-01-23: 15 mL via OROMUCOSAL

## 2024-01-23 MED ORDER — EPHEDRINE SULFATE-NACL 50-0.9 MG/10ML-% IV SOSY
PREFILLED_SYRINGE | INTRAVENOUS | Status: DC | PRN
  Administered 2024-01-23: 10 mg via INTRAVENOUS

## 2024-01-23 MED ORDER — PHENYLEPHRINE 80 MCG/ML (10ML) SYRINGE FOR IV PUSH (FOR BLOOD PRESSURE SUPPORT)
PREFILLED_SYRINGE | INTRAVENOUS | Status: AC
  Filled 2024-01-23: qty 20

## 2024-01-23 MED ORDER — SUGAMMADEX SODIUM 200 MG/2ML IV SOLN
INTRAVENOUS | Status: DC | PRN
  Administered 2024-01-23: 200 mg via INTRAVENOUS

## 2024-01-23 MED ORDER — ORAL CARE MOUTH RINSE: 15.0000 mL | Freq: Once | OROMUCOSAL | Status: AC

## 2024-01-23 MED ORDER — GABAPENTIN 300 MG PO CAPS
300.0000 mg | ORAL_CAPSULE | ORAL | Status: AC
  Administered 2024-01-23: 300 mg via ORAL

## 2024-01-23 MED ORDER — BUPIVACAINE HCL 0.5 % IJ SOLN
INTRAMUSCULAR | Status: DC | PRN
  Administered 2024-01-23: 16 mL

## 2024-01-23 MED ORDER — ACETAMINOPHEN 500 MG PO TABS
ORAL_TABLET | ORAL | Status: AC
  Filled 2024-01-23: qty 2

## 2024-01-23 MED ORDER — METHYLENE BLUE (ANTIDOTE) 1 % IV SOLN
INTRAVENOUS | Status: AC
  Filled 2024-01-23: qty 10

## 2024-01-23 MED ORDER — SODIUM CHLORIDE (PF) 0.9 % IJ SOLN
INTRAVENOUS | Status: DC | PRN
  Administered 2024-01-23: 50 mL

## 2024-01-23 MED ORDER — CHLORHEXIDINE GLUCONATE 0.12 % MT SOLN
OROMUCOSAL | Status: AC
Start: 2024-01-23 — End: ?
  Filled 2024-01-23: qty 15

## 2024-01-23 MED ORDER — PROPOFOL 1000 MG/100ML IV EMUL
INTRAVENOUS | Status: AC
  Filled 2024-01-23: qty 100

## 2024-01-23 MED ORDER — SUGAMMADEX SODIUM 200 MG/2ML IV SOLN
INTRAVENOUS | Status: AC
  Filled 2024-01-23: qty 2

## 2024-01-23 MED ORDER — PROPOFOL 10 MG/ML IV BOLUS
INTRAVENOUS | Status: DC | PRN
  Administered 2024-01-23: 150 mg via INTRAVENOUS
  Administered 2024-01-23: 100 ug/kg/min via INTRAVENOUS
  Administered 2024-01-23: 50 ug/kg/min via INTRAVENOUS

## 2024-01-23 MED ORDER — GABAPENTIN 300 MG PO CAPS
ORAL_CAPSULE | ORAL | Status: AC
  Filled 2024-01-23: qty 1

## 2024-01-23 MED ORDER — HYDROCODONE-ACETAMINOPHEN 5-325 MG PO TABS
1.0000 | ORAL_TABLET | Freq: Four times a day (QID) | ORAL | 0 refills | Status: DC | PRN
Start: 2024-01-23 — End: 2024-02-20

## 2024-01-23 MED ORDER — SODIUM CHLORIDE (PF) 0.9 % IJ SOLN
INTRAMUSCULAR | Status: AC
  Filled 2024-01-23: qty 50

## 2024-01-23 MED ORDER — KETOROLAC TROMETHAMINE 0.5 % OP SOLN
1.0000 [drp] | Freq: Four times a day (QID) | OPHTHALMIC | Status: DC
  Administered 2024-01-23: 1 [drp] via OPHTHALMIC
  Filled 2024-01-23: qty 3

## 2024-01-23 MED ORDER — MIDAZOLAM HCL 2 MG/2ML IJ SOLN
INTRAMUSCULAR | Status: DC | PRN
  Administered 2024-01-23: 2 mg via INTRAVENOUS

## 2024-01-23 MED ORDER — OXYCODONE HCL 5 MG PO TABS: 5.0000 mg | ORAL_TABLET | Freq: Once | ORAL | Status: DC | PRN

## 2024-01-23 MED ORDER — KETOROLAC TROMETHAMINE 30 MG/ML IJ SOLN
INTRAMUSCULAR | Status: AC
  Filled 2024-01-23: qty 1

## 2024-01-23 MED ORDER — KETOROLAC TROMETHAMINE 30 MG/ML IJ SOLN
30.0000 mg | Freq: Once | INTRAMUSCULAR | Status: AC
  Administered 2024-01-23: 30 mg via INTRAVENOUS

## 2024-01-23 MED ORDER — POVIDONE-IODINE 10 % EX SWAB: 2.0000 | Freq: Once | CUTANEOUS | Status: DC

## 2024-01-23 MED ORDER — DEXAMETHASONE SODIUM PHOSPHATE 10 MG/ML IJ SOLN
INTRAMUSCULAR | Status: AC
  Filled 2024-01-23: qty 1

## 2024-01-23 MED ORDER — OXYCODONE-ACETAMINOPHEN 5-325 MG PO TABS: 1.0000 | ORAL_TABLET | ORAL | Status: DC | PRN

## 2024-01-23 MED ORDER — ONDANSETRON HCL 4 MG/2ML IJ SOLN
INTRAMUSCULAR | Status: AC
Start: 2024-01-23 — End: ?
  Filled 2024-01-23: qty 2

## 2024-01-23 MED ORDER — CELECOXIB 200 MG PO CAPS
400.0000 mg | ORAL_CAPSULE | ORAL | Status: AC
  Administered 2024-01-23: 400 mg via ORAL

## 2024-01-23 MED ORDER — VASOPRESSIN 20 UNIT/ML IV SOLN
INTRAVENOUS | Status: AC
  Filled 2024-01-23: qty 2

## 2024-01-23 MED ORDER — ACETAMINOPHEN 500 MG PO TABS
1000.0000 mg | ORAL_TABLET | ORAL | Status: AC
  Administered 2024-01-23: 1000 mg via ORAL

## 2024-01-23 MED ORDER — ESTROGENS CONJUGATED 0.625 MG/GM VA CREA
TOPICAL_CREAM | VAGINAL | Status: AC
  Filled 2024-01-23: qty 30

## 2024-01-23 MED ORDER — BUPIVACAINE HCL (PF) 0.5 % IJ SOLN
INTRAMUSCULAR | Status: AC
  Filled 2024-01-23: qty 30

## 2024-01-23 MED ORDER — CEFAZOLIN SODIUM-DEXTROSE 2-4 GM/100ML-% IV SOLN
2.0000 g | INTRAVENOUS | Status: AC
  Administered 2024-01-23: 2 g via INTRAVENOUS

## 2024-01-23 MED ORDER — FENTANYL CITRATE (PF) 100 MCG/2ML IJ SOLN
INTRAMUSCULAR | Status: AC
Start: 2024-01-23 — End: ?
  Filled 2024-01-23: qty 2

## 2024-01-23 MED ORDER — ONDANSETRON HCL 4 MG/2ML IJ SOLN
INTRAMUSCULAR | Status: DC | PRN
  Administered 2024-01-23: 4 mg via INTRAVENOUS

## 2024-01-23 MED ORDER — BSS IO SOLN
15.0000 mL | Freq: Once | INTRAOCULAR | Status: AC
  Administered 2024-01-23: 15 mL
  Filled 2024-01-23: qty 15

## 2024-01-23 MED ORDER — DEXTROSE IN LACTATED RINGERS 5 % IV SOLN: INTRAVENOUS | Status: DC

## 2024-01-23 MED ORDER — TETRACAINE HCL 0.5 % OP SOLN
1.0000 [drp] | Freq: Once | OPHTHALMIC | Status: AC
  Administered 2024-01-23: 1 [drp] via OPHTHALMIC
  Filled 2024-01-23: qty 4

## 2024-01-23 MED ORDER — LIDOCAINE HCL (CARDIAC) PF 100 MG/5ML IV SOSY
PREFILLED_SYRINGE | INTRAVENOUS | Status: DC | PRN
  Administered 2024-01-23: 100 mg via INTRAVENOUS

## 2024-01-23 MED ORDER — FENTANYL CITRATE (PF) 100 MCG/2ML IJ SOLN
INTRAMUSCULAR | Status: DC | PRN
  Administered 2024-01-23 (×2): 50 ug via INTRAVENOUS

## 2024-01-23 MED ORDER — VASOPRESSIN 20 UNIT/ML IV SOLN
INTRAVENOUS | Status: DC | PRN
  Administered 2024-01-23 (×2): 9 mL
  Administered 2024-01-23: 7 mL

## 2024-01-23 MED ORDER — MIDAZOLAM HCL 2 MG/2ML IJ SOLN
INTRAMUSCULAR | Status: AC
  Filled 2024-01-23: qty 2

## 2024-01-23 MED ORDER — ROCURONIUM BROMIDE 100 MG/10ML IV SOLN
INTRAVENOUS | Status: DC | PRN
  Administered 2024-01-23: 50 mg via INTRAVENOUS

## 2024-01-23 SURGICAL SUPPLY — 81 items
ADHESIVE MASTISOL STRL (MISCELLANEOUS) IMPLANT
BAG DECANTER FOR FLEXI CONT (MISCELLANEOUS) ×1 IMPLANT
BAG URINE DRAIN 2000ML AR STRL (UROLOGICAL SUPPLIES) ×1 IMPLANT
BAG URINE DRAIN UROCATCH STRL (MISCELLANEOUS) ×1 IMPLANT
BASIN GRAD PLASTIC 32OZ STRL (MISCELLANEOUS) IMPLANT
BASIN KIT SINGLE STR (MISCELLANEOUS) IMPLANT
BLADE SURG 15 STRL LF DISP TIS (BLADE) ×1 IMPLANT
BLADE SURG SZ10 CARB STEEL (BLADE) ×1 IMPLANT
BLADE SURG SZ11 CARB STEEL (BLADE) ×1 IMPLANT
BNDG GAUZE DERMACEA FLUFF 4 (GAUZE/BANDAGES/DRESSINGS) IMPLANT
CATH FOLEY 2WAY SIL 16X30 (CATHETERS) ×1 IMPLANT
CATH URTH 16FR FL 2W BLN LF (CATHETERS) ×1 IMPLANT
CHLORAPREP W/TINT 26 (MISCELLANEOUS) ×1 IMPLANT
CLEANER CAUTERY TIP PAD (MISCELLANEOUS) IMPLANT
DERMABOND ADVANCED .7 DNX12 (GAUZE/BANDAGES/DRESSINGS) ×1 IMPLANT
DRAPE PERI LITHO V/GYN (MISCELLANEOUS) ×1 IMPLANT
DRAPE UNDER BUTTOCK W/FLU (DRAPES) ×1 IMPLANT
DRAPE UTILITY 15X26 TOWEL STRL (DRAPES) ×1 IMPLANT
ELECT CAUTERY BLADE 6.4 (BLADE) ×1 IMPLANT
ELECT REM PT RETURN 9FT ADLT (ELECTROSURGICAL) ×1 IMPLANT
ELECTRODE REM PT RTRN 9FT ADLT (ELECTROSURGICAL) ×1 IMPLANT
GAUZE 4X4 16PLY ~~LOC~~+RFID DBL (SPONGE) ×4 IMPLANT
GAUZE PACK 2X3YD (PACKING) ×1 IMPLANT
GLOVE BIO SURGEON STRL SZ 6.5 (GLOVE) ×1 IMPLANT
GLOVE BIO SURGEON STRL SZ8 (GLOVE) ×1 IMPLANT
GLOVE INDICATOR 7.0 STRL GRN (GLOVE) ×1 IMPLANT
GLOVE PI ORTHO PRO STRL 7.5 (GLOVE) ×2 IMPLANT
GOWN STRL REUS W/ TWL LRG LVL3 (GOWN DISPOSABLE) ×2 IMPLANT
GOWN STRL REUS W/ TWL XL LVL3 (GOWN DISPOSABLE) ×2 IMPLANT
HANDLE YANKAUER SUCT BULB TIP (MISCELLANEOUS) IMPLANT
IRRIGATION STRYKERFLOW (MISCELLANEOUS) IMPLANT
IRRIGATOR STRYKERFLOW (MISCELLANEOUS) IMPLANT
IV LACTATED RINGERS 1000ML (IV SOLUTION) ×1 IMPLANT
KIT PINK PAD W/HEAD ARE REST (MISCELLANEOUS) ×1 IMPLANT
KIT PINK PAD W/HEAD ARM REST (MISCELLANEOUS) ×1 IMPLANT
KIT TURNOVER CYSTO (KITS) ×1 IMPLANT
LIGASURE LAP MARYLAND 5MM 37CM (ELECTROSURGICAL) IMPLANT
MANIFOLD NEPTUNE II (INSTRUMENTS) ×1 IMPLANT
NDL HYPO 22X1.5 SAFETY MO (MISCELLANEOUS) ×1 IMPLANT
NDL SAFETY ECLIPSE 18X1.5 (NEEDLE) ×1 IMPLANT
NDL SPNL 22GX3.5 QUINCKE BK (NEEDLE) ×1 IMPLANT
NEEDLE HYPO 22X1.5 SAFETY MO (MISCELLANEOUS) ×1 IMPLANT
NEEDLE SPNL 22GX3.5 QUINCKE BK (NEEDLE) ×1 IMPLANT
NS IRRIG 1000ML POUR BTL (IV SOLUTION) IMPLANT
NS IRRIG 500ML POUR BTL (IV SOLUTION) ×1 IMPLANT
PACK BASIN MINOR ARMC (MISCELLANEOUS) ×1 IMPLANT
PACK GYN LAPAROSCOPIC (MISCELLANEOUS) ×1 IMPLANT
PAD OB MATERNITY 11 LF (PERSONAL CARE ITEMS) ×1 IMPLANT
PAD PREP OB/GYN DISP 24X41 (PERSONAL CARE ITEMS) ×1 IMPLANT
PENCIL SMOKE EVACUATOR (MISCELLANEOUS) IMPLANT
RETRACTOR PHONTONGUIDE ADAPT (ADAPTER) IMPLANT
SCRUB CHG 4% DYNA-HEX 4OZ (MISCELLANEOUS) ×1 IMPLANT
SET CYSTO W/LG BORE CLAMP LF (SET/KITS/TRAYS/PACK) IMPLANT
SLEEVE Z-THREAD 5X100MM (TROCAR) ×2 IMPLANT
SLING TRANSOBTURATOR OBTRYX (Sling) ×1 IMPLANT
SOL ELECTROSURG ANTI STICK (MISCELLANEOUS) IMPLANT
SOLUTION ELECTROSURG ANTI STCK (MISCELLANEOUS) IMPLANT
SPONGE T-LAP 18X18 ~~LOC~~+RFID (SPONGE) IMPLANT
STRAP SAFETY 5IN WIDE (MISCELLANEOUS) ×1 IMPLANT
STRIP CLOSURE SKIN 1/2X4 (GAUZE/BANDAGES/DRESSINGS) ×1 IMPLANT
SURGILUBE 2OZ TUBE FLIPTOP (MISCELLANEOUS) ×1 IMPLANT
SUT VIC AB 0 CT1 27XCR 8 STRN (SUTURE) ×2 IMPLANT
SUT VIC AB 0 CT1 36 (SUTURE) ×1 IMPLANT
SUT VIC AB 2-0 CT1 (SUTURE) ×2 IMPLANT
SUT VICRYL 0 UR6 27IN ABS (SUTURE) ×2 IMPLANT
SUT VICRYL 4-0 27 PS-2 BARIAT (SUTURE) ×1 IMPLANT
SUT VICRYL+ 3-0 36IN CT-1 (SUTURE) ×1 IMPLANT
SUTURE VICRYL 4-0 27 PS-2 BART (SUTURE) IMPLANT
SYR 10ML LL (SYRINGE) ×2 IMPLANT
SYR 50ML LL SCALE MARK (SYRINGE) IMPLANT
SYR CONTROL 10ML LL (SYRINGE) ×1 IMPLANT
SYR TOOMEY IRRIG 70ML (MISCELLANEOUS) ×1 IMPLANT
SYRINGE TOOMEY IRRIG 70ML (MISCELLANEOUS) ×1 IMPLANT
TAPE TRANSPORE STRL 2 31045 (GAUZE/BANDAGES/DRESSINGS) ×1 IMPLANT
TOWEL OR 17X26 4PK STRL BLUE (TOWEL DISPOSABLE) ×1 IMPLANT
TRAP FLUID SMOKE EVACUATOR (MISCELLANEOUS) ×1 IMPLANT
TROCAR Z THREAD OPTICAL 5X150 (TROCAR) IMPLANT
TROCAR Z-THREAD FIOS 5X100MM (TROCAR) ×1 IMPLANT
TUBING EVAC SMOKE HEATED PNEUM (TUBING) ×1 IMPLANT
WATER STERILE IRR 1000ML POUR (IV SOLUTION) IMPLANT
WATER STERILE IRR 500ML POUR (IV SOLUTION) ×1 IMPLANT

## 2024-01-23 NOTE — Interval H&P Note (Signed)
 History and Physical Interval Note:  01/23/2024 9:34 AM  Valerie Curry  has presented today for surgery, with the diagnosis of Pelvic prolapse.  The various methods of treatment have been discussed with the patient and family. After consideration of risks, benefits and other options for treatment, the patient has consented to  Procedure(s): LAPAROSCOPIC ASSISTED VAGINAL HYSTERECTOMY WITH BILATERAL SALPINGO OOPHORECTOMY, A & P REPAIR TOT (N/A) ANTERIOR (CYSTOCELE) AND POSTERIOR REPAIR (RECTOCELE) (N/A) TOT (N/A) as a surgical intervention.  The patient's history has been reviewed, patient examined, no change in status, stable for surgery.  I have reviewed the patient's chart and labs.  Questions were answered to the patient's satisfaction.     Brennan Bailey

## 2024-01-23 NOTE — Progress Notes (Addendum)
 Called to phase 2 post op by RN due to persistent right eye pain.   Patient underwent general endotracheal anesthetic for laparoscopic hysterectomy surgery and now complains of feeling of foreign body sensation in right eye, exacerbated by eye movement and opening. Denies vision changes. Does not wear contacts.  She had already received toradol eye drops by the anesthesiologist on record for her case in post op.  Advised patient, as well as her son and husband at bedside, the symptoms are consistent with a corneal abrasion and that the vast majority of these improve without intervention with time. I have ordered one drop of tetracaine ophthalmic into affected eye, one time, for symptomatic relief, and advised lubricating eye drops regularly as needed. If no improvement noticed in 24-48 hrs, advised patient to let the medical team know to contact anesthesiology department for potential ophthalmology consult.

## 2024-01-23 NOTE — Discharge Instructions (Signed)
 TOrodol eye drops every 6 hours to right eye.

## 2024-01-23 NOTE — Anesthesia Preprocedure Evaluation (Addendum)
 Anesthesia Evaluation  Patient identified by MRN, date of birth, ID band Patient awake    Reviewed: Allergy & Precautions, NPO status , Patient's Chart, lab work & pertinent test results  History of Anesthesia Complications (+) PONV and history of anesthetic complications  Airway Mallampati: III  TM Distance: <3 FB Neck ROM: full    Dental  (+) Chipped   Pulmonary neg shortness of breath, former smoker   Pulmonary exam normal        Cardiovascular Exercise Tolerance: Good hypertension, + CAD and +CHF  Normal cardiovascular exam+ dysrhythmias      Neuro/Psych  Neuromuscular disease  negative psych ROS   GI/Hepatic Neg liver ROS, hiatal hernia, PUD,GERD  Controlled,,  Endo/Other  negative endocrine ROS    Renal/GU      Musculoskeletal   Abdominal   Peds  Hematology negative hematology ROS (+)   Anesthesia Other Findings Patient has cardiac clearance for this procedure.   Past Medical History: No date: Acute gastric erosion No date: Arthritis 12/13/2018: Atherosclerosis of aorta (HCC) No date: Benign neoplasm of descending colon 04/17/2020: Bursitis of hip 12/07/2018: CAD (coronary artery disease)     Comment:  a.) LDCT chest 12/07/2018: calcifications in the LAD and              RCA territories; b.) LHC 03/17/2022: 10% o-pLAD, 15%               pLAD, 20% mRCA --> med mgmt 09/20/2019: Calculus of gallbladder without cholecystitis without  obstruction No date: Car sickness No date: Complication of anesthesia     Comment:  a.) delayed emergence; b.) PONV 02/2022: HFrEF (heart failure with reduced ejection fraction) (HCC)     Comment:  a.) TTE 03/09/2022: EF 30-35%, glob HK, mild LVH, mild               LV dil, G1DD; b.) TTE 03/31/2023: EF 30-35%, glob HK,               mild LV dil, G1DD, RVSP 35.2, mild MR/PR; c.) cMRI               05/04/2023: EF 43%; d.) TTE 08/11/2023: EF 35-40%, glob               HK,  mild LV dil, G1DD, RVSP 30.8, triv AR 2024: History of bilateral cataract extraction No date: History of hiatal hernia 11/17/2018: HLD (hyperlipidemia) No date: Hypertension No date: Left bundle branch block 2011/2012: Lump or mass in breast (benign) 12/30/2020: Multinodular thyroid No date: NICM (nonischemic cardiomyopathy) (HCC)     Comment:  a.) TTE 03/09/2022: EF 30-35%; b.) TTE 03/31/2023: EF               30-35%; c.) cMRI 05/04/2023: EF 43%, no LGE to suggest               ischemia/infarct/scar/infiltrative disease;  d.) TTE               08/11/2023: EF 35-40% No date: Obesity, unspecified No date: Osteopenia     Comment:  a.) on oral bisphosphonate (Boniva) x 1.5 years;               declines continued therapy 06/27/2007: Osteoporosis No date: Personal history of tobacco use, presenting hazards to health No date: PONV (postoperative nausea and vomiting) No date: Positive PPD as a child     Comment:  a.) diagnosic radiographs of the chest were negative;  not consistent with findings of TB 04/30/2015: Spinal stenosis, lumbar region with neurogenic  claudication No date: Systemic sclerosis (HCC) No date: Umbilical hernia without obstruction and without gangrene 11/17/2018: Uterine prolapse  Past Surgical History: 1961: APPENDECTOMY 09/2010: BREAST BIOPSY; Right     Comment:  benign 09/2011: BREAST BIOPSY; Left     Comment:  dilated ducts-benign 2010: BUNIONECTOMY; Bilateral     Comment:  x 2 06/21/2023: CATARACT EXTRACTION W/PHACO; Right     Comment:  Procedure: CATARACT EXTRACTION PHACO AND INTRAOCULAR               LENS PLACEMENT (IOC) RIGHT 21.59 01:32.9;  Surgeon:               Galen Manila, MD;  Location: [facility];                Service: Ophthalmology;  Laterality: Right; 07/05/2023: CATARACT EXTRACTION W/PHACO; Left     Comment:  Procedure: CATARACT EXTRACTION PHACO AND INTRAOCULAR               LENS PLACEMENT (IOC) LEFT 14.23 1:08.3;   Surgeon:               Galen Manila, MD;  Location: [facility];                Service: Ophthalmology;  Laterality: Left; 10/03/2019: CHOLECYSTECTOMY; N/A     Comment:  Procedure: LAPAROSCOPIC CHOLECYSTECTOMY;  Surgeon:               Duanne Guess, MD;  Location: [facility];  Service:               General;  Laterality: N/A; 06/2004: COLONOSCOPY     Comment:  Dr. Mechele Collin 01/10/2019: COLONOSCOPY WITH PROPOFOL; N/A     Comment:  Procedure: COLONOSCOPY WITH PROPOFOL;  Surgeon:               Pasty Spillers, MD;  Location: [facility];                Service: Endoscopy;  Laterality: N/A; 04/30/2015: DECOMPRESSIVE LUMBAR LAMINECTOMY LEVEL 2; N/A     Comment:  Procedure: CENTRAL DECOMPRESSIVE LUMBER LAMINECTOMY WITH              POSSIBLE FORAMINOTOMIES L3-L4,L4-L5;  Surgeon: Ranee Gosselin, MD;  Location: [facility];  Service: Orthopedics;                Laterality: N/A; No date: ERCP W/ SPHICTEROTOMY 04/07/2021: ESOPHAGOGASTRODUODENOSCOPY (EGD) WITH PROPOFOL; N/A     Comment:  Procedure: ESOPHAGOGASTRODUODENOSCOPY (EGD) WITH               PROPOFOL;  Surgeon: Pasty Spillers, MD;  Location:               [facility];  Service: Endoscopy;  Laterality:               N/A; 03/17/2022: LEFT HEART CATH AND CORONARY ANGIOGRAPHY; Left     Comment:  Procedure: LEFT HEART CATH AND CORONARY ANGIOGRAPHY;                Surgeon: Yvonne Kendall, MD;  Location: [facility] IN[facility]               CV [facility];  Service: Cardiovascular;  Laterality: Left; 04/07/2021: POLYPECTOMY     Comment:  Procedure: POLYPECTOMY;  Surgeon: Pasty Spillers,  MD;  Location: [facility];  Service: Endoscopy;; 1968: TONSILLECTOMY 1974: TUBAL LIGATION     Reproductive/Obstetrics negative OB ROS                             Anesthesia Physical Anesthesia Plan  ASA: 3  Anesthesia Plan: General ETT   Post-op Pain Management:     Induction: Intravenous  PONV Risk Score and Plan: Ondansetron, Dexamethasone, Midazolam, Treatment may vary due to age or medical condition and Propofol infusion  Airway Management Planned: Oral ETT and Video Laryngoscope Planned  Additional Equipment:   Intra-op Plan:   Post-operative Plan: Extubation in OR  Informed Consent: I have reviewed the patients History and Physical, chart, labs and discussed the procedure including the risks, benefits and alternatives for the proposed anesthesia with the patient or authorized representative who has indicated his/her understanding and acceptance.     Dental Advisory Given  Plan Discussed with: Anesthesiologist, CRNA and Surgeon  Anesthesia Plan Comments: (Patient consented for risks of anesthesia including but not limited to:  - adverse reactions to medications - damage to eyes, teeth, lips or other oral mucosa - nerve damage due to positioning  - sore throat or hoarseness - Damage to heart, brain, nerves, lungs, other parts of body or loss of life  Patient voiced understanding and assent.)       Anesthesia Quick Evaluation

## 2024-01-23 NOTE — Anesthesia Procedure Notes (Signed)
 Procedure Name: Intubation Date/Time: 01/23/2024 10:27 AM  Performed by: Lysbeth Penner, CRNAPre-anesthesia Checklist: Patient identified, Emergency Drugs available, Suction available and Patient being monitored Patient Re-evaluated:Patient Re-evaluated prior to induction Oxygen Delivery Method: Circle system utilized Preoxygenation: Pre-oxygenation with 100% oxygen Induction Type: IV induction Ventilation: Mask ventilation without difficulty Laryngoscope Size: McGrath and 3 Grade View: Grade I Tube type: Oral Tube size: 6.5 mm Number of attempts: 1 Airway Equipment and Method: Stylet and Oral airway Placement Confirmation: ETT inserted through vocal cords under direct vision, positive ETCO2 and breath sounds checked- equal and bilateral Secured at: 20 cm Tube secured with: Tape Dental Injury: Teeth and Oropharynx as per pre-operative assessment

## 2024-01-23 NOTE — Transfer of Care (Signed)
 Immediate Anesthesia Transfer of Care Note  Patient: Valerie Curry  Procedure(s) Performed: LAPAROSCOPIC ASSISTED VAGINAL HYSTERECTOMY WITH BILATERAL SALPINGO OOPHORECTOMY, A & P REPAIR TOT ANTERIOR (CYSTOCELE) AND POSTERIOR REPAIR (RECTOCELE) TOT  Patient Location: PACU  Anesthesia Type:General  Level of Consciousness: sedated  Airway & Oxygen Therapy: Patient Spontanous Breathing  Post-op Assessment: Report given to RN and Post -op Vital signs reviewed and stable  Post vital signs: Reviewed and stable  Last Vitals:  Vitals Value Taken Time  BP 118/58 01/23/24 1335  Temp    Pulse 68 01/23/24 1339  Resp 16 01/23/24 1339  SpO2 96 % 01/23/24 1339  Vitals shown include unfiled device data.  Last Pain:  Vitals:   01/23/24 0928  TempSrc: Temporal  PainSc: 0-No pain         Complications: There were no known notable events for this encounter.

## 2024-01-23 NOTE — Progress Notes (Signed)
 Patient was administered the tetracaine in the right eye due to persistent eye pain. Immediately the patient was able to open her eye stated that the pain had subsided. She was discharged home and educated to rest the eye apply the ketorolac drops every 6 hours for 24 hours to decrease eye pain. The Son and husband verbalized understanding.

## 2024-01-23 NOTE — Op Note (Signed)
 OPERATIVE NOTE 01/23/2024 1:57 PM  PRE-OPERATIVE DIAGNOSIS:  1) Pelvic prolapse -complete procidentia  POST-OPERATIVE DIAGNOSIS:  1) Same  OPERATION: Procedure(s) (LRB): LAPAROSCOPIC ASSISTED VAGINAL HYSTERECTOMY WITH BILATERAL SALPINGO OOPHORECTOMY, A & P REPAIR TOT (N/A) ANTERIOR (CYSTOCELE) AND POSTERIOR REPAIR (RECTOCELE) (N/A) TOT (N/A) LEFORT PROCEDURE    SURGEON(S): Surgeons and Role:    Linzie Collin, MD - Primary    Hildred Laser, MD - Assisting   ANESTHESIA: Choice  ESTIMATED BLOOD LOSS: 25 mL  OPERATIVE FINDINGS: Once the patient was relaxed prior to surgery she evidenced complete pelvic procidentia.  The entirety of the uterus could be palpated outside of the vagina.  SPECIMEN:  ID Type Source Tests Collected by Time Destination  1 : Uterus, cervix, bilateral fallopian tubes and ovaries GYN Bilateral Tubes, Ovaries, Cervix, and Uterus SURGICAL PATHOLOGY Linzie Collin, MD 01/23/2024 1203     COMPLICATIONS: None  DRAINS: Foley to gravity  DISPOSITION: Stable to recovery room  DESCRIPTION OF PROCEDURE:      The patient was prepped and draped in the dorsolithotomy position and placed under general anesthesia. The bladder was emptied. The cervix was grasped with a multi-toothed tenaculum and a uterine manipulator was placed within the cervical os respecting the position and curvature of the uterus. After changing gloves we proceeded abdominally. A small infraumbilical incision was made and a 5 mm trocar port was placed within the abdominopelvic cavity. The opening pressure was less than 7 mmHg.  Approximately 3 and 1/2 L of carbon dioxide gas was instilled within the abdominal pelvic cavity. The laparoscope was placed and the pelvis and abdomen were carefully inspected. In the usual manner, under direct visualization right and left lower quadrant ports of 5 mm size were placed. Both ureters were identified in the pelvis prior to dissection or clamping  and cutting of pedicles. A window was made in the broad ligament and the infundibulopelvic ligaments were carefully identified. The ligaments were clamped divided and doubly suture ligated. Hemostasis of the pedicles was noted. The fallopian tubes were elevated and the mesenteric side systematically coagulated and divided allowing the tube to be removed at the time of uterine removal. The round ligaments were coagulated and divided and a bladder flap was created. The upper aspect of the broad ligament was clamped coagulated and divided. The uterine arteries were skeletonized, triply coagulated and divided. Careful inspection of all pedicles and the remainder of the pelvis was performed. Hemostasis was noted.  Because anatomy was so distorted we backfilled the bladder with methylene blue and checked for leakage.  No leakage was noted. The lower quadrant ports were removed, hemostasis of the port sites was noted, and the incisions were closed in subcuticular manner. The laparoscope and trocar sleeve were removed from the infraumbilical incision, hemostasis was noted, and the incision was closed in a subcuticular manner. A long-acting anesthetic was employed in the skin incisions. We then proceeded vaginally. A multi-toothed tenaculum was used to grasp the cervix and the cervix was injected in a circumferential manner with a dilute Pitressin solution. An incision was made around the cervix and the vaginal mucosa was dissected off of the cervix. The posterior cul-de-sac was identified and entered and the weighted speculum was placed within this. The anterior cul-de-sac was identified and entered and a retractor was placed and used to retract the bladder anteriorly keeping it out of the operative field. The uterosacral ligaments were clamped divided and suture ligated. The cardinal ligaments were  clamped divided and suture ligated. The small remaining pedicle was clamped divided and suture ligated bilaterally  allowing delivery of the specimen. Angle sutures were placed in the usual manner. A culdoplasty was performed. The peritoneum was identified anteriorly and then incorporating the left upper pedicle left lower pedicle right lower pedicle right upper pedicle and anterior peritoneum a pursestring suture was placed exteriorizing all pedicles. Hemostasis of all pedicles was noted at this time.  T The vaginal mucosa beginning at the vaginal cuff and overlying the bladder, was grasped with Allis clamps and injected with a dilute Pitressin solution in the midline. A midline incision was made to the level of the urethra. The vaginal mucosa was dissected laterally from the underlying attenuated fascia. A Foley catheter was placed within the bladder and the bladder was emptied. Clear urine was noted. The obturator foramina were identified in the usual manner bilaterally and marked with a marking pen the skin and subcutaneous tissues were injected with a dilute Pitressin solution. Stab incisions were made and the TOT trochars were placed through these incisions onto the operator's finger in the vagina which was retracted and bladder medially. The vaginal tape was then placed on the trochars and reversed through these incisions. A Kelly clamp was placed under the tape and the sleeves of the tape were removed. The tape was noted to be correctly positioned underneath the urethra without twists. The excess tape was removed at the level of the skin. Steri-Strips were applied over these small skin incisions. A typical Kelly plication was performed carefully covering the tension-free vaginal tape with thickened fascia. The bladder was plicated several sutures of 3-0 Vicryl.  A shelf of fascia was then approximated in the midline placing the bladder back in its more anatomic position.some of the excess vaginal mucosa was trimmed. The posterior fourchette at approximately the hymenal ring was grasped using Allis clamps. The  posterior vaginal mucosa was injected in the midline with a dilute Pitressin solution. A midline incision was made through the vaginal mucosa to the level of the vaginal cuff, and the vaginal mucosa was dissected laterally exposing the underlying attenuated fascia.  Because there was signs of excessive prolapse we decided to go forward with a LeFort procedure.  The anterior inverted vaginal mucosa was sutured to the trimmed denuded posterior vaginal mucosa and held extending down the lateral aspects of the anterior and posterior vagina.  The vaginal cuff was then interiorized and the highest anterior and posterior sutures were tied together approximating the anterior denuded area with the posterior denuded area.  We carried this tying and approximation down until the most exterior part was approximated.   The perineoplasty was performed with the last bit of vagina creating deep sutures in the bulbocavernosus muscle for approximation and then a subcuticular suture proximally and the remainder of the vagina.  The perineal body was reinforced with multiple sutures of Vicryl. Hemostasis was noted.  Blue urine was noted in the Foley. The patient went to recovery room in stable condition. Clear urine was noted in the Foley at the conclusion of the procedure. Dr. Valentino Saxon provided exposure, dissection, suctioning, retraction, and general support and assistance during the procedure.   Elonda Husky, M.D. 01/23/2024 1:57 PM

## 2024-01-24 ENCOUNTER — Encounter: Payer: Self-pay | Admitting: Obstetrics and Gynecology

## 2024-01-24 ENCOUNTER — Encounter: Payer: PPO | Admitting: Cardiology

## 2024-01-24 ENCOUNTER — Telehealth: Payer: Self-pay

## 2024-01-24 DIAGNOSIS — N393 Stress incontinence (female) (male): Secondary | ICD-10-CM

## 2024-01-24 NOTE — Anesthesia Postprocedure Evaluation (Signed)
 Anesthesia Post Note  Patient: Valerie Curry  Procedure(s) Performed: LAPAROSCOPIC ASSISTED VAGINAL HYSTERECTOMY WITH BILATERAL SALPINGO OOPHORECTOMY, A & P REPAIR TOT ANTERIOR (CYSTOCELE) AND POSTERIOR REPAIR (RECTOCELE) TOT  Patient location during evaluation: PACU Anesthesia Type: General Level of consciousness: awake and alert Pain management: pain level controlled Vital Signs Assessment: post-procedure vital signs reviewed and stable Respiratory status: spontaneous breathing, nonlabored ventilation, respiratory function stable and patient connected to nasal cannula oxygen Cardiovascular status: blood pressure returned to baseline and stable Postop Assessment: no apparent nausea or vomiting Comments: Concern for corneal abrasion on the right eye. This was explained to patient. She was instructed to let us know if the symptoms do not resolve in 24 hours. She voiced understanding.   Encounter Notable Events  Notable Event Outcome Phase Comment  Corneal abrasion  Postprocedure, before discharge right eye symptoms consistent with corneal abrasion     Last Vitals:  Vitals:   01/23/24 1502 01/23/24 1625  BP: (!) 115/97 118/63  Pulse: 64 62  Resp: 14 14  Temp: (!) 36.2 C   SpO2: 100% 100%    Last Pain:  Vitals:   01/23/24 1625  TempSrc:   PainSc: 4                  Cleda Mccreedy Fermin Yan

## 2024-01-24 NOTE — Telephone Encounter (Signed)
 Called to speak with Bonita Quin and how she is doing post-op. She sounds great and reports feeling well, getting up and moving around well. States urinating well with no pain or issue at this time. States her eye is doing much better, reported it was scratched during her stay. Only question was regarding when she may have a BM, stated it may be a day or so but she is able to take stool softeners for relief. She will be seen on 3/11 for an in-office follow-up unless one is needed sooner.

## 2024-01-25 LAB — SURGICAL PATHOLOGY

## 2024-02-02 ENCOUNTER — Other Ambulatory Visit: Payer: Self-pay | Admitting: Acute Care

## 2024-02-02 DIAGNOSIS — Z122 Encounter for screening for malignant neoplasm of respiratory organs: Secondary | ICD-10-CM

## 2024-02-02 DIAGNOSIS — Z87891 Personal history of nicotine dependence: Secondary | ICD-10-CM

## 2024-02-07 ENCOUNTER — Ambulatory Visit: Payer: PPO | Admitting: Obstetrics and Gynecology

## 2024-02-07 ENCOUNTER — Encounter: Payer: Self-pay | Admitting: Obstetrics and Gynecology

## 2024-02-07 VITALS — BP 123/86 | HR 70 | Ht 62.0 in | Wt 171.5 lb

## 2024-02-07 DIAGNOSIS — B3731 Acute candidiasis of vulva and vagina: Secondary | ICD-10-CM

## 2024-02-07 DIAGNOSIS — Z9889 Other specified postprocedural states: Secondary | ICD-10-CM

## 2024-02-07 DIAGNOSIS — Z4889 Encounter for other specified surgical aftercare: Secondary | ICD-10-CM

## 2024-02-07 MED ORDER — FLUCONAZOLE 150 MG PO TABS: 150.0000 mg | ORAL_TABLET | Freq: Once | ORAL | 0 refills | Status: AC

## 2024-02-07 NOTE — Progress Notes (Signed)
 Patient presents for 2 week postop follow-up following LAVH BSO, A&P Repair w/ TOT. She states doing well, has occasional urine leakage but better than before.

## 2024-02-07 NOTE — Progress Notes (Signed)
 HPI:      Valerie Curry is a 78 y.o. G2P2 who LMP was No LMP recorded. Patient is postmenopausal.  Subjective:   She presents today approximately 2 weeks from LAVH A&P repair TOT for complete uterine procidentia.  LeFort procedure.   She reports that she is very happy with her surgery says that she could not be more pleased.  She still makes a small amount of urine but it is much improved since the surgery.  She reports no pain.  No difficulty with bowel movements.  She is so happy that nothing is bulging from the vagina any longer.  She does complain of some vulvar itching.    Hx: The following portions of the patient's history were reviewed and updated as appropriate:             She  has a past medical history of Acute gastric erosion, Arthritis, Atherosclerosis of aorta (HCC) (12/13/2018), Benign neoplasm of descending colon, Bursitis of hip (04/17/2020), CAD (coronary artery disease) (12/07/2018), Calculus of gallbladder without cholecystitis without obstruction (09/20/2019), Car sickness, Complication of anesthesia, HFrEF (heart failure with reduced ejection fraction) (HCC) (02/2022), History of bilateral cataract extraction (2024), History of hiatal hernia, HLD (hyperlipidemia) (11/17/2018), Hypertension, Left bundle branch block, Lump or mass in breast (benign) (2011/2012), Multinodular thyroid (12/30/2020), NICM (nonischemic cardiomyopathy) (HCC), Obesity, unspecified, Osteopenia, Osteoporosis (06/27/2007), Personal history of tobacco use, presenting hazards to health, PONV (postoperative nausea and vomiting), Positive PPD as a child, Spinal stenosis, lumbar region with neurogenic claudication (04/30/2015), Systemic sclerosis (HCC), Umbilical hernia without obstruction and without gangrene, and Uterine prolapse (11/17/2018). She does not have any pertinent problems on file. She  has a past surgical history that includes Tonsillectomy (1968); Colonoscopy (06/2004); Bunionectomy (Bilateral,  2010); Appendectomy (1961); Tubal ligation (1974); Decompressive lumbar laminectomy level 2 (N/A, 04/30/2015); Colonoscopy with propofol (N/A, 01/10/2019); Cholecystectomy (N/A, 10/03/2019); Breast biopsy (Right, 09/2010); Breast biopsy (Left, 09/2011); Esophagogastroduodenoscopy (egd) with propofol (N/A, 04/07/2021); polypectomy (04/07/2021); ERCP w/ sphicterotomy; LEFT HEART CATH AND CORONARY ANGIOGRAPHY (Left, 03/17/2022); Cataract extraction w/PHACO (Right, 06/21/2023); Cataract extraction w/PHACO (Left, 07/05/2023); Laparoscopic vaginal hysterectomy with salpingo oophorectomy (N/A, 01/23/2024); Anterior and posterior repair (N/A, 01/23/2024); and Bladder suspension (N/A, 01/23/2024). Her family history includes Breast cancer in her cousin; Breast cancer (age of onset: 67) in her maternal aunt and paternal aunt; Cancer in her maternal aunt; Heart disease in her mother; Heart disease (age of onset: 36) in her father; Hypertension in her mother; Lung cancer in her father; Other in her brother and another family member; Stroke in her mother. She  reports that she quit smoking about 9 years ago. Her smoking use included cigarettes. She started smoking about 59 years ago. She has a 50 pack-year smoking history. She quit smokeless tobacco use about 8 years ago. She reports current alcohol use. She reports that she does not use drugs. She has a current medication list which includes the following prescription(s): fluconazole, carvedilol, dapagliflozin propanediol, furosemide, hydrocodone-acetaminophen, ibuprofen, meloxicam, rosuvastatin, sacubitril-valsartan, and spironolactone. She has no known allergies.       Review of Systems:  Review of Systems  Constitutional: Denied constitutional symptoms, night sweats, recent illness, fatigue, fever, insomnia and weight loss.  Eyes: Denied eye symptoms, eye pain, photophobia, vision change and visual disturbance.  Ears/Nose/Throat/Neck: Denied ear, nose, throat or neck  symptoms, hearing loss, nasal discharge, sinus congestion and sore throat.  Cardiovascular: Denied cardiovascular symptoms, arrhythmia, chest pain/pressure, edema, exercise intolerance, orthopnea and palpitations.  Respiratory: Denied pulmonary symptoms, asthma,  pleuritic pain, productive sputum, cough, dyspnea and wheezing.  Gastrointestinal: Denied, gastro-esophageal reflux, melena, nausea and vomiting.  Genitourinary: Denied genitourinary symptoms including symptomatic vaginal discharge, pelvic relaxation issues, and urinary complaints.  Musculoskeletal: Denied musculoskeletal symptoms, stiffness, swelling, muscle weakness and myalgia.  Dermatologic: Denied dermatology symptoms, rash and scar.  Neurologic: Denied neurology symptoms, dizziness, headache, neck pain and syncope.  Psychiatric: Denied psychiatric symptoms, anxiety and depression.  Endocrine: Denied endocrine symptoms including hot flashes and night sweats.   Meds:   Current Outpatient Medications on File Prior to Visit  Medication Sig Dispense Refill   carvedilol (COREG) 3.125 MG tablet TAKE 1 TABLET BY MOUTH 2 TIMES DAILY WITH A MEAL. 90 tablet 3   dapagliflozin propanediol (FARXIGA) 10 MG TABS tablet Take 1 tablet (10 mg total) by mouth daily before breakfast. 100 tablet 3   furosemide (LASIX) 20 MG tablet Take 1 tablet (20 mg total) by mouth daily as needed for edema or fluid. 30 tablet 3   HYDROcodone-acetaminophen (NORCO/VICODIN) 5-325 MG tablet Take 1-2 tablets by mouth every 6 (six) hours as needed for moderate pain (pain score 4-6). 25 tablet 0   ibuprofen (ADVIL) 200 MG tablet Take 200 mg by mouth every 6 (six) hours as needed.     meloxicam (MOBIC) 15 MG tablet Take 15 mg by mouth daily as needed for pain.     rosuvastatin (CRESTOR) 20 MG tablet TAKE 1 TABLET BY MOUTH EVERY EVENING 90 tablet 3   sacubitril-valsartan (ENTRESTO) 49-51 MG Take 1 tablet by mouth 2 (two) times daily. 60 tablet 11   spironolactone  (ALDACTONE) 25 MG tablet Take 1 tablet (25 mg total) by mouth daily. 90 tablet 3   No current facility-administered medications on file prior to visit.      Objective:     Vitals:   02/07/24 1109  BP: 123/86  Pulse: 70   Filed Weights   02/07/24 1109  Weight: 171 lb 8 oz (77.8 kg)                        Assessment:    G2P2 Patient Active Problem List   Diagnosis Date Noted   Stress incontinence 01/24/2024   Cough 12/27/2023   Urine frequency 10/11/2023   HFrEF (heart failure with reduced ejection fraction) (HCC) 03/17/2022   Hiatal hernia    Acute gastric erosion    Thyroid nodule 12/30/2020   Abdominal pain 06/27/2020   Abnormal LFTs 06/27/2020   Bursitis of hip 04/17/2020   Epigastric pain 12/28/2019   Pain in thumb joint with movement, right 11/13/2019   Osteoarthritis of knee 11/13/2019   Symptomatic cholelithiasis    Umbilical hernia without obstruction and without gangrene    Right bundle branch block (RBBB) with incomplete left bundle branch block (LBBB) 09/28/2019   Calculus of gallbladder without cholecystitis without obstruction 09/20/2019   Elevated liver enzymes 09/20/2019   Chronic diarrhea    Benign neoplasm of descending colon    Atherosclerosis of aorta (HCC) 12/13/2018   Coronary artery disease 11/2018   HLD (hyperlipidemia) 11/17/2018   Complete uterovaginal prolapse 11/17/2018   Diarrhea 11/17/2018   Family history of clotting disorder 11/15/2017   Postmenopausal estrogen deficiency 10/06/2015   Spinal stenosis, lumbar region, with neurogenic claudication 04/30/2015   Low back pain 03/25/2015   Screening for breast cancer 09/13/2014   Obesity (BMI 30-39.9) 09/13/2014   HTN (hypertension) 08/31/2013   Osteoporosis 06/27/2007     1. Postoperative state  2. Monilial vulvovaginitis     Excellent recovery postop.  Likely some vulvar monilia.   Plan:            1.  Diflucan  2.  Stressed no heavy lifting. Orders No orders of the  defined types were placed in this encounter.    Meds ordered this encounter  Medications   fluconazole (DIFLUCAN) 150 MG tablet    Sig: Take 1 tablet (150 mg total) by mouth once for 1 dose.    Dispense:  1 tablet    Refill:  0      F/U  Return in about 5 weeks (around 03/13/2024).  Elonda Husky, M.D. 02/07/2024 11:32 AM

## 2024-02-09 DIAGNOSIS — Z961 Presence of intraocular lens: Secondary | ICD-10-CM | POA: Diagnosis not present

## 2024-02-09 DIAGNOSIS — H353131 Nonexudative age-related macular degeneration, bilateral, early dry stage: Secondary | ICD-10-CM | POA: Diagnosis not present

## 2024-02-09 DIAGNOSIS — H43813 Vitreous degeneration, bilateral: Secondary | ICD-10-CM | POA: Diagnosis not present

## 2024-02-09 DIAGNOSIS — H524 Presbyopia: Secondary | ICD-10-CM | POA: Diagnosis not present

## 2024-02-13 ENCOUNTER — Ambulatory Visit
Admission: RE | Admit: 2024-02-13 | Discharge: 2024-02-13 | Disposition: A | Discharge status: Home / Self Care | Source: Ambulatory Visit | Attending: Acute Care | Admitting: Acute Care

## 2024-02-13 DIAGNOSIS — Z122 Encounter for screening for malignant neoplasm of respiratory organs: Secondary | ICD-10-CM | POA: Insufficient documentation

## 2024-02-13 DIAGNOSIS — Z87891 Personal history of nicotine dependence: Secondary | ICD-10-CM | POA: Diagnosis not present

## 2024-02-19 NOTE — Progress Notes (Unsigned)
 [facility] NOTE  Referring Physician: Allegra Grana, FNP  Primary Care: Allegra Grana, FNP  HPI: Valerie Curry is a 78 y.o. female with heart failure with reduced ejection fraction secondary to nonischemic cardiomyopathy, left bundle branch block, nonobstructive CAD, history of tobacco use for greater than 40 years presenting today to establish care.She was diagnosed with LBBB 4-5 years ago when undergoing cholecystecomty leading to TTE showing reduced LVEF.  Today she presents for follow up.   Continuing to do very well from a functional standpoint.  Compliant with all GDMT.  Reports that she feels better than she did previously after starting low-dose medications.  At this time does not wish to undergo any invasive procedures including CRT-D/BiV pacemaker unless there is a drop in LVEF and symptoms.  Current Outpatient Medications  Medication Sig Dispense Refill   carvedilol (COREG) 3.125 MG tablet TAKE 1 TABLET BY MOUTH 2 TIMES DAILY WITH A MEAL. 90 tablet 3   dapagliflozin propanediol (FARXIGA) 10 MG TABS tablet Take 1 tablet (10 mg total) by mouth daily before breakfast. 100 tablet 3   furosemide (LASIX) 20 MG tablet Take 1 tablet (20 mg total) by mouth daily as needed for edema or fluid. 30 tablet 3   HYDROcodone-acetaminophen (NORCO/VICODIN) 5-325 MG tablet Take 1-2 tablets by mouth every 6 (six) hours as needed for moderate pain (pain score 4-6). 25 tablet 0   ibuprofen (ADVIL) 200 MG tablet Take 200 mg by mouth every 6 (six) hours as needed.     meloxicam (MOBIC) 15 MG tablet Take 15 mg by mouth daily as needed for pain.     rosuvastatin (CRESTOR) 20 MG tablet TAKE 1 TABLET BY MOUTH EVERY EVENING 90 tablet 3   sacubitril-valsartan (ENTRESTO) 49-51 MG Take 1 tablet by mouth 2 (two) times daily. 60 tablet 11   spironolactone (ALDACTONE) 25 MG tablet Take 1 tablet (25 mg total) by mouth daily. 90 tablet 3   No current facility-administered medications  for this visit.      PHYSICAL EXAM: There were no vitals filed for this visit. GENERAL: NAD Lungs- *** CARDIAC:  JVP: *** cm          Normal rate with regular rhythm. *** murmur.  Pulses ***. *** edema.  ABDOMEN: Soft, non-tender, non-distended.  EXTREMITIES: Warm and well perfused.  NEUROLOGIC: No obvious FND   DATA REVIEW  ECG: NSR w/ LBBB, QRSd  As per my personal interpretation  ECHO: 03/31/23: LVEF 30-35%, RV function is normal  as per my personal interpretation  CATH: 03/17/22: Mild, non-obstructive coronary artery disease with up to 20% stenosis involving the proximal LAD and mid RCA. Upper normal left ventricular filling pressure (LVEDP 15 mmHg).  CMR: 05/04/23 1. Normal LV size, moderately reduced LV function, LVEF 43%.  2. There is no late gadolinium enhancement in the left ventricular myocardium.  3. There is no evidence for myocardial infiltration.  4. Normal RV size and function.  5. Findings consistent with non ischemic cardiomyopathy.  ASSESSMENT & PLAN:  Heart failure reduced ejection fraction Etiology of HF: Nonischemic cardiomyopathy, likely secondary to underlying left bundle branch block.  Cardiac MRI with no LGE.  Left heart cath from April 2023 with minimal nonobstructive CAD. NYHA class / AHA Stage: NYHA II Volume status & Diuretics: Mildly hypervolemic on exam.  Take Lasix 20 mg tomorrow and then as needed only Vasodilators: Continue Entresto 49/51mg  BID.  Beta-Blocker: continue coreg 3.125mg  MRA: Spironolactone 25 mg daily Cardiometabolic:continue  farxiga 10mg  daily Devices therapies & Valvulopathies: After lengthy discussion with her she currently has no heart failure symptoms and has had an improvement in EF on cardiac MRI.  We will continue to monitor her LVEF by echocardiogram.  No indication for BiV device placement at this time; she is also not interested in any invasive procedures unless she becomes symptomatic;  She has NYHA II  symptoms. Advanced therapies: Not currently indicated see above  2. Nonobstructive CAD -Crestor 20 mg daily  3. HTN -Well-controlled.  See above.  Valerie Curry [facility] Mechanical Circulatory Support

## 2024-02-20 ENCOUNTER — Encounter: Payer: Self-pay | Admitting: Cardiology

## 2024-02-20 ENCOUNTER — Ambulatory Visit: Payer: PPO | Attending: Cardiology | Admitting: Cardiology

## 2024-02-20 VITALS — BP 120/70 | HR 65 | Wt 170.2 lb

## 2024-02-20 DIAGNOSIS — I447 Left bundle-branch block, unspecified: Secondary | ICD-10-CM

## 2024-02-20 DIAGNOSIS — I5020 Unspecified systolic (congestive) heart failure: Secondary | ICD-10-CM | POA: Diagnosis not present

## 2024-02-20 DIAGNOSIS — I2510 Atherosclerotic heart disease of native coronary artery without angina pectoris: Secondary | ICD-10-CM | POA: Diagnosis not present

## 2024-02-20 MED ORDER — METOPROLOL SUCCINATE ER 25 MG PO TB24: 12.5000 mg | ORAL_TABLET | Freq: Every day | ORAL | 1 refills | Status: DC

## 2024-02-20 NOTE — Patient Instructions (Signed)
 Medication Changes:  STOP Carvedilol  START Metoprolol 12.5mg  (1/2 tab) every night  Lab Work:  Go DOWN to LOWER LEVEL (LL) to have your blood work completed inside of [employer] office.  We will only call you if the results are abnormal or if the provider would like to make medication changes.   Testing/Procedures:  Your physician has requested that you have an echocardiogram. Echocardiography is a painless test that uses sound waves to create images of your heart. It provides your doctor with information about the size and shape of your heart and how well your heart's chambers and valves are working. This procedure takes approximately one hour. There are no restrictions for this procedure. Please do NOT wear cologne, perfume, aftershave, or lotions (deodorant is allowed). Please arrive 15 minutes prior to your appointment time.  Please note: We ask at that you not bring children with you during ultrasound (echo/ vascular) testing. Due to room size and safety concerns, children are not allowed in the ultrasound rooms during exams. Our front office staff cannot provide observation of children in our lobby area while testing is being conducted. An adult accompanying a patient to their appointment will only be allowed in the ultrasound room at the discretion of the ultrasound technician under special circumstances. We apologize for any inconvenience.   Please have your echo completed. You will check in for this at the [facility]. You have to arrive 15 MINS EARLY for preparation, otherwise you will have to reschedule.    Follow-Up in: Please follow up with the [facility] in 6 months. We currently do not have that schedule. Please give [place] a call in August in order to schedule that appointment for September.   At the [facility], you and your health needs are our priority. We have a designated team specialized in the treatment of Heart Failure. This Care  Team includes your primary Heart Failure Specialized Cardiologist (physician), Advanced Practice Providers (APPs- Physician Assistants and Nurse Practitioners), and Pharmacist who all work together to provide you with the care you need, when you need it.   You may see any of the following providers on your designated Care Team at your next follow up:  Dr. Arvilla Meres Dr. Marca Ancona Dr. Dorthula Nettles Dr. Theresia Bough Clarisa Kindred, FNP Enos Fling, RPH-CPP  Please be sure to bring in all your medications bottles to every appointment.   Need to Contact us:  If you have any questions or concerns before your next appointment please send [place] a message through [place] or call our office at [phone].    TO LEAVE A MESSAGE FOR THE NURSE SELECT OPTION 2, PLEASE LEAVE A MESSAGE INCLUDING: YOUR NAME DATE OF BIRTH CALL BACK NUMBER REASON FOR CALL**this is important as we prioritize the call backs  YOU WILL RECEIVE A CALL BACK THE SAME DAY AS LONG AS YOU CALL BEFORE 4:00 PM

## 2024-02-21 LAB — BASIC METABOLIC PANEL
BUN/Creatinine Ratio: 25 (ref 12–28)
BUN: 21 mg/dL (ref 8–27)
CO2: 24 mmol/L (ref 20–29)
Calcium: 10 mg/dL (ref 8.7–10.3)
Chloride: 102 mmol/L (ref 96–106)
Creatinine, Ser: 0.85 mg/dL (ref 0.57–1.00)
Glucose: 93 mg/dL (ref 70–99)
Potassium: 4.2 mmol/L (ref 3.5–5.2)
Sodium: 144 mmol/L (ref 134–144)
eGFR: 71 mL/min/{1.73_m2} (ref >59)

## 2024-02-21 LAB — BRAIN NATRIURETIC PEPTIDE: BNP: 53.2 pg/mL (ref 0.0–100.0)

## 2024-02-28 ENCOUNTER — Encounter: Payer: Self-pay | Admitting: Cardiology

## 2024-03-07 ENCOUNTER — Encounter: Payer: Self-pay | Admitting: Cardiology

## 2024-03-13 ENCOUNTER — Encounter: Admitting: Obstetrics and Gynecology

## 2024-03-13 DIAGNOSIS — Z9889 Other specified postprocedural states: Secondary | ICD-10-CM

## 2024-03-21 ENCOUNTER — Encounter: Admitting: Obstetrics and Gynecology

## 2024-03-21 ENCOUNTER — Ambulatory Visit
Admission: RE | Admit: 2024-03-21 | Discharge: 2024-03-21 | Disposition: A | Discharge status: Home / Self Care | Source: Ambulatory Visit | Attending: Cardiology | Admitting: Cardiology

## 2024-03-21 DIAGNOSIS — I082 Rheumatic disorders of both aortic and tricuspid valves: Secondary | ICD-10-CM | POA: Insufficient documentation

## 2024-03-21 DIAGNOSIS — I5020 Unspecified systolic (congestive) heart failure: Secondary | ICD-10-CM | POA: Insufficient documentation

## 2024-03-21 DIAGNOSIS — Z9889 Other specified postprocedural states: Secondary | ICD-10-CM

## 2024-03-21 LAB — ECHOCARDIOGRAM COMPLETE
AR max vel: 2.21 cm2
AV Area VTI: 2.36 cm2
AV Area mean vel: 2.04 cm2
AV Mean grad: 3 mmHg
AV Peak grad: 5.2 mmHg
Ao pk vel: 1.14 m/s
Area-P 1/2: 5.31 cm2
Calc EF: 52.4 %
MV VTI: 3.25 cm2
P 1/2 time: 605 ms
S' Lateral: 3.5 cm
Single Plane A2C EF: 54.6 %
Single Plane A4C EF: 48.5 %

## 2024-03-21 NOTE — Progress Notes (Signed)
*  PRELIMINARY RESULTS* Echocardiogram 2D Echocardiogram has been performed.  Valerie Curry 03/21/2024, 10:52 AM

## 2024-03-21 NOTE — Progress Notes (Signed)
*  PRELIMINARY RESULTS* Echocardiogram 2D Echocardiogram has been performed.  Broadus Canes 03/21/2024, 10:51 AM

## 2024-03-22 ENCOUNTER — Encounter: Admitting: Obstetrics and Gynecology

## 2024-03-22 ENCOUNTER — Other Ambulatory Visit: Payer: Self-pay

## 2024-03-27 ENCOUNTER — Other Ambulatory Visit (HOSPITAL_COMMUNITY)
Admission: RE | Admit: 2024-03-27 | Discharge: 2024-03-27 | Disposition: A | Discharge status: Home / Self Care | Source: Ambulatory Visit | Attending: Obstetrics and Gynecology | Admitting: Obstetrics and Gynecology

## 2024-03-27 ENCOUNTER — Encounter: Payer: Self-pay | Admitting: Obstetrics and Gynecology

## 2024-03-27 ENCOUNTER — Ambulatory Visit (INDEPENDENT_AMBULATORY_CARE_PROVIDER_SITE_OTHER): Admitting: Obstetrics and Gynecology

## 2024-03-27 VITALS — BP 129/82 | HR 62 | Ht 62.0 in | Wt 173.3 lb

## 2024-03-27 DIAGNOSIS — Z9889 Other specified postprocedural states: Secondary | ICD-10-CM | POA: Diagnosis not present

## 2024-03-27 DIAGNOSIS — N898 Other specified noninflammatory disorders of vagina: Secondary | ICD-10-CM | POA: Insufficient documentation

## 2024-03-27 MED ORDER — FLUCONAZOLE 150 MG PO TABS: 150.0000 mg | ORAL_TABLET | ORAL | 0 refills | Status: DC

## 2024-03-27 NOTE — Progress Notes (Signed)
 Patient presents for 2 month postop follow-up following LAVH, A&P Repair with TOT. She states doing well, only complaint is slight vaginal itching.

## 2024-03-27 NOTE — Progress Notes (Addendum)
 HPI:      Ms. Valerie Curry is a 78 y.o. G2P2 who LMP was No LMP recorded. Patient is postmenopausal.  Subjective:   She presents today approximately 8 weeks after LAVH BSO AMP repair TOT and LeFort procedure. She states that she is much more comfortable and doing very well.  She does have occasional urine loss but it is much improved. She continues to have some vaginal itching-this improved at her last visit after Diflucan  but has not returned.  She denies vaginal discharge or bleeding. She is not having any problems with bowel movements or voiding.    Hx: The following portions of the patient's history were reviewed and updated as appropriate:             She  has a past medical history of Acute gastric erosion, Arthritis, Atherosclerosis of aorta (HCC) (12/13/2018), Benign neoplasm of descending colon, Bursitis of hip (04/17/2020), CAD (coronary artery disease) (12/07/2018), Calculus of gallbladder without cholecystitis without obstruction (09/20/2019), Car sickness, Complication of anesthesia, HFrEF (heart failure with reduced ejection fraction) (HCC) (02/2022), History of bilateral cataract extraction (2024), History of hiatal hernia, HLD (hyperlipidemia) (11/17/2018), Hypertension, Left bundle branch block, Lump or mass in breast (benign) (2011/2012), Multinodular thyroid  (12/30/2020), NICM (nonischemic cardiomyopathy) (HCC), Obesity, unspecified, Osteopenia, Osteoporosis (06/27/2007), Personal history of tobacco use, presenting hazards to health, PONV (postoperative nausea and vomiting), Positive PPD as a child, Spinal stenosis, lumbar region with neurogenic claudication (04/30/2015), Systemic sclerosis (HCC), Umbilical hernia without obstruction and without gangrene, and Uterine prolapse (11/17/2018). She does not have any pertinent problems on file. She  has a past surgical history that includes Tonsillectomy (1968); Colonoscopy (06/2004); Bunionectomy (Bilateral, 2010); Appendectomy (1961);  Tubal ligation (1974); Decompressive lumbar laminectomy level 2 (N/A, 04/30/2015); Colonoscopy with propofol  (N/A, 01/10/2019); Cholecystectomy (N/A, 10/03/2019); Breast biopsy (Right, 09/2010); Breast biopsy (Left, 09/2011); Esophagogastroduodenoscopy (egd) with propofol  (N/A, 04/07/2021); polypectomy (04/07/2021); ERCP w/ sphicterotomy; LEFT HEART CATH AND CORONARY ANGIOGRAPHY (Left, 03/17/2022); Cataract extraction w/PHACO (Right, 06/21/2023); Cataract extraction w/PHACO (Left, 07/05/2023); Laparoscopic vaginal hysterectomy with salpingo oophorectomy (N/A, 01/23/2024); Anterior and posterior repair (N/A, 01/23/2024); and Bladder suspension (N/A, 01/23/2024). Her family history includes Breast cancer in her cousin; Breast cancer (age of onset: 68) in her maternal aunt and paternal aunt; Cancer in her maternal aunt; Heart disease in her mother; Heart disease (age of onset: 79) in her father; Hypertension in her mother; Lung cancer in her father; Other in her brother and another family member; Stroke in her mother. She  reports that she quit smoking about 9 years ago. Her smoking use included cigarettes. She started smoking about 59 years ago. She has a 50 pack-year smoking history. She quit smokeless tobacco use about 8 years ago. She reports current alcohol use. She reports that she does not use drugs. She has a current medication list which includes the following prescription(s): dapagliflozin  propanediol, furosemide , ibuprofen , meloxicam , metoprolol  succinate, rosuvastatin , sacubitril -valsartan , and spironolactone . She has no known allergies.       Review of Systems:  Review of Systems  Constitutional: Denied constitutional symptoms, night sweats, recent illness, fatigue, fever, insomnia and weight loss.  Eyes: Denied eye symptoms, eye pain, photophobia, vision change and visual disturbance.  Ears/Nose/Throat/Neck: Denied ear, nose, throat or neck symptoms, hearing loss, nasal discharge, sinus congestion and  sore throat.  Cardiovascular: Denied cardiovascular symptoms, arrhythmia, chest pain/pressure, edema, exercise intolerance, orthopnea and palpitations.  Respiratory: Denied pulmonary symptoms, asthma, pleuritic pain, productive sputum, cough, dyspnea and wheezing.  Gastrointestinal: Denied, gastro-esophageal reflux, melena,  nausea and vomiting.  Genitourinary: See HPI for additional information.  Musculoskeletal: Denied musculoskeletal symptoms, stiffness, swelling, muscle weakness and myalgia.  Dermatologic: Denied dermatology symptoms, rash and scar.  Neurologic: Denied neurology symptoms, dizziness, headache, neck pain and syncope.  Psychiatric: Denied psychiatric symptoms, anxiety and depression.  Endocrine: Denied endocrine symptoms including hot flashes and night sweats.   Meds:   Current Outpatient Medications on File Prior to Visit  Medication Sig Dispense Refill   dapagliflozin  propanediol (FARXIGA ) 10 MG TABS tablet Take 1 tablet (10 mg total) by mouth daily before breakfast. 100 tablet 3   furosemide  (LASIX ) 20 MG tablet Take 1 tablet (20 mg total) by mouth daily as needed for edema or fluid. 30 tablet 3   ibuprofen  (ADVIL ) 200 MG tablet Take 200 mg by mouth every 6 (six) hours as needed.     meloxicam  (MOBIC ) 15 MG tablet Take 15 mg by mouth daily as needed for pain.     metoprolol  succinate (TOPROL  XL) 25 MG 24 hr tablet Take 0.5 tablets (12.5 mg total) by mouth daily. 45 tablet 1   rosuvastatin  (CRESTOR ) 20 MG tablet TAKE 1 TABLET BY MOUTH EVERY EVENING 90 tablet 3   sacubitril -valsartan  (ENTRESTO ) 49-51 MG Take 1 tablet by mouth 2 (two) times daily. 60 tablet 11   spironolactone  (ALDACTONE ) 25 MG tablet Take 1 tablet (25 mg total) by mouth daily. 90 tablet 3   No current facility-administered medications on file prior to visit.      Objective:     Vitals:   03/27/24 1038  BP: 129/82  Pulse: 62   Filed Weights   03/27/24 1038  Weight: 173 lb 4.8 oz (78.6 kg)     Vulva is erythematous with apparent rash likely consistent with monilia.          Gentle vaginal examination performed.  Vaginal mucosa healed well.  Patient has some degree of rectocele and some vaginal cuff prolapse.  Grade 2.  Apparent partial failure of LeFort procedure.          Assessment:    G2P2 Patient Active Problem List   Diagnosis Date Noted   Stress incontinence 01/24/2024   Cough 12/27/2023   Urine frequency 10/11/2023   HFrEF (heart failure with reduced ejection fraction) (HCC) 03/17/2022   Hiatal hernia    Acute gastric erosion    Thyroid  nodule 12/30/2020   Abdominal pain 06/27/2020   Abnormal LFTs 06/27/2020   Bursitis of hip 04/17/2020   Epigastric pain 12/28/2019   Pain in thumb joint with movement, right 11/13/2019   Osteoarthritis of knee 11/13/2019   Symptomatic cholelithiasis    Umbilical hernia without obstruction and without gangrene    Right bundle branch block (RBBB) with incomplete left bundle branch block (LBBB) 09/28/2019   Calculus of gallbladder without cholecystitis without obstruction 09/20/2019   Elevated liver enzymes 09/20/2019   Chronic diarrhea    Benign neoplasm of descending colon    Atherosclerosis of aorta (HCC) 12/13/2018   Coronary artery disease 11/2018   HLD (hyperlipidemia) 11/17/2018   Complete uterovaginal prolapse 11/17/2018   Diarrhea 11/17/2018   Family history of clotting disorder 11/15/2017   Postmenopausal estrogen deficiency 10/06/2015   Spinal stenosis, lumbar region, with neurogenic claudication 04/30/2015   Low back pain 03/25/2015   Screening for breast cancer 09/13/2014   Obesity (BMI 30-39.9) 09/13/2014   HTN (hypertension) 08/31/2013   Osteoporosis 06/27/2007     1. Postoperative state   2. Vagina itching     Patient  doing very well postop.  She is happy with her surgery.  Has recurrence of vaginal itching   Plan:            1.  Patient may resume normal activities with exception of heavy  lifting and no intercourse.  We have discussed her recurrence of rectocele and vaginal cuff prolapse.  Will employ expectant management at this time.  Future repair may be necessary including anterior to posterior suturing to completely close the vagina.  2.  Nuswab performed for vaginal itching-we will be in touch with her about the results. Orders No orders of the defined types were placed in this encounter.   No orders of the defined types were placed in this encounter.     F/U  Return in about 3 months (around 06/26/2024).  Delice Felt, M.D. 03/27/2024 10:58 AM

## 2024-03-28 ENCOUNTER — Other Ambulatory Visit: Payer: Self-pay

## 2024-03-28 DIAGNOSIS — N760 Acute vaginitis: Secondary | ICD-10-CM

## 2024-03-28 LAB — CERVICOVAGINAL ANCILLARY ONLY
Bacterial Vaginitis (gardnerella): POSITIVE — AB
Candida Glabrata: NEGATIVE
Candida Vaginitis: POSITIVE — AB
Comment: NEGATIVE
Comment: NEGATIVE
Comment: NEGATIVE

## 2024-03-28 MED ORDER — METRONIDAZOLE 500 MG PO TABS
500.0000 mg | ORAL_TABLET | Freq: Two times a day (BID) | ORAL | 0 refills | Status: DC
Start: 2024-03-28 — End: 2024-06-27

## 2024-04-01 ENCOUNTER — Encounter: Payer: Self-pay | Admitting: Family

## 2024-04-16 ENCOUNTER — Other Ambulatory Visit: Payer: Self-pay

## 2024-04-16 DIAGNOSIS — S83411A Sprain of medial collateral ligament of right knee, initial encounter: Secondary | ICD-10-CM | POA: Diagnosis not present

## 2024-04-18 ENCOUNTER — Encounter: Payer: Self-pay | Admitting: Cardiology

## 2024-04-19 ENCOUNTER — Other Ambulatory Visit: Payer: Self-pay

## 2024-04-19 MED ORDER — SPIRONOLACTONE 25 MG PO TABS: 25.0000 mg | ORAL_TABLET | Freq: Every day | ORAL | 2 refills | Status: AC

## 2024-04-19 MED ORDER — SPIRONOLACTONE 25 MG PO TABS: 25.0000 mg | ORAL_TABLET | Freq: Every day | ORAL | 3 refills | Status: DC

## 2024-04-20 ENCOUNTER — Other Ambulatory Visit: Payer: Self-pay | Admitting: Family

## 2024-04-20 DIAGNOSIS — R052 Subacute cough: Secondary | ICD-10-CM

## 2024-05-04 ENCOUNTER — Other Ambulatory Visit: Payer: Self-pay | Admitting: Family

## 2024-05-04 DIAGNOSIS — Z1231 Encounter for screening mammogram for malignant neoplasm of breast: Secondary | ICD-10-CM

## 2024-05-07 ENCOUNTER — Ambulatory Visit: Admitting: Pulmonary Disease

## 2024-05-07 ENCOUNTER — Encounter: Payer: Self-pay | Admitting: Pulmonary Disease

## 2024-05-07 VITALS — BP 110/70 | HR 71 | Temp 98.7°F | Ht 62.0 in | Wt 174.0 lb

## 2024-05-07 DIAGNOSIS — R053 Chronic cough: Secondary | ICD-10-CM | POA: Diagnosis not present

## 2024-05-07 DIAGNOSIS — J439 Emphysema, unspecified: Secondary | ICD-10-CM

## 2024-05-07 DIAGNOSIS — Z87891 Personal history of nicotine dependence: Secondary | ICD-10-CM | POA: Diagnosis not present

## 2024-05-07 DIAGNOSIS — I5020 Unspecified systolic (congestive) heart failure: Secondary | ICD-10-CM | POA: Diagnosis not present

## 2024-05-07 LAB — NITRIC OXIDE: Nitric Oxide: 13

## 2024-05-07 NOTE — Progress Notes (Signed)
 Subjective:    Patient ID: Valerie Curry, female    DOB: 06/26/1946, 78 y.o.   MRN: [id number]  Patient Care Team: Calista Catching, FNP as PCP - General (Family Medicine) Constancia Delton, MD as PCP - Cardiology (Cardiology) Timmy Forbes, MD as Consulting Physician (Hematology and Oncology)  Chief Complaint  Patient presents with   New Patient (Initial Visit)    Chest CT: 3/17, chronic cough for the past 6 months, off and on all day and at night, not consistent, trial ran with allergy medications, and was seen by pcp and cardiology. Former smoker, and has mild emphysema, and just scared about the progression because its not getting better.    BACKGROUND: Patient is a 78 year old remote former smoker with a history as noted below, who presents for evaluation of a chronic cough of 6 months duration.  She is kindly referred by Bascom Bossier, FNP.   HPI Discussed the use of AI scribe software for clinical note transcription with the patient, who gave verbal consent to proceed.  History of Present Illness   Valerie Curry is a 78 year old female with mild emphysema who presents with a chronic cough.  She has experienced a dry cough for the last six months, varying from mild to severe, described as feeling like she is 'coughing my lungs up'.  The cough is nonproductive.  It came back just mild throat clearing to violent cough.  It does not appear to last but a few cycles.  The cough is sporadic throughout the day and night without a specific trigger. There has been slight improvement in the past couple of weeks. Allergy medications have been ineffective, and her heart doctor did not attribute the cough to her medications.  Her medical history includes mild emphysema which has been noted to be mild by CT, and she is a former smoker, having quit in 2016 after smoking a pack a day for 50 years. She underwent a lung scan in February or March, for lung cancer screening, this was a lung RADS  2.  She reports no prior pulmonary function tests. She exercises regularly without significant breathlessness.  Her current medications include Entresto  and Farxiga , both of which she has been taking for a considerable time.  She does have a nonischemic cardiomyopathy.  She does not have a pulmonary inhaler but uses Flonase . No recent upper respiratory infections or reflux issues are reported.     Review of Systems A 10 point review of systems was performed and it is as noted above otherwise negative.   Past Medical History:  Diagnosis Date   Acute gastric erosion    Arthritis    Atherosclerosis of aorta (HCC) 12/13/2018   Benign neoplasm of descending colon    Bursitis of hip 04/17/2020   CAD (coronary artery disease) 12/07/2018   a.) LDCT chest 12/07/2018: calcifications in the LAD and RCA territories; b.) LHC 03/17/2022: 10% o-pLAD, 15% pLAD, 20% mRCA --> med mgmt   Calculus of gallbladder without cholecystitis without obstruction 09/20/2019   Car sickness    Complication of anesthesia    a.) delayed emergence; b.) PONV   HFrEF (heart failure with reduced ejection fraction) (HCC) 02/2022   a.) TTE 03/09/2022: EF 30-35%, glob HK, mild LVH, mild LV dil, G1DD; b.) TTE 03/31/2023: EF 30-35%, glob HK, mild LV dil, G1DD, RVSP 35.2, mild MR/PR; c.) cMRI 05/04/2023: EF 43%; d.) TTE 08/11/2023: EF 35-40%, glob HK, mild LV dil, G1DD, RVSP 30.8, triv AR  History of bilateral cataract extraction 2024   History of hiatal hernia    HLD (hyperlipidemia) 11/17/2018   Hypertension    Left bundle branch block    Lump or mass in breast (benign) 2011/2012   Multinodular thyroid  12/30/2020   NICM (nonischemic cardiomyopathy) (HCC)    a.) TTE 03/09/2022: EF 30-35%; b.) TTE 03/31/2023: EF 30-35%; c.) cMRI 05/04/2023: EF 43%, no LGE to suggest ischemia/infarct/scar/infiltrative disease;  d.) TTE 08/11/2023: EF 35-40%   Obesity, unspecified    Osteopenia    a.) on oral bisphosphonate (Boniva) x 1.5  years; declines continued therapy   Osteoporosis 06/27/2007   Personal history of tobacco use, presenting hazards to health    PONV (postoperative nausea and vomiting)    Positive PPD as a child    a.) diagnosic radiographs of the chest were negative; not consistent with findings of TB   Spinal stenosis, lumbar region with neurogenic claudication 04/30/2015   Systemic sclerosis (HCC)    Umbilical hernia without obstruction and without gangrene    Uterine prolapse 11/17/2018    Past Surgical History:  Procedure Laterality Date   ANTERIOR AND POSTERIOR REPAIR N/A 01/23/2024   Procedure: ANTERIOR (CYSTOCELE) AND POSTERIOR REPAIR (RECTOCELE);  Surgeon: Zenobia Hila, MD;  Location: [facility];  Service: Gynecology;  Laterality: N/A;   APPENDECTOMY  1961   BLADDER SUSPENSION N/A 01/23/2024   Procedure: TOT;  Surgeon: Zenobia Hila, MD;  Location: [facility];  Service: Gynecology;  Laterality: N/A;   BREAST BIOPSY Right 09/2010   benign   BREAST BIOPSY Left 09/2011   dilated ducts-benign   BUNIONECTOMY Bilateral 2010   x 2   CATARACT EXTRACTION W/PHACO Right 06/21/2023   Procedure: CATARACT EXTRACTION PHACO AND INTRAOCULAR LENS PLACEMENT (IOC) RIGHT 21.59 01:32.9;  Surgeon: Clair Crews, MD;  Location: [facility];  Service: Ophthalmology;  Laterality: Right;   CATARACT EXTRACTION W/PHACO Left 07/05/2023   Procedure: CATARACT EXTRACTION PHACO AND INTRAOCULAR LENS PLACEMENT (IOC) LEFT 14.23 1:08.3;  Surgeon: Clair Crews, MD;  Location: [facility];  Service: Ophthalmology;  Laterality: Left;   CHOLECYSTECTOMY N/A 10/03/2019   Procedure: LAPAROSCOPIC CHOLECYSTECTOMY;  Surgeon: Mercy Stall, MD;  Location: [facility];  Service: General;  Laterality: N/A;   COLONOSCOPY  06/2004   Dr. Felicita Horns   COLONOSCOPY WITH PROPOFOL  N/A 01/10/2019   Procedure: COLONOSCOPY WITH PROPOFOL ;  Surgeon: Irby Mannan, MD;  Location: [facility];  Service: Endoscopy;   Laterality: N/A;   DECOMPRESSIVE LUMBAR LAMINECTOMY LEVEL 2 N/A 04/30/2015   Procedure: CENTRAL DECOMPRESSIVE LUMBER LAMINECTOMY WITH POSSIBLE FORAMINOTOMIES L3-L4,L4-L5;  Surgeon: Hazle Lites, MD;  Location: [facility];  Service: Orthopedics;  Laterality: N/A;   ERCP W/ SPHICTEROTOMY     ESOPHAGOGASTRODUODENOSCOPY (EGD) WITH PROPOFOL  N/A 04/07/2021   Procedure: ESOPHAGOGASTRODUODENOSCOPY (EGD) WITH PROPOFOL ;  Surgeon: Irby Mannan, MD;  Location: [facility];  Service: Endoscopy;  Laterality: N/A;   LAPAROSCOPIC VAGINAL HYSTERECTOMY WITH SALPINGO OOPHORECTOMY N/A 01/23/2024   Procedure: LAPAROSCOPIC ASSISTED VAGINAL HYSTERECTOMY WITH BILATERAL SALPINGO OOPHORECTOMY, A & P REPAIR TOT;  Surgeon: Zenobia Hila, MD;  Location: [facility];  Service: Gynecology;  Laterality: N/A;   LEFT HEART CATH AND CORONARY ANGIOGRAPHY Left 03/17/2022   Procedure: LEFT HEART CATH AND CORONARY ANGIOGRAPHY;  Surgeon: Sammy Crisp, MD;  Location: [facility];  Service: Cardiovascular;  Laterality: Left;   POLYPECTOMY  04/07/2021   Procedure: POLYPECTOMY;  Surgeon: Irby Mannan, MD;  Location: [facility];  Service: Endoscopy;;   TONSILLECTOMY  1968   TUBAL LIGATION  1974    Patient Active Problem List   Diagnosis Date Noted   Stress incontinence 01/24/2024   Cough 12/27/2023   Urine frequency 10/11/2023   HFrEF (heart failure with reduced ejection fraction) (HCC) 03/17/2022   Hiatal hernia    Acute gastric erosion    Thyroid  nodule 12/30/2020   Abdominal pain 06/27/2020   Abnormal LFTs 06/27/2020   Bursitis of hip 04/17/2020   Epigastric pain 12/28/2019   Pain in thumb joint with movement, right 11/13/2019   Osteoarthritis of knee 11/13/2019   Symptomatic cholelithiasis    Umbilical hernia without obstruction and without gangrene    Right bundle branch block (RBBB) with incomplete left bundle branch block (LBBB) 09/28/2019   Calculus of gallbladder without  cholecystitis without obstruction 09/20/2019   Elevated liver enzymes 09/20/2019   Chronic diarrhea    Benign neoplasm of descending colon    Atherosclerosis of aorta (HCC) 12/13/2018   Coronary artery disease 11/2018   HLD (hyperlipidemia) 11/17/2018   Complete uterovaginal prolapse 11/17/2018   Diarrhea 11/17/2018   Family history of clotting disorder 11/15/2017   Postmenopausal estrogen deficiency 10/06/2015   Spinal stenosis, lumbar region, with neurogenic claudication 04/30/2015   Low back pain 03/25/2015   Screening for breast cancer 09/13/2014   Obesity (BMI 30-39.9) 09/13/2014   HTN (hypertension) 08/31/2013   Osteoporosis 06/27/2007    Family History  Problem Relation Age of Onset   Lung cancer Father    Heart disease Father 63       Cardiac Arrest   Heart disease Mother    Hypertension Mother    Stroke Mother    Other Brother        blood clots   Other Other        hypercoag-family history   Cancer Maternal Aunt        Breast cancer   Breast cancer Maternal Aunt 50   Breast cancer Paternal Aunt 42   Breast cancer Cousin     Social History   Tobacco Use   Smoking status: Former    Current packs/day: 0.00    Average packs/day: 1 pack/day for 50.0 years (50.0 ttl pk-yrs)    Types: Cigarettes    Start date: 11/29/1964    Quit date: 11/29/2014    Years since quitting: 9.4   Smokeless tobacco: Former    Quit date: 04/30/2015  Substance Use Topics   Alcohol use: Yes    Comment: occasional glass of wine    No Known Allergies  Current Meds  Medication Sig   ALLERGY RELIEF 10 MG tablet Take 10 mg by mouth daily.   calcium  citrate (CALCITRATE - DOSED IN MG ELEMENTAL CALCIUM ) 950 (200 Ca) MG tablet Take 200 mg of elemental calcium  by mouth daily.   dapagliflozin  propanediol (FARXIGA ) 10 MG TABS tablet Take 1 tablet (10 mg total) by mouth daily before breakfast.   furosemide  (LASIX ) 20 MG tablet Take 1 tablet (20 mg total) by mouth daily as needed for edema or  fluid.   ibuprofen  (ADVIL ) 200 MG tablet Take 200 mg by mouth every 6 (six) hours as needed.   losartan  (COZAAR ) 100 MG tablet Take 100 mg by mouth daily.   meloxicam  (MOBIC ) 7.5 MG tablet Take 7.5 mg by mouth daily.   metoprolol  succinate (TOPROL  XL) 25 MG 24 hr tablet Take 0.5 tablets (12.5 mg total) by mouth daily.   metroNIDAZOLE  (FLAGYL ) 500 MG tablet Take 1 tablet (500 mg total) by  mouth 2 (two) times daily.   Multiple Vitamins-Minerals (PRESERVISION AREDS 2) CAPS Take by mouth.   rosuvastatin  (CRESTOR ) 20 MG tablet TAKE 1 TABLET BY MOUTH EVERY EVENING   sacubitril -valsartan  (ENTRESTO ) 49-51 MG Take 1 tablet by mouth 2 (two) times daily.   spironolactone  (ALDACTONE ) 25 MG tablet Take 1 tablet (25 mg total) by mouth daily.    Immunization History  Administered Date(s) Administered   Fluad Quad(high Dose 65+) 08/01/2019, 08/29/2020, 09/08/2021, 09/07/2022   Influenza Split 08/28/2012, 09/05/2015   Influenza Whole 07/30/2010   Influenza, High Dose Seasonal PF 08/25/2016, 08/18/2017, 08/28/2018   Influenza,inj,Quad PF,6+ Mos 08/16/2013   Influenza-Unspecified 09/30/2023   PFIZER Comirnaty(Gray Top)Covid-19 Tri-Sucrose Vaccine 04/09/2021   PFIZER(Purple Top)SARS-COV-2 Vaccination 12/04/2019, 12/25/2019, 09/04/2020   Pneumococcal Conjugate-13 09/13/2014   Pneumococcal Polysaccharide-23 07/04/2006, 10/06/2015   Td 11/30/2003   Zoster, Live 12/12/2007        Objective:     BP 110/70 (BP Location: Left Arm, Patient Position: Sitting, Cuff Size: Large)   Pulse 71   Temp 98.7 F (37.1 C) (Oral)   Ht 5\' 2"  (1.575 m)   Wt 174 lb (78.9 kg)   SpO2 96%   BMI 31.83 kg/m   SpO2: 96 %  GENERAL: Well-developed, overweight woman, no acute distress, fully ambulatory, no conversational dyspnea. HEAD: Normocephalic, atraumatic.  EYES: Pupils equal, round, reactive to light.  No scleral icterus.  MOUTH: Dentition intact few chipped teeth.  Oral mucosa moist.  No thrush. NECK: Supple. No  thyromegaly. Trachea midline. No JVD.  No adenopathy. PULMONARY: Good air entry bilaterally.  No adventitious sounds. CARDIOVASCULAR: S1 and S2. Regular rate and rhythm.  ABDOMEN: Obese otherwise benign. MUSCULOSKELETAL: No joint deformity, no clubbing, no edema.  NEUROLOGIC: No overt focal deficit, no gait disturbance, speech is fluent. SKIN: Intact,warm,dry. PSYCH: Mood and behavior normal.  Lab Results  Component Value Date   NITRICOXIDE 13 05/07/2024  *No evidence of type II inflammation       Assessment & Plan:     ICD-10-CM   1. Chronic coughing  R05.3 Nitric oxide    Pulmonary function test    2. Pulmonary emphysema, unspecified emphysema type (HCC)  J43.9 Pulmonary function test    3. HFrEF (heart failure with reduced ejection fraction) (HCC)  I50.20     4. Former smoker  Z87.891       Orders Placed This Encounter  Procedures   Nitric oxide   Pulmonary function test    Standing Status:   Future    Expiration Date:   05/07/2025    Where should this test be performed?:   Outpatient Pulmonary    What type of PFT is being ordered?:   Full PFT   Discussion:    Cough Chronic dry cough for six months, intermittent and sporadic, with no clear triggers. No associated upper respiratory infection or reflux. Possible side effect of Entresto , which contains an ACE inhibitor known to cause/aggravate cough.  ACE inhibitor related cough can occur at any time during therapy even after years of being on therapy.  Improvement noted in recent weeks. Differential includes medication side effect, mild emphysema, or other pulmonary issues. Lungs sound clear with low nitric oxide levels, reducing likelihood of asthma. - Order pulmonary function tests. - Hold off on prescribing inhalers until results of pulmonary function tests are available. - Evaluate Entresto  as a potential cause if other causes are ruled out.  Pulmonary emphysema Mild emphysema, former smoker with a 1 pack per day  history,  quit in 2016. Recent lung scan shows no significant changes or concerning findings. No current breathing difficulties reported, and exercise tolerance is good. She is concerned about managing emphysema to prevent worsening. - Monitor emphysema status with pulmonary function tests.  Follow-up Follow-up needed to review results of pulmonary function tests and assess ongoing management of cough and emphysema. - Schedule follow-up appointment in 4-6 weeks to discuss pulmonary function test results and further management.      Advised if symptoms do not improve or worsen, to please contact office for sooner follow up or seek emergency care.    I spent 45 minutes of dedicated to the care of this patient on the date of this encounter to include pre-visit review of records, face-to-face time with the patient discussing conditions above, post visit ordering of testing, clinical documentation with the electronic health record, making appropriate referrals as documented, and communicating necessary findings to members of the patients care team.   C. Chloe Counter, MD Advanced Bronchoscopy P[facility]    *This note was dictated using voice recognition software/Dragon.  Despite best efforts to proofread, errors can occur which can change the meaning. Any transcriptional errors that result from this process are unintentional and may not be fully corrected at the time of dictation.

## 2024-05-07 NOTE — Patient Instructions (Signed)
 VISIT SUMMARY:  Today, you were seen for a chronic dry cough that has been ongoing for six months. We discussed your symptoms, medical history, and current medications. Your lungs were examined and sounded clear, and we reviewed your recent lung scan results.  YOUR PLAN:  -CHRONIC COUGH: A chronic cough is a cough that lasts for an extended period, in this case, six months. It can be caused by various factors, including medications, underlying lung conditions, or other health issues. We will conduct pulmonary function tests to better understand the cause of your cough. For now, we will not prescribe any inhalers until we have the test results. If the tests do not reveal any other causes, we will evaluate whether your medication, Entresto , might be contributing to the cough.  -EMPHYSEMA: Emphysema is a lung condition that causes shortness of breath due to damage to the air sacs in the lungs. You have mild emphysema, and your recent lung scan did not show any significant changes. You are currently managing well without significant breathing difficulties. We will monitor your emphysema with pulmonary function tests to ensure it is not worsening.  INSTRUCTIONS:  Please schedule a follow-up appointment in 4-6 weeks to review the results of your pulmonary function tests and discuss further management of your cough and emphysema.

## 2024-05-23 ENCOUNTER — Other Ambulatory Visit: Payer: Self-pay | Admitting: Cardiology

## 2024-05-24 ENCOUNTER — Ambulatory Visit
Admission: RE | Admit: 2024-05-24 | Discharge: 2024-05-24 | Disposition: A | Discharge status: Home / Self Care | Source: Ambulatory Visit | Attending: Family | Admitting: Family

## 2024-05-24 DIAGNOSIS — Z1231 Encounter for screening mammogram for malignant neoplasm of breast: Secondary | ICD-10-CM | POA: Diagnosis not present

## 2024-05-28 ENCOUNTER — Encounter: Payer: Self-pay | Admitting: Obstetrics and Gynecology

## 2024-05-28 DIAGNOSIS — B379 Candidiasis, unspecified: Secondary | ICD-10-CM

## 2024-05-28 MED ORDER — FLUCONAZOLE 150 MG PO TABS: 150.0000 mg | ORAL_TABLET | ORAL | 3 refills | Status: DC

## 2024-05-29 ENCOUNTER — Encounter: Payer: PPO | Admitting: Family

## 2024-05-31 ENCOUNTER — Ambulatory Visit: Admitting: Pulmonary Disease

## 2024-06-04 ENCOUNTER — Encounter: Payer: Self-pay | Admitting: Family

## 2024-06-04 ENCOUNTER — Ambulatory Visit (INDEPENDENT_AMBULATORY_CARE_PROVIDER_SITE_OTHER): Admitting: Family

## 2024-06-04 VITALS — BP 118/68 | HR 71 | Temp 97.8°F | Wt 172.6 lb

## 2024-06-04 DIAGNOSIS — R052 Subacute cough: Secondary | ICD-10-CM | POA: Diagnosis not present

## 2024-06-04 DIAGNOSIS — Z131 Encounter for screening for diabetes mellitus: Secondary | ICD-10-CM

## 2024-06-04 DIAGNOSIS — Z13220 Encounter for screening for lipoid disorders: Secondary | ICD-10-CM

## 2024-06-04 DIAGNOSIS — Z0000 Encounter for general adult medical examination without abnormal findings: Secondary | ICD-10-CM

## 2024-06-04 DIAGNOSIS — Z8639 Personal history of other endocrine, nutritional and metabolic disease: Secondary | ICD-10-CM

## 2024-06-04 DIAGNOSIS — E041 Nontoxic single thyroid nodule: Secondary | ICD-10-CM

## 2024-06-04 LAB — LIPID PANEL
Cholesterol: 139 mg/dL (ref 0–200)
HDL: 63.3 mg/dL (ref >39.00)
LDL Cholesterol: 55 mg/dL (ref 0–99)
NonHDL: 75.88
Total CHOL/HDL Ratio: 2
Triglycerides: 102 mg/dL (ref 0.0–149.0)
VLDL: 20.4 mg/dL (ref 0.0–40.0)

## 2024-06-04 LAB — COMPREHENSIVE METABOLIC PANEL WITH GFR
ALT: 12 U/L (ref 0–35)
AST: 21 U/L (ref 0–37)
Albumin: 4.4 g/dL (ref 3.5–5.2)
Alkaline Phosphatase: 59 U/L (ref 39–117)
BUN: 20 mg/dL (ref 6–23)
CO2: 29 meq/L (ref 19–32)
Calcium: 9.7 mg/dL (ref 8.4–10.5)
Chloride: 103 meq/L (ref 96–112)
Creatinine, Ser: 0.78 mg/dL (ref 0.40–1.20)
GFR: 73.21 mL/min (ref >60.00)
Glucose, Bld: 89 mg/dL (ref 70–99)
Potassium: 4.1 meq/L (ref 3.5–5.1)
Sodium: 140 meq/L (ref 135–145)
Total Bilirubin: 0.7 mg/dL (ref 0.2–1.2)
Total Protein: 6.6 g/dL (ref 6.0–8.3)

## 2024-06-04 LAB — VITAMIN D 25 HYDROXY (VIT D DEFICIENCY, FRACTURES): VITD: 35.62 ng/mL (ref 30.00–100.00)

## 2024-06-04 LAB — HEMOGLOBIN A1C: Hgb A1c MFr Bld: 5.8 % (ref 4.6–6.5)

## 2024-06-04 LAB — TSH: TSH: 1.13 u[IU]/mL (ref 0.35–5.50)

## 2024-06-04 NOTE — Assessment & Plan Note (Signed)
 Deferred clinical breast exam due to patient preference.  Deferred pelvic exam patient is no longer  screening for cervical cancer.  History of hysterectomy.  She will schedule DEXA next year

## 2024-06-04 NOTE — Patient Instructions (Signed)
 Referral for colonoscopy, ultrasound of thyroid .   Let us  know if you dont hear back within a week in regards to an appointment being scheduled.   So that you are aware, if you are [facility] MyChart user , please pay attention to your MyChart messages as you may receive a MyChart message with a phone number to call and schedule this test/appointment own your own from our referral coordinator. This is a new process so I do not want you to miss this message.  If you are not a MyChart user, you will receive a phone call.

## 2024-06-04 NOTE — Progress Notes (Signed)
 Assessment & Plan:  Annual physical exam Assessment & Plan: Deferred clinical breast exam due to patient preference.  Deferred pelvic exam patient is no longer  screening for cervical cancer.  History of hysterectomy.  She will schedule DEXA next year  Orders: -     Ambulatory referral to Gastroenterology -     Comprehensive metabolic panel with GFR -     Lipid panel -     Hemoglobin A1c -     TSH  Thyroid  nodule Assessment & Plan: Pending repeat US  thyroid . Consider discontinuation in 2026 after 5 years stability.   Orders: -     US  THYROID ; Future -     TSH  History of vitamin D  deficiency -     VITAMIN D  25 Hydroxy (Vit-D Deficiency, Fractures)  Subacute cough Assessment & Plan: Chronic, etiology unclear. Question if medication related. History of smoking. No improvement with antihistamine, PPI. Will follow as established with Dr Tamea.       Return precautions given.   Risks, benefits, and alternatives of the medications and treatment plan prescribed today were discussed, and patient expressed understanding.   Education regarding symptom management and diagnosis given to patient on AVS either electronically or printed.  No follow-ups on file.  Valerie Northern, FNP  Subjective:    Patient ID: Valerie Curry, female    DOB: 1946/04/30, 78 y.o.   MRN: [id number]  CC: [name] is a 78 y.o. female who presents today for physical exam.    HPI: Feels well today No new complains She has chronic cough. No improvement with antihistamine, PPI.  Cough is dry and episodic.   No fever, weight gain.    Recently seen by Dr Tamea 05/07/24 for chronic cough; pending PFTs Discussed possible side effect of entresto   Colorectal Cancer Screening: due 01/10/2019; repeat in 5 years Breast Cancer Screening: Mammogram UTD , 04/2024  Cervical Cancer Screening: no longer screening; h/o hysterectomy  Bone Health screening/DEXA for 65+: due; declines until next  year Lung Cancer Screening: former smoker; ct  lung cancer screen 02/13/24          Tetanus - due        Pneumococcal - Complete  Exercise: Gets regular exercise.   Alcohol use:  occasional  Health Maintenance  Topic Date Due   Zoster (Shingles) Vaccine (1 of 2) 11/25/1996   DTaP/Tdap/Td vaccine (2 - Tdap) 11/29/2013   Medicare Annual Wellness Visit  03/03/2018   COVID-19 Vaccine (5 - 2024-25 season) 07/31/2023   Colon Cancer Screening  01/11/2024   Flu Shot  06/29/2024   Screening for Lung Cancer  02/12/2025   Pneumococcal Vaccine for age over 29  Completed   DEXA scan (bone density measurement)  Completed   Hepatitis C Screening  Completed   Hepatitis B Vaccine  Aged Out   HPV Vaccine  Aged Out   Meningitis B Vaccine  Aged Out    ALLERGIES: Patient has no known allergies.  Current Outpatient Medications on File Prior to Visit  Medication Sig Dispense Refill   ALLERGY RELIEF 10 MG tablet Take 10 mg by mouth daily. (Patient taking differently: Take 10 mg by mouth daily as needed for allergies.)     calcium  citrate (CALCITRATE - DOSED IN MG ELEMENTAL CALCIUM ) 950 (200 Ca) MG tablet Take 200 mg of elemental calcium  by mouth daily.     dapagliflozin  propanediol (FARXIGA ) 10 MG TABS tablet Take 1 tablet (10 mg total) by mouth daily before breakfast.  100 tablet 3   fluconazole  (DIFLUCAN ) 150 MG tablet Take 1 tablet (150 mg total) by mouth every 3 (three) days. For three doses 3 tablet 3   furosemide  (LASIX ) 20 MG tablet Take 1 tablet (20 mg total) by mouth daily as needed for edema or fluid. 30 tablet 3   ibuprofen  (ADVIL ) 200 MG tablet Take 200 mg by mouth every 6 (six) hours as needed.     losartan  (COZAAR ) 100 MG tablet Take 100 mg by mouth daily.     meloxicam  (MOBIC ) 7.5 MG tablet Take 7.5 mg by mouth daily. (Patient taking differently: Take 7.5 mg by mouth as needed for pain.)     metoprolol  succinate (TOPROL -XL) 25 MG 24 hr tablet TAKE 1/2 TABLET BY MOUTH ONCE DAILY 45 tablet  1   metroNIDAZOLE  (FLAGYL ) 500 MG tablet Take 1 tablet (500 mg total) by mouth 2 (two) times daily. 14 tablet 0   Multiple Vitamins-Minerals (PRESERVISION AREDS 2) CAPS Take by mouth.     rosuvastatin  (CRESTOR ) 20 MG tablet TAKE 1 TABLET BY MOUTH EVERY EVENING 90 tablet 3   sacubitril -valsartan  (ENTRESTO ) 49-51 MG Take 1 tablet by mouth 2 (two) times daily. 60 tablet 11   spironolactone  (ALDACTONE ) 25 MG tablet Take 1 tablet (25 mg total) by mouth daily. 100 tablet 2   No current facility-administered medications on file prior to visit.    Review of Systems  Constitutional:  Negative for chills and fever.  HENT:  Negative for congestion.   Respiratory:  Positive for cough. Negative for shortness of breath and wheezing.   Cardiovascular:  Negative for chest pain and palpitations.  Gastrointestinal:  Negative for nausea and vomiting.      Objective:    BP 118/68   Pulse 71   Temp 97.8 F (36.6 C)   Wt 172 lb 9.6 oz (78.3 kg)   SpO2 96%   BMI 31.57 kg/m   BP Readings from Last 3 Encounters:  06/04/24 118/68  05/07/24 110/70  03/27/24 129/82   Wt Readings from Last 3 Encounters:  06/04/24 172 lb 9.6 oz (78.3 kg)  05/07/24 174 lb (78.9 kg)  03/27/24 173 lb 4.8 oz (78.6 kg)    Physical Exam Vitals reviewed.  Constitutional:      Appearance: Normal appearance. She is well-developed.  Eyes:     Conjunctiva/sclera: Conjunctivae normal.  Neck:     Thyroid : No thyroid  mass or thyromegaly.  Cardiovascular:     Rate and Rhythm: Normal rate and regular rhythm.     Pulses: Normal pulses.     Heart sounds: Normal heart sounds.  Pulmonary:     Effort: Pulmonary effort is normal.     Breath sounds: Normal breath sounds. No wheezing, rhonchi or rales.  Abdominal:     General: Bowel sounds are normal. There is no distension.     Palpations: Abdomen is soft. Abdomen is not rigid. There is no fluid wave or mass.     Tenderness: There is no abdominal tenderness. There is no  guarding or rebound.  Lymphadenopathy:     Head:     Right side of head: No submental, submandibular, tonsillar, preauricular, posterior auricular or occipital adenopathy.     Left side of head: No submental, submandibular, tonsillar, preauricular, posterior auricular or occipital adenopathy.     Cervical: No cervical adenopathy.  Skin:    General: Skin is warm and dry.  Neurological:     Mental Status: She is alert.  Psychiatric:  Speech: Speech normal.        Behavior: Behavior normal.        Thought Content: Thought content normal.

## 2024-06-04 NOTE — Assessment & Plan Note (Signed)
 Chronic, etiology unclear. Question if medication related. History of smoking. No improvement with antihistamine, PPI. Will follow as established with Dr Tamea.

## 2024-06-04 NOTE — Assessment & Plan Note (Addendum)
 Pending repeat US  thyroid . Consider discontinuation in 2026 after 5 years stability.

## 2024-06-05 ENCOUNTER — Ambulatory Visit: Payer: Self-pay | Admitting: Family

## 2024-06-05 DIAGNOSIS — E041 Nontoxic single thyroid nodule: Secondary | ICD-10-CM

## 2024-06-19 ENCOUNTER — Ambulatory Visit
Admission: RE | Admit: 2024-06-19 | Discharge: 2024-06-19 | Disposition: A | Discharge status: Home / Self Care | Source: Ambulatory Visit | Attending: Family | Admitting: Family

## 2024-06-19 DIAGNOSIS — E042 Nontoxic multinodular goiter: Secondary | ICD-10-CM | POA: Diagnosis not present

## 2024-06-19 DIAGNOSIS — E041 Nontoxic single thyroid nodule: Secondary | ICD-10-CM | POA: Diagnosis not present

## 2024-06-27 ENCOUNTER — Ambulatory Visit: Admitting: Obstetrics and Gynecology

## 2024-06-27 ENCOUNTER — Encounter: Payer: Self-pay | Admitting: Obstetrics and Gynecology

## 2024-06-27 VITALS — BP 137/82 | HR 76 | Ht 62.0 in | Wt 175.5 lb

## 2024-06-27 DIAGNOSIS — Z09 Encounter for follow-up examination after completed treatment for conditions other than malignant neoplasm: Secondary | ICD-10-CM

## 2024-06-27 DIAGNOSIS — N8189 Other female genital prolapse: Secondary | ICD-10-CM | POA: Diagnosis not present

## 2024-06-27 DIAGNOSIS — Z9889 Other specified postprocedural states: Secondary | ICD-10-CM

## 2024-06-27 NOTE — Progress Notes (Signed)
 Patient presents for 5 month postop follow-up following LAVH/BSO/A&P with sling. She states doing well since surgery however does report some occasional spotting and vaginal prolapse as previously mentioned. No additional concerns at this time.

## 2024-06-27 NOTE — Progress Notes (Signed)
 HPI:      Ms. Valerie Curry is a 78 y.o. G2P2 who LMP was No LMP recorded. Patient is postmenopausal.  Subjective:   She presents today she is 5 months from LAVH AMP repair TOT and LeFort.  She states that she has something bulging from the vagina but not nearly as bad as it was before surgery.  She says it does not bother her or affect her life.  She is not having issues with urine loss. She does state that she occasionally has some spotting.    Hx: The following portions of the patient's history were reviewed and updated as appropriate:             She  has a past medical history of Acute gastric erosion, Arthritis, Atherosclerosis of aorta (HCC) (12/13/2018), Benign neoplasm of descending colon, Bursitis of hip (04/17/2020), CAD (coronary artery disease) (12/07/2018), Calculus of gallbladder without cholecystitis without obstruction (09/20/2019), Car sickness, Complication of anesthesia, HFrEF (heart failure with reduced ejection fraction) (HCC) (02/2022), History of bilateral cataract extraction (2024), History of hiatal hernia, HLD (hyperlipidemia) (11/17/2018), Hypertension, Left bundle branch block, Lump or mass in breast (benign) (2011/2012), Multinodular thyroid  (12/30/2020), NICM (nonischemic cardiomyopathy) (HCC), Obesity, unspecified, Osteopenia, Osteoporosis (06/27/2007), Personal history of tobacco use, presenting hazards to health, PONV (postoperative nausea and vomiting), Positive PPD as a child, Spinal stenosis, lumbar region with neurogenic claudication (04/30/2015), Systemic sclerosis (HCC), Umbilical hernia without obstruction and without gangrene, and Uterine prolapse (11/17/2018). She does not have any pertinent problems on file. She  has a past surgical history that includes Tonsillectomy (1968); Colonoscopy (06/2004); Bunionectomy (Bilateral, 2010); Appendectomy (1961); Tubal ligation (1974); Decompressive lumbar laminectomy level 2 (N/A, 04/30/2015); Colonoscopy with propofol   (N/A, 01/10/2019); Cholecystectomy (N/A, 10/03/2019); Breast biopsy (Right, 09/2010); Breast biopsy (Left, 09/2011); Esophagogastroduodenoscopy (egd) with propofol  (N/A, 04/07/2021); polypectomy (04/07/2021); ERCP w/ sphicterotomy; LEFT HEART CATH AND CORONARY ANGIOGRAPHY (Left, 03/17/2022); Cataract extraction w/PHACO (Right, 06/21/2023); Cataract extraction w/PHACO (Left, 07/05/2023); Laparoscopic vaginal hysterectomy with salpingo oophorectomy (N/A, 01/23/2024); Anterior and posterior repair (N/A, 01/23/2024); and Bladder suspension (N/A, 01/23/2024). Her family history includes Breast cancer in her cousin; Breast cancer (age of onset: 74) in her maternal aunt and paternal aunt; Cancer in her maternal aunt; Heart disease in her mother; Heart disease (age of onset: 20) in her father; Hypertension in her mother; Lung cancer in her father; Other in her brother and another family member; Stroke in her mother. She  reports that she quit smoking about 9 years ago. Her smoking use included cigarettes. She started smoking about 59 years ago. She has a 50 pack-year smoking history. She quit smokeless tobacco use about 9 years ago. She reports current alcohol use. She reports that she does not use drugs. She has a current medication list which includes the following prescription(s): allergy relief, calcium  citrate, dapagliflozin  propanediol, furosemide , ibuprofen , losartan , meloxicam , metoprolol  succinate, preservision areds 2, rosuvastatin , sacubitril -valsartan , and spironolactone . She has no known allergies.       Review of Systems:  Review of Systems  Constitutional: Denied constitutional symptoms, night sweats, recent illness, fatigue, fever, insomnia and weight loss.  Eyes: Denied eye symptoms, eye pain, photophobia, vision change and visual disturbance.  Ears/Nose/Throat/Neck: Denied ear, nose, throat or neck symptoms, hearing loss, nasal discharge, sinus congestion and sore throat.  Cardiovascular: Denied  cardiovascular symptoms, arrhythmia, chest pain/pressure, edema, exercise intolerance, orthopnea and palpitations.  Respiratory: Denied pulmonary symptoms, asthma, pleuritic pain, productive sputum, cough, dyspnea and wheezing.  Gastrointestinal: Denied, gastro-esophageal reflux, melena, nausea and  vomiting.  Genitourinary: See HPI for additional information.  Musculoskeletal: Denied musculoskeletal symptoms, stiffness, swelling, muscle weakness and myalgia.  Dermatologic: Denied dermatology symptoms, rash and scar.  Neurologic: Denied neurology symptoms, dizziness, headache, neck pain and syncope.  Psychiatric: Denied psychiatric symptoms, anxiety and depression.  Endocrine: Denied endocrine symptoms including hot flashes and night sweats.   Meds:   Current Outpatient Medications on File Prior to Visit  Medication Sig Dispense Refill   ALLERGY RELIEF 10 MG tablet Take 10 mg by mouth daily. (Patient taking differently: Take 10 mg by mouth daily as needed for allergies.)     calcium  citrate (CALCITRATE - DOSED IN MG ELEMENTAL CALCIUM ) 950 (200 Ca) MG tablet Take 200 mg of elemental calcium  by mouth daily.     dapagliflozin  propanediol (FARXIGA ) 10 MG TABS tablet Take 1 tablet (10 mg total) by mouth daily before breakfast. 100 tablet 3   furosemide  (LASIX ) 20 MG tablet Take 1 tablet (20 mg total) by mouth daily as needed for edema or fluid. 30 tablet 3   ibuprofen  (ADVIL ) 200 MG tablet Take 200 mg by mouth every 6 (six) hours as needed.     losartan  (COZAAR ) 100 MG tablet Take 100 mg by mouth daily.     meloxicam  (MOBIC ) 7.5 MG tablet Take 7.5 mg by mouth daily. (Patient taking differently: Take 7.5 mg by mouth as needed for pain.)     metoprolol  succinate (TOPROL -XL) 25 MG 24 hr tablet TAKE 1/2 TABLET BY MOUTH ONCE DAILY 45 tablet 1   Multiple Vitamins-Minerals (PRESERVISION AREDS 2) CAPS Take by mouth.     rosuvastatin  (CRESTOR ) 20 MG tablet TAKE 1 TABLET BY MOUTH EVERY EVENING 90 tablet 3    sacubitril -valsartan  (ENTRESTO ) 49-51 MG Take 1 tablet by mouth 2 (two) times daily. 60 tablet 11   spironolactone  (ALDACTONE ) 25 MG tablet Take 1 tablet (25 mg total) by mouth daily. 100 tablet 2   No current facility-administered medications on file prior to visit.      Objective:     Vitals:   06/27/24 1053  BP: 137/82  Pulse: 76   Filed Weights   06/27/24 1053  Weight: 175 lb 8 oz (79.6 kg)              Examination reveals what appears to be of fourth degree cystocele possibly below the area where LeFort was performed.  Second-degree rectocele. I find no evidence of vaginal erosion          Assessment:    G2P2 Patient Active Problem List   Diagnosis Date Noted   Stress incontinence 01/24/2024   Cough 12/27/2023   Urine frequency 10/11/2023   HFrEF (heart failure with reduced ejection fraction) (HCC) 03/17/2022   Hiatal hernia    Acute gastric erosion    Thyroid  nodule 12/30/2020   Abdominal pain 06/27/2020   Abnormal LFTs 06/27/2020   Bursitis of hip 04/17/2020   Epigastric pain 12/28/2019   Pain in thumb joint with movement, right 11/13/2019   Osteoarthritis of knee 11/13/2019   Symptomatic cholelithiasis    Umbilical hernia without obstruction and without gangrene    Right bundle branch block (RBBB) with incomplete left bundle branch block (LBBB) 09/28/2019   Calculus of gallbladder without cholecystitis without obstruction 09/20/2019   Elevated liver enzymes 09/20/2019   Chronic diarrhea    Benign neoplasm of descending colon    Atherosclerosis of aorta (HCC) 12/13/2018   Coronary artery disease 11/2018   HLD (hyperlipidemia) 11/17/2018   Complete uterovaginal prolapse  11/17/2018   Diarrhea 11/17/2018   Family history of clotting disorder 11/15/2017   Postmenopausal estrogen deficiency 10/06/2015   Spinal stenosis, lumbar region, with neurogenic claudication 04/30/2015   Low back pain 03/25/2015   Annual physical exam 09/13/2014   Obesity (BMI  30-39.9) 09/13/2014   HTN (hypertension) 08/31/2013   Osteoporosis 06/27/2007     1. Postoperative state   2. Pelvic relaxation     Patient reports she is doing well and has no symptoms at this time.  She is aware of the vaginal bulge of her bladder but says that this has not changed her life.   Plan:            1.  We have discussed continued presence of fourth degree cystocele.  She understands that likely further surgery will be required if she begins having significant symptoms.  Possibility of pessary discussed although fitting appropriately may be difficult.  2.  Plan use of twice weekly estrogen vaginal cream to alleviate spotting. Orders No orders of the defined types were placed in this encounter.   No orders of the defined types were placed in this encounter.     F/U  Return in about 1 year (around 06/27/2025).  Alm DOROTHA Sar, M.D. 06/27/2024 11:21 AM

## 2024-07-05 ENCOUNTER — Ambulatory Visit: Admitting: Pulmonary Disease

## 2024-07-05 ENCOUNTER — Encounter: Payer: Self-pay | Admitting: Pulmonary Disease

## 2024-07-05 VITALS — BP 146/100 | HR 75 | Temp 98.5°F | Ht 62.0 in | Wt 175.0 lb

## 2024-07-05 DIAGNOSIS — I5020 Unspecified systolic (congestive) heart failure: Secondary | ICD-10-CM

## 2024-07-05 DIAGNOSIS — J439 Emphysema, unspecified: Secondary | ICD-10-CM

## 2024-07-05 DIAGNOSIS — Z87891 Personal history of nicotine dependence: Secondary | ICD-10-CM

## 2024-07-05 DIAGNOSIS — R053 Chronic cough: Secondary | ICD-10-CM

## 2024-07-05 LAB — PULMONARY FUNCTION TEST
DL/VA % pred: 95 %
DL/VA: 3.99 ml/min/mmHg/L
DLCO unc % pred: 87 %
DLCO unc: 15.28 ml/min/mmHg
FEF 25-75 Post: 2.03 L/s
FEF 25-75 Pre: 1.7 L/s
FEF2575-%Change-Post: 19 %
FEF2575-%Pred-Post: 140 %
FEF2575-%Pred-Pre: 117 %
FEV1-%Change-Post: 3 %
FEV1-%Pred-Post: 89 %
FEV1-%Pred-Pre: 86 %
FEV1-Post: 1.67 L
FEV1-Pre: 1.61 L
FEV1FVC-%Change-Post: -2 %
FEV1FVC-%Pred-Pre: 109 %
FEV6-%Change-Post: 6 %
FEV6-%Pred-Post: 88 %
FEV6-%Pred-Pre: 83 %
FEV6-Post: 2.09 L
FEV6-Pre: 1.97 L
FEV6FVC-%Pred-Post: 105 %
FEV6FVC-%Pred-Pre: 105 %
FVC-%Change-Post: 6 %
FVC-%Pred-Post: 83 %
FVC-%Pred-Pre: 79 %
FVC-Post: 2.09 L
FVC-Pre: 1.97 L
Post FEV1/FVC ratio: 80 %
Post FEV6/FVC ratio: 100 %
Pre FEV1/FVC ratio: 82 %
Pre FEV6/FVC Ratio: 100 %
RV % pred: 118 %
RV: 2.64 L
TLC % pred: 100 %
TLC: 4.8 L

## 2024-07-05 NOTE — Progress Notes (Signed)
 Subjective:    Patient ID: Valerie Curry, female    DOB: 04/23/1946, 78 y.o.   MRN: [id number]  Patient Care Team: Dineen Rollene MATSU, FNP as PCP - General (Family Medicine) Darliss Rogue, MD as PCP - Cardiology (Cardiology) Babara Call, MD as Consulting Physician (Hematology and Oncology)  Chief Complaint  Patient presents with   Follow-up    BACKGROUND/INTERVAL:Patient is a 78 year old remote former smoker with a history as noted below, who presents for follow-up of a chronic cough of over 6 months duration.  She was initially seen on 07 May 2024.  HPI Discussed the use of AI scribe software for clinical note transcription with the patient, who gave verbal consent to proceed.  History of Present Illness   Valerie Curry is a 78 year old female who presents with a persistent cough.  She has been experiencing a persistent cough that has remained about the same, with a slight decrease in severity. Initially described as 'really horrible,' the cough has improved somewhat over time.  She is currently on a medication regimen that includes Entresto  cough may be related to the ACE inhibitor component of this medication.  She quit smoking in 2016 after smoking a pack a day and is concerned about the potential impact of her smoking history on her lung health. She has been participating in lung cancer screening for nine years, with plans to continue for another year. Her last screening showed no concerning findings.  She mentions that the cough could have been exacerbated by allergies, as this year had a particularly severe pollen season.   She had PFTs today which were essentially unremarkable.  FEV1 was 1.61 L or 86% predicted, FVC 1.97 L or 79% predicted, FEV1/FVC 82%, no bronchodilator response.  Diffusion capacity was normal.  Lung volumes were normal.    Review of Systems A 10 point review of systems was performed and it is as noted above otherwise negative.   Patient Active  Problem List   Diagnosis Date Noted   Stress incontinence 01/24/2024   Cough 12/27/2023   Urine frequency 10/11/2023   HFrEF (heart failure with reduced ejection fraction) (HCC) 03/17/2022   Hiatal hernia    Acute gastric erosion    Thyroid  nodule 12/30/2020   Abdominal pain 06/27/2020   Abnormal LFTs 06/27/2020   Bursitis of hip 04/17/2020   Epigastric pain 12/28/2019   Pain in thumb joint with movement, right 11/13/2019   Osteoarthritis of knee 11/13/2019   Symptomatic cholelithiasis    Umbilical hernia without obstruction and without gangrene    Right bundle branch block (RBBB) with incomplete left bundle branch block (LBBB) 09/28/2019   Calculus of gallbladder without cholecystitis without obstruction 09/20/2019   Elevated liver enzymes 09/20/2019   Chronic diarrhea    Benign neoplasm of descending colon    Atherosclerosis of aorta (HCC) 12/13/2018   Coronary artery disease 11/2018   HLD (hyperlipidemia) 11/17/2018   Complete uterovaginal prolapse 11/17/2018   Diarrhea 11/17/2018   Family history of clotting disorder 11/15/2017   Postmenopausal estrogen deficiency 10/06/2015   Spinal stenosis, lumbar region, with neurogenic claudication 04/30/2015   Low back pain 03/25/2015   Annual physical exam 09/13/2014   Obesity (BMI 30-39.9) 09/13/2014   HTN (hypertension) 08/31/2013   Osteoporosis 06/27/2007    Social History   Tobacco Use   Smoking status: Former    Current packs/day: 0.00    Average packs/day: 1 pack/day for 50.0 years (50.0 ttl pk-yrs)    Types: Cigarettes  Start date: 11/29/1964    Quit date: 11/29/2014    Years since quitting: 9.6   Smokeless tobacco: Former    Quit date: 04/30/2015  Substance Use Topics   Alcohol use: Yes    Comment: occasional glass of wine    No Known Allergies  Current Meds  Medication Sig   ALLERGY RELIEF 10 MG tablet Take 10 mg by mouth daily. (Patient taking differently: Take 10 mg by mouth daily as needed for allergies.)    calcium  citrate (CALCITRATE - DOSED IN MG ELEMENTAL CALCIUM ) 950 (200 Ca) MG tablet Take 200 mg of elemental calcium  by mouth daily.   dapagliflozin  propanediol (FARXIGA ) 10 MG TABS tablet Take 1 tablet (10 mg total) by mouth daily before breakfast.   furosemide  (LASIX ) 20 MG tablet Take 1 tablet (20 mg total) by mouth daily as needed for edema or fluid.   ibuprofen  (ADVIL ) 200 MG tablet Take 200 mg by mouth every 6 (six) hours as needed.   losartan  (COZAAR ) 100 MG tablet Take 100 mg by mouth daily.   meloxicam  (MOBIC ) 7.5 MG tablet Take 7.5 mg by mouth daily. (Patient taking differently: Take 7.5 mg by mouth as needed for pain.)   metoprolol  succinate (TOPROL -XL) 25 MG 24 hr tablet TAKE 1/2 TABLET BY MOUTH ONCE DAILY   Multiple Vitamins-Minerals (PRESERVISION AREDS 2) CAPS Take by mouth.   rosuvastatin  (CRESTOR ) 20 MG tablet TAKE 1 TABLET BY MOUTH EVERY EVENING   sacubitril -valsartan  (ENTRESTO ) 49-51 MG Take 1 tablet by mouth 2 (two) times daily.   spironolactone  (ALDACTONE ) 25 MG tablet Take 1 tablet (25 mg total) by mouth daily.    Immunization History  Administered Date(s) Administered   Fluad Quad(high Dose 65+) 08/01/2019, 08/29/2020, 09/08/2021, 09/07/2022   Influenza Split 08/28/2012, 09/05/2015   Influenza Whole 07/30/2010   Influenza, High Dose Seasonal PF 08/25/2016, 08/18/2017, 08/28/2018   Influenza,inj,Quad PF,6+ Mos 08/16/2013   Influenza-Unspecified 09/30/2023   PFIZER Comirnaty(Gray Top)Covid-19 Tri-Sucrose Vaccine 04/09/2021   PFIZER(Purple Top)SARS-COV-2 Vaccination 12/04/2019, 12/25/2019, 09/04/2020   Pneumococcal Conjugate-13 09/13/2014   Pneumococcal Polysaccharide-23 07/04/2006, 10/06/2015   Td 11/30/2003   Zoster, Live 12/12/2007        Objective:     BP (!) 146/100 (BP Location: Left Arm, Patient Position: Sitting, Cuff Size: Normal)   Pulse 75   Temp 98.5 F (36.9 C) (Oral)   Ht 5' 2 (1.575 m)   Wt 175 lb (79.4 kg)   SpO2 97%   BMI 32.01 kg/m    SpO2: 97 %  GENERAL: Well-developed, overweight woman, no acute distress, fully ambulatory, no conversational dyspnea. HEAD: Normocephalic, atraumatic.  EYES: Pupils equal, round, reactive to light.  No scleral icterus.  MOUTH: Dentition intact few chipped teeth.  Oral mucosa moist.  No thrush. NECK: Supple. No thyromegaly. Trachea midline. No JVD.  No adenopathy. PULMONARY: Good air entry bilaterally.  No adventitious sounds. CARDIOVASCULAR: S1 and S2. Regular rate and rhythm.  ABDOMEN: Obese otherwise benign. MUSCULOSKELETAL: No joint deformity, no clubbing, no edema.  NEUROLOGIC: No overt focal deficit, no gait disturbance, speech is fluent. SKIN: Intact,warm,dry. PSYCH: Mood and behavior normal.    Pulmonary function test     Status: None (Preliminary result)   Collection Time: 07/05/24  8:34 AM  Result Value Ref Range   FVC-Pre 1.97 L   FVC-%Pred-Pre 79 %   FVC-Post 2.09 L   FVC-%Pred-Post 83 %   FVC-%Change-Post 6 %   FEV1-Pre 1.61 L   FEV1-%Pred-Pre 86 %   FEV1-Post 1.67 L  FEV1-%Pred-Post 89 %   FEV1-%Change-Post 3 %   FEV6-Pre 1.97 L   FEV6-%Pred-Pre 83 %   FEV6-Post 2.09 L   FEV6-%Pred-Post 88 %   FEV6-%Change-Post 6 %   Pre FEV1/FVC ratio 82 %   FEV1FVC-%Pred-Pre 109 %   Post FEV1/FVC ratio 80 %   FEV1FVC-%Change-Post -2 %   Pre FEV6/FVC Ratio 100 %   FEV6FVC-%Pred-Pre 105 %   Post FEV6/FVC ratio 100 %   FEV6FVC-%Pred-Post 105 %   FEF 25-75 Pre 1.70 L/sec   FEF2575-%Pred-Pre 117 %   FEF 25-75 Post 2.03 L/sec   FEF2575-%Pred-Post 140 %   FEF2575-%Change-Post 19 %   RV 2.64 L   RV % pred 118 %   TLC 4.80 L   TLC % pred 100 %   DLCO unc 15.28 ml/min/mmHg   DLCO unc % pred 87 %   DL/VA 6.00 ml/min/mmHg/L   DL/VA % pred 95 %  *Results discussed with patient.       Assessment & Plan:     ICD-10-CM   1. Pulmonary emphysema, unspecified emphysema type (HCC) - MILD  J43.9     2. Chronic coughing  R05.3     3. HFrEF (heart failure with reduced  ejection fraction) (HCC)  I50.20     4. Personal history of tobacco use, presenting hazards to health  Z87.891      Discussion:    Chronic cough Chronic cough with no significant change in severity. Lung function tests are normal, indicating no major pulmonary issues. Possible medication-related cough due to Entresto . Differential diagnosis includes medication side effects and allergies, given the recent severe pollen season. - Consider trial of anti-inflammatory inhaler if cough persists - Discuss with cardiologist about potential medication-related cough - Follow up in four months to reassess cough  Tobacco use Former smoker with a history of smoking one pack per day, quit in 2016. Completed nine years of lung cancer screening, with plans to continue for another year. Recent screening results were benign. - Continue annual lung cancer screening for one more year      Advised if symptoms do not improve or worsen, to please contact office for sooner follow up or seek emergency care.    I spent 30 minutes of dedicated to the care of this patient on the date of this encounter to include pre-visit review of records, face-to-face time with the patient discussing conditions above, post visit ordering of testing, clinical documentation with the electronic health record, making appropriate referrals as documented, and communicating necessary findings to members of the patients care team.     C. Leita Sanders, MD [facility]    *This note was generated using voice recognition software/Dragon and/or AI transcription program.  Despite best efforts to proofread, errors can occur which can change the meaning. Any transcriptional errors that result from this process are unintentional and may not be fully corrected at the time of dictation.

## 2024-07-05 NOTE — Patient Instructions (Signed)
 Full PFT completed today ? ?

## 2024-07-05 NOTE — Progress Notes (Signed)
 Full PFT completed today ? ?

## 2024-07-05 NOTE — Patient Instructions (Addendum)
 VISIT SUMMARY:  You came in today because of a persistent cough that has slightly improved but is still present. We discussed your concerns about the cough possibly being related to your medication, Entresto , and your history of smoking. Your lung function tests are normal, and your recent lung cancer screening showed no concerning findings.  YOUR PLAN:  -CHRONIC COUGH: A chronic cough is a cough that lasts for a long time. Your lung function tests are additionally normal, which means there are no major issues with your lungs. The cough might be related to your medication, Entresto , or could be due to allergies. If the cough continues, we might try an anti-inflammatory inhaler. You should also discuss the possibility of a medication-related cough with your primary care physician or cardiologist.  You state however that the cough overall has improved somewhat, continue to monitor.  We will follow up in four months to see how you are doing.  -TOBACCO USE: You are a former smoker who quit in 2016 after smoking one pack per day. You have been doing lung cancer screenings for nine years, and your recent screening was normal. You should continue with your annual lung cancer screening for one more year.  INSTRUCTIONS:  Please follow up in four months to reassess your cough. In the meantime, discuss with your primary care physician about the possibility of your cough being related to your medication, Entresto . Continue your annual lung cancer screening for one more year.

## 2024-07-12 ENCOUNTER — Ambulatory Visit: Payer: Self-pay | Admitting: Pulmonary Disease

## 2024-07-23 ENCOUNTER — Other Ambulatory Visit (HOSPITAL_COMMUNITY): Payer: Self-pay | Admitting: Cardiology

## 2024-07-24 ENCOUNTER — Other Ambulatory Visit: Payer: Self-pay

## 2024-07-24 ENCOUNTER — Encounter: Payer: Self-pay | Admitting: Cardiology

## 2024-07-24 MED ORDER — DAPAGLIFLOZIN PROPANEDIOL 10 MG PO TABS: 10.0000 mg | ORAL_TABLET | Freq: Every day | ORAL | 3 refills | Status: AC

## 2024-07-31 ENCOUNTER — Encounter: Payer: Self-pay | Admitting: Family

## 2024-07-31 ENCOUNTER — Other Ambulatory Visit: Payer: Self-pay | Admitting: Family

## 2024-07-31 DIAGNOSIS — I2510 Atherosclerotic heart disease of native coronary artery without angina pectoris: Secondary | ICD-10-CM

## 2024-08-06 ENCOUNTER — Telehealth: Payer: Self-pay | Admitting: Cardiology

## 2024-08-06 NOTE — Telephone Encounter (Signed)
 Called to confirm/remind patient of their appointment [facility] on 08/07/24.   Appointment:   [x] Confirmed  [] Left mess   [] No answer/No voice mail  [] VM Full/unable to leave message  [] Phone not in service  Patient reminded to bring all medications and/or complete list.  Confirmed patient has transportation. Gave directions, instructed to utilize valet parking.

## 2024-08-07 ENCOUNTER — Ambulatory Visit: Attending: Cardiology | Admitting: Cardiology

## 2024-08-07 ENCOUNTER — Encounter: Payer: Self-pay | Admitting: Cardiology

## 2024-08-07 VITALS — BP 131/90 | HR 72 | Wt 175.4 lb

## 2024-08-07 DIAGNOSIS — Z87891 Personal history of nicotine dependence: Secondary | ICD-10-CM | POA: Diagnosis not present

## 2024-08-07 DIAGNOSIS — Z79899 Other long term (current) drug therapy: Secondary | ICD-10-CM | POA: Insufficient documentation

## 2024-08-07 DIAGNOSIS — E8770 Fluid overload, unspecified: Secondary | ICD-10-CM | POA: Diagnosis not present

## 2024-08-07 DIAGNOSIS — I5020 Unspecified systolic (congestive) heart failure: Secondary | ICD-10-CM | POA: Insufficient documentation

## 2024-08-07 DIAGNOSIS — Z7984 Long term (current) use of oral hypoglycemic drugs: Secondary | ICD-10-CM | POA: Insufficient documentation

## 2024-08-07 DIAGNOSIS — I110 Hypertensive heart disease with heart failure: Secondary | ICD-10-CM | POA: Insufficient documentation

## 2024-08-07 DIAGNOSIS — I428 Other cardiomyopathies: Secondary | ICD-10-CM | POA: Insufficient documentation

## 2024-08-07 DIAGNOSIS — I447 Left bundle-branch block, unspecified: Secondary | ICD-10-CM | POA: Insufficient documentation

## 2024-08-07 DIAGNOSIS — R053 Chronic cough: Secondary | ICD-10-CM | POA: Insufficient documentation

## 2024-08-07 DIAGNOSIS — I2510 Atherosclerotic heart disease of native coronary artery without angina pectoris: Secondary | ICD-10-CM | POA: Diagnosis not present

## 2024-08-07 MED ORDER — LOSARTAN POTASSIUM 100 MG PO TABS: 100.0000 mg | ORAL_TABLET | Freq: Every day | ORAL | 1 refills | Status: DC

## 2024-08-07 NOTE — Progress Notes (Signed)
 ADVANCED HEART FAILURE CLINIC NOTE  Referring Physician: Dineen Rollene MATSU, FNP  Primary Care: Dineen Rollene MATSU, FNP  CC: [id number]  HPI: Valerie Curry is a 78 y.o. female with heart failure with reduced ejection fraction secondary to nonischemic cardiomyopathy, left bundle branch block, nonobstructive CAD, history of tobacco use for greater than 40 years presenting today to establish care.She was diagnosed with LBBB 4-5 years ago when undergoing cholecystecomty leading to TTE showing reduced LVEF.  Today she presents for follow up.   Interval hx:  Overall she has been doing very well from a functional standpoint.  She reports no limitations due to shortness of breath, has no orthopnea, lower extremity edema, chest pressure.  Her only issue currently is persistent cough.  She has had a CT chest, completed treatment for reflux and had PFTs all of which were normal and led to no improvement in cough.  Current Outpatient Medications  Medication Sig Dispense Refill   ALLERGY RELIEF 10 MG tablet Take 10 mg by mouth daily. (Patient taking differently: Take 10 mg by mouth daily as needed for allergies.)     calcium  citrate (CALCITRATE - DOSED IN MG ELEMENTAL CALCIUM ) 950 (200 Ca) MG tablet Take 200 mg of elemental calcium  by mouth daily.     dapagliflozin  propanediol (FARXIGA ) 10 MG TABS tablet Take 1 tablet (10 mg total) by mouth daily before breakfast. 90 tablet 3   ENTRESTO  49-51 MG TAKE ONE (1) TABLET BY MOUTH TWO TIMES PER DAY 60 tablet 11   furosemide  (LASIX ) 20 MG tablet Take 1 tablet (20 mg total) by mouth daily as needed for edema or fluid. 30 tablet 3   ibuprofen  (ADVIL ) 200 MG tablet Take 200 mg by mouth every 6 (six) hours as needed.     meloxicam  (MOBIC ) 7.5 MG tablet Take 7.5 mg by mouth daily. (Patient taking differently: Take 7.5 mg by mouth daily. As needed)     metoprolol  succinate (TOPROL -XL) 25 MG 24 hr tablet TAKE 1/2 TABLET BY MOUTH ONCE DAILY 45 tablet 1   Multiple  Vitamins-Minerals (PRESERVISION AREDS 2) CAPS Take by mouth.     rosuvastatin  (CRESTOR ) 20 MG tablet TAKE ONE TABLET EACH EVENING 90 tablet 3   spironolactone  (ALDACTONE ) 25 MG tablet Take 1 tablet (25 mg total) by mouth daily. 100 tablet 2   losartan  (COZAAR ) 100 MG tablet Take 100 mg by mouth daily. (Patient not taking: Reported on 08/07/2024)     No current facility-administered medications for this visit.      PHYSICAL EXAM: Vitals:   08/07/24 1012  BP: (!) 131/90  Pulse: 72  SpO2: 100%   GENERAL: NAD Lungs- CTA CARDIAC:  JVP: 6 cm          Normal rate with regular rhythm. No murmur.  Pulses 2+. No edema.  ABDOMEN: Soft, non-tender, non-distended.  EXTREMITIES: Warm and well perfused.  NEUROLOGIC: No obvious FND   DATA REVIEW  ECG: NSR w/ LBBB, QRSd  As per my personal interpretation  ECHO: 03/31/23: LVEF 30-35%, RV function is normal  as per my personal interpretation 08/11/23: LVEF 35%-40%; grade I DD  CATH: 03/17/22: Mild, non-obstructive coronary artery disease with up to 20% stenosis involving the proximal LAD and mid RCA. Upper normal left ventricular filling pressure (LVEDP 15 mmHg).  CMR: 05/04/23 1. Normal LV size, moderately reduced LV function, LVEF 43%.  2. There is no late gadolinium enhancement in the left ventricular myocardium.  3. There is no evidence for myocardial  infiltration.  4. Normal RV size and function.  5. Findings consistent with non ischemic cardiomyopathy.  ASSESSMENT & PLAN:  Heart failure reduced ejection fraction Etiology of HF: Nonischemic cardiomyopathy, likely secondary to underlying left bundle branch block.  Cardiac MRI with no LGE.  Left heart cath from April 2023 with minimal nonobstructive CAD. NYHA class / AHA Stage: NYHA II Volume status & Diuretics: Mildly hypervolemic on exam.  Take Lasix  20 mg tomorrow and then as needed only Vasodilators: Due to a persistent cough we will transition off Entresto  for a short trial  period; transition to losartan  100mg  daily. She is concerned about a drop in LVEF. We will repeat an echo in 4-6 weeks. Follow up with pharmacy in 2 weeks for labs and med titration if necessary. Discussed with pharmD.  Beta-Blocker: continue toprol  12.5mg  daily.  MRA: Spironolactone  25 mg daily Cardiometabolic:continue farxiga  10mg  daily Devices therapies & Valvulopathies: After lengthy discussion with her she currently has no heart failure symptoms and has had an improvement in EF on cardiac MRI.  We will continue to monitor her LVEF by echocardiogram.  No indication for BiV device placement at this time; she is also not interested in any invasive procedures unless she becomes symptomatic;  She has NYHA II symptoms. D Advanced therapies: Not currently indicated see above  2. Nonobstructive CAD -Crestor  20 mg daily  3. HTN -see above; D/C Entresto , transition to Losartan  100mg  daily due to persistent cough.   4. Lightheadedness - resolved; no further BB uptitration.   5. Persistent cough  - She has now had PFTs, CT chest, trial of PPIs without improvement. Likely due to ARNI; will transition to Losartan  100mg  daily.   Valerie Curry Advanced Heart Failure Mechanical Circulatory Support  I spent 35 minutes caring for this patient today including face to face time, ordering and reviewing labs, reviewing medications, discussing weaning Entresto , seeing the patient, documenting in the record, and arranging follow ups.

## 2024-08-07 NOTE — Patient Instructions (Signed)
 Medication Changes:  STOP Entresto   START Losartan  100mg  (1 tab) daily  Lab Work:  Go over to the [facility]. Go pass the gift shop and have your blood work completed.  We will only call you if the results are abnormal or if the provider would like to make medication changes.  No news is good news.   Testing/Procedures:  Your physician has requested that you have an echocardiogram. Echocardiography is a painless test that uses sound waves to create images of your heart. It provides your doctor with information about the size and shape of your heart and how well your heart's chambers and valves are working. This procedure takes approximately one hour. There are no restrictions for this procedure. Please do NOT wear cologne, perfume, aftershave, or lotions (deodorant is allowed). Please arrive 15 minutes prior to your appointment time.  Please note: We ask at that you not bring children with you during ultrasound (echo/ vascular) testing. Due to room size and safety concerns, children are not allowed in the ultrasound rooms during exams. Our front office staff cannot provide observation of children in our lobby area while testing is being conducted. An adult accompanying a patient to their appointment will only be allowed in the ultrasound room at the discretion of the ultrasound technician under special circumstances. We apologize for any inconvenience.  This will be done at [facility]. They will give you a call in order to schedule your appointment.   Follow-Up in: Please follow up with the [facility] in 6 weeks with an echo. We do not currently have that schedule. Please give us  a call in the first of October in order to schedule your appointment.   Thank you for choosing  [facility].    At the [facility], you and your health needs are our priority. We have a designated team specialized in the  treatment of Heart Failure. This Care Team includes your primary Heart Failure Specialized Cardiologist (physician), Advanced Practice Providers (APPs- Physician Assistants and Nurse Practitioners), and Pharmacist who all work together to provide you with the care you need, when you need it.   You may see any of the following providers on your designated Care Team at your next follow up:  Dr. Toribio Fuel Dr. Ezra Shuck Dr. Ria Commander Dr. Morene Brownie Ellouise Class, FNP Jaun Bash, RPH-CPP  Please be sure to bring in all your medications bottles to every appointment.   Need to Contact Us :  If you have any questions or concerns before your next appointment please send us  a message through [place] or call our office at [phone].    TO LEAVE A MESSAGE FOR THE NURSE SELECT OPTION 2, PLEASE LEAVE A MESSAGE INCLUDING: YOUR NAME DATE OF BIRTH CALL BACK NUMBER REASON FOR CALL**this is important as we prioritize the call backs  YOU WILL RECEIVE A CALL BACK THE SAME DAY AS LONG AS YOU CALL BEFORE 4:00 PM

## 2024-08-22 ENCOUNTER — Other Ambulatory Visit

## 2024-09-03 NOTE — Progress Notes (Unsigned)
 Advanced Heart Failure Clinic Note  PCP: Dineen Rollene MATSU, FNP PCP-Cardiologist: Redell Cave, MD HF-Cardiologist: {[facility] Cardiologist:[id number]}  HPI:  Valerie Curry is a 78 y.o. female with heart failure with reduced ejection fraction secondary to nonischemic cardiomyopathy, left bundle branch block, nonobstructive CAD, history of tobacco use for greater than 40 years presenting today to establish care.She was diagnosed with LBBB 4-5 years ago when undergoing cholecystecomty leading to TTE showing reduced LVEF.  Today she presents for follow up.   Today Valerie Curry returns to Heart Failure Clinic for pharmacist medication titration. Reports feeling great. Reports {CHL AMB [facility] Symptoms:30234}. Denies {CHL AMB [facility] Symptoms:30234}. Reports being able to complete all activities of daily living (ADLs). Is pretty active throughout the day., does aerobic class 3 days per week. Takes furosemide  20 mg prn. Appetite has been good. Somewhat follows a low sodium diet.  Current Heart Failure Medications: Loop diuretic: Beta-Blocker: ACEI/ARB/ARNI: MRA: SGLT2i: Other:  Has the patient been experiencing any side effects to the medications prescribed? {yes/no:20286}  Does the patient have any problems obtaining medications due to transportation or finances? No  Understanding of regimen: Good  Understanding of indications: {CHL AMB [facility] Excellent/Good/Fair/Poor:210917262}  Potential of adherence: Good  Patient understands to avoid NSAIDs.  Patient understands to avoid decongestants.  Pertinent Lab Values: Creatinine, Ser  Date Value Ref Range Status  06/04/2024 0.78 0.40 - 1.20 mg/dL Final   BUN  Date Value Ref Range Status  06/04/2024 20 6 - 23 mg/dL Final  96/75/7974 21 8 - 27 mg/dL Final   Potassium  Date Value Ref Range Status  06/04/2024 4.1 3.5 - 5.1 mEq/L Final   Sodium  Date Value Ref Range Status  06/04/2024 140 135 - 145 mEq/L Final  02/20/2024 144 134 -  144 mmol/L Final   B Natriuretic Peptide  Date Value Ref Range Status  11/18/2023 134.1 (H) 0.0 - 100.0 pg/mL Final    Comment:    Performed at [facility], [address] [place], [place] [address]   BNP  Date Value Ref Range Status  02/20/2024 53.2 0.0 - 100.0 pg/mL Final    Comment:    [employer] ADVIA Cent[employer] XP methodology   TSH  Date Value Ref Range Status  06/04/2024 1.13 0.35 - 5.50 uIU/mL Final   DATA REVIEW   ECG: NSR w/ LBBB, QRSd  As per my personal interpretation   ECHO: 03/31/23: LVEF 30-35%, RV function is normal  as per my personal interpretation 08/11/23: LVEF 35%-40%; grade I DD   CATH: 03/17/22: Mild, non-obstructive coronary artery disease with up to 20% stenosis involving the proximal LAD and mid RCA. Upper normal left ventricular filling pressure (LVEDP 15 mmHg).   CMR: 05/04/23 1. Normal LV size, moderately reduced LV function, LVEF 43%.  2. There is no late gadolinium enhancement in the left ventricular myocardium.  3. There is no evidence for myocardial infiltration.  4. Normal RV size and function.  5. Findings consistent with non ischemic cardiomyopathy.  Vital Signs: Today's Vitals   09/05/24 1428  BP: 136/76  Pulse: 74  SpO2: 99%  Weight: 179 lb (81.2 kg)    Assessment/Plan: Heart failure reduced ejection fraction Etiology of HF: Nonischemic cardiomyopathy, likely secondary to underlying left bundle branch block.  Cardiac MRI with no LGE.  Left heart cath from April 2023 with minimal nonobstructive CAD. NYHA class / AHA Stage: NYHA II Volume status & Diuretics: Mildly hypervolemic on exam.  Take Lasix  20 mg tomorrow and  then as needed only Vasodilators: Due to a persistent cough we will transition off Entresto  for a short trial period; transition to losartan  100mg  daily. She is concerned about a drop in LVEF. We will repeat an echo in 4-6 weeks. Follow up with pharmacy in 2 weeks for labs and med titration if necessary.  Discussed with pharmD.  Beta-Blocker: Increase metoprolol  succinate 25 mg daily.  MRA: Spironolactone  25 mg daily Cardiometabolic:continue Farxiga  10mg  daily Devices therapies & Valvulopathies: After lengthy discussion with her she currently has no heart failure symptoms and has had an improvement in EF on cardiac MRI.  We will continue to monitor her LVEF by echocardiogram.  No indication for BiV device placement at this time; she is also not interested in any invasive procedures unless she becomes symptomatic;  She has NYHA II symptoms. D Advanced therapies: Not currently indicated see above   2. Nonobstructive CAD -Crestor  20 mg daily   3. HTN -see above; D/C Entresto , transition to Losartan  100mg  daily due to persistent cough.    4. Lightheadedness - resolved; no further BB uptitration.    5. Persistent cough  - She has now had PFTs, CT chest, trial of PPIs without improvement. Likely due to ARNI; will transition to Losartan  100mg  daily.   Follow up: in 1 month witd MD after Echo  ***

## 2024-09-04 ENCOUNTER — Telehealth: Payer: Self-pay | Admitting: Pharmacist

## 2024-09-04 NOTE — Telephone Encounter (Signed)
 Called to confirm/remind patient of their appointment at the [facility] on 09/05/24.   Appointment:   [x] Confirmed  [] Left mess   [] No answer/No voice mail  [] VM Full/unable to leave message  [] Phone not in service  Patient reminded to bring all medications and/or complete list.  Confirmed patient has transportation. Gave directions, instructed to utilize valet parking.

## 2024-09-05 ENCOUNTER — Ambulatory Visit: Attending: Cardiology | Admitting: Pharmacist

## 2024-09-05 VITALS — BP 136/76 | HR 74 | Wt 179.0 lb

## 2024-09-05 DIAGNOSIS — I447 Left bundle-branch block, unspecified: Secondary | ICD-10-CM | POA: Insufficient documentation

## 2024-09-05 DIAGNOSIS — I5020 Unspecified systolic (congestive) heart failure: Secondary | ICD-10-CM | POA: Diagnosis not present

## 2024-09-05 DIAGNOSIS — Z79899 Other long term (current) drug therapy: Secondary | ICD-10-CM | POA: Insufficient documentation

## 2024-09-05 DIAGNOSIS — I110 Hypertensive heart disease with heart failure: Secondary | ICD-10-CM | POA: Insufficient documentation

## 2024-09-05 DIAGNOSIS — I2510 Atherosclerotic heart disease of native coronary artery without angina pectoris: Secondary | ICD-10-CM | POA: Diagnosis not present

## 2024-09-05 DIAGNOSIS — I428 Other cardiomyopathies: Secondary | ICD-10-CM | POA: Insufficient documentation

## 2024-09-05 MED ORDER — METOPROLOL SUCCINATE ER 25 MG PO TB24: 25.0000 mg | ORAL_TABLET | Freq: Every day | ORAL | 1 refills | Status: AC

## 2024-09-05 NOTE — Patient Instructions (Signed)
 It was a pleasure seeing you today!  MEDICATIONS: -Increase your metoprolol  to 25 mg (1 tablet) by mouth once daily -Call if you have questions about your medications.  LABS: -We will call you if your labs need attention.   In general, to take care of your heart failure: -Limit your fluid intake to 2 Liters (half-gallon) per day.   -Limit your salt intake to ideally 2-3 grams (2000-3000 mg) per day. -Weigh yourself daily and record, and bring that weight diary to your next appointment.  (Weight gain of 2-3 pounds in 1 day typically means fluid weight.) -The medications for your heart are to help your heart and help you live longer.   -Please contact us  before stopping any of your heart medications.  Call the clinic at [phone] with questions or to reschedule future appointments.

## 2024-09-06 MED ORDER — LOSARTAN POTASSIUM 100 MG PO TABS: 150.0000 mg | ORAL_TABLET | Freq: Every day | ORAL | 3 refills | Status: AC

## 2024-09-14 ENCOUNTER — Ambulatory Visit: Attending: Cardiology

## 2024-09-14 DIAGNOSIS — I5020 Unspecified systolic (congestive) heart failure: Secondary | ICD-10-CM | POA: Diagnosis not present

## 2024-09-14 LAB — ECHOCARDIOGRAM COMPLETE
AR max vel: 2.02 cm2
AV Area VTI: 2.14 cm2
AV Area mean vel: 2.11 cm2
AV Mean grad: 4 mmHg
AV Peak grad: 7.8 mmHg
Ao pk vel: 1.4 m/s
Area-P 1/2: 3.77 cm2
S' Lateral: 3.2 cm

## 2024-09-20 ENCOUNTER — Encounter: Admitting: Family

## 2024-10-08 ENCOUNTER — Ambulatory Visit: Admitting: Cardiology

## 2024-11-01 ENCOUNTER — Encounter: Admitting: Cardiology

## 2024-11-19 ENCOUNTER — Encounter: Payer: Self-pay | Admitting: Pharmacist

## 2024-11-19 NOTE — Progress Notes (Signed)
 Pharmacy Quality Measure Review  This patient is appearing on a report for being at risk of failing the adherence measure for diabetes medications this calendar year.   Medication: Farxiga  10 mg  N/A - Attribution. Prescribed for non-diabetes indication.

## 2024-11-28 ENCOUNTER — Telehealth: Payer: Self-pay

## 2024-11-28 NOTE — Telephone Encounter (Signed)
 Called patient to schedule overdue follow up appt. She states that she discussed with her primary cardiologist the need to keep seeing both clinics, and that he informed her she probably didn't need to at this time.  She states she has an upcoming appt with primary cardiology in two weeks and she would like to double check with him. If he is agreeable, she will not schedule appt here. She states she will call us  to schedule an appt if she is advised to do so.  Explained to patient that she will need to have medications refills switched to primary cardiologist if she does not plan to schedule an appt here. Patient was agreeable to this, and states she is not currently out of any medication.

## 2024-12-07 ENCOUNTER — Other Ambulatory Visit: Payer: Self-pay

## 2024-12-12 ENCOUNTER — Encounter: Payer: Self-pay | Admitting: Cardiology

## 2024-12-12 ENCOUNTER — Ambulatory Visit: Attending: Cardiology | Admitting: Cardiology

## 2024-12-12 VITALS — BP 130/72 | HR 68 | Ht 62.0 in | Wt 181.6 lb

## 2024-12-12 DIAGNOSIS — I10 Essential (primary) hypertension: Secondary | ICD-10-CM | POA: Diagnosis not present

## 2024-12-12 DIAGNOSIS — I2510 Atherosclerotic heart disease of native coronary artery without angina pectoris: Secondary | ICD-10-CM

## 2024-12-12 DIAGNOSIS — I428 Other cardiomyopathies: Secondary | ICD-10-CM | POA: Diagnosis not present

## 2024-12-12 MED ORDER — FUROSEMIDE 20 MG PO TABS: 20.0000 mg | ORAL_TABLET | Freq: Every day | ORAL | 3 refills | Status: AC | PRN

## 2024-12-12 NOTE — Progress Notes (Signed)
 " Cardiology Office Note:    Date:  12/12/2024   ID:  Valerie Curry, Valerie Curry 1946/01/04, MRN [id number]  PCP:  Valerie Rollene MATSU, FNP  Cardiologist:  Valerie Cave, MD  Electrophysiologist:  None   Referring MD: Valerie Rollene MATSU, FNP   Chief Complaint  Patient presents with   Follow-up    Yearly follow up visit. Patient is doing well on today. Meds reviewed.     History of Present Illness:    Valerie Curry is a 79 y.o. female with a hx of hypertension, NICM initial EF 30 to 35%, last EF 40-45%, nonobstructive CAD (LHC 02/2022-20% RCA and LAD) , former smoker x40+ years who presents follow-up.   Being seen for nonischemic cardiomyopathy, denies chest pain or shortness of breath, denies edema.  Compliant with and tolerating medications as prescribed.  Most recent echocardiogram 10/25 showed EF 40 to 45%.  Feels well, has no concerns at this time.  Prior notes Echo 10/25 EF 40 to 45% CMR 6/24 EF 43%, no LGE. Echo 03/2023 EF 30 to 35% Left heart cath 02/2022 20% RCA and LAD stenosis Echo 02/2022 EF 30 to 35% Did not tolerate Entresto  due to itching CT chest lung cancer screening 11/2018 showed calcified plaque in LAD and RCA. 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   Past Medical History:  Diagnosis Date   Acute gastric erosion    Arthritis    Atherosclerosis of aorta 12/13/2018   Benign neoplasm of descending colon    Bursitis of hip 04/17/2020   CAD (coronary artery disease) 12/07/2018   a.) LDCT chest 12/07/2018: calcifications in the LAD and RCA territories; b.) LHC 03/17/2022: 10% o-pLAD, 15% pLAD, 20% mRCA --> med mgmt   Calculus of gallbladder without cholecystitis without obstruction 09/20/2019   Car sickness    Complication of anesthesia    a.) delayed emergence; b.) PONV   HFrEF (heart failure with reduced ejection fraction) (HCC) 02/2022   a.) TTE 03/09/2022: EF 30-35%, glob HK, mild LVH, mild LV dil, G1DD; b.) TTE 03/31/2023: EF 30-35%, glob HK,  mild LV dil, G1DD, RVSP 35.2, mild MR/PR; c.) cMRI 05/04/2023: EF 43%; d.) TTE 08/11/2023: EF 35-40%, glob HK, mild LV dil, G1DD, RVSP 30.8, triv AR   History of bilateral cataract extraction 2024   History of hiatal hernia    HLD (hyperlipidemia) 11/17/2018   Hypertension    Left bundle branch block    Lump or mass in breast (benign) 2011/2012   Multinodular thyroid  12/30/2020   NICM (nonischemic cardiomyopathy) (HCC)    a.) TTE 03/09/2022: EF 30-35%; b.) TTE 03/31/2023: EF 30-35%; c.) cMRI 05/04/2023: EF 43%, no LGE to suggest ischemia/infarct/scar/infiltrative disease;  d.) TTE 08/11/2023: EF 35-40%   Obesity, unspecified    Osteopenia    a.) on oral bisphosphonate (Boniva) x 1.5 years; declines continued therapy   Osteoporosis 06/27/2007   Personal history of tobacco use, presenting hazards to health    PONV (postoperative nausea and vomiting)    Positive PPD as a child    a.) diagnosic radiographs of the chest were negative; not consistent with findings of TB   Spinal stenosis, lumbar region with neurogenic claudication 04/30/2015   Systemic sclerosis (HCC)    Umbilical hernia without obstruction and without gangrene    Uterine prolapse 11/17/2018    Past Surgical History:  Procedure Laterality Date   ANTERIOR AND POSTERIOR REPAIR N/A 01/23/2024   Procedure: ANTERIOR (CYSTOCELE) AND POSTERIOR REPAIR (RECTOCELE);  Surgeon: Janit Lenis  Lynwood, MD;  Location: [facility];  Service: Gynecology;  Laterality: N/A;   APPENDECTOMY  1961   BLADDER SUSPENSION N/A 01/23/2024   Procedure: TOT;  Surgeon: Janit Alm Lynwood, MD;  Location: [facility];  Service: Gynecology;  Laterality: N/A;   BREAST BIOPSY Right 09/2010   benign   BREAST BIOPSY Left 09/2011   dilated ducts-benign   BUNIONECTOMY Bilateral 2010   x 2   CATARACT EXTRACTION W/PHACO Right 06/21/2023   Procedure: CATARACT EXTRACTION PHACO AND INTRAOCULAR LENS PLACEMENT (IOC) RIGHT 21.59 01:32.9;  Surgeon: Jaye Fallow, MD;   Location: [facility];  Service: Ophthalmology;  Laterality: Right;   CATARACT EXTRACTION W/PHACO Left 07/05/2023   Procedure: CATARACT EXTRACTION PHACO AND INTRAOCULAR LENS PLACEMENT (IOC) LEFT 14.23 1:08.3;  Surgeon: Jaye Fallow, MD;  Location: [facility];  Service: Ophthalmology;  Laterality: Left;   CHOLECYSTECTOMY N/A 10/03/2019   Procedure: LAPAROSCOPIC CHOLECYSTECTOMY;  Surgeon: Marolyn Nest, MD;  Location: [facility];  Service: General;  Laterality: N/A;   COLONOSCOPY  06/2004   Dr. Viktoria   COLONOSCOPY WITH PROPOFOL  N/A 01/10/2019   Procedure: COLONOSCOPY WITH PROPOFOL ;  Surgeon: Janalyn Keene NOVAK, MD;  Location: [facility];  Service: Endoscopy;  Laterality: N/A;   DECOMPRESSIVE LUMBAR LAMINECTOMY LEVEL 2 N/A 04/30/2015   Procedure: CENTRAL DECOMPRESSIVE LUMBER LAMINECTOMY WITH POSSIBLE FORAMINOTOMIES L3-L4,L4-L5;  Surgeon: Tanda Heading, MD;  Location: [facility];  Service: Orthopedics;  Laterality: N/A;   ERCP W/ SPHICTEROTOMY     ESOPHAGOGASTRODUODENOSCOPY (EGD) WITH PROPOFOL  N/A 04/07/2021   Procedure: ESOPHAGOGASTRODUODENOSCOPY (EGD) WITH PROPOFOL ;  Surgeon: Janalyn Keene NOVAK, MD;  Location: [facility];  Service: Endoscopy;  Laterality: N/A;   LAPAROSCOPIC VAGINAL HYSTERECTOMY WITH SALPINGO OOPHORECTOMY N/A 01/23/2024   Procedure: LAPAROSCOPIC ASSISTED VAGINAL HYSTERECTOMY WITH BILATERAL SALPINGO OOPHORECTOMY, A & P REPAIR TOT;  Surgeon: Janit Alm Lynwood, MD;  Location: [facility];  Service: Gynecology;  Laterality: N/A;   LEFT HEART CATH AND CORONARY ANGIOGRAPHY Left 03/17/2022   Procedure: LEFT HEART CATH AND CORONARY ANGIOGRAPHY;  Surgeon: Mady Bruckner, MD;  Location: [facility];  Service: Cardiovascular;  Laterality: Left;   POLYPECTOMY  04/07/2021   Procedure: POLYPECTOMY;  Surgeon: Janalyn Keene NOVAK, MD;  Location: [facility];  Service: Endoscopy;;   TONSILLECTOMY  1968   TUBAL LIGATION  1974    Current  Medications: Current Meds  Medication Sig   ALLERGY RELIEF 10 MG tablet Take 10 mg by mouth daily.   calcium  citrate (CALCITRATE - DOSED IN MG ELEMENTAL CALCIUM ) 950 (200 Ca) MG tablet Take 200 mg of elemental calcium  by mouth daily.   Collagen Hydrolysate POWD by Does not apply route.   dapagliflozin  propanediol (FARXIGA ) 10 MG TABS tablet Take 1 tablet (10 mg total) by mouth daily before breakfast.   ibuprofen  (ADVIL ) 200 MG tablet Take 200 mg by mouth every 6 (six) hours as needed.   losartan  (COZAAR ) 100 MG tablet Take 1.5 tablets (150 mg total) by mouth daily.   meloxicam  (MOBIC ) 7.5 MG tablet Take 7.5 mg by mouth daily.   metoprolol  succinate (TOPROL -XL) 25 MG 24 hr tablet Take 1 tablet (25 mg total) by mouth daily.   Multiple Vitamins-Minerals (PRESERVISION AREDS 2) CAPS Take by mouth.   rosuvastatin  (CRESTOR ) 20 MG tablet TAKE ONE TABLET EACH EVENING   spironolactone  (ALDACTONE ) 25 MG tablet Take 1 tablet (25 mg total) by mouth daily.   [DISCONTINUED] furosemide  (LASIX ) 20 MG tablet Take 1 tablet (20 mg total) by mouth daily as needed for edema  or fluid.     Allergies:   Patient has no known allergies.   Social History   Socioeconomic History   Marital status: Married    Spouse name: Joe   Number of children: 2   Years of education: 15   Highest education level: Associate degree: academic program  Occupational History   Occupation: Geophysical Data Processor: [employer]  Tobacco Use   Smoking status: Former    Current packs/day: 0.00    Average packs/day: 1 pack/day for 50.0 years (50.0 ttl pk-yrs)    Types: Cigarettes    Start date: 11/29/1964    Quit date: 11/29/2014    Years since quitting: 10.0   Smokeless tobacco: Former    Quit date: 04/30/2015  Vaping Use   Vaping status: Never Used  Substance and Sexual Activity   Alcohol use: Yes    Comment: occasional glass of wine   Drug use: No   Sexual activity: Not Currently    Birth control/protection:  Post-menopausal, Surgical    Comment: btl  Other Topics Concern   Not on file  Social History Narrative   Pt is 79 yo female. Pt was born in [place], moved to [place] when she was 32.  Pt is married to husband of 22 years.  Pt has 2 sons from a previous marriage and 4 step sons and 3 Grandchildren.        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        Pt works out at gym 3/week (2 days a week in a class, 1 day a week with an comptroller).  Pt enjoys reading, spending time with her family. Pt and her husband are members of [employer] and active in their church.    Social Drivers of Health   Tobacco Use: Medium Risk (12/12/2024)   Patient History    Smoking Tobacco Use: Former    Smokeless Tobacco Use: Former    Passive Exposure: Not on Actuary Strain: Low Risk (05/31/2024)   Overall Financial Resource Strain (CARDIA)    Difficulty of Paying Living Expenses: Not hard at all  Food Insecurity: No Food Insecurity (05/31/2024)   Epic    Worried About Programme Researcher, Broadcasting/film/video in the Last Year: Never true    Ran Out of Food in the Last Year: Never true  Transportation Needs: No Transportation Needs (05/31/2024)   Epic    Lack of Transportation (Medical): No    Lack of Transportation (Non-Medical): No  Physical Activity: Sufficiently Active (05/31/2024)   Exercise Vital Sign    Days of Exercise per Week: 3 days    Minutes of Exercise per Session: 60 min  Stress: No Stress Concern Present (05/31/2024)   [employer] of Occupational Health - Occupational Stress Questionnaire    Feeling of Stress: Only a little  Social Connections: Socially Integrated (05/31/2024)   Social Connection and Isolation Panel    Frequency of Communication with Friends and Family: More than three times a week    Frequency of Social Gatherings with Friends and Family: Twice a week    Attends Religious Services: More than 4 times per year    Active  Member of Clubs or Organizations: Yes    Attends Banker Meetings: More than 4 times per year    Marital Status: Married  Depression (PHQ2-9): Low Risk (06/04/2024)   Depression (PHQ2-9)    PHQ-2 Score: 2  Alcohol Screen: Low Risk (05/31/2024)   Alcohol Screen    Last Alcohol Screening Score (AUDIT): 2  Housing: Low Risk (05/31/2024)   Epic    Unable to Pay for Housing in the Last Year: No    Number of Times Moved in the Last Year: 0    Homeless in the Last Year: No  Utilities: Not on file  Health Literacy: Not on file     Family History: The patient's family history includes Breast cancer in her cousin; Breast cancer (age of onset: 36) in her maternal aunt and paternal aunt; Cancer in her maternal aunt; Heart disease in her mother; Heart disease (age of onset: 1) in her father; Hypertension in her mother; Lung cancer in her father; Other in her brother and another family member; Stroke in her mother.  ROS:   Please see the history of present illness.     All other systems reviewed and are negative.  EKGs/Labs/Other Studies Reviewed:    The following studies were reviewed today:  Recent Labs: 01/17/2024: Hemoglobin 13.2; Platelets 203 02/20/2024: BNP 53.2 06/04/2024: ALT 12; BUN 20; Creatinine, Ser 0.78; Potassium 4.1; Sodium 140; TSH 1.13  Recent Lipid Panel    Component Value Date/Time   CHOL 139 06/04/2024 1315   CHOL 149 10/01/2021 0823   TRIG 102.0 06/04/2024 1315   HDL 63.30 06/04/2024 1315   HDL 67 10/01/2021 0823   CHOLHDL 2 06/04/2024 1315   VLDL 20.4 06/04/2024 1315   LDLCALC 55 06/04/2024 1315   LDLCALC 70 10/01/2021 0823   LDLDIRECT 100.2 07/30/2008 1017    Physical Exam:    VS:  BP 130/72 (BP Location: Left Arm, Patient Position: Sitting, Cuff Size: Large)   Pulse 68   Ht 5' 2 (1.575 m)   Wt 181 lb 9.6 oz (82.4 kg)   SpO2 96%   BMI 33.22 kg/m     Wt Readings from Last 3 Encounters:  12/12/24 181 lb 9.6 oz (82.4 kg)  09/05/24 179 lb (81.2  kg)  08/07/24 175 lb 6.4 oz (79.6 kg)     GEN:  Well nourished, well developed in no acute distress HEENT: Normal NECK: No JVD; No carotid bruits CARDIAC: RRR, no murmurs, rubs, gallops RESPIRATORY:  Clear to auscultation without rales, wheezing or rhonchi  ABDOMEN: Soft, non-tender, non-distended MUSCULOSKELETAL:  No edema; No deformity  SKIN: Warm and dry NEUROLOGIC:  Alert and oriented x 3 PSYCHIATRIC:  Normal affect   ASSESSMENT:    1. Coronary artery disease involving native coronary artery of native heart without angina pectoris   2. NICM (nonischemic cardiomyopathy) (HCC)   3. Primary hypertension    PLAN:    In order of problems listed above:  Nonobstructive CAD, (LHC 02/2022- 20% proximal LAD and mid RCA ).  Continue aspirin, Crestor  20 mg daily.  LDL at goal.   Nonischemic cardiomyopathy, EF 30 to 35%.  Echo 10/25 EF 40-45%.  CMR 04/2023 EF 43%.    Appears euvolemic.  NYHA class II symptoms.  Etiology possibly left bundle branch block cardiomyopathy, continue Toprol -XL 25 mg daily, losartan  100 mg daily, Aldactone  25 mg daily, Farxiga  10 mg daily, Lasix  20 mg daily as needed.  Repeat echo in 1 year. Hypertension, BP Controlled.  Continue carvedilol , Entresto , Aldactone .  Follow-up in 12  months  Medication Adjustments/Labs and Tests Ordered: Current medicines are reviewed at length with the patient today.  Concerns regarding medicines are outlined above.  Orders Placed This Encounter  Procedures   EKG 12-Lead  ECHOCARDIOGRAM COMPLETE   Meds ordered this encounter  Medications   furosemide  (LASIX ) 20 MG tablet    Sig: Take 1 tablet (20 mg total) by mouth daily as needed for edema or fluid.    Dispense:  30 tablet    Refill:  3    Patient Instructions  Medication Instructions:  Your physician recommends that you continue on your current medications as directed. Please refer to the Current Medication list given to you today.   *If you need a refill on your  cardiac medications before your next appointment, please call your pharmacy*  Lab Work: No labs ordered today  If you have labs (blood work) drawn today and your tests are completely normal, you will receive your results only by: MyChart Message (if you have MyChart) OR A paper copy in the mail If you have any lab test that is abnormal or we need to change your treatment, we will call you to review the results.  Testing/Procedures: Your physician has requested that you have an echocardiogram in 6 months. Echocardiography is a painless test that uses sound waves to create images of your heart. It provides your doctor with information about the size and shape of your heart and how well your hearts chambers and valves are working.   You may receive an ultrasound enhancing agent through an IV if needed to better visualize your heart during the echo. This procedure takes approximately one hour.  There are no restrictions for this procedure.  This will take place at [address] [facility] [address], [place] [address]  Please note: We ask at that you not bring children with you during ultrasound (echo/ vascular) testing. Due to room size and safety concerns, children are not allowed in the ultrasound rooms during exams. Our front office staff cannot provide observation of children in our lobby area while testing is being conducted. An adult accompanying a patient to their appointment will only be allowed in the ultrasound room at the discretion of the ultrasound technician under special circumstances. We apologize for any inconvenience.   Follow-Up: At [facility], you and your health needs are our priority.  As part of our continuing mission to provide you with exceptional heart care, our providers are all part of one team.  This team includes your primary Cardiologist (physician) and Advanced Practice Providers or APPs (Physician Assistants and Nurse Practitioners) who all  work together to provide you with the care you need, when you need it.  Your next appointment:   1 year(s)  Provider:   You may see Valerie Cave, MD or one of the following Advanced Practice Providers on your designated Care Team:   Lonni Meager, NP Lesley Maffucci, PA-C Bernardino Bring, PA-C Cadence [place], PA-C Tylene Lunch, NP Barnie Hila, NP    We recommend signing up for the patient portal called MyChart.  Sign up information is provided on this After Visit Summary.  MyChart is used to connect with patients for Virtual Visits (Telemedicine).  Patients are able to view lab/test results, encounter notes, upcoming appointments, etc.  Non-urgent messages can be sent to your provider as well.   To learn more about what you can do with MyChart, go to [email or web address].               Signed, Valerie Cave, MD  12/12/2024 12:07 PM    [facility] "

## 2024-12-12 NOTE — Patient Instructions (Addendum)
 Medication Instructions:  Your physician recommends that you continue on your current medications as directed. Please refer to the Current Medication list given to you today.   *If you need a refill on your cardiac medications before your next appointment, please call your pharmacy*  Lab Work: No labs ordered today  If you have labs (blood work) drawn today and your tests are completely normal, you will receive your results only by: MyChart Message (if you have MyChart) OR A paper copy in the mail If you have any lab test that is abnormal or we need to change your treatment, we will call you to review the results.  Testing/Procedures: Your physician has requested that you have an echocardiogram in 6 months. Echocardiography is a painless test that uses sound waves to create images of your heart. It provides your doctor with information about the size and shape of your heart and how well your heart's chambers and valves are working.   You may receive an ultrasound enhancing agent through an IV if needed to better visualize your heart during the echo. This procedure takes approximately one hour.  There are no restrictions for this procedure.  This will take place at [address] [facility] [address]) [address], [place] [address]  Please note: We ask at that you not bring children with you during ultrasound (echo/ vascular) testing. Due to room size and safety concerns, children are not allowed in the ultrasound rooms during exams. Our front office staff cannot provide observation of children in our lobby area while testing is being conducted. An adult accompanying a patient to their appointment will only be allowed in the ultrasound room at the discretion of the ultrasound technician under special circumstances. We apologize for any inconvenience.   Follow-Up: At [facility], you and your health needs are our priority.  As part of our continuing mission to provide you with  exceptional heart care, our providers are all part of one team.  This team includes your primary Cardiologist (physician) and Advanced Practice Providers or APPs (Physician Assistants and Nurse Practitioners) who all work together to provide you with the care you need, when you need it.  Your next appointment:   1 year(s)  Provider:   You may see Redell Cave, MD or one of the following Advanced Practice Providers on your designated Care Team:   Lonni Meager, NP Lesley Maffucci, PA-C Bernardino Bring, PA-C Cadence [place], PA-C Tylene Lunch, NP Barnie Hila, NP    We recommend signing up for the patient portal called MyChart.  Sign up information is provided on this After Visit Summary.  MyChart is used to connect with patients for Virtual Visits (Telemedicine).  Patients are able to view lab/test results, encounter notes, upcoming appointments, etc.  Non-urgent messages can be sent to your provider as well.   To learn more about what you can do with MyChart, go to [email or web address].

## 2024-12-18 ENCOUNTER — Encounter: Payer: Self-pay | Admitting: Cardiology

## 2025-06-06 ENCOUNTER — Encounter: Admitting: Family

## 2025-12-05 ENCOUNTER — Ambulatory Visit
